# Patient Record
Sex: Female | Born: 1950
Health system: Southern US, Community
[De-identification: ages and names within clinical notes are randomized; demographics above are authoritative.]

## PROBLEM LIST (undated history)

## (undated) ENCOUNTER — Ambulatory Visit

## (undated) DIAGNOSIS — R0600 Dyspnea, unspecified: Secondary | ICD-10-CM

## (undated) DIAGNOSIS — E669 Obesity, unspecified: Secondary | ICD-10-CM

## (undated) DIAGNOSIS — D649 Anemia, unspecified: Secondary | ICD-10-CM

## (undated) DIAGNOSIS — M2550 Pain in unspecified joint: Secondary | ICD-10-CM

## (undated) DIAGNOSIS — I10 Essential (primary) hypertension: Secondary | ICD-10-CM

## (undated) DIAGNOSIS — C801 Malignant (primary) neoplasm, unspecified: Secondary | ICD-10-CM

## (undated) DIAGNOSIS — R002 Palpitations: Secondary | ICD-10-CM

## (undated) DIAGNOSIS — I38 Endocarditis, valve unspecified: Secondary | ICD-10-CM

## (undated) DIAGNOSIS — I4891 Unspecified atrial fibrillation: Secondary | ICD-10-CM

## (undated) DIAGNOSIS — E785 Hyperlipidemia, unspecified: Secondary | ICD-10-CM

## (undated) DIAGNOSIS — R600 Localized edema: Secondary | ICD-10-CM

## (undated) HISTORY — DX: Hyperlipidemia, unspecified: E78.5

## (undated) HISTORY — DX: Localized edema: R60.0

## (undated) HISTORY — DX: Unspecified atrial fibrillation: I48.91

## (undated) HISTORY — DX: Endocarditis, valve unspecified: I38

## (undated) HISTORY — DX: Palpitations: R00.2

## (undated) HISTORY — DX: Obesity, unspecified: E66.9

## (undated) HISTORY — DX: Anemia, unspecified: D64.9

## (undated) HISTORY — DX: Dyspnea, unspecified: R06.00

## (undated) HISTORY — DX: Pain in unspecified joint: M25.50

---

## 1984-03-19 HISTORY — PX: RADICAL ABDOMINAL HYSTERECTOMY: SUR659

## 2001-05-01 ENCOUNTER — Other Ambulatory Visit: Admission: RE | Admit: 2001-05-01 | Discharge: 2001-05-01 | Payer: Self-pay | Admitting: Obstetrics and Gynecology

## 2002-06-23 ENCOUNTER — Other Ambulatory Visit: Admission: RE | Admit: 2002-06-23 | Discharge: 2002-06-23 | Payer: Self-pay | Admitting: Obstetrics and Gynecology

## 2003-02-24 ENCOUNTER — Encounter: Admission: RE | Admit: 2003-02-24 | Discharge: 2003-02-24 | Payer: Self-pay | Admitting: Family Medicine

## 2003-07-21 ENCOUNTER — Other Ambulatory Visit: Admission: RE | Admit: 2003-07-21 | Discharge: 2003-07-21 | Payer: Self-pay | Admitting: Obstetrics and Gynecology

## 2003-11-01 ENCOUNTER — Ambulatory Visit (HOSPITAL_COMMUNITY): Admission: RE | Admit: 2003-11-01 | Discharge: 2003-11-01 | Payer: Self-pay | Admitting: Gastroenterology

## 2004-06-27 ENCOUNTER — Encounter: Admission: RE | Admit: 2004-06-27 | Discharge: 2004-06-27 | Payer: Self-pay | Admitting: Family Medicine

## 2004-08-15 ENCOUNTER — Other Ambulatory Visit: Admission: RE | Admit: 2004-08-15 | Discharge: 2004-08-15 | Payer: Self-pay | Admitting: Obstetrics and Gynecology

## 2004-11-23 ENCOUNTER — Encounter: Admission: RE | Admit: 2004-11-23 | Discharge: 2004-11-23 | Payer: Self-pay | Admitting: Family Medicine

## 2005-08-16 ENCOUNTER — Other Ambulatory Visit: Admission: RE | Admit: 2005-08-16 | Discharge: 2005-08-16 | Payer: Self-pay | Admitting: Obstetrics & Gynecology

## 2006-11-14 ENCOUNTER — Other Ambulatory Visit: Admission: RE | Admit: 2006-11-14 | Discharge: 2006-11-14 | Payer: Self-pay | Admitting: Obstetrics & Gynecology

## 2007-11-28 ENCOUNTER — Other Ambulatory Visit: Admission: RE | Admit: 2007-11-28 | Discharge: 2007-11-28 | Payer: Self-pay | Admitting: Obstetrics & Gynecology

## 2009-09-14 ENCOUNTER — Encounter: Admission: RE | Admit: 2009-09-14 | Discharge: 2009-09-21 | Payer: Self-pay | Admitting: Family Medicine

## 2010-08-04 NOTE — Op Note (Signed)
NAME:  Sav[place] Hill, Savannah Hill                      ACCOUNT NO.:  [id number]   MEDICAL RECORD NO.:  [id number]                   PATIENT TYPE:  AMB   LOCATION:  ENDO                                 FACILITY:  [facility]   PHYSICIAN:  Anselmo Rod, M.D.               DATE OF BIRTH:  Oct 05, 1950   DATE OF PROCEDURE:  11/01/2003  DATE OF DISCHARGE:                                 OPERATIVE REPORT   PROCEDURE:  Screening colonoscopy.   ENDOSCOPIST:  Anselmo Rod, M.D.   INSTRUMENT:  Olympus video colonoscope.   INDICATIONS FOR PROCEDURE:  A 60 year old white female with a personal  history of colon cancer and a family history of colon cancer, underwent  screening colonoscopy to rule out colonic polyps, masses, etc.   PRE-PROCEDURE PREPARATION:  Informed consent was procured from the patient.  Patient fasted for 8 hours prior to the procedure and prepped with a bottle  of magnesium citrate and a gallon of NuLytely the night prior to the  procedure.  Pre-procedure physical:  Patient had stable vital signs, neck  supple, chest clear to auscultation, S1/S2 regular, abdomen soft with normal  bowel sounds.   DESCRIPTION OF PROCEDURE:  The patient was placed in the left lateral  decubitus position, sedated with 80 mg of Demerol and 8 mg of Versed in  slow, incremental doses. Once the patient was adequately sedated and  maintained on low flow oxygen and continuous cardiac monitoring; the Olympus  video colonoscope was advanced from the rectum to the cecum.  The  appendiceal orifice and the ileocecal valve were clearly visualized and  photographed.  There was some residual stool in the colon, especially on the  right side.  Multiple washings were done.  No masses, polyps, erosions, diverticula or ulcerations were seen.  Small  internal hemorrhoids were appreciated on retroflexion in the rectum. Small  lesions could have been missed secondary to residual stool.   IMPRESSION:  1. Unrevealing  colonoscopy up to the cecum, except for small internal     hemorrhoids.  2. No masses, polyps or diverticula seen.  3. Significant amount of residual stool in the right colon, multiple     washings done.  Small lesions could have been missed.   RECOMMENDATIONS:  1. A repeat colonoscopy will therefore be done in an earlier date than 5     years unless the patient develops any abnormal symptoms in the interim.  2. Continue a high fiber diet with liberal fluids intake.  3. Outpatient follow up as needs arise in the future.  The patient has been     advised to report any abnormal GI bleeding and change in bowel habits,     weight loss, etc., to the office at the earliest.  Anselmo Rod, M.D.    JNM/MEDQ  D:  11/01/2003  T:  11/01/2003  Job:  [id number]   cc:   Bryan Lemma. Manus Gunning, M.D.  [address] [place]  [place] [address]  Fax: [phone]   Edwena Felty. Romine, M.D.  [address]  [address] [place]  [place] [address]  Fax: [phone]

## 2011-03-08 ENCOUNTER — Emergency Department (HOSPITAL_COMMUNITY): Payer: Commercial Indemnity

## 2011-03-08 ENCOUNTER — Encounter: Payer: Self-pay | Admitting: Emergency Medicine

## 2011-03-08 ENCOUNTER — Other Ambulatory Visit: Payer: Self-pay

## 2011-03-08 ENCOUNTER — Emergency Department (HOSPITAL_COMMUNITY)
Admission: EM | Admit: 2011-03-08 | Discharge: 2011-03-08 | Disposition: A | Discharge status: Home / Self Care | Payer: Commercial Indemnity | Attending: Emergency Medicine | Admitting: Emergency Medicine

## 2011-03-08 DIAGNOSIS — R079 Chest pain, unspecified: Secondary | ICD-10-CM | POA: Insufficient documentation

## 2011-03-08 DIAGNOSIS — I10 Essential (primary) hypertension: Secondary | ICD-10-CM | POA: Insufficient documentation

## 2011-03-08 DIAGNOSIS — Z79899 Other long term (current) drug therapy: Secondary | ICD-10-CM | POA: Insufficient documentation

## 2011-03-08 HISTORY — DX: Malignant (primary) neoplasm, unspecified: C80.1

## 2011-03-08 HISTORY — DX: Essential (primary) hypertension: I10

## 2011-03-08 LAB — DIFFERENTIAL
Basophils Absolute: 0 10*3/uL (ref 0.0–0.1)
Basophils Relative: 0 % (ref 0–1)
Eosinophils Absolute: 0.1 10*3/uL (ref 0.0–0.7)
Eosinophils Relative: 1 % (ref 0–5)
Lymphocytes Relative: 14 % (ref 12–46)
Lymphs Abs: 1.4 10*3/uL (ref 0.7–4.0)
Monocytes Absolute: 1.1 10*3/uL — ABNORMAL HIGH (ref 0.1–1.0)
Monocytes Relative: 10 % (ref 3–12)
Neutro Abs: 8 10*3/uL — ABNORMAL HIGH (ref 1.7–7.7)
Neutrophils Relative %: 75 % (ref 43–77)

## 2011-03-08 LAB — CBC
HCT: 38.6 % (ref 36.0–46.0)
Hemoglobin: 13 g/dL (ref 12.0–15.0)
MCH: 31.5 pg (ref 26.0–34.0)
MCHC: 33.7 g/dL (ref 30.0–36.0)
MCV: 93.5 fL (ref 78.0–100.0)
Platelets: 312 10*3/uL (ref 150–400)
RBC: 4.13 MIL/uL (ref 3.87–5.11)
RDW: 12.4 % (ref 11.5–15.5)
WBC: 10.6 10*3/uL — ABNORMAL HIGH (ref 4.0–10.5)

## 2011-03-08 LAB — D-DIMER, QUANTITATIVE: D-Dimer, Quant: 0.51 ug/mL-FEU — ABNORMAL HIGH (ref 0.00–0.48)

## 2011-03-08 LAB — COMPREHENSIVE METABOLIC PANEL
ALT: 6 U/L (ref 0–35)
AST: 20 U/L (ref 0–37)
Albumin: 4.1 g/dL (ref 3.5–5.2)
Alkaline Phosphatase: 67 U/L (ref 39–117)
BUN: 16 mg/dL (ref 6–23)
CO2: 26 mEq/L (ref 19–32)
Calcium: 9.6 mg/dL (ref 8.4–10.5)
Chloride: 98 mEq/L (ref 96–112)
Creatinine, Ser: 0.85 mg/dL (ref 0.50–1.10)
GFR calc Af Amer: 85 mL/min — ABNORMAL LOW (ref >90)
GFR calc non Af Amer: 73 mL/min — ABNORMAL LOW (ref >90)
Glucose, Bld: 88 mg/dL (ref 70–99)
Potassium: 3.5 mEq/L (ref 3.5–5.1)
Sodium: 136 mEq/L (ref 135–145)
Total Bilirubin: 0.3 mg/dL (ref 0.3–1.2)
Total Protein: 7.6 g/dL (ref 6.0–8.3)

## 2011-03-08 LAB — CARDIAC PANEL(CRET KIN+CKTOT+MB+TROPI)
CK, MB: 2.2 ng/mL (ref 0.3–4.0)
Relative Index: 1.1 (ref 0.0–2.5)
Total CK: 192 U/L — ABNORMAL HIGH (ref 7–177)
Troponin I: <0.3 ng/mL (ref <0.30)

## 2011-03-08 MED ORDER — SODIUM CHLORIDE 0.9 % IV SOLN: Freq: Once | INTRAVENOUS | Status: DC

## 2011-03-08 MED ORDER — METHOCARBAMOL 500 MG PO TABS: 500.0000 mg | ORAL_TABLET | Freq: Two times a day (BID) | ORAL | Status: AC

## 2011-03-08 MED ORDER — ASPIRIN 325 MG PO TABS
ORAL_TABLET | ORAL | Status: AC
  Administered 2011-03-08: 325 mg via OROMUCOSAL
  Filled 2011-03-08: qty 1

## 2011-03-08 MED ORDER — IBUPROFEN 600 MG PO TABS: 600.0000 mg | ORAL_TABLET | Freq: Four times a day (QID) | ORAL | Status: AC | PRN

## 2011-03-08 MED ORDER — IOHEXOL 300 MG/ML  SOLN
100.0000 mL | Freq: Once | INTRAMUSCULAR | Status: AC | PRN
  Administered 2011-03-08: 80 mL via INTRAVENOUS

## 2011-03-08 MED ORDER — ASPIRIN EC 325 MG PO TBEC
325.0000 mg | DELAYED_RELEASE_TABLET | Freq: Once | ORAL | Status: DC
  Filled 2011-03-08: qty 1

## 2011-03-08 NOTE — ED Notes (Signed)
Family at bedside. 

## 2011-03-08 NOTE — ED Notes (Signed)
Patient transported to CT 

## 2011-03-08 NOTE — ED Notes (Signed)
Patient denies pain and is resting comfortably.  

## 2011-03-08 NOTE — ED Notes (Signed)
Patient is resting comfortably. 

## 2011-03-08 NOTE — ED Notes (Signed)
Patient transported to X-ray 

## 2011-03-08 NOTE — ED Provider Notes (Signed)
History     CSN: [id number]  Arrival date & time 03/08/11  1629   First MD Initiated Contact with Patient 03/08/11 1654      No chief complaint on file.   (Consider location/radiation/quality/duration/timing/severity/associated sxs/prior treatment) The history is provided by the patient.   patient here with chest pain since 11:00 this morning. Substernal in nature has been constant. Worse with certain movements as well as the deep breathing. Denies any associated dyspnea, diaphoresis, cough, fever. None no exertional component to her symptoms. Denies any leg pain or swelling. No prior history of this in the past patient did take Xanax thinking that her symptoms were due to stress and there have been only for symptoms. Patient did exercise today and thinks that maybe the tightness was made worse by that however she was able to finish her workout and denied any associated dyspnea or diaphoresis with this.  Past Medical History  Diagnosis Date  . Hypertension   . Cancer     Past Surgical History  Procedure Date  . Abdominal hysterectomy     Family History  Problem Relation Age of Onset  . Diabetes Mother   . Hypertension Mother     History  Substance Use Topics  . Smoking status: Never Smoker   . Smokeless tobacco: Not on file  . Alcohol Use: Yes    OB History    Grav Para Term Preterm Abortions TAB SAB Ect Mult Living                  Review of Systems  All other systems reviewed and are negative.    Allergies  Review of patient's allergies indicates no known allergies.  Home Medications   Current Outpatient Rx  Name Route Sig Dispense Refill  . CONJ ESTROG-MEDROXYPROGEST ACE 0.625-2.5 MG PO TABS Oral Take 1 tablet by mouth daily.      [name] Kitchen HYDROCHLOROTHIAZIDE 12.5 MG PO CAPS Oral Take 12.5 mg by mouth daily.        BP 136/78  Pulse 67  Temp(Src) 97.6 F (36.4 C) (Oral)  Resp 18  SpO2 100%  Physical Exam  Nursing note and vitals  reviewed. Constitutional: She is oriented to person, place, and time. She appears well-developed and well-nourished.  Non-toxic appearance. No distress.  HENT:  Head: Normocephalic and atraumatic.  Eyes: Conjunctivae, EOM and lids are normal. Pupils are equal, round, and reactive to light.  Neck: Normal range of motion. Neck supple. No tracheal deviation present. No mass present.  Cardiovascular: Normal rate, regular rhythm and normal heart sounds.  Exam reveals no gallop.   No murmur heard. Pulmonary/Chest: Effort normal and breath sounds normal. No stridor. No respiratory distress. She has no decreased breath sounds. She has no wheezes. She has no rhonchi. She has no rales. She exhibits tenderness and bony tenderness. She exhibits no crepitus.    Abdominal: Soft. Normal appearance and bowel sounds are normal. She exhibits no distension. There is no tenderness. There is no rebound and no CVA tenderness.  Musculoskeletal: Normal range of motion. She exhibits no edema and no tenderness.  Neurological: She is alert and oriented to person, place, and time. She has normal strength. No cranial nerve deficit or sensory deficit. GCS eye subscore is 4. GCS verbal subscore is 5. GCS motor subscore is 6.  Skin: Skin is warm and dry. No abrasion and no rash noted.  Psychiatric: She has a normal mood and affect. Her speech is normal and behavior is normal.  ED Course  Procedures (including critical care time)   Labs Reviewed  CBC  DIFFERENTIAL  COMPREHENSIVE METABOLIC PANEL  CARDIAC PANEL(CRET KIN+CKTOT+MB+TROPI)  D-DIMER, QUANTITATIVE   No results found.   No diagnosis found.    MDM   Date: 03/08/2011  Rate: 67  Rhythm: normal sinus rhythm  QRS Axis: left  Intervals: normal  ST/T Wave abnormalities: normal  Conduction Disutrbances:none  Narrative Interpretation:   Old EKG Reviewed: none available    8:31 PM Pt had neg chest ct for pe, doubt acs, pain to palpation at mid  chest, suspect chest wall pain      Toy Baker, MD 03/08/11 2040

## 2011-03-08 NOTE — ED Notes (Signed)
Vital signs stable. 

## 2011-03-08 NOTE — ED Notes (Signed)
MD at bedside. 

## 2011-03-08 NOTE — ED Notes (Signed)
-    I.V. TEAM CALLED TO START A INT ON THIS PT.

## 2011-10-01 ENCOUNTER — Emergency Department (HOSPITAL_COMMUNITY): Payer: Commercial Indemnity

## 2011-10-01 ENCOUNTER — Observation Stay (HOSPITAL_COMMUNITY): Payer: Commercial Indemnity

## 2011-10-01 ENCOUNTER — Observation Stay (HOSPITAL_COMMUNITY)
Admission: EM | Admit: 2011-10-01 | Discharge: 2011-10-02 | Disposition: A | Discharge status: Home / Self Care | Payer: Commercial Indemnity | Attending: Internal Medicine | Admitting: Internal Medicine

## 2011-10-01 ENCOUNTER — Encounter (HOSPITAL_COMMUNITY): Payer: Self-pay | Admitting: *Deleted

## 2011-10-01 DIAGNOSIS — R0789 Other chest pain: Secondary | ICD-10-CM | POA: Insufficient documentation

## 2011-10-01 DIAGNOSIS — F1010 Alcohol abuse, uncomplicated: Secondary | ICD-10-CM

## 2011-10-01 DIAGNOSIS — I4891 Unspecified atrial fibrillation: Principal | ICD-10-CM

## 2011-10-01 DIAGNOSIS — I890 Lymphedema, not elsewhere classified: Secondary | ICD-10-CM | POA: Insufficient documentation

## 2011-10-01 DIAGNOSIS — R0602 Shortness of breath: Secondary | ICD-10-CM | POA: Insufficient documentation

## 2011-10-01 DIAGNOSIS — J8410 Pulmonary fibrosis, unspecified: Secondary | ICD-10-CM | POA: Insufficient documentation

## 2011-10-01 DIAGNOSIS — Z79899 Other long term (current) drug therapy: Secondary | ICD-10-CM | POA: Insufficient documentation

## 2011-10-01 DIAGNOSIS — R0609 Other forms of dyspnea: Secondary | ICD-10-CM | POA: Insufficient documentation

## 2011-10-01 DIAGNOSIS — Z8541 Personal history of malignant neoplasm of cervix uteri: Secondary | ICD-10-CM

## 2011-10-01 DIAGNOSIS — C539 Malignant neoplasm of cervix uteri, unspecified: Secondary | ICD-10-CM

## 2011-10-01 DIAGNOSIS — I10 Essential (primary) hypertension: Secondary | ICD-10-CM | POA: Insufficient documentation

## 2011-10-01 DIAGNOSIS — M7989 Other specified soft tissue disorders: Secondary | ICD-10-CM | POA: Diagnosis present

## 2011-10-01 DIAGNOSIS — I2510 Atherosclerotic heart disease of native coronary artery without angina pectoris: Secondary | ICD-10-CM | POA: Insufficient documentation

## 2011-10-01 DIAGNOSIS — R0989 Other specified symptoms and signs involving the circulatory and respiratory systems: Secondary | ICD-10-CM | POA: Insufficient documentation

## 2011-10-01 DIAGNOSIS — R079 Chest pain, unspecified: Secondary | ICD-10-CM | POA: Diagnosis present

## 2011-10-01 DIAGNOSIS — I079 Rheumatic tricuspid valve disease, unspecified: Secondary | ICD-10-CM | POA: Insufficient documentation

## 2011-10-01 DIAGNOSIS — Z23 Encounter for immunization: Secondary | ICD-10-CM | POA: Insufficient documentation

## 2011-10-01 DIAGNOSIS — Z90710 Acquired absence of both cervix and uterus: Secondary | ICD-10-CM | POA: Diagnosis present

## 2011-10-01 DIAGNOSIS — I517 Cardiomegaly: Secondary | ICD-10-CM | POA: Insufficient documentation

## 2011-10-01 LAB — POCT I-STAT, CHEM 8
BUN: 16 mg/dL (ref 6–23)
Creatinine, Ser: 1 mg/dL (ref 0.50–1.10)
Glucose, Bld: 104 mg/dL — ABNORMAL HIGH (ref 70–99)
Sodium: 138 mEq/L (ref 135–145)
TCO2: 28 mmol/L (ref 0–100)

## 2011-10-01 LAB — CBC WITH DIFFERENTIAL/PLATELET
Basophils Absolute: 0 10*3/uL (ref 0.0–0.1)
Basophils Relative: 0 % (ref 0–1)
Eosinophils Absolute: 0.1 10*3/uL (ref 0.0–0.7)
Eosinophils Relative: 1 % (ref 0–5)
Lymphs Abs: 1.9 10*3/uL (ref 0.7–4.0)
MCH: 31.6 pg (ref 26.0–34.0)
MCHC: 33.6 g/dL (ref 30.0–36.0)
MCV: 94.1 fL (ref 78.0–100.0)
Neutrophils Relative %: 72 % (ref 43–77)
Platelets: 334 10*3/uL (ref 150–400)
RDW: 13.1 % (ref 11.5–15.5)

## 2011-10-01 LAB — CBC
Hemoglobin: 11.3 g/dL — ABNORMAL LOW (ref 12.0–15.0)
MCH: 31.4 pg (ref 26.0–34.0)
MCHC: 33.4 g/dL (ref 30.0–36.0)
Platelets: 245 10*3/uL (ref 150–400)
RDW: 13.1 % (ref 11.5–15.5)

## 2011-10-01 LAB — CARDIAC PANEL(CRET KIN+CKTOT+MB+TROPI)
CK, MB: 1.5 ng/mL (ref 0.3–4.0)
CK, MB: 2.4 ng/mL (ref 0.3–4.0)
Troponin I: <0.3 ng/mL (ref <0.30)
Troponin I: <0.3 ng/mL (ref <0.30)

## 2011-10-01 LAB — CREATININE, SERUM: Creatinine, Ser: 0.74 mg/dL (ref 0.50–1.10)

## 2011-10-01 LAB — POCT I-STAT TROPONIN I

## 2011-10-01 LAB — PROTIME-INR: Prothrombin Time: 12.4 seconds (ref 11.6–15.2)

## 2011-10-01 LAB — APTT: aPTT: 25 seconds (ref 24–37)

## 2011-10-01 MED ORDER — DILTIAZEM HCL ER COATED BEADS 120 MG PO CP24
120.0000 mg | ORAL_CAPSULE | Freq: Every day | ORAL | Status: DC
  Administered 2011-10-01 – 2011-10-02 (×2): 120 mg via ORAL
  Filled 2011-10-01 (×2): qty 1

## 2011-10-01 MED ORDER — OXYCODONE-ACETAMINOPHEN 5-325 MG PO TABS
1.0000 | ORAL_TABLET | ORAL | Status: DC | PRN
  Administered 2011-10-01: 1 via ORAL
  Filled 2011-10-01: qty 1

## 2011-10-01 MED ORDER — METOPROLOL TARTRATE 25 MG PO TABS
25.0000 mg | ORAL_TABLET | Freq: Two times a day (BID) | ORAL | Status: DC
  Administered 2011-10-01: 25 mg via ORAL
  Filled 2011-10-01 (×2): qty 1

## 2011-10-01 MED ORDER — ADULT MULTIVITAMIN W/MINERALS CH
1.0000 | ORAL_TABLET | Freq: Every day | ORAL | Status: DC
  Administered 2011-10-01 – 2011-10-02 (×2): 1 via ORAL
  Filled 2011-10-01 (×2): qty 1

## 2011-10-01 MED ORDER — LORAZEPAM 1 MG PO TABS: 1.0000 mg | ORAL_TABLET | Freq: Four times a day (QID) | ORAL | Status: DC | PRN

## 2011-10-01 MED ORDER — LORAZEPAM 2 MG/ML IJ SOLN: 1.0000 mg | Freq: Four times a day (QID) | INTRAMUSCULAR | Status: DC | PRN

## 2011-10-01 MED ORDER — VITAMIN B-1 100 MG PO TABS
100.0000 mg | ORAL_TABLET | Freq: Every day | ORAL | Status: DC
  Administered 2011-10-01 – 2011-10-02 (×2): 100 mg via ORAL
  Filled 2011-10-01 (×2): qty 1

## 2011-10-01 MED ORDER — SODIUM CHLORIDE 0.9 % IV SOLN: Freq: Once | INTRAVENOUS | Status: DC

## 2011-10-01 MED ORDER — DILTIAZEM HCL 100 MG IV SOLR
5.0000 mg/h | INTRAVENOUS | Status: DC
  Administered 2011-10-01: 5 mg/h via INTRAVENOUS

## 2011-10-01 MED ORDER — ONDANSETRON HCL 4 MG/2ML IJ SOLN: 4.0000 mg | Freq: Four times a day (QID) | INTRAMUSCULAR | Status: DC | PRN

## 2011-10-01 MED ORDER — ONDANSETRON HCL 4 MG/2ML IJ SOLN: 4.0000 mg | Freq: Three times a day (TID) | INTRAMUSCULAR | Status: AC | PRN

## 2011-10-01 MED ORDER — CITALOPRAM HYDROBROMIDE 20 MG PO TABS
20.0000 mg | ORAL_TABLET | Freq: Every day | ORAL | Status: DC
  Administered 2011-10-01 – 2011-10-02 (×2): 20 mg via ORAL
  Filled 2011-10-01 (×2): qty 1

## 2011-10-01 MED ORDER — DILTIAZEM HCL 100 MG IV SOLR
5.0000 mg/h | Freq: Once | INTRAVENOUS | Status: AC
  Administered 2011-10-01: 5 mg/h via INTRAVENOUS
  Filled 2011-10-01 (×2): qty 100

## 2011-10-01 MED ORDER — SODIUM CHLORIDE 0.9 % IV SOLN: INTRAVENOUS | Status: AC

## 2011-10-01 MED ORDER — PNEUMOCOCCAL VAC POLYVALENT 25 MCG/0.5ML IJ INJ
0.5000 mL | INJECTION | INTRAMUSCULAR | Status: AC
  Administered 2011-10-02: 0.5 mL via INTRAMUSCULAR
  Filled 2011-10-01: qty 0.5

## 2011-10-01 MED ORDER — THIAMINE HCL 100 MG/ML IJ SOLN
100.0000 mg | Freq: Every day | INTRAMUSCULAR | Status: DC
  Filled 2011-10-01 (×2): qty 1

## 2011-10-01 MED ORDER — DILTIAZEM LOAD VIA INFUSION
20.0000 mg | Freq: Once | INTRAVENOUS | Status: AC
  Administered 2011-10-01: 10 mg via INTRAVENOUS

## 2011-10-01 MED ORDER — ENOXAPARIN SODIUM 40 MG/0.4ML ~~LOC~~ SOLN
40.0000 mg | SUBCUTANEOUS | Status: DC
  Administered 2011-10-01 – 2011-10-02 (×2): 40 mg via SUBCUTANEOUS
  Filled 2011-10-01 (×2): qty 0.4

## 2011-10-01 MED ORDER — THIAMINE HCL 100 MG/ML IJ SOLN
Freq: Once | INTRAVENOUS | Status: AC
  Administered 2011-10-01: 06:00:00 via INTRAVENOUS
  Filled 2011-10-01: qty 1000

## 2011-10-01 MED ORDER — NITROGLYCERIN 0.4 MG SL SUBL
0.4000 mg | SUBLINGUAL_TABLET | SUBLINGUAL | Status: DC | PRN
  Filled 2011-10-01: qty 25

## 2011-10-01 MED ORDER — IOHEXOL 350 MG/ML SOLN
100.0000 mL | Freq: Once | INTRAVENOUS | Status: AC | PRN
  Administered 2011-10-01: 100 mL via INTRAVENOUS

## 2011-10-01 MED ORDER — DILTIAZEM HCL 100 MG IV SOLR: 5.0000 mg/h | Freq: Once | INTRAVENOUS | Status: DC

## 2011-10-01 MED ORDER — LORAZEPAM 2 MG/ML IJ SOLN: 0.0000 mg | Freq: Four times a day (QID) | INTRAMUSCULAR | Status: DC

## 2011-10-01 MED ORDER — SODIUM CHLORIDE 0.9 % IJ SOLN
3.0000 mL | Freq: Two times a day (BID) | INTRAMUSCULAR | Status: DC
  Administered 2011-10-01: 3 mL via INTRAVENOUS

## 2011-10-01 MED ORDER — DOCUSATE SODIUM 100 MG PO CAPS
100.0000 mg | ORAL_CAPSULE | Freq: Two times a day (BID) | ORAL | Status: DC
  Administered 2011-10-01 – 2011-10-02 (×3): 100 mg via ORAL
  Filled 2011-10-01 (×4): qty 1

## 2011-10-01 MED ORDER — FOLIC ACID 1 MG PO TABS
1.0000 mg | ORAL_TABLET | Freq: Every day | ORAL | Status: DC
  Administered 2011-10-01 – 2011-10-02 (×2): 1 mg via ORAL
  Filled 2011-10-01 (×2): qty 1

## 2011-10-01 MED ORDER — LORAZEPAM 2 MG/ML IJ SOLN: 0.0000 mg | Freq: Two times a day (BID) | INTRAMUSCULAR | Status: DC

## 2011-10-01 MED ORDER — ONDANSETRON HCL 4 MG PO TABS: 4.0000 mg | ORAL_TABLET | Freq: Four times a day (QID) | ORAL | Status: DC | PRN

## 2011-10-01 MED ORDER — ASPIRIN 81 MG PO CHEW
324.0000 mg | CHEWABLE_TABLET | Freq: Once | ORAL | Status: AC
  Administered 2011-10-01: 324 mg via ORAL
  Filled 2011-10-01: qty 4

## 2011-10-01 MED ORDER — TEMAZEPAM 7.5 MG PO CAPS
7.5000 mg | ORAL_CAPSULE | Freq: Every evening | ORAL | Status: DC | PRN
  Administered 2011-10-01: 7.5 mg via ORAL
  Filled 2011-10-01: qty 1

## 2011-10-01 MED ORDER — MORPHINE SULFATE 2 MG/ML IJ SOLN
2.0000 mg | INTRAMUSCULAR | Status: DC | PRN
  Administered 2011-10-01 (×2): 2 mg via INTRAVENOUS
  Filled 2011-10-01 (×2): qty 1

## 2011-10-01 NOTE — Progress Notes (Signed)
Pt has had 2 more 2 second pauses. Asymptomatic, HR in the 60s, last BP 98/68. Will address with MD on rounding. Julio Sicks RN

## 2011-10-01 NOTE — ED Provider Notes (Signed)
History     CSN: [id number]  Arrival date & time 10/01/11  4098   First MD Initiated Contact with Patient 10/01/11 0109      Chief Complaint  Patient presents with  . Chest Pain    (Consider location/radiation/quality/duration/timing/severity/associated sxs/prior treatment) HPI Comments: 61 year old female with a history of hypertension who presents with a complaint of chest pain which he stated started this morning. It is a pressure on her chest, associated with palpitations, persistent, moderate, no complaints of nausea vomiting shortness of breath cough fevers chills. She is under significant stress at work and is using increased alcohol, she had 6 cocktails this evening with dinner and has been drinking heavily every night. She denies any other drugs of abuse, no history of thyroid dysfunction.  Patient is a 61 y.o. female presenting with chest pain. The history is provided by the patient and the spouse.  Chest Pain     Past Medical History  Diagnosis Date  . Hypertension   . Cancer     Past Surgical History  Procedure Date  . Abdominal hysterectomy     Family History  Problem Relation Age of Onset  . Diabetes Mother   . Hypertension Mother     History  Substance Use Topics  . Smoking status: Never Smoker   . Smokeless tobacco: Not on file  . Alcohol Use: Yes    OB History    Grav Para Term Preterm Abortions TAB SAB Ect Mult Living                  Review of Systems  Cardiovascular: Positive for chest pain.  All other systems reviewed and are negative.    Allergies  Review of patient's allergies indicates no known allergies.  Home Medications   Current Outpatient Rx  Name Route Sig Dispense Refill  . ALPRAZOLAM 0.5 MG PO TABS Oral Take 0.5 mg by mouth daily as needed. ANXIETY     . CONJ ESTROG-MEDROXYPROGEST ACE 0.625-2.5 MG PO TABS Oral Take 1 tablet by mouth daily.      [name] Kitchen HYDROCHLOROTHIAZIDE 12.5 MG PO CAPS Oral Take 12.5 mg by mouth daily.       Marygrace Drought WOMENS PO Oral Take 1 tablet by mouth daily.      [name] Kitchen PRESCRIPTION MEDICATION Oral Take 1 tablet by mouth daily.        BP 82/60  Pulse 69  Temp 97.4 F (36.3 C) (Oral)  Resp 18  Ht 5\' 3"  (1.6 m)  Wt 170 lb (77.111 kg)  BMI 30.11 kg/m2  SpO2 100%  Physical Exam  Nursing note and vitals reviewed. Constitutional: She appears well-developed and well-nourished. No distress.  HENT:  Head: Normocephalic and atraumatic.  Mouth/Throat: Oropharynx is clear and moist. No oropharyngeal exudate.  Eyes: Conjunctivae and EOM are normal. Pupils are equal, round, and reactive to light. Right eye exhibits no discharge. Left eye exhibits no discharge. No scleral icterus.  Neck: Normal range of motion. Neck supple. No JVD present. No thyromegaly present.  Cardiovascular: Normal heart sounds and intact distal pulses.  Exam reveals no gallop and no friction rub.   No murmur heard.      Atrial fibrillation, normal pulses at the radial arteries, normal capillary refill, no jugular venous distention  Pulmonary/Chest: Effort normal and breath sounds normal. No respiratory distress. She has no wheezes. She has no rales.  Abdominal: Soft. Bowel sounds are normal. She exhibits no distension and no mass. There is no tenderness.  Musculoskeletal:  Normal range of motion. She exhibits no edema and no tenderness.  Lymphadenopathy:    She has no cervical adenopathy.  Neurological: She is alert. Coordination normal.  Skin: Skin is warm and dry. No rash noted. No erythema.  Psychiatric: She has a normal mood and affect. Her behavior is normal.    ED Course  Procedures (including critical care time)  Labs Reviewed  CBC WITH DIFFERENTIAL - Abnormal; Notable for the following:    WBC 11.9 (*)     Neutro Abs 8.6 (*)     Monocytes Absolute 1.3 (*)     All other components within normal limits  POCT I-STAT, CHEM 8 - Abnormal; Notable for the following:    Glucose, Bld 104 (*)     All other  components within normal limits  APTT  PROTIME-INR  POCT I-STAT TROPONIN I  TSH   Dg Chest Port 1 View  10/01/2011  *RADIOLOGY REPORT*  Clinical Data: New onset atrial fibrillation  PORTABLE CHEST - 1 VIEW  Comparison: CT 03/08/2011 and chest radiograph 03/08/2011  Findings: Calcified granulomata again noted.  Cardiac leads appear detail.  Heart size is upper limits of normal which could be accentuated by AP portable semi erect positioning and technique. No new focal pulmonary opacity.  No pleural effusion.  IMPRESSION: No new focal acute finding.  Original Report Authenticated By: Harrel Lemon, M.D.     1. Atrial fibrillation       MDM  EKG shows atrial fibrillation, compared to prior EKG this is new, I suspect this is related in someway to her alcohol use which has increased significantly over the last several months. She is having chest pain dust or bone it is ordered, labs ordered, will give Cardizem to reduce rate, no signs of alcohol withdrawal.  ED ECG REPORT  I personally interpreted this EKG   Date: 10/01/2011 0107  Rate: 99  Rhythm: atrial fibrillation  QRS Axis: left  Intervals: normal  ST/T Wave abnormalities: nonspecific T wave changes  Conduction Disutrbances:none  Narrative Interpretation:   Old EKG Reviewed: Compared with 03/08/2011, atrial fibrillation has replaced normal sinus rhythm with first degree AV block.   Patient has been given 10 mg of Cardizem, had reduced heart rate down into the 70-90 range but persistent atrial fibrillation. Repeat EKG shows no significant changes other than rate. Labs show that the patient has a leukocytosis of 11,900, normal potassium and renal function, normal troponin. Chest x-ray reviewed, no focal findings. Patient does have some hypotension after the Cardizem has been receiving fluids. She has persistent chest pain. I discussed her care with the [facility] hospitalist to admit her for further evaluation  ED ECG REPORT  I  personally interpreted this EKG   Date: 10/01/2011 0204  Rate: 87  Rhythm: atrial fibrillation  QRS Axis: left  Intervals: normal  ST/T Wave abnormalities: nonspecific T wave changes  Conduction Disutrbances:none  Narrative Interpretation:   Old EKG Reviewed: Rate is reduced compared with prior      Vida Roller, MD 10/01/11 [id number]
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
## 2011-10-01 NOTE — Progress Notes (Signed)
I have seen and examined pt admitted this am per Dr Kary Kos of cervical cancer s/p total hysterectomy many years ago, with resulting chronic bilateral lymphadema, on ERT, chronic alcohol use (significant), HTN, presents to the ER with 1 day hx of sudden onset of substernal and pleuritic CP, mild shortness of breath, no fever,chills, or coughs. Evaluation in the ER showed that she was in atrial fibrillation with RVR (New onset, CHADS2=ZERO), With CXR showed no infiltrate, CTA showed old granulomatous disease and interstial fibrosis-neg for PE. She conitnues to have chest pain today, reports increased work related stress. Pt on exam remains in afib with controlled rate on cardizem drip, I have consulted cards for further recommendations.  Donnalee Curry [facility] hospitalist [phone]

## 2011-10-01 NOTE — H&P (Signed)
[facility] History and Physical  DEVEN AUDI WUJ:[id number] DOB: [birth year]    PCP:   Suzzette Righter,  Deboraha Sprang.  Chief Complaint: chest tightness, palpitation   HPI: Savannah Hill is an 61 y.o. female with hx of cervical cancer s/p total hysterectomy many years ago, with resulting chronic bilateral lymphadema, on ERT, chronic alcohol use (significant), HTN, presents to the ER with 1 day hx of sudden onset of substernal and pleuritic CP, mild shortness of breath, no fever,chills, or coughs.  Evaluation in the ER showed that she was in atrial fibrillation with RVR (New onset, CHADS2=ZERO),  With CXR showed no infiltrate, CTPA showed old granulomatous disease and normal Cr.  Her K is normal, and her Hb was 14.6 g/DL.  She was given one dose of IV Cardiazem, and her rate normalized to 80's, but did drop her BP to 70's transciently, reponded to fluid to SBP of 107.  She has had no calf tenderness, abdominal pain or cramps, or any hx of exertional CP. She admitted to undergoing significant stress with her previous work place.  Rewiew of Systems:  Constitutional: Negative for malaise, fever and chills. No significant weight loss or weight gain Eyes: Negative for eye pain, redness and discharge, diplopia, visual changes, or flashes of light. ENMT: Negative for ear pain, hoarseness, nasal congestion, sinus pressure and sore throat. No headaches; tinnitus, drooling, or problem swallowing. Cardiovascular: Negative for diaphoresis, dyspnea and peripheral edema. ; No orthopnea, PND Respiratory: Negative for cough, hemoptysis, wheezing and stridor. No pleuritic chestpain. Gastrointestinal: Negative for nausea, vomiting, diarrhea, constipation, abdominal pain, melena, blood in stool, hematemesis, jaundice and rectal bleeding.    Genitourinary: Negative for frequency, dysuria, incontinence,flank pain and hematuria; Musculoskeletal: Negative for back pain and neck pain. Negative trauma.; She has  chronic nonpitting leg edema Skin: . Negative for pruritus, rash, abrasions, bruising and skin lesion.; ulcerations Neuro: Negative for headache, lightheadedness and neck stiffness. Negative for weakness, altered level of consciousness , altered mental status, extremity weakness, burning feet, involuntary movement, seizure and syncope.  Psych: negative for insomnia, tearfulness, panic attacks, hallucinations, paranoia, suicidal or homicidal ideation     Past Medical History  Diagnosis Date  . Hypertension   . Cancer     Past Surgical History  Procedure Date  . Abdominal hysterectomy     Medications:  HOME MEDS: Prior to Admission medications   Medication Sig Start Date End Date Taking? Authorizing Provider  ALPRAZolam Prudy Feeler) 0.5 MG tablet Take 0.5 mg by mouth daily as needed. ANXIETY   Yes Historical Provider, MD  citalopram (CELEXA) 20 MG tablet Take 20 mg by mouth daily.   Yes Historical Provider, MD  estrogen, conjugated,-medroxyprogesterone (PREMPRO) 0.625-2.5 MG per tablet Take 1 tablet by mouth daily.     Yes Historical Provider, MD  hydrochlorothiazide (MICROZIDE) 12.5 MG capsule Take 12.5 mg by mouth daily.     Yes Historical Provider, MD  Multiple Vitamin (MULTIVITAMIN WITH MINERALS) TABS Take 1 tablet by mouth daily.   Yes Historical Provider, MD     Allergies:  No Known Allergies  Social History:   reports that she has never smoked. She does not have any smokeless tobacco history on file. She reports that she drinks alcohol. Her drug history not on file.  Family History: Family History  Problem Relation Age of Onset  . Diabetes Mother   . Hypertension Mother      Physical Exam: Filed Vitals:   10/01/11 0108 10/01/11 0225 10/01/11 0248  BP: 158/98  82/60 104/67  Pulse: 103 69 79  Temp: 97.4 F (36.3 C)    TempSrc: Oral    Resp: 18 18 16   Height: 5\' 3"  (1.6 m)    Weight: 77.111 kg (170 lb)    SpO2: 97% 100% 100%   Blood pressure 104/67, pulse 79,  temperature 97.4 F (36.3 C), temperature source Oral, resp. rate 16, height 5\' 3"  (1.6 m), weight 77.111 kg (170 lb), SpO2 100.00%.  GEN:  Pleasant  patient lying in the stretcher in no acute distress; cooperative with exam. PSYCH:  alert and oriented x4; does not appear anxious or depressed; affect is appropriate. HEENT: Mucous membranes pink and anicteric; PERRLA; EOM intact; no cervical lymphadenopathy nor thyromegaly or carotid bruit; no JVD; There were no stridor. Neck is very supple. Breasts:: Not examined CHEST WALL: No tenderness CHEST: Normal respiration, clear to auscultation bilaterally.  HEART: Irregular rhythm with controlled rate.  There are no murmur, rub, or gallops.   BACK: No kyphosis or scoliosis; no CVA tenderness ABDOMEN: soft and non-tender; no masses, no organomegaly, normal abdominal bowel sounds; no pannus; no intertriginous candida. There is no rebound and no distention. Rectal Exam: Not done EXTREMITIES: No bone or joint deformity; age-appropriate arthropathy of the hands and knees; no edema; no ulcerations.  There is no calf tenderness. Genitalia: not examined PULSES: 2+ and symmetric SKIN: Normal hydration no rash or ulceration CNS: Cranial nerves 2-12 grossly intact no focal lateralizing neurologic deficit.  Speech is fluent; uvula elevated with phonation, facial symmetry and tongue midline. DTR are normal bilaterally, cerebella exam is intact, barbinski is negative and strengths are equaled bilaterally.  No sensory loss.   Labs on Admission:  Basic Metabolic Panel:  Lab 10/01/11 1478  NA 138  K 3.8  CL 99  CO2 --  GLUCOSE 104*  BUN 16  CREATININE 1.00  CALCIUM --  MG --  PHOS --   Liver Function Tests: No results found for this basename: AST:5,ALT:5,ALKPHOS:5,BILITOT:5,PROT:5,ALBUMIN:5 in the last 168 hours No results found for this basename: LIPASE:5,AMYLASE:5 in the last 168 hours No results found for this basename: AMMONIA:5 in the last 168  hours CBC:  Lab 10/01/11 0152 10/01/11 0130  WBC -- 11.9*  NEUTROABS -- 8.6*  HGB 14.6 12.8  HCT 43.0 38.1  MCV -- 94.1  PLT -- 334   Cardiac Enzymes: No results found for this basename: CKTOTAL:5,CKMB:5,CKMBINDEX:5,TROPONINI:5 in the last 168 hours  CBG: No results found for this basename: GLUCAP:5 in the last 168 hours   Radiological Exams on Admission: Ct Angio Chest W/cm &/or Wo Cm  10/01/2011  *RADIOLOGY REPORT*  Clinical Data: Chest pain for 1 day.  CT ANGIOGRAPHY CHEST  Technique:  Multidetector CT imaging of the chest using the standard protocol during bolus administration of intravenous contrast. Multiplanar reconstructed images including MIPs were obtained and reviewed to evaluate the vascular anatomy.  Contrast: OMNIPAQUE IOHEXOL 350 MG/ML SOLN  Comparison: 03/08/2011  Findings: Technically adequate study with good opacification of the central and segmental pulmonary arteries.  No focal filling defects.  No evidence of significant pulmonary embolus.  Normal caliber thoracic aorta.  Scattered coronary artery calcifications.  Mild cardiac enlargement.  The esophagus is mostly decompressed.  No significant lymphadenopathy in the chest.  Focal gas collections in the soft tissues of the lower neck anteriorly probably represent venous gas relating to intravenous injection. Calcified granulomas in the spleen.  No pleural effusions.  Interstitial scarring in the lung bases and peripheral zones demonstrating some  progression since the previous study.  Calcified granulomas in the lungs.  No focal airspace consolidation.  No pneumothorax.  Airways appear patent.  Degenerative changes in the thoracic spine.  IMPRESSION: No evidence of significant pulmonary embolus.  Calcified granulomas and interstitial fibrosis likely representing postinflammatory change.  No focal consolidation.  No acute process identified.  Original Report Authenticated By: Marlon Pel, M.D.   Dg Chest Port 1  View  10/01/2011  *RADIOLOGY REPORT*  Clinical Data: New onset atrial fibrillation  PORTABLE CHEST - 1 VIEW  Comparison: CT 03/08/2011 and chest radiograph 03/08/2011  Findings: Calcified granulomata again noted.  Cardiac leads appear detail.  Heart size is upper limits of normal which could be accentuated by AP portable semi erect positioning and technique. No new focal pulmonary opacity.  No pleural effusion.  IMPRESSION: No new focal acute finding.  Original Report Authenticated By: Harrel Lemon, M.D.    EKG: afib with RVR but no acute ST T changes.   Assessment/Plan Present on Admission:  .New onset a-fib .Chest pain at rest .Alcohol abuse .Leg swelling .H/O total hysterectomy   PLAN:  Will admit to telemetry.  She has no indication for anticoagulation due to the fact that her onset was rather clear and only about 24 hours. She also has a CHADS score of 0, negating longterm anticoagulation.  I suspect that it is alcohol induced, and I suggest that she stop using alcohol.  Will obtain an ECHO of her heart, and cycle her cardiac markers. She was given ASA, and I started her on a low dose Lopressor.  She is on a low drip Cardizem because her BP has been on the low side.  She will be at risk for alcohol withdrawal, and was placed on CIWA with IV Ativan.  It is reassuring that her CTPA was negative, as she had lymphadema, on ERT, and presented with new onset of afib with chest pain.  She will be admitted to telemetry under [facility], She is stable, full code, and will be admitted to telemetry under [facility].  Please consult cardiology if she doesn't spontaneously convert to NSR.   Other plans as per orders.  Code Status: FULL.   Houston Siren, MD. [facility] Pager [phone] 7pm to 7am.  10/01/2011, 4:31 AM

## 2011-10-01 NOTE — Progress Notes (Signed)
  Echocardiogram 2D Echocardiogram has been performed.  Savannah Hill 10/01/2011, 9:54 AM

## 2011-10-01 NOTE — ED Notes (Signed)
Attempted to give a nitroglycerin tablet but pt's BP was 82/60 and therefore held the medication.

## 2011-10-01 NOTE — Progress Notes (Signed)
Pt has had two 2 second pauses. Asymptomatic and VS stable. MD notified. Will remain on cardizem drip for now. MD will further address on rounding. Will continue to monitor. Julio Sicks RN

## 2011-10-01 NOTE — ED Notes (Signed)
Pt sts she awoke to chest tightness this morning that has gotten worse over the day. Took 1/2 xanax earlier tonight, approximately 1930 to help with her racing heart. No lightheadedness, or dizziness.

## 2011-10-02 DIAGNOSIS — Z8541 Personal history of malignant neoplasm of cervix uteri: Secondary | ICD-10-CM

## 2011-10-02 LAB — BASIC METABOLIC PANEL
BUN: 13 mg/dL (ref 6–23)
GFR calc non Af Amer: 77 mL/min — ABNORMAL LOW (ref >90)
Glucose, Bld: 94 mg/dL (ref 70–99)
Potassium: 4.1 mEq/L (ref 3.5–5.1)

## 2011-10-02 LAB — CBC
HCT: 30.3 % — ABNORMAL LOW (ref 36.0–46.0)
Hemoglobin: 10 g/dL — ABNORMAL LOW (ref 12.0–15.0)
MCHC: 33 g/dL (ref 30.0–36.0)
RBC: 3.18 MIL/uL — ABNORMAL LOW (ref 3.87–5.11)

## 2011-10-02 LAB — CARDIAC PANEL(CRET KIN+CKTOT+MB+TROPI)
Relative Index: INVALID (ref 0.0–2.5)
Troponin I: 0.49 ng/mL (ref <0.30)

## 2011-10-02 MED ORDER — ASPIRIN 81 MG PO TBEC: 81.0000 mg | DELAYED_RELEASE_TABLET | Freq: Every day | ORAL | Status: AC

## 2011-10-02 MED ORDER — DILTIAZEM HCL ER COATED BEADS 120 MG PO CP24: 120.0000 mg | ORAL_CAPSULE | Freq: Every day | ORAL | Status: DC

## 2011-10-02 MED ORDER — FOLIC ACID 1 MG PO TABS: 1.0000 mg | ORAL_TABLET | Freq: Every day | ORAL | Status: AC

## 2011-10-02 MED ORDER — THIAMINE HCL 100 MG PO TABS: 100.0000 mg | ORAL_TABLET | Freq: Every day | ORAL | Status: AC

## 2011-10-02 NOTE — Progress Notes (Signed)
Patient discharged home with husband, discharge instructions given and explained to patient and she verbalized understanding, denies any pain or distress. Skin intact, no wound. Accompanied home by husband.

## 2011-10-02 NOTE — Progress Notes (Signed)
CRITICAL VALUE ALERT  Critical value received:  Trop 0.54  Date of notification:  10/01/11  Time of notification:  2311  Critical value read back:yes  Nurse who received alert:  K. Dogra taken for K. Lakesa Coste  MD notified (1st page):  [facility]  Time of first page:  0005  MD notified (2nd page):  Time of second page:  Responding MD:  Hochrein  Time MD responded:  0010  This was patient's 3rd set of enzymes, previous enzymes were negative. Pt c/o same mid sternal chest pain with inspiration at beginning of shift. Relieved with percocet. Pt with no c/o pain at present time. Pt is, however, very diaphoretic- had to change pt's gown. EKG done. Pt's CIWA scale at 2130 was a 2, no signs of withdrawel. [facility] and informed Dr Little Sturgeon Lions of above events. No interventions at present time but repeat enzymes again in am. Will continue to monitor closely through the night.   Braylyn Kalter, Ok Edwards RN

## 2011-10-02 NOTE — Progress Notes (Signed)
Subjective:  Last night, her third set of cardiac markers demonstrated a troponin of 0.59, mildly elevated with CK of 62, MB of 2.4 and then at 4 AM this morning her troponin was 0.49, decreased with a CK of 75 and MB of 3.3. Her first 2 troponins were normal. She's not having any chest discomfort. She did state that upon inspiration she had some achiness/sharp chest discomfort upon arrival to the hospital. Several months ago she was admitted with a similar type of chest discomfort that was pleuritic in nature.  She is having no difficulty with breathing. The nurse last night noted that she was mildly diaphoretic which may have been secondary to signs of alcohol withdrawal.  Her EKG shows sinus bradycardia with no ST segment changes, old inferior infarct pattern. Echocardiogram did not show wall motion abnormality. Normal ejection fraction. Her prior EKG when compared demonstrates atrial fibrillation with inferior infarct pattern as well.   Objective:  Vital Signs in the last 24 hours: Temp:  [97.4 F (36.3 C)-98.6 F (37 C)] 97.5 F (36.4 C) (07/16 0543) Pulse Rate:  [53-62] 53  (07/16 0543) Resp:  [16-18] 16  (07/16 0543) BP: (92-107)/(61-73) 107/73 mmHg (07/16 0543) SpO2:  [94 %-98 %] 96 % (07/16 0543)  Intake/Output from previous day: 07/15 0701 - 07/16 0700 In: 1660 [P.O.:720; I.V.:940] Out: 1100 [Urine:1100]   Physical Exam: General: Well developed, well nourished, in no acute distress. Head:  Normocephalic and atraumatic. Lungs: Clear to auscultation and percussion. Heart: Normal S1 and S2.  No murmur, no rubs or gallops.  Abdomen: soft, non-tender, positive bowel sounds. Extremities: She has lymphedema bilateral lower extremities Neurologic: Alert and oriented x 3.    Lab Results:  Basename 10/02/11 0530 10/01/11 0630  WBC 5.5 9.1  HGB 10.0* 11.3*  PLT 234 245    Basename 10/02/11 0436 10/01/11 0630 10/01/11 0152  NA 132* -- 138  K 4.1 -- 3.8  CL 99 -- 99  CO2  22 -- --  GLUCOSE 94 -- 104*  BUN 13 -- 16  CREATININE 0.81 0.74 --    Basename 10/02/11 0436 10/01/11 2210  TROPONINI 0.49* 0.59*  Imaging: Ct Angio Chest W/cm &/or Wo Cm  10/01/2011  *RADIOLOGY REPORT*  Clinical Data: Chest pain for 1 day.  CT ANGIOGRAPHY CHEST  Technique:  Multidetector CT imaging of the chest using the standard protocol during bolus administration of intravenous contrast. Multiplanar reconstructed images including MIPs were obtained and reviewed to evaluate the vascular anatomy.  Contrast: OMNIPAQUE IOHEXOL 350 MG/ML SOLN  Comparison: 03/08/2011  Findings: Technically adequate study with good opacification of the central and segmental pulmonary arteries.  No focal filling defects.  No evidence of significant pulmonary embolus.  Normal caliber thoracic aorta.  Scattered coronary artery calcifications.  Mild cardiac enlargement.  The esophagus is mostly decompressed.  No significant lymphadenopathy in the chest.  Focal gas collections in the soft tissues of the lower neck anteriorly probably represent venous gas relating to intravenous injection. Calcified granulomas in the spleen.  No pleural effusions.  Interstitial scarring in the lung bases and peripheral zones demonstrating some progression since the previous study.  Calcified granulomas in the lungs.  No focal airspace consolidation.  No pneumothorax.  Airways appear patent.  Degenerative changes in the thoracic spine.  IMPRESSION: No evidence of significant pulmonary embolus.  Calcified granulomas and interstitial fibrosis likely representing postinflammatory change.  No focal consolidation.  No acute process identified.  Original Report Authenticated By: Marlon Pel,  M.D.   Dg Chest Port 1 View  10/01/2011  *RADIOLOGY REPORT*  Clinical Data: New onset atrial fibrillation  PORTABLE CHEST - 1 VIEW  Comparison: CT 03/08/2011 and chest radiograph 03/08/2011  Findings: Calcified granulomata again noted.  Cardiac leads  appear detail.  Heart size is upper limits of normal which could be accentuated by AP portable semi erect positioning and technique. No new focal pulmonary opacity.  No pleural effusion.  IMPRESSION: No new focal acute finding.  Original Report Authenticated By: Harrel Lemon, M.D.   Personally viewed.   Telemetry: Conversion to sinus rhythm/sinus bradycardia with conversion pause. Personally viewed.   EKG:  As above described  Cardiac Studies:  Echocardiogram reassuring with normal EF, no wall motion abnormalities  Assessment/Plan:  Principal Problem:  *New onset a-fib Active Problems:  Chest pain at rest  Alcohol abuse  Leg swelling  Hx of cervical cancer  H/O total hysterectomy  1. Atrial fibrillation-paroxysmal. Risk for is 0. Likely exacerbating etiology is alcohol. We discussed alcohol cessation at length. 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 She admittedly understands that she is self-medicating because of the increased stress but she is drinking excessive amounts of alcohol. I explained to her that alcohol cessation is extremely important for her in order to maintain sinus rhythm the best she can. I also explained to her that even with alcohol cessation, there is a chance for atrial fibrillation in the future. She does take Xanax occasionally at home and she feels palpitations. This is not unreasonable. She is to continue with low-dose diltiazem 120 mg once a day.  2. Mildly elevated troponin-in the setting of normal CK and MB, minimally elevated and trending downward. Perhaps this is from a very mild amount of demand ischemia in the setting of atrial fibrillation with rapid ventricular response. She does not have a pulmonary embolism based on CT scan. This may also be secondary to mild pericardial inflammation perhaps given her pleuritic component to her chest discomfort, i.e. worse with deep inspiration. Her EKG does not demonstrate any ischemic changes, no  signs of pericarditis, echocardiogram did not show any evidence of pericardial effusion. She is currently chest pain-free. I will set her up for a nuclear stress test to further evaluate for ischemia in the setting. This will be done on Wednesday a.m. at [facility] cardiology office. I am comfortable with her being discharged at this point given her clinical stability, conversion to sinus rhythm.  3. Chest pain-atypical, pleuritic-like worse with deep inspiration. CT scan, echocardiogram, EKG unremarkable. Minimally elevated troponin of 0.49 currently. Encourage aspirin use 81 mg at home. Short course of ibuprofen would not be unreasonable. I will also check a nuclear stress test to ensure that she does not have any signs of ischemia. I also discussed with her the possibility of cardiac catheterization which would not be unreasonable in this setting however proceeding with noninvasive evaluation given her normal ejection fraction, normal MB, unremarkable EKG was discussed and this is the route we mutually agreed upon. She knows to contact me or seek medical attention immediately if symptoms worsen or become more worrisome.  4. alcohol use-discuss cessation. This will be essential for her given her atrial fibrillation.  We have close followup established with her.  SKAINS, MARK 10/02/2011, 8:39 AM

## 2011-10-02 NOTE — Discharge Summary (Signed)
Physician Discharge Summary  Savannah Hill ZDG:[id number] DOB: 1950/08/07 DOA: 10/01/2011  PCP: No primary provider on file.  Admit date: 10/01/2011 Discharge date: 10/02/2011  Recommendations for Outpatient Follow-up:  Follow-up Information    Follow up with [name], MD on 10/04/2011. (8:00am stress test)    Contact information:   [address] [place] [address] [phone]       Follow up with [name], NP on 10/10/2011. (8:30am )    Contact information:   [facility] [address] [place] [address] [phone]          Discharge Diagnoses:  Principal Problem:  *New onset a-fib Active Problems:  Chest pain at rest  Alcohol abuse  Leg swelling  Hx of cervical cancer  H/O total hysterectomy   Discharge Condition: Improved/stable  Diet recommendation: 2 g sodium heart healthy  History of present illness:  The patient is a 61 year old female with history of cervical cancer s/p total hysterectomy many years ago, with resulting chronic bilateral lymphadema, on ERT, chronic alcohol use (significant), HTN, who presented to the ER with 1 day hx of sudden onset of substernal and pleuritic CP, mild shortness of breath, no fever,chills, or coughs. Evaluation in the ER showed that she was in atrial fibrillation with RVR (New onset, CHADS2=ZERO), With CXR showed no infiltrate, CTA showed old granulomatous disease and interstial fibrosis-neg for PE. She reported ,  increased work related stress. She was admitted for further evaluation and management. And followup this a.m. she is alert and oriented x3 feels much better  Hospital Course by problem list:  Present on Admission:  .New onset a-fib Upon admission the patient was placed on a Cardizem drip for rate control along with a low dose of beta blocker. Great enzymes were cycled and her third troponin was noted to be elevated at 0.59  and a followup troponin this a.m. 0.49. A 2-D echocardiogram was done and showed an ejection fraction of 55-60% with no wall motion abnormalities reported. The patient had been having chest pain with pleuritic component and a CT angiogram was done and came back negative for pulmonary embolus. Cardiology was consulted and Dr. Anne Fu saw the patient and she was changed to  long acting oral Cardizem, overnight she spontaneously conerted to normal sinus rhythm. Per Dr. Anne Fu patient can be discharge from from his standpoint even with the mildly elevated troponins is a him given that his EKG shows no ST segment changes, echocardiogram shows no wall motion abnormality-states that the mild troponin leak is possibly from the A. fib with RVR on admission. Patient is clinically improved at this time and medically stable for discharge on oral Cardizem and aspirin, for outpatient followup. She has been scheduled for a stress test per Dr. Anne Fu on Thursday 7/18. .Chest pain at rest -As discussed above, outpatient stress test on 7/18  .Alcohol abuse -Patient was on Ativan detox protocol and shows no signs of which all at this time. She has been counseled extensively to quit alcohol, and she agrees to  And states she has  good support -with her husband and church  .Leg swelling/lymphedema -follow up outpatient.  .H/O total hysterectomy   Procedures: Study Conclusions  - Left ventricle: The cavity size was normal. Systolic function was normal. The estimated ejection fraction was in the range of 55% to 60%. Wall motion was normal; there were no regional wall motion abnormalities. - Mitral valve: Mild regurgitation.  Consultations:  Cardiology, Dr. Serena Croissant  Discharge Exam: Filed Vitals:   10/02/11 0543  BP: 107/73  Pulse: 53  Temp: 97.5 F (36.4 C)  Resp: 16   Filed Vitals:   10/01/11 0529 10/01/11 1451 10/01/11 2146 10/02/11 0543  BP: 110/64 98/68 92/61  107/73  Pulse: 75 61 62 53  Temp:  98.4 F (36.9 C) 98.6 F (37 C) 97.4 F (36.3 C) 97.5 F (36.4 C)  TempSrc: Oral Oral Oral Oral  Resp: 18 18 16 16   Height:      Weight: 79.062 kg (174 lb 4.8 oz)     SpO2:  98% 94% 96%   Physical Exam:  General: Well developed, well nourished, in no acute distress.  Head: Normocephalic and atraumatic.  Lungs: Clear to auscultation bilaterally.  Heart: Normal S1 and S2. No murmur, no rubs or gallops.  Abdomen: soft, non-tender, positive bowel sounds.  Extremities: She has lymphedema bilateral lower extremities, no tremor Neurologic: Alert and oriented x 3. Cranial last 2-12 grossly intact, nonfocal.  Discharge Instructions  Discharge Orders    Future Orders Please Complete By Expires   Diet - low sodium heart healthy      Increase activity slowly        Medication List  As of 10/02/2011  9:38 AM   STOP taking these medications         hydrochlorothiazide 12.5 MG capsule         TAKE these medications         ALPRAZolam 0.5 MG tablet   Commonly known as: XANAX   Take 0.5 mg by mouth daily as needed. ANXIETY      aspirin 81 MG EC tablet   Take 1 tablet (81 mg total) by mouth daily. Swallow whole.      citalopram 20 MG tablet   Commonly known as: CELEXA   Take 20 mg by mouth daily.      diltiazem 120 MG 24 hr capsule   Commonly known as: CARDIZEM CD   Take 1 capsule (120 mg total) by mouth daily.      estrogen (conjugated)-medroxyprogesterone 0.625-2.5 MG per tablet   Commonly known as: PREMPRO   Take 1 tablet by mouth daily.      folic acid 1 MG tablet   Commonly known as: FOLVITE   Take 1 tablet (1 mg total) by mouth daily.      multivitamin with minerals Tabs   Take 1 tablet by mouth daily.      thiamine 100 MG tablet   Take 1 tablet (100 mg total) by mouth daily.           Follow-up Information    Follow up with [name], MD on 10/04/2011. (8:00am stress test)    Contact information:   [address] [place]  [address] [phone]       Follow up with [name], NP on 10/10/2011. (8:30am )    Contact information:   [facility] [address], [address] [place] [address] [phone]           The results of significant diagnostics from this hospitalization (including imaging, microbiology, ancillary and laboratory) are listed below for reference.    Significant Diagnostic Studies: Ct Angio Chest W/cm &/or Wo Cm  10/01/2011  *RADIOLOGY REPORT*  Clinical Data: Chest pain for 1 day.  CT ANGIOGRAPHY CHEST  Technique:  Multidetector CT imaging of the chest using the standard protocol during  bolus administration of intravenous contrast. Multiplanar reconstructed images including MIPs were obtained and reviewed to evaluate the vascular anatomy.  Contrast: OMNIPAQUE IOHEXOL 350 MG/ML SOLN  Comparison: 03/08/2011  Findings: Technically adequate study with good opacification of the central and segmental pulmonary arteries.  No focal filling defects.  No evidence of significant pulmonary embolus.  Normal caliber thoracic aorta.  Scattered coronary artery calcifications.  Mild cardiac enlargement.  The esophagus is mostly decompressed.  No significant lymphadenopathy in the chest.  Focal gas collections in the soft tissues of the lower neck anteriorly probably represent venous gas relating to intravenous injection. Calcified granulomas in the spleen.  No pleural effusions.  Interstitial scarring in the lung bases and peripheral zones demonstrating some progression since the previous study.  Calcified granulomas in the lungs.  No focal airspace consolidation.  No pneumothorax.  Airways appear patent.  Degenerative changes in the thoracic spine.  IMPRESSION: No evidence of significant pulmonary embolus.  Calcified granulomas and interstitial fibrosis likely representing postinflammatory change.  No focal consolidation.  No acute process identified.  Original  Report Authenticated By: Marlon Pel, M.D.   Dg Chest Port 1 View  10/01/2011  *RADIOLOGY REPORT*  Clinical Data: New onset atrial fibrillation  PORTABLE CHEST - 1 VIEW  Comparison: CT 03/08/2011 and chest radiograph 03/08/2011  Findings: Calcified granulomata again noted.  Cardiac leads appear detail.  Heart size is upper limits of normal which could be accentuated by AP portable semi erect positioning and technique. No new focal pulmonary opacity.  No pleural effusion.  IMPRESSION: No new focal acute finding.  Original Report Authenticated By: Harrel Lemon, M.D.    Microbiology: No results found for this or any previous visit (from the past 240 hour(s)).   Labs: Basic Metabolic Panel:  Lab 10/02/11 9562 10/01/11 0630 10/01/11 0152  NA 132* -- 138  K 4.1 -- 3.8  CL 99 -- 99  CO2 22 -- --  GLUCOSE 94 -- 104*  BUN 13 -- 16  CREATININE 0.81 0.74 1.00  CALCIUM 8.2* -- --  MG -- -- --  PHOS -- -- --   Liver Function Tests: No results found for this basename: AST:5,ALT:5,ALKPHOS:5,BILITOT:5,PROT:5,ALBUMIN:5 in the last 168 hours No results found for this basename: LIPASE:5,AMYLASE:5 in the last 168 hours No results found for this basename: AMMONIA:5 in the last 168 hours CBC:  Lab 10/02/11 0530 10/01/11 0630 10/01/11 0152 10/01/11 0130  WBC 5.5 9.1 -- 11.9*  NEUTROABS -- -- -- 8.6*  HGB 10.0* 11.3* 14.6 12.8  HCT 30.3* 33.8* 43.0 38.1  MCV 95.3 93.9 -- 94.1  PLT 234 245 -- 334   Cardiac Enzymes:  Lab 10/02/11 0436 10/01/11 2210 10/01/11 1410 10/01/11 0554  CKTOTAL 75 62 54 66  CKMB 3.3 2.4 1.5 1.7  CKMBINDEX -- -- -- --  TROPONINI 0.49* 0.59* <0.30 <0.30   BNP: BNP (last 3 results) No results found for this basename: PROBNP:3 in the last 8760 hours CBG: No results found for this basename: GLUCAP:5 in the last 168 hours  Time coordinating discharge:  Signed:  Shiori Adcox C  [facility] 10/02/2011, 9:38 AM

## 2012-10-09 ENCOUNTER — Telehealth: Payer: Self-pay | Admitting: *Deleted

## 2012-10-09 NOTE — Telephone Encounter (Signed)
Pt is requesting a refill on Macrobid 100 mg (via fax request). Pt's last aex was on 02/20/2012 (no Rx for Macrobid given), and next aex scheduled for 05/28/2013. Last Macrobid Rx given to pt on 01/31/2011. Please advise. Chart on your door.

## 2012-10-10 MED ORDER — NITROFURANTOIN MONOHYD MACRO 100 MG PO CAPS: 100.0000 mg | ORAL_CAPSULE | Freq: Every day | ORAL | Status: DC

## 2012-10-10 NOTE — Telephone Encounter (Signed)
RX done via EPIC.

## 2012-10-16 ENCOUNTER — Other Ambulatory Visit: Payer: Self-pay

## 2012-10-16 MED ORDER — NITROFURANTOIN MONOHYD MACRO 100 MG PO CAPS: ORAL_CAPSULE | ORAL | Status: DC

## 2012-10-16 NOTE — Telephone Encounter (Signed)
rx came in thru fax for macrobid 100mg . Pt had her last aex 02/20/12 & per chart no rx given. Pt was last given rx at her aex 01/31/11 for 1 yr. Please approve or deny rx

## 2013-01-01 ENCOUNTER — Other Ambulatory Visit: Payer: Self-pay | Admitting: Cardiology

## 2013-01-02 ENCOUNTER — Other Ambulatory Visit: Payer: Self-pay | Admitting: Cardiology

## 2013-01-02 MED ORDER — DILTIAZEM HCL ER COATED BEADS 120 MG PO CP24: 120.0000 mg | ORAL_CAPSULE | Freq: Every day | ORAL | Status: DC

## 2013-01-07 ENCOUNTER — Other Ambulatory Visit: Payer: Self-pay

## 2013-01-07 MED ORDER — DILTIAZEM HCL ER COATED BEADS 120 MG PO CP24: 120.0000 mg | ORAL_CAPSULE | Freq: Every day | ORAL | Status: DC

## 2013-02-20 ENCOUNTER — Telehealth: Payer: Self-pay

## 2013-02-26 MED ORDER — FUROSEMIDE 20 MG PO TABS: 20.0000 mg | ORAL_TABLET | Freq: Every day | ORAL | Status: DC

## 2013-02-26 NOTE — Telephone Encounter (Signed)
Refilled

## 2013-02-27 ENCOUNTER — Other Ambulatory Visit: Payer: Self-pay | Admitting: Cardiology

## 2013-02-27 MED ORDER — FUROSEMIDE 20 MG PO TABS: 20.0000 mg | ORAL_TABLET | Freq: Every day | ORAL | Status: DC

## 2013-02-27 NOTE — Telephone Encounter (Signed)
Rx sent to pharmacy   

## 2013-03-02 ENCOUNTER — Ambulatory Visit (INDEPENDENT_AMBULATORY_CARE_PROVIDER_SITE_OTHER): Payer: Commercial Indemnity | Admitting: Obstetrics & Gynecology

## 2013-03-02 ENCOUNTER — Encounter: Payer: Self-pay | Admitting: Obstetrics & Gynecology

## 2013-03-02 VITALS — BP 132/82 | HR 60 | Resp 16 | Ht 63.0 in

## 2013-03-02 DIAGNOSIS — Z01419 Encounter for gynecological examination (general) (routine) without abnormal findings: Secondary | ICD-10-CM

## 2013-03-02 DIAGNOSIS — Z0000 Encounter for general adult medical examination without abnormal findings: Secondary | ICD-10-CM

## 2013-03-02 DIAGNOSIS — Z124 Encounter for screening for malignant neoplasm of cervix: Secondary | ICD-10-CM

## 2013-03-02 LAB — POCT URINALYSIS DIPSTICK
Bilirubin, UA: NEGATIVE
Glucose, UA: NEGATIVE
Ketones, UA: NEGATIVE
Leukocytes, UA: NEGATIVE

## 2013-03-02 LAB — HEMOGLOBIN, FINGERSTICK: Hemoglobin, fingerstick: 12.7 g/dL (ref 12.0–16.0)

## 2013-03-02 LAB — COMPREHENSIVE METABOLIC PANEL
Albumin: 4.2 g/dL (ref 3.5–5.2)
Alkaline Phosphatase: 62 U/L (ref 39–117)
BUN: 17 mg/dL (ref 6–23)
CO2: 28 mEq/L (ref 19–32)
Calcium: 9.3 mg/dL (ref 8.4–10.5)
Chloride: 98 mEq/L (ref 96–112)
Glucose, Bld: 87 mg/dL (ref 70–99)
Potassium: 4.3 mEq/L (ref 3.5–5.3)
Sodium: 136 mEq/L (ref 135–145)
Total Protein: 6.8 g/dL (ref 6.0–8.3)

## 2013-03-02 LAB — LIPID PANEL
Cholesterol: 204 mg/dL — ABNORMAL HIGH (ref 0–200)
HDL: 66 mg/dL (ref >39)
LDL Cholesterol: 102 mg/dL — ABNORMAL HIGH (ref 0–99)
Triglycerides: 180 mg/dL — ABNORMAL HIGH (ref <150)

## 2013-03-02 MED ORDER — ALPRAZOLAM 0.5 MG PO TABS: 0.5000 mg | ORAL_TABLET | Freq: Every day | ORAL | Status: DC | PRN

## 2013-03-02 MED ORDER — ESTROGENS CONJUGATED 0.625 MG PO TABS: 0.6250 mg | ORAL_TABLET | Freq: Every day | ORAL | Status: DC

## 2013-03-02 NOTE — Patient Instructions (Signed)

## 2013-03-02 NOTE — Progress Notes (Signed)
Savannah y.o. G1P1 MarriedCaucasianF here for annual exam.  No VB.  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  May run for a 3rd term.  Friends/family coming for christmass  Patient's last menstrual period was 03/19/1984.          Sexually active: yes  The current method of family planning is status post hysterectomy.    Exercising: yes  some Smoker:  no  Health Maintenance: Pap:  02/20/12 WNL-h/o SCC cervix History of abnormal Pap:  yes MMG:  09/05/12 3D-normal Colonoscopy:  8/05 BMD:   4/10 TDaP:  11/12 Screening Labs: today, Hb today: 12.7, Urine today: negative   reports that she has never smoked. She has never used smokeless tobacco. She reports that she drinks alcohol. She reports that she does not use illicit drugs.  Past Medical History  Diagnosis Date  . Hypertension   . Cancer     cervical/rad hysterectomy/bso wiht chemo for small cell ca  . Atrial fibrillation   . Anemia     borderline    Past Surgical History  Procedure Laterality Date  . Abdominal hysterectomy      BSO    Current Outpatient Prescriptions  Medication Sig Dispense Refill  . ALPRAZolam (XANAX) 0.5 MG tablet Take 0.5 mg by mouth daily as needed. ANXIETY      . citalopram (CELEXA) 20 MG tablet Take 20 mg by mouth daily.      [name] Kitchen diltiazem (CARDIZEM CD) 120 MG 24 hr capsule Take 1 capsule (120 mg total) by mouth daily.  15 capsule  0  . furosemide (LASIX) 20 MG tablet Take 1 tablet (20 mg total) by mouth daily.  30 tablet  6  . Multiple Vitamin (MULTIVITAMIN WITH MINERALS) TABS Take 1 tablet by mouth daily.      . nitrofurantoin, macrocrystal-monohydrate, (MACROBID) 100 MG capsule Take 1 po qd post coital  90 capsule  1  . PREMARIN 0.625 MG tablet 0.625 mg daily.      . fluticasone (FLONASE) 50 MCG/ACT nasal spray        No current facility-administered medications for this visit.    Family History  Problem Relation Age of Onset  . Diabetes Mother   . Hypertension Mother   . Skin  cancer Father   . Lung cancer Mother   . Cancer Paternal Grandfather     unknown type  . CVA Father     ROS:  Pertinent items are noted in HPI.  Otherwise, a comprehensive ROS was negative.  Exam:   BP 132/82  Pulse 60  Resp 16  Ht 5\' 3"  (1.6 m)  LMP 03/19/1984  Declines weight today.   Height: 5\' 3"  (160 cm)  Ht Readings from Last 3 Encounters:  03/02/13 5\' 3"  (1.6 m)  10/01/11 5\' 3"  (1.6 m)    General appearance: alert, cooperative and appears stated age Head: Normocephalic, without obvious abnormality, atraumatic Neck: no adenopathy, supple, symmetrical, trachea midline and thyroid normal to inspection and palpation Lungs: clear to auscultation bilaterally Breasts: normal appearance, no masses or tenderness Heart: regular rate and rhythm Abdomen: soft, non-tender; bowel sounds normal; no masses,  no organomegaly Extremities: extremities normal, atraumatic, no cyanosis or edema Skin: Skin color, texture, turgor normal. No rashes or lesions Lymph nodes: Cervical, supraclavicular, and axillary nodes normal. No abnormal inguinal nodes palpated Neurologic: Grossly normal   Pelvic: External genitalia:  no lesions              Urethra:  normal appearing urethra with no masses, tenderness or lesions              Bartholins and Skenes: normal                 Vagina: normal appearing vagina with normal color and discharge, no lesions              Cervix: absent              Pap taken: no Bimanual Exam:  Uterus:  uterus absent              Adnexa: no mass, fullness, tenderness               Rectovaginal: Confirms               Anus:  normal sphincter tone, no lesions  A:  Well Woman with normal exam PMP, on HRT H/O radical hysterectomy/BSO 1986 Mild anxiety  P:   Mammogram yearly Rx for Premarin 0.625mg  daily.  #90/4. Xanax 0.5mg  prn  #30 CMP, Lipids, TSH, Vit D pap smear obtained. return annually or prn  An After Visit Summary was printed and given to the  patient.

## 2013-03-05 LAB — IPS PAP TEST WITH HPV

## 2013-05-28 ENCOUNTER — Other Ambulatory Visit: Payer: Self-pay | Admitting: *Deleted

## 2013-05-28 ENCOUNTER — Ambulatory Visit: Payer: Self-pay | Admitting: Obstetrics & Gynecology

## 2013-05-28 MED ORDER — ESTROGENS CONJUGATED 0.625 MG PO TABS: 0.6250 mg | ORAL_TABLET | Freq: Every day | ORAL | Status: DC

## 2013-05-28 NOTE — Telephone Encounter (Signed)
Incoming fax from [facility] requesting refills for Citalopram and Premarin.  Last AEX 05/03/2012 No Future appt scheduled Last refill: Premarin 03/02/2013 #90/4 refills. Citalopram no refills found in Epic nor Paper Chart.   Refill Premarin #90/2 R sent to [facility].   Unable to refill Citalopram.  Xanax was refilled at last AEX.  -Called pt, Unable to leave voicemail. - She may have been getting Citalopram from PCP.

## 2013-05-29 ENCOUNTER — Telehealth: Payer: Self-pay | Admitting: Obstetrics & Gynecology

## 2013-05-29 ENCOUNTER — Other Ambulatory Visit: Payer: Self-pay | Admitting: Obstetrics and Gynecology

## 2013-05-29 MED ORDER — ESTRADIOL 0.5 MG PO TABS: 0.5000 mg | ORAL_TABLET | Freq: Every day | ORAL | Status: DC

## 2013-05-29 NOTE — Telephone Encounter (Signed)
Patient is in between insurance and went to pick up medication Premarin but it cost 200 for a month. Pharmacy told her to call and ask if she could switch to estraidol because it was only 6 dollars. Can she do this for a month until she gets insurance fixed?

## 2013-05-29 NOTE — Telephone Encounter (Signed)
Called patient and message from Dr. Quincy Simmonds given. She will switch to Estradiol for one month and call with any concerns.

## 2013-05-29 NOTE — Telephone Encounter (Signed)
Ok to switch to Estrace 0.5 mg daily.  #30.  RF zero.  Do not take both Premarin and Estrace, of course.   I will put in order.

## 2013-05-29 NOTE — Telephone Encounter (Signed)
Routing to Dr. Quincy Simmonds for review. Do you advise for one month of Estradiol instead of Premarin?

## 2013-06-05 ENCOUNTER — Ambulatory Visit: Payer: Commercial Indemnity | Admitting: Cardiology

## 2013-07-01 ENCOUNTER — Ambulatory Visit (INDEPENDENT_AMBULATORY_CARE_PROVIDER_SITE_OTHER): Payer: BC Managed Care – PPO | Admitting: Cardiology

## 2013-07-01 ENCOUNTER — Encounter: Payer: Self-pay | Admitting: Cardiology

## 2013-07-01 VITALS — BP 143/89 | HR 61 | Ht 63.0 in

## 2013-07-01 DIAGNOSIS — I890 Lymphedema, not elsewhere classified: Secondary | ICD-10-CM

## 2013-07-01 DIAGNOSIS — I4891 Unspecified atrial fibrillation: Secondary | ICD-10-CM

## 2013-07-01 DIAGNOSIS — I480 Paroxysmal atrial fibrillation: Secondary | ICD-10-CM | POA: Insufficient documentation

## 2013-07-01 DIAGNOSIS — I444 Left anterior fascicular block: Secondary | ICD-10-CM

## 2013-07-01 DIAGNOSIS — I4460 Unspecified fascicular block: Secondary | ICD-10-CM

## 2013-07-01 NOTE — Progress Notes (Signed)
[place]. [address], [address] [place],   [address] Phone: [phone] Fax:  [phone]  Date:  07/01/2013   ID:  Savannah Hill, DOB 1950-11-10, MRN [id number]  PCP:  No PCP Per Patient   History of Present Illness: Savannah Hill is a 63 y.o. female with paroxysmal Atrial fibrillation and was given cardizem drip and spontaneously converted to NSR during July 2013 hospitalization. Nuclear stress test negative for ischemia, Chest CT with interstial fibrosis, no pulmonary emboli. Echocardiogram with EF 55-60% and no wall motion abnormality. She was discharged on Diltiazem 120 mg po qd. She was noted to have heavy alcohol consumption. Overall she is doing very well. Very brief episodes of palpitations but no significant lengthy episodes. She is also [employer]. She was under a significant amount of stress during her episodes of atrial fibrillation. Recently she had an episode of bronchitis. Currently she is improving. Received antibiotics. As above, rare episodes of palpitations.    Wt Readings from Last 3 Encounters:  10/01/11 174 lb 4.8 oz (79.062 kg)     Past Medical History  Diagnosis Date  . Hypertension   . Cancer     cervical/rad hysterectomy/bso wiht chemo for small cell ca  . Atrial fibrillation   . Anemia     borderline    Past Surgical History  Procedure Laterality Date  . Radical abdominal hysterectomy  1986    with BSO    Current Outpatient Prescriptions  Medication Sig Dispense Refill  . ALPRAZolam (XANAX) 0.5 MG tablet Take 1 tablet (0.5 mg total) by mouth daily as needed. ANXIETY  30 tablet  0  . citalopram (CELEXA) 20 MG tablet Take 20 mg by mouth daily.      [name] Kitchen diltiazem (CARDIZEM CD) 120 MG 24 hr capsule Take 1 capsule (120 mg total) by mouth daily.  15 capsule  0  . estradiol (ESTRACE) 0.5 MG tablet Take 1 tablet (0.5 mg total) by mouth daily. Do not take both oral Estrace and oral Premarin.  30 tablet  0  . estrogens,  conjugated, (PREMARIN) 0.625 MG tablet Take 1 tablet (0.625 mg total) by mouth daily.  90 tablet  2  . fluticasone (FLONASE) 50 MCG/ACT nasal spray       . furosemide (LASIX) 20 MG tablet Take 1 tablet (20 mg total) by mouth daily.  30 tablet  6  . Multiple Vitamin (MULTIVITAMIN WITH MINERALS) TABS Take 1 tablet by mouth daily.      . nitrofurantoin, macrocrystal-monohydrate, (MACROBID) 100 MG capsule Take 1 po qd post coital  90 capsule  1   No current facility-administered medications for this visit.    Allergies:   No Known Allergies  Social History:  The patient  reports that she has never smoked. She has never used smokeless tobacco. She reports that she drinks alcohol. She reports that she does not use illicit drugs.   ROS:  Please see the history of present illness.   No syncope, no bleeding, no orthopnea, no PND  PHYSICAL EXAM: VS:  BP 143/89  Pulse 61  Ht 5\' 3"  (1.6 m)  LMP 03/19/1984 Well nourished, well developed, in no acute distress HEENT: normal Neck: no JVD Cardiac:  normal S1, S2; RRR; no murmur Lungs:  clear to auscultation bilaterally, no wheezing, rhonchi or rales Abd: soft, nontender, no hepatomegaly Ext: RLE lymphedema. Skin: warm and dry Neuro: no focal abnormalities noted  EKG:  07/01/13-Sinus rhythm, borderline first  degree AV block, heart rate 62, poor R wave progression, left anterior fascicular block     ASSESSMENT AND PLAN:  1. Paroxysmal atrial fibrillation-no recent episodes. We discussed risk factors of alcohol, weight. Continue with diltiazem. CHADS2  - 1. No changes made. 2. Lymphedema lower extremity-chronic. She saw a physician in [place] that does lymph node transplantation and she did not think she would be a good candidate for this procedure. 3. LAFB - no syncope. Monitor.   Signed, Candee Furbish, MD [facility]  07/01/2013 12:21 PM

## 2013-07-01 NOTE — Patient Instructions (Signed)
Your physician recommends that you continue on your current medications as directed. Please refer to the Current Medication list given to you today.  Your physician wants you to follow-up in: 1 year. You will receive a reminder letter in the mail two months in advance. If you don't receive a letter, please call our office to schedule the follow-up appointment.  

## 2013-07-08 ENCOUNTER — Other Ambulatory Visit: Payer: Self-pay | Admitting: *Deleted

## 2013-07-08 NOTE — Telephone Encounter (Signed)
Incoming fax from [facility] requesting refill for Estradiol 0.5 mg Tab  Last AEX 03/02/2013 Last refill 05/29/2013 #30/0 refills: changed from Premarin by Dr. Quincy Simmonds. - Patient stated she was having insurance issues with Premarin. Last MMG 08/2012 BI-RADS1: neg Next appt 03/2014  Please approve or deny Rx.

## 2013-07-09 MED ORDER — ESTRADIOL 0.5 MG PO TABS: 0.5000 mg | ORAL_TABLET | Freq: Every day | ORAL | Status: DC

## 2013-08-11 ENCOUNTER — Other Ambulatory Visit: Payer: Self-pay | Admitting: *Deleted

## 2013-08-11 IMAGING — CT CT ANGIO CHEST
1 of 2 series · 19 of 32 positions shown · IV contrast (omnipaque)
Comparison: 03/08/2011

CLINICAL DATA: Chest pain for 1 day.

CT ANGIOGRAPHY CHEST
TECHNIQUE: Multidetector CT imaging of the chest using the
standard protocol during bolus administration of intravenous
contrast. Multiplanar reconstructed images including MIPs were
obtained and reviewed to evaluate the vascular anatomy.
Contrast: 100mL OMNIPAQUE IOHEXOL 350 MG/ML SOLN

[Series 9: thins for pacs · axial · 0.59mm/px · z∈[+1500,+1710]mm · 19 of 233 slices shown]
[im 12/233  lung]
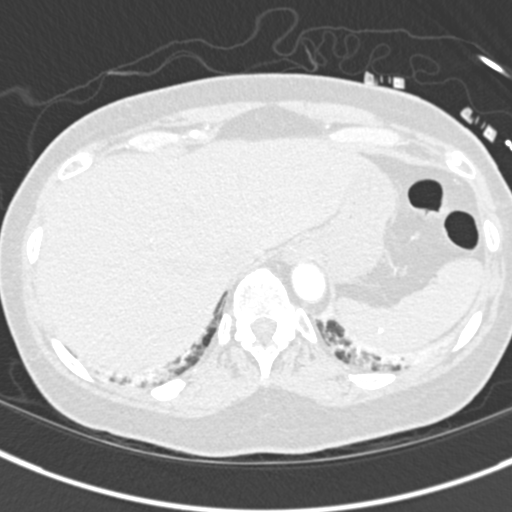
[im 24/233  mediastinal]
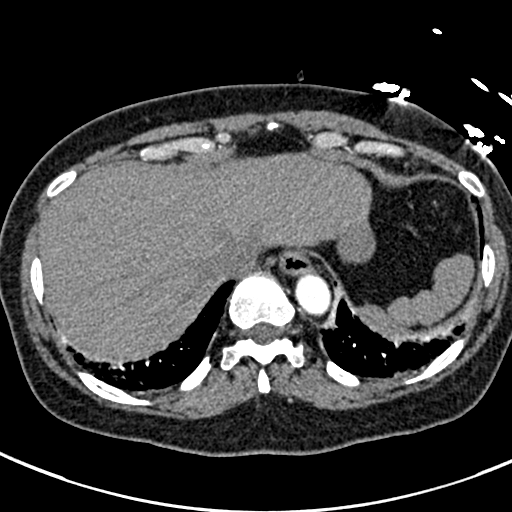
[im 35/233  lung]
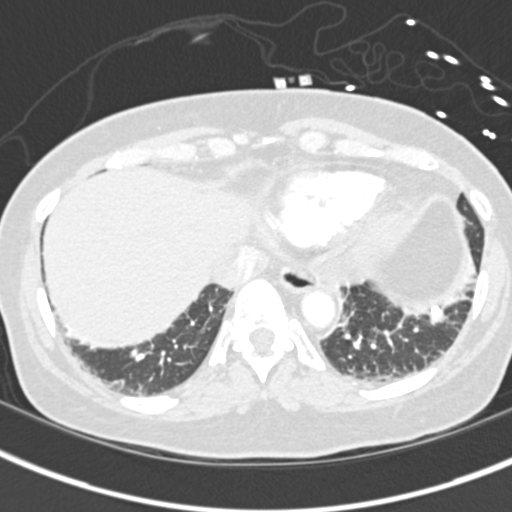
[im 59/233  mediastinal]
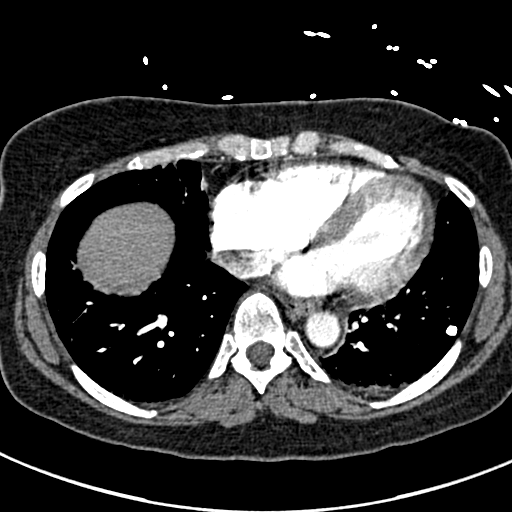
[im 70/233  lung]
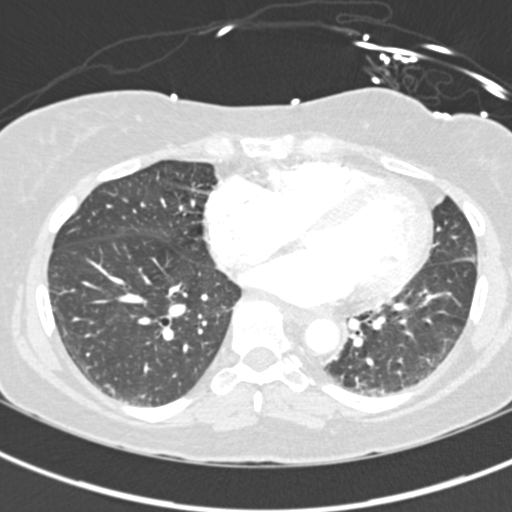
[im 78/233  mediastinal]
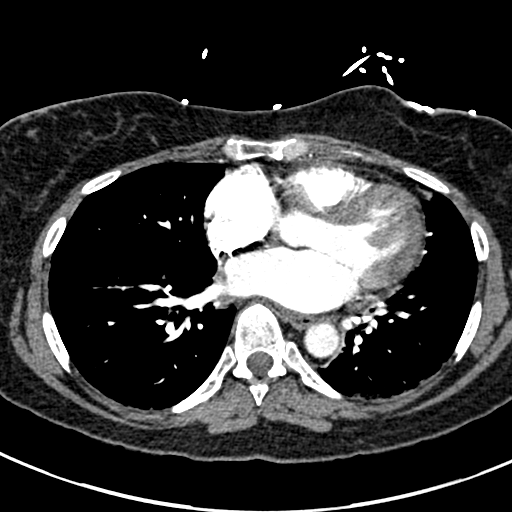
[im 82/233  lung]
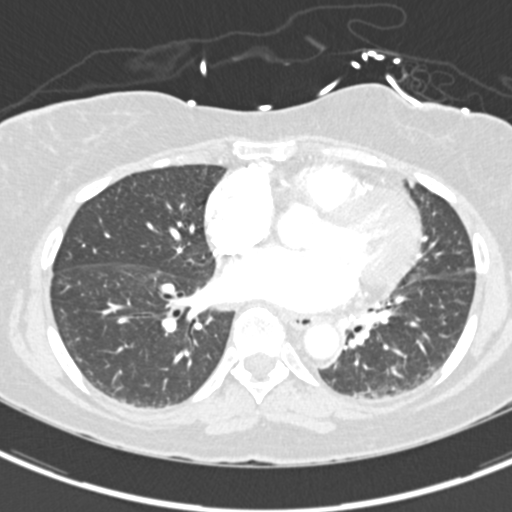
[im 93/233  mediastinal]
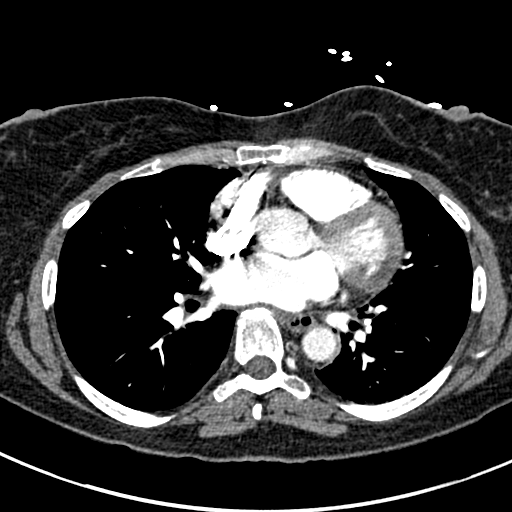
[im 105/233  lung]
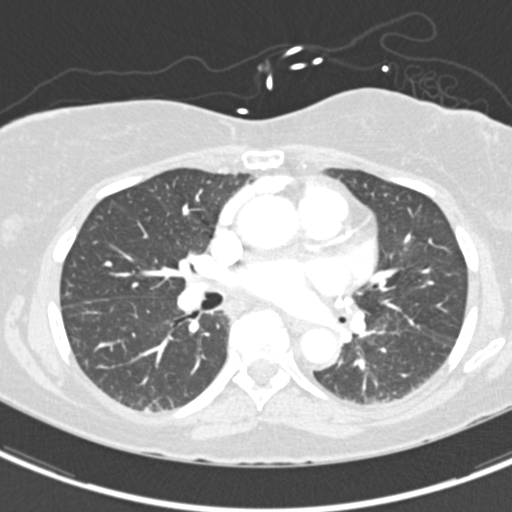
[im 117/233  mediastinal]
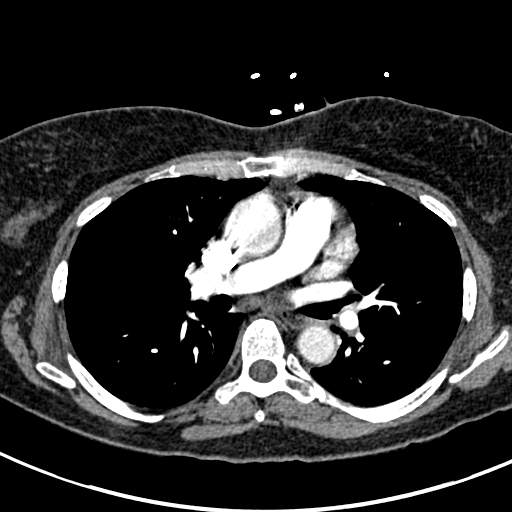
[im 128/233  lung]
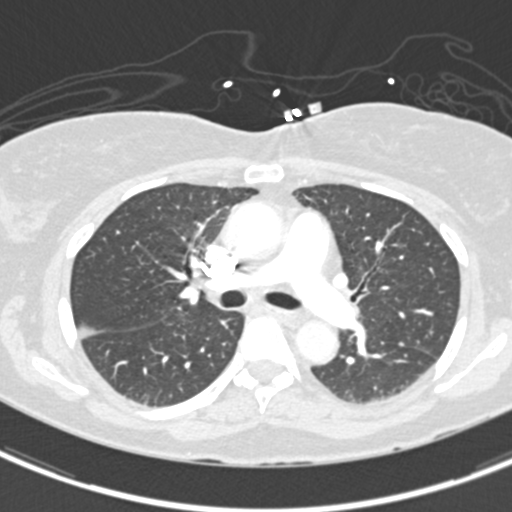
[im 140/233  mediastinal]
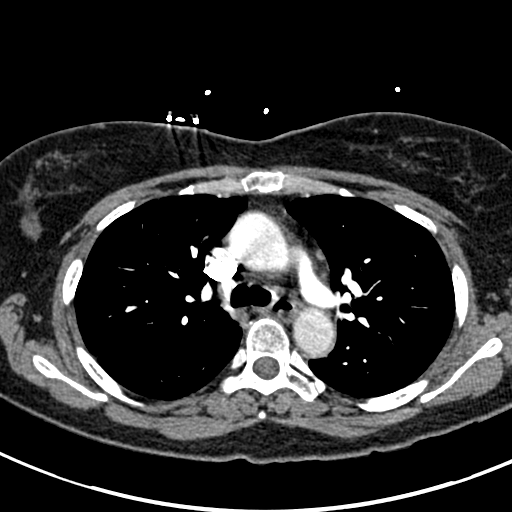
[im 151/233  lung]
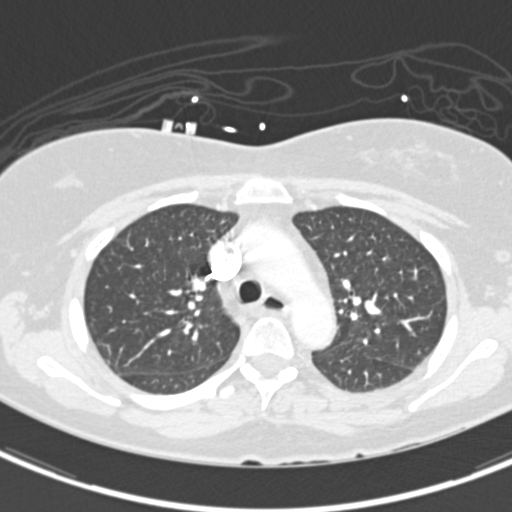
[im 155/233  mediastinal]
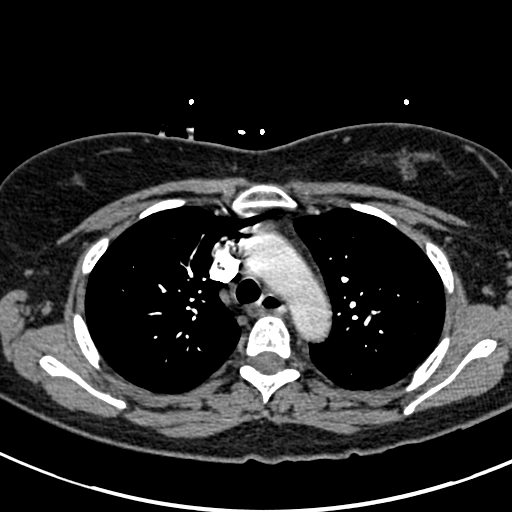
[im 163/233  lung]
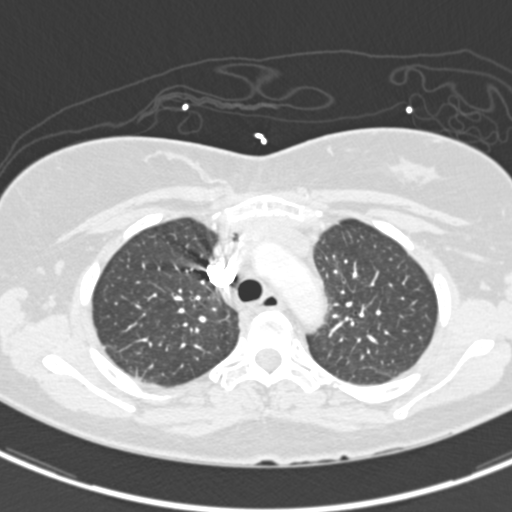
[im 175/233  mediastinal]
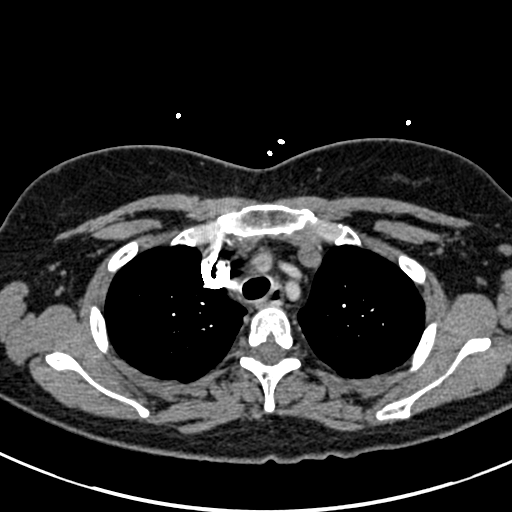
[im 198/233  lung]
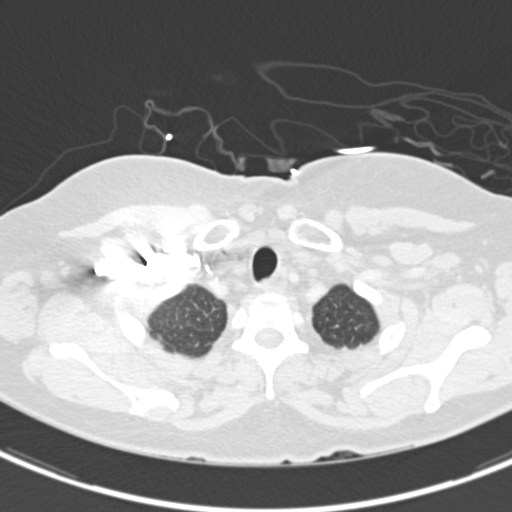
[im 209/233  mediastinal]
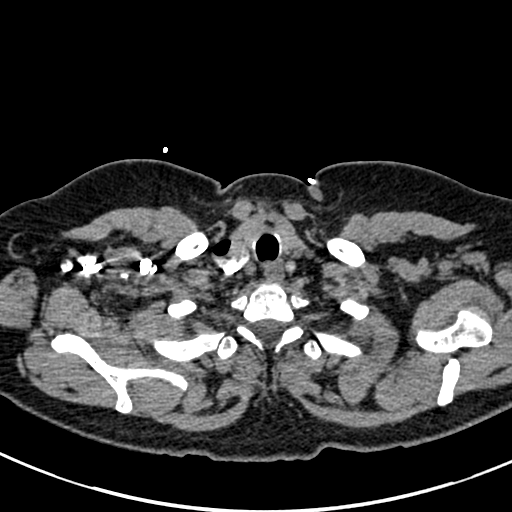
[im 221/233  lung]
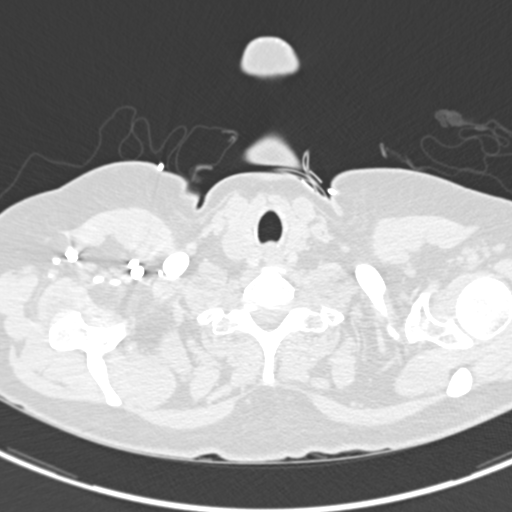

[19 of 32 positions shown; findings below may reference images not displayed]

FINDINGS: Technically adequate study with good opacification of the
central and segmental pulmonary arteries.  No focal filling
defects.  No evidence of significant pulmonary embolus.

Normal caliber thoracic aorta.  Scattered coronary artery
calcifications.  Mild cardiac enlargement.  The esophagus is mostly
decompressed.  No significant lymphadenopathy in the chest.  Focal
gas collections in the soft tissues of the lower neck anteriorly
probably represent venous gas relating to intravenous injection.
Calcified granulomas in the spleen.  No pleural effusions.

Interstitial scarring in the lung bases and peripheral zones
demonstrating some progression since the previous study.  Calcified
granulomas in the lungs.  No focal airspace consolidation.  No
pneumothorax.  Airways appear patent.  Degenerative changes in the
thoracic spine.
IMPRESSION: No evidence of significant pulmonary embolus.  Calcified granulomas
and interstitial fibrosis likely representing postinflammatory
change.  No focal consolidation.  No acute process identified.

## 2013-08-11 MED ORDER — ESTRADIOL 0.5 MG PO TABS: 0.5000 mg | ORAL_TABLET | Freq: Every day | ORAL | Status: DC

## 2013-08-11 NOTE — Telephone Encounter (Signed)
Incoming fax from [facility] requesting refill for Estradiol 0.5 mg Tab   Last AEX 03/02/2013  Last refill 07/09/13, #30 X 0 changed from Premarin by Dr. Quincy Simmonds. - Patient stated she was having insurance issues with Premarin.  Last MMG 08/2012 BI-RADS1: neg  Next appt 03/2014  RX filled until MMG

## 2013-10-26 ENCOUNTER — Other Ambulatory Visit: Payer: Self-pay

## 2013-10-26 NOTE — Telephone Encounter (Signed)
Fax from [facility] Last AEX: 03/02/13 Last refill:08/11/13 #30 X 1 Last MMG: 09/24/13 BI-RADS Category 2 - Benign Current AEX:04/02/14  Please advise

## 2013-10-28 MED ORDER — ESTRADIOL 0.5 MG PO TABS: 0.5000 mg | ORAL_TABLET | Freq: Every day | ORAL | Status: DC

## 2013-11-04 ENCOUNTER — Other Ambulatory Visit: Payer: Self-pay | Admitting: Obstetrics & Gynecology

## 2013-11-04 MED ORDER — NITROFURANTOIN MONOHYD MACRO 100 MG PO CAPS: ORAL_CAPSULE | ORAL | Status: DC

## 2013-11-04 NOTE — Telephone Encounter (Signed)
Last refill 10/16/12 #90/1 R  Last AEX12/15/14 Next appt 04/02/14  Please advise.

## 2013-11-04 NOTE — Telephone Encounter (Signed)
Patient request a refill of: nitrofurantoin, macrocrystal-monohydrate, (MACROBID) 100 MG capsule  [facility]

## 2013-12-01 ENCOUNTER — Telehealth: Payer: Self-pay

## 2013-12-01 NOTE — Telephone Encounter (Signed)
Last AEX: 03/02/13 Last refill:10/28/13 #30 X 5 Current AEX:04/02/14  Need to call pt to find out what pharmacy they would like.

## 2013-12-02 MED ORDER — ESTRADIOL 0.5 MG PO TABS: 0.5000 mg | ORAL_TABLET | Freq: Every day | ORAL | Status: DC

## 2013-12-02 NOTE — Telephone Encounter (Signed)
Pt states she changed ins and [facility]  Sent refills Encounter closed

## 2013-12-02 NOTE — Telephone Encounter (Signed)
LMTCB to find out what pharmacy she wants to use

## 2014-01-18 ENCOUNTER — Encounter: Payer: Self-pay | Admitting: Cardiology

## 2014-02-22 ENCOUNTER — Ambulatory Visit (INDEPENDENT_AMBULATORY_CARE_PROVIDER_SITE_OTHER): Payer: BC Managed Care – PPO | Admitting: Physician Assistant

## 2014-02-22 ENCOUNTER — Other Ambulatory Visit: Payer: Self-pay | Admitting: Physician Assistant

## 2014-02-22 ENCOUNTER — Encounter: Payer: Self-pay | Admitting: Physician Assistant

## 2014-02-22 VITALS — BP 100/60 | HR 86 | Ht 63.0 in | Wt 210.0 lb

## 2014-02-22 DIAGNOSIS — I480 Paroxysmal atrial fibrillation: Secondary | ICD-10-CM

## 2014-02-22 LAB — CBC
HEMATOCRIT: 39 % (ref 36.0–46.0)
HEMOGLOBIN: 12.7 g/dL (ref 12.0–15.0)
MCHC: 32.6 g/dL (ref 30.0–36.0)
MCV: 95.2 fl (ref 78.0–100.0)
PLATELETS: 396 10*3/uL (ref 150.0–400.0)
RBC: 4.1 Mil/uL (ref 3.87–5.11)
RDW: 13.8 % (ref 11.5–15.5)
WBC: 8.4 10*3/uL (ref 4.0–10.5)

## 2014-02-22 LAB — BASIC METABOLIC PANEL
BUN: 17 mg/dL (ref 6–23)
CALCIUM: 9.2 mg/dL (ref 8.4–10.5)
CO2: 27 mEq/L (ref 19–32)
Chloride: 104 mEq/L (ref 96–112)
Creatinine, Ser: 1.1 mg/dL (ref 0.4–1.2)
GFR: 51.14 mL/min — AB (ref >60.00)
Glucose, Bld: 87 mg/dL (ref 70–99)
Potassium: 4 mEq/L (ref 3.5–5.1)
Sodium: 139 mEq/L (ref 135–145)

## 2014-02-22 LAB — TSH: TSH: 2.37 u[IU]/mL (ref 0.35–4.50)

## 2014-02-22 LAB — HEPATIC FUNCTION PANEL
ALK PHOS: 68 U/L (ref 39–117)
ALT: 8 U/L (ref 0–35)
AST: 21 U/L (ref 0–37)
Albumin: 3.9 g/dL (ref 3.5–5.2)
Bilirubin, Direct: 0 mg/dL (ref 0.0–0.3)
TOTAL PROTEIN: 7.5 g/dL (ref 6.0–8.3)
Total Bilirubin: 0.5 mg/dL (ref 0.2–1.2)

## 2014-02-22 MED ORDER — APIXABAN 5 MG PO TABS: 5.0000 mg | ORAL_TABLET | Freq: Two times a day (BID) | ORAL | Status: DC

## 2014-02-22 MED ORDER — DILTIAZEM HCL ER COATED BEADS 120 MG PO CP24: 120.0000 mg | ORAL_CAPSULE | Freq: Two times a day (BID) | ORAL | Status: DC

## 2014-02-22 NOTE — Progress Notes (Signed)
HPI: This is a 63 year old female patient of [name] who has history of paroxysmal atrial fibrillation that converted to normal sinus rhythm in July 2013. Her nuclear stress test was negative for ischemia. Chest CT with interstitial fibrosis no PE. Echo EF 55-60% with no wall motion abnormality. She was treated with diltiazem 120 mg daily. She was also noted to have heavy alcohol consumption. She was last seen here in April 2015 at which time she was doing well.  The patient comes in today with 1 month history of recurrent palpitations. She is under a lot of stress because she moved her parents here from [place] 2 weeks ago. She is trying to get him settled. 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 She continues to drink more alcohol than she should according to her. She'll drink 5 or 6 glasses of wine or alcohol on a Friday and Saturday. Sometimes during the week as well. She says if she tries to do any physical activity her heart does begin to race. She complains of dyspnea on exertion with very little activity Her blood pressure is also been running on the low side.  No Known Allergies   Current Outpatient Prescriptions  Medication Sig Dispense Refill  . aspirin 81 MG tablet Take 81 mg by mouth daily.    . citalopram (CELEXA) 20 MG tablet Take 20 mg by mouth daily.    [name] Kitchen estradiol (ESTRACE) 0.5 MG tablet Take 1 tablet (0.5 mg total) by mouth daily. 90 tablet 1  . furosemide (LASIX) 20 MG tablet Take 1 tablet (20 mg total) by mouth daily. 30 tablet 6  . Multiple Vitamin (MULTIVITAMIN WITH MINERALS) TABS Take 1 tablet by mouth daily.    [name] Kitchen ALPRAZolam (XANAX) 0.5 MG tablet Take 1 tablet (0.5 mg total) by mouth daily as needed. ANXIETY (Patient not taking: Reported on 02/22/2014) 30 tablet 0  . diltiazem (CARDIZEM CD) 120 MG 24 hr capsule Take 1 capsule (120 mg total) by mouth daily. 15 capsule 0  . fluticasone (FLONASE) 50 MCG/ACT nasal spray     .  nitrofurantoin, macrocrystal-monohydrate, (MACROBID) 100 MG capsule Take 1 po qd post coital (Patient not taking: Reported on 02/22/2014) 90 capsule 1   No current facility-administered medications for this visit.    Past Medical History  Diagnosis Date  . Hypertension   . Cancer     cervical/rad hysterectomy/bso wiht chemo for small cell ca  . Atrial fibrillation   . Anemia     borderline    Past Surgical History  Procedure Laterality Date  . Radical abdominal hysterectomy  1986    with BSO    Family History  Problem Relation Age of Onset  . Diabetes Mother   . Hypertension Mother   . Skin cancer Father   . Lung cancer Mother   . Cancer Paternal Grandfather     unknown type  . CVA Father     History   Social History  . Marital Status: Married    Spouse Name: N/A    Number of Children: N/A  . Years of Education: N/A   Occupational History  . Not on file.   Social History Main Topics  . Smoking status: Never Smoker   . Smokeless tobacco: Never Used  . Alcohol Use: Yes  . Drug Use: No  . Sexual Activity:    Partners: Male   Other Topics Concern  . Not on file  Social History Narrative    ROS: See history of present illness otherwise negative  BP 100/60 mmHg  Pulse 86  Ht 5\' 3"  (1.6 m)  Wt 210 lb (95.255 kg)  BMI 37.21 kg/m2  LMP 03/19/1984  PHYSICAL EXAM: Well-nournished, in no acute distress. Neck: No JVD, HJR, Bruit, or thyroid enlargement  Lungs: No tachypnea, clear without wheezing, rales, or rhonchi  Cardiovascular: Irregular irregular, PMI not displaced, Normal S1 and S2, no murmurs, gallops, bruit, thrill, or heave.  Abdomen: BS normal. Soft without organomegaly, masses, lesions or tenderness.  Extremities: Chronic lymphedema in the right lower extremity otherwise without cyanosis, clubbing. Good distal pulses bilateral  SKin: Warm, no lesions or rashes   Musculoskeletal: No deformities  Neuro: no focal signs   Wt Readings from  Last 3 Encounters:  02/22/14 210 lb (95.255 kg)  10/01/11 174 lb 4.8 oz (79.062 kg)    Lab Results  Component Value Date   WBC 5.5 10/02/2011   HGB 10.0* 10/02/2011   HCT 30.3* 10/02/2011   PLT 234 10/02/2011   GLUCOSE 87 03/02/2013   CHOL 204* 03/02/2013   TRIG 180* 03/02/2013   HDL 66 03/02/2013   LDLCALC 102* 03/02/2013   ALT 8 03/02/2013   AST 20 03/02/2013   NA 136 03/02/2013   K 4.3 03/02/2013   CL 98 03/02/2013   CREATININE 0.91 03/02/2013   BUN 17 03/02/2013   CO2 28 03/02/2013   TSH 2.430 03/02/2013   INR 0.91 10/01/2011    EKG: Atrial fib at 86 bpm nonspecific ST-T wave changes, no acute change  2-D echo 10/01/11 Study Conclusions  - Left ventricle: The cavity size was normal. Systolic   function was normal. The estimated ejection fraction was   in the range of 55% to 60%. Wall motion was normal; there   were no regional wall motion abnormalities. - Mitral valve: Mild regurgitation.

## 2014-02-22 NOTE — Assessment & Plan Note (Signed)
Patient is back in atrial fibrillation and has probably been in for over a month. I discussed this patient with [name] who agrees to start her on Eliquis 5 mg twice a day after we check her renal function today. Increase diltiazem to 120 mg twice a day. She may not tolerate this dose with her low blood pressure. We will schedule her for cardioversion in 4 weeks. Check 2-D echo in the meantime. We'll check labs including TSH today. Decrease alcohol intake.

## 2014-02-22 NOTE — H&P (Signed)
HPI: This is a 63 year old female patient of Dr. Luther Hill who has history of paroxysmal atrial fibrillation that converted to normal sinus rhythm in July 2013. Her nuclear stress test was negative for ischemia. Chest CT with interstitial fibrosis no PE. Echo EF 55-60% with no wall motion abnormality. She was treated with diltiazem 120 mg daily. She was also noted to have heavy alcohol consumption. She was last seen here in April 2015 at which time she was doing well.  The patient comes in today with 1 month history of recurrent palpitations. She is under a lot of stress because she moved her parents here from [place] 2 weeks ago. She is trying to get him settled. 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 She continues to drink more alcohol than she should according to her. She'll drink 5 or 6 glasses of wine or alcohol on a Friday and Saturday. Sometimes during the week as well. She says if she tries to do any physical activity her heart does begin to race. She complains of dyspnea on exertion with very little activity Her blood pressure is also been running on the low side.  No Known Allergies   Current Outpatient Prescriptions  Medication Sig Dispense Refill  . aspirin 81 MG tablet Take 81 mg by mouth daily.    . citalopram (CELEXA) 20 MG tablet Take 20 mg by mouth daily.    [name] Kitchen estradiol (ESTRACE) 0.5 MG tablet Take 1 tablet (0.5 mg total) by mouth daily. 90 tablet 1  . furosemide (LASIX) 20 MG tablet Take 1 tablet (20 mg total) by mouth daily. 30 tablet 6  . Multiple Vitamin (MULTIVITAMIN WITH MINERALS) TABS Take 1 tablet by mouth daily.    [name] Kitchen ALPRAZolam (XANAX) 0.5 MG tablet Take 1 tablet (0.5 mg total) by mouth daily as needed. ANXIETY (Patient not taking: Reported on 02/22/2014) 30 tablet 0  . diltiazem (CARDIZEM CD) 120 MG 24 hr capsule Take 1 capsule (120 mg  total) by mouth daily. 15 capsule 0  . fluticasone (FLONASE) 50 MCG/ACT nasal spray     . nitrofurantoin, macrocrystal-monohydrate, (MACROBID) 100 MG capsule Take 1 po qd post coital (Patient not taking: Reported on 02/22/2014) 90 capsule 1   No current facility-administered medications for this visit.    Past Medical History  Diagnosis Date  . Hypertension   . Cancer     cervical/rad hysterectomy/bso wiht chemo for small cell ca  . Atrial fibrillation   . Anemia     borderline    Past Surgical History  Procedure Laterality Date  . Radical abdominal hysterectomy  1986    with BSO    Family History  Problem Relation Age of Onset  . Diabetes Mother   . Hypertension Mother   . Skin cancer Father   . Lung cancer Mother   . Cancer Paternal Grandfather     unknown type  . CVA Father     History   Social History  . Marital Status: Married    Spouse Name: N/A    Number of Children: N/A  . Years of Education: N/A   Occupational History  . Not on file.   Social History Main Topics  . Smoking status: Never Smoker   . Smokeless tobacco: Never Used  . Alcohol Use: Yes  . Drug Use: No  . Sexual Activity:    Partners: Male   Other Topics Concern  . Not on file  Social History Narrative    ROS: See history of present illness otherwise negative  BP 100/60 mmHg  Pulse 86  Ht 5\' 3"  (1.6 m)  Wt 210 lb (95.255 kg)  BMI 37.21 kg/m2  LMP 03/19/1984  PHYSICAL EXAM: Well-nournished, in no acute distress. Neck: No JVD, HJR, Bruit, or thyroid enlargement  Lungs: No tachypnea, clear without wheezing, rales, or rhonchi  Cardiovascular: Irregular irregular, PMI not displaced, Normal S1 and S2, no murmurs, gallops, bruit, thrill, or heave.  Abdomen: BS normal. Soft without organomegaly, masses, lesions or tenderness.  Extremities:  Chronic lymphedema in the right lower extremity otherwise without cyanosis, clubbing. Good distal pulses bilateral  SKin: Warm, no lesions or rashes   Musculoskeletal: No deformities  Neuro: no focal signs   Wt Readings from Last 3 Encounters:  02/22/14 210 lb (95.255 kg)  10/01/11 174 lb 4.8 oz (79.062 kg)     Recent Labs    Lab Results  Component Value Date   WBC 5.5 10/02/2011   HGB 10.0* 10/02/2011   HCT 30.3* 10/02/2011   PLT 234 10/02/2011   GLUCOSE 87 03/02/2013   CHOL 204* 03/02/2013   TRIG 180* 03/02/2013   HDL 66 03/02/2013   LDLCALC 102* 03/02/2013   ALT 8 03/02/2013   AST 20 03/02/2013   NA 136 03/02/2013   K 4.3 03/02/2013   CL 98 03/02/2013   CREATININE 0.91 03/02/2013   BUN 17 03/02/2013   CO2 28 03/02/2013   TSH 2.430 03/02/2013   INR 0.91 10/01/2011      EKG: Atrial fib at 86 bpm nonspecific ST-T wave changes, no acute change  2-D echo 10/01/11 Study Conclusions  - Left ventricle: The cavity size was normal. Systolic function was normal. The estimated ejection fraction was in the range of 55% to 60%. Wall motion was normal; there were no regional wall motion abnormalities. - Mitral valve: Mild regurgitation.             PAF (paroxysmal atrial fibrillation) - Savannah Burn, PA-C at 02/22/2014 1:50 PM     Status: Written Related Problem: PAF (paroxysmal atrial fibrillation)   Expand All Collapse All   Patient is back in atrial fibrillation and has probably been in for over a month. I discussed this patient with Dr. Luther Hill who agrees to start her on Eliquis 5 mg twice a day after we check her renal function today. Increase diltiazem to 120 mg twice a day. She may not tolerate this dose with her low blood pressure. We will schedule her for cardioversion in 4 weeks. Check 2-D echo in the meantime. We'll check labs including TSH today. Decrease  alcohol intake.            Alcohol abuse - Savannah Burn, PA-C at 02/22/2014 1:50 PM     Status: Written Related Problem: Alcohol abuse   Expand All Collapse All   Discussed decreasing alcohol intake.

## 2014-02-22 NOTE — Assessment & Plan Note (Signed)
Discussed decreasing alcohol intake 

## 2014-02-22 NOTE — Patient Instructions (Addendum)
Your physician has requested that you have an echocardiogram. Echocardiography is a painless test that uses sound waves to create images of your heart. It provides your doctor with information about the size and shape of your heart and how well your heart's chambers and valves are working. This procedure takes approximately one hour. There are no restrictions for this procedure.  Your physician recommends that you have lab work today: BMP, CBC, TSH and LIVER  Your physician has recommended you make the following change in your medication:  1. START Eliquis 5mg  take one by mouth twice a day 2. INCREASE Diltiazem to 120mg  take one by mouth twice a day 3. STOP Aspirin  Your physician has recommended that you have a Cardioversion (DCCV) in 4 WEEKS. Electrical Cardioversion uses a jolt of electricity to your heart either through paddles or wired patches attached to your chest. This is a controlled, usually prescheduled, procedure. Defibrillation is done under light anesthesia in the hospital, and you usually go home the day of the procedure. This is done to get your heart back into a normal rhythm. You are not awake for the procedure. Please see the instruction sheet given to you today.  Your physician recommends that you schedule a follow-up appointment in: [place] with Dr Marlou Porch

## 2014-02-24 ENCOUNTER — Telehealth: Payer: Self-pay | Admitting: *Deleted

## 2014-02-24 ENCOUNTER — Ambulatory Visit (HOSPITAL_COMMUNITY): Payer: BC Managed Care – PPO | Attending: Cardiovascular Disease | Admitting: Radiology

## 2014-02-24 ENCOUNTER — Other Ambulatory Visit: Payer: Self-pay | Admitting: *Deleted

## 2014-02-24 DIAGNOSIS — I4891 Unspecified atrial fibrillation: Secondary | ICD-10-CM | POA: Diagnosis not present

## 2014-02-24 DIAGNOSIS — I480 Paroxysmal atrial fibrillation: Secondary | ICD-10-CM

## 2014-02-24 MED ORDER — APIXABAN 5 MG PO TABS: 5.0000 mg | ORAL_TABLET | Freq: Two times a day (BID) | ORAL | Status: DC

## 2014-02-24 NOTE — Progress Notes (Signed)
Echocardiogram performed.  

## 2014-02-24 NOTE — Telephone Encounter (Signed)
PA for Eliquis was faxed to [facility].

## 2014-02-24 NOTE — Telephone Encounter (Signed)
PA on Eliquis approved through 02/24/2015. Patient called and made aware.

## 2014-03-25 ENCOUNTER — Encounter (HOSPITAL_COMMUNITY): Payer: Self-pay | Admitting: Anesthesiology

## 2014-03-25 ENCOUNTER — Encounter (HOSPITAL_COMMUNITY)
Admission: RE | Disposition: A | Discharge status: Home / Self Care | Payer: Self-pay | Source: Ambulatory Visit | Attending: Cardiology

## 2014-03-25 ENCOUNTER — Ambulatory Visit (HOSPITAL_COMMUNITY)
Admission: RE | Admit: 2014-03-25 | Discharge: 2014-03-25 | Disposition: A | Discharge status: Home / Self Care | Payer: BLUE CROSS/BLUE SHIELD | Source: Ambulatory Visit | Attending: Cardiology | Admitting: Cardiology

## 2014-03-25 DIAGNOSIS — I4891 Unspecified atrial fibrillation: Secondary | ICD-10-CM | POA: Insufficient documentation

## 2014-03-25 SURGERY — CANCELLED PROCEDURE
Anesthesia: Monitor Anesthesia Care

## 2014-03-25 MED ORDER — SODIUM CHLORIDE 0.9 % IV SOLN
INTRAVENOUS | Status: DC
Start: 2014-03-25 — End: 2014-03-25

## 2014-03-25 NOTE — Progress Notes (Signed)
Patient admitted to ENDO unit. Patient placed on monitor and sinus rhythm was current reading. 12 lead EKG performed with a reading of NSR with 1st degree block. EKG reviewed by Dr. Marlou Porch and agreed with reading. Patient informed she could return home and should call if she feels heart goes back out of rhythm to call office. Instructions to take medications as normal and to check heart rate daily. Patient agreed.

## 2014-03-25 NOTE — Anesthesia Preprocedure Evaluation (Deleted)
Anesthesia Evaluation  Patient identified by MRN, date of birth, ID band Patient awake    Reviewed: Allergy & Precautions, NPO status , Patient's Chart, lab work & pertinent test results, reviewed documented beta blocker date and time   Airway Mallampati: II   Neck ROM: Full    Dental  (+) Teeth Intact   Pulmonary  breath sounds clear to auscultation        Cardiovascular hypertension, + dysrhythmias Atrial Fibrillation Rhythm:Regular  ECHO 09/2011 EF 60%   Neuro/Psych Anxiety    GI/Hepatic   Endo/Other    Renal/GU Renal InsufficiencyRenal diseaseGFR 51     Musculoskeletal   Abdominal (+)  Abdomen: soft.    Peds  Hematology 12/39 H/H   Anesthesia Other Findings Pulse regular, suspect sinus rhythm, 12 lead to be obtained  Reproductive/Obstetrics                            Anesthesia Physical Anesthesia Plan  ASA: II  Anesthesia Plan: MAC   Post-op Pain Management:    Induction: Intravenous  Airway Management Planned: Nasal Cannula  Additional Equipment:   Intra-op Plan:   Post-operative Plan:   Informed Consent: I have reviewed the patients History and Physical, chart, labs and discussed the procedure including the risks, benefits and alternatives for the proposed anesthesia with the patient or authorized representative who has indicated his/her understanding and acceptance.     Plan Discussed with:   Anesthesia Plan Comments:         Anesthesia Quick Evaluation

## 2014-03-25 NOTE — H&P (Signed)
  This is a 64 year old female patient of Dr. Marlou Porch who has history of paroxysmal atrial fibrillation that converted to normal sinus rhythm in July 2013. Her nuclear stress test was negative for ischemia. Chest CT with interstitial fibrosis no PE. Echo EF 55-60% with no wall motion abnormality. She was treated with diltiazem 120 mg daily. She was also noted to have heavy alcohol consumption. She was last seen here in April 2015 at which time she was doing well.  She was recently diagnosed to be back in atrial fibrillation and is symptomatic with DOE. Today she is coming for a DCCV.    Dorothy Spark 03/25/2014

## 2014-04-02 ENCOUNTER — Ambulatory Visit (INDEPENDENT_AMBULATORY_CARE_PROVIDER_SITE_OTHER): Payer: BLUE CROSS/BLUE SHIELD | Admitting: Obstetrics & Gynecology

## 2014-04-02 ENCOUNTER — Encounter: Payer: Self-pay | Admitting: Obstetrics & Gynecology

## 2014-04-02 ENCOUNTER — Telehealth: Payer: Self-pay | Admitting: *Deleted

## 2014-04-02 VITALS — BP 124/78 | HR 64 | Resp 16 | Ht 63.0 in | Wt 183.6 lb

## 2014-04-02 DIAGNOSIS — Z01419 Encounter for gynecological examination (general) (routine) without abnormal findings: Secondary | ICD-10-CM

## 2014-04-02 DIAGNOSIS — Z124 Encounter for screening for malignant neoplasm of cervix: Secondary | ICD-10-CM

## 2014-04-02 DIAGNOSIS — Z0000 Encounter for general adult medical examination without abnormal findings: Secondary | ICD-10-CM

## 2014-04-02 LAB — POCT URINALYSIS DIPSTICK
BILIRUBIN UA: NEGATIVE
Blood, UA: NEGATIVE
GLUCOSE UA: NEGATIVE
Ketones, UA: NEGATIVE
Leukocytes, UA: NEGATIVE
Nitrite, UA: NEGATIVE
PROTEIN UA: NEGATIVE
Urobilinogen, UA: NEGATIVE
pH, UA: 5.5

## 2014-04-02 LAB — LIPID PANEL
CHOLESTEROL: 171 mg/dL (ref 0–200)
HDL: 51 mg/dL (ref >39)
LDL Cholesterol: 105 mg/dL — ABNORMAL HIGH (ref 0–99)
Total CHOL/HDL Ratio: 3.4 Ratio
Triglycerides: 76 mg/dL (ref <150)
VLDL: 15 mg/dL (ref 0–40)

## 2014-04-02 MED ORDER — ESTRADIOL 0.5 MG PO TABS: 0.5000 mg | ORAL_TABLET | Freq: Every day | ORAL | Status: DC

## 2014-04-02 MED ORDER — ALPRAZOLAM 0.5 MG PO TABS
0.5000 mg | ORAL_TABLET | Freq: Every day | ORAL | Status: DC | PRN
Start: 2014-04-02 — End: 2015-06-03

## 2014-04-02 MED ORDER — CITALOPRAM HYDROBROMIDE 20 MG PO TABS: 20.0000 mg | ORAL_TABLET | Freq: Every day | ORAL | Status: DC

## 2014-04-02 NOTE — Telephone Encounter (Signed)
Calling patient in regards to BMD and Mammogram Appointment that I had made for her.  Left Message To Call Back

## 2014-04-02 NOTE — Progress Notes (Signed)
64 y.o. G1P1 MarriedCaucasianF here for annual exam.  Pt having some issues with paroxymal atrial fibrillation.  Was seen in December when she was in a fib.  Was placed on blood thinner.  Was scheduled for cardioversion but went back into sinus rhythm.    Moved parents from [place] here in the fall.  This has been very stressful for patient.  Father has dementia issues.  They are living independently here, for now.    Patient's last menstrual period was 03/19/1984.          Sexually active: Yes.    The current method of family planning is status post hysterectomy.    Exercising: No.  not regularly Smoker:  yes  Health Maintenance: Pap:  03/02/13 WNL/negative HR HPV History of abnormal Pap:  yes MMG:  09/24/13 3D-normal Colonoscopy:  2015-Dr Mann repeat in 7 years.  Note are in EPIC.  BMD:   4/10, 1.0/0.0 TDaP:  11/12 Screening Labs: labs with cardiology in 12/05.  Needs lipids , Hb today: 12.7, Urine today: PH-5.5   reports that she has never smoked. She has never used smokeless tobacco. She reports that she drinks about 2.4 - 3.0 oz of alcohol per week. She reports that she does not use illicit drugs.  Past Medical History  Diagnosis Date  . Hypertension   . Cancer     cervical/rad hysterectomy/bso wiht chemo for small cell ca  . Atrial fibrillation   . Anemia     borderline    Past Surgical History  Procedure Laterality Date  . Radical abdominal hysterectomy  1986    with BSO    Current Outpatient Prescriptions  Medication Sig Dispense Refill  . ALPRAZolam (XANAX) 0.5 MG tablet Take 1 tablet (0.5 mg total) by mouth daily as needed. ANXIETY 30 tablet 0  . apixaban (ELIQUIS) 5 MG TABS tablet Take 1 tablet (5 mg total) by mouth 2 (two) times daily. 60 tablet 0  . citalopram (CELEXA) 20 MG tablet Take 20 mg by mouth daily.    [name] Kitchen diltiazem (CARDIZEM CD) 120 MG 24 hr capsule Take 1 capsule (120 mg total) by mouth 2 (two) times daily. 180 capsule 3  . estradiol (ESTRACE) 0.5 MG  tablet Take 1 tablet (0.5 mg total) by mouth daily. 90 tablet 1  . FLUZONE SUSP     . furosemide (LASIX) 20 MG tablet Take 1 tablet (20 mg total) by mouth daily. 30 tablet 6  . Multiple Vitamin (MULTIVITAMIN WITH MINERALS) TABS Take 1 tablet by mouth daily.    . nitrofurantoin, macrocrystal-monohydrate, (MACROBID) 100 MG capsule Take 1 po qd post coital (Patient taking differently: Take 100 mg by mouth daily as needed. Take 1 po qd post coital) 90 capsule 1  . triamcinolone (NASACORT) 55 MCG/ACT AERO nasal inhaler Place 1 spray into the nose daily as needed (for congestion).     No current facility-administered medications for this visit.    Family History  Problem Relation Age of Onset  . Diabetes Mother   . Hypertension Mother   . Skin cancer Father   . Lung cancer Mother   . Cancer Paternal Grandfather     unknown type  . CVA Father     ROS:  Pertinent items are noted in HPI.  Otherwise, a comprehensive ROS was negative.  Exam:   BP 124/78 mmHg  Pulse 64  Resp 16  Ht 5\' 3"  (1.6 m)  Wt 183 lb 9.6 oz (83.28 kg)  BMI 32.53 kg/m2  LMP 03/19/1984   Height: 5\' 3"  (160 cm)  Ht Readings from Last 3 Encounters:  04/02/14 5\' 3"  (1.6 m)  02/22/14 5\' 3"  (1.6 m)  07/01/13 5\' 3"  (1.6 m)    General appearance: alert, cooperative and appears stated age Head: Normocephalic, without obvious abnormality, atraumatic Neck: no adenopathy, supple, symmetrical, trachea midline and thyroid normal to inspection and palpation Lungs: clear to auscultation bilaterally Breasts: normal appearance, no masses or tenderness Heart: regular rate and rhythm Abdomen: soft, non-tender; bowel sounds normal; no masses,  no organomegaly Extremities: extremities normal, atraumatic, no cyanosis or edema Skin: Skin color, texture, turgor normal. No rashes or lesions Lymph nodes: Cervical, supraclavicular, and axillary nodes normal. No abnormal inguinal nodes palpated Neurologic: Grossly normal   Pelvic:  External genitalia:  no lesions              Urethra:  normal appearing urethra with no masses, tenderness or lesions              Bartholins and Skenes: normal                 Vagina: normal appearing vagina with normal color and discharge, no lesions              Cervix: no lesions              Pap taken: No. Bimanual Exam:  Uterus:  normal size, contour, position, consistency, mobility, non-tender              Adnexa: normal adnexa and no mass, fullness, tenderness               Rectovaginal: Confirms               Anus:  normal sphincter tone, no lesions  Chaperone was present for exam.  A:  Well Woman with normal exam PMP, on HRT H/O radical hysterectomy/BSO 1986 due to cervical cancer Mild anxiety Increased stressors due to parents moving to [place]  P: Mammogram yearly On estradiol 0.5mg  daily.  #90/4 RF Xanax 0.5mg  prn #30/1RF Celex 20mg  dailiy.  90/4RF Lipids today pap smear obtained Will have her BMD and MMG scheduled and she will be called with this information. return annually or prn

## 2014-04-05 LAB — IPS PAP TEST WITH REFLEX TO HPV

## 2014-04-07 NOTE — Telephone Encounter (Signed)
Called patient to notify her of appointment she said that [place] notified her and sent her a email.  Routed to provider for review, encounter closed.

## 2014-04-08 ENCOUNTER — Telehealth: Payer: Self-pay | Admitting: Obstetrics & Gynecology

## 2014-04-08 ENCOUNTER — Telehealth: Payer: Self-pay

## 2014-04-08 NOTE — Telephone Encounter (Signed)
-----   Message from Lyman Speller, MD sent at 04/07/2014 10:01 AM EST ----- Pap 02 recall.  Inform lipids are fine.  Total was 171 and LDL mildly elevated at 105 but HDS good, TGs good, ratio good.

## 2014-04-08 NOTE — Telephone Encounter (Signed)
Pt returned Kelly's call from previous phone message. Accidentally closed encounter. Please see previous message to return pt call.

## 2014-04-08 NOTE — Telephone Encounter (Signed)
Spoke with patient/all results given. Has AEX scheduled 3/17//kn

## 2014-04-08 NOTE — Telephone Encounter (Signed)
Lmtcb//kn 

## 2014-04-08 NOTE — Telephone Encounter (Signed)
Pt returned call to [place]

## 2014-04-09 ENCOUNTER — Ambulatory Visit: Payer: BC Managed Care – PPO | Admitting: Cardiology

## 2014-04-12 ENCOUNTER — Encounter: Payer: Self-pay | Admitting: Cardiology

## 2014-04-12 ENCOUNTER — Ambulatory Visit (INDEPENDENT_AMBULATORY_CARE_PROVIDER_SITE_OTHER): Payer: BLUE CROSS/BLUE SHIELD | Admitting: Cardiology

## 2014-04-12 ENCOUNTER — Ambulatory Visit: Payer: BLUE CROSS/BLUE SHIELD | Admitting: Cardiology

## 2014-04-12 VITALS — BP 120/78 | HR 62 | Ht 63.0 in | Wt 184.0 lb

## 2014-04-12 DIAGNOSIS — I444 Left anterior fascicular block: Secondary | ICD-10-CM

## 2014-04-12 DIAGNOSIS — I480 Paroxysmal atrial fibrillation: Secondary | ICD-10-CM

## 2014-04-12 DIAGNOSIS — F1010 Alcohol abuse, uncomplicated: Secondary | ICD-10-CM

## 2014-04-12 NOTE — Progress Notes (Signed)
[facility]. [address] [place], [place]  [address] Phone: [phone] Fax:  [phone]  Date:  04/12/2014   ID:  Savannah Hill, DOB 10-29-1950, MRN [id number]  PCP:  Osborne Casco, MD   History of Present Illness: Savannah Hill is a 64 y.o. female with paroxysmal atrial fibrillation and was given cardizem drip and spontaneously converted to NSR during July 2013 hospitalization. Nuclear stress test negative for ischemia, Chest CT with interstial fibrosis, no pulmonary emboli. Echocardiogram with EF 55-60% mild MR and no wall motion abnormality. She was discharged on Diltiazem 120 mg po qd. She is noted to have heavy alcohol consumption.  Had another bout of paroxysmal atrial fibrillation in early January 2016 which spontaneously converted after taking diltiazem once again.   Overall she is doing well. Very brief episodes of palpitations but no significant lengthy episodes. She is also [employer]. She was under a significant amount of stress during her episodes of atrial fibrillation.     Wt Readings from Last 3 Encounters:  04/12/14 184 lb (83.462 kg)  04/02/14 183 lb 9.6 oz (83.28 kg)  02/22/14 210 lb (95.255 kg)     Past Medical History  Diagnosis Date  . Hypertension   . Cancer     cervical/rad hysterectomy/bso wiht chemo for small cell ca  . Atrial fibrillation   . Anemia     borderline    Past Surgical History  Procedure Laterality Date  . Radical abdominal hysterectomy  1986    with BSO    Current Outpatient Prescriptions  Medication Sig Dispense Refill  . ALPRAZolam (XANAX) 0.5 MG tablet Take 1 tablet (0.5 mg total) by mouth daily as needed. ANXIETY 30 tablet 1  . apixaban (ELIQUIS) 5 MG TABS tablet Take 1 tablet (5 mg total) by mouth 2 (two) times daily. 60 tablet 0  . citalopram (CELEXA) 20 MG tablet Take 1 tablet (20 mg total) by mouth daily. 90 tablet 4  . diltiazem (CARDIZEM CD) 120 MG 24 hr capsule Take 1  capsule (120 mg total) by mouth 2 (two) times daily. 180 capsule 3  . estradiol (ESTRACE) 0.5 MG tablet Take 1 tablet (0.5 mg total) by mouth daily. 90 tablet 4  . FLUZONE SUSP     . furosemide (LASIX) 20 MG tablet Take 1 tablet (20 mg total) by mouth daily. 30 tablet 6  . Multiple Vitamin (MULTIVITAMIN WITH MINERALS) TABS Take 1 tablet by mouth daily.    . nitrofurantoin, macrocrystal-monohydrate, (MACROBID) 100 MG capsule Take 1 po qd post coital (Patient taking differently: Take 100 mg by mouth daily as needed. Take 1 po qd post coital) 90 capsule 1  . triamcinolone (NASACORT) 55 MCG/ACT AERO nasal inhaler Place 1 spray into the nose daily as needed (for congestion).     No current facility-administered medications for this visit.    Allergies:   No Known Allergies  Social History:  The patient  reports that she has never smoked. She has never used smokeless tobacco. She reports that she drinks about 2.4 - 3.0 oz of alcohol per week. She reports that she does not use illicit drugs.   ROS:  Please see the history of present illness.   No syncope, no bleeding, no orthopnea, no PND  PHYSICAL EXAM: VS:  BP 120/78 mmHg  Pulse 62  Ht 5\' 3"  (1.6 m)  Wt 184 lb (83.462 kg)  BMI 32.60 kg/m2  LMP 03/19/1984 Well nourished, well  developed, in no acute distress HEENT: normal Neck: no JVD Cardiac:  normal S1, S2; RRR; no murmur Lungs:  clear to auscultation bilaterally, no wheezing, rhonchi or rales Abd: soft, nontender, no hepatomegaly Ext: RLE lymphedema. Skin: warm and dry Neuro: no focal abnormalities noted  EKG:  04/12/14-sinus rhythm, first-degree AV block, heart rate 62, PR interval 240 ms. Inferior infarct pattern. 07/01/13-Sinus rhythm, borderline first degree AV block, heart rate 62, poor R wave progression, left anterior fascicular block     ASSESSMENT AND PLAN:  1. Paroxysmal atrial fibrillation- recent episode. We discussed risk factors of alcohol, weight. Continue with  diltiazem. CHADS-VAS (2) Female, HTN. Discussed cessation of alcohol length. Husband with her. Increased stress with her father in town. If atrial fibrillation returns, I would like for her to discuss further therapies with Dr. Rayann Heman. 2. Lymphedema lower extremity-chronic. She saw a physician in [place] that does lymph node transplantation and she did not think she would be a good candidate for this procedure. 3. LAFB - no syncope. Monitor.   Signed, Candee Furbish, MD [facility]  04/12/2014 1:42 PM

## 2014-04-12 NOTE — Patient Instructions (Signed)
The current medical regimen is effective;  continue present plan and medications.  Follow up in 6 months with Dr. Skains.  You will receive a letter in the mail 2 months before you are due.  Please call us when you receive this letter to schedule your follow up appointment.  Thank you for choosing [facility]!!     

## 2014-05-14 ENCOUNTER — Other Ambulatory Visit: Payer: Self-pay | Admitting: Obstetrics & Gynecology

## 2014-05-14 MED ORDER — NITROFURANTOIN MONOHYD MACRO 100 MG PO CAPS
ORAL_CAPSULE | ORAL | Status: DC
Start: 2014-05-14 — End: 2015-07-01

## 2014-05-14 NOTE — Telephone Encounter (Signed)
Medication refill request: Macrobid Last AEX:  04/02/14 SM Next AEX: 06/03/15 Last MMG (if hormonal medication request): 09/24/13 BIRADS2:benign  Refill authorized: 11/04/13 #90/1R. Today ?

## 2014-05-14 NOTE — Telephone Encounter (Signed)
Pt needs a refill for [facility]

## 2014-10-04 ENCOUNTER — Telehealth: Payer: Self-pay | Admitting: *Deleted

## 2014-10-04 NOTE — Telephone Encounter (Signed)
Patient notified of results. - verbalized understanding.

## 2014-10-04 NOTE — Telephone Encounter (Signed)
Return call to Reina °

## 2014-10-04 NOTE — Telephone Encounter (Signed)
LM for pt to call back re: Bone Density result  "Bone Density is totally normal. Repeat 3-5 years. Try to get 1200 mg of calcium and 800 units of vitamin D." - Per Dr. [name] Heck.   Report to scan.

## 2015-02-25 ENCOUNTER — Telehealth: Payer: Self-pay

## 2015-02-25 NOTE — Telephone Encounter (Signed)
Prior auth obtained for Eliquis 5 mg through [facility]

## 2015-02-28 ENCOUNTER — Telehealth: Payer: Self-pay

## 2015-02-28 NOTE — Telephone Encounter (Signed)
Approval of Eliquis from [facility], good through 02/25/2016. Case ID # [id number].

## 2015-03-14 ENCOUNTER — Other Ambulatory Visit: Payer: Self-pay | Admitting: Physician Assistant

## 2015-04-21 ENCOUNTER — Other Ambulatory Visit: Payer: Self-pay | Admitting: Obstetrics & Gynecology

## 2015-04-21 NOTE — Telephone Encounter (Signed)
Medication refill request: Estrace tab Last AEX:  04/02/14 MSM Next AEX: 06/03/15 MSM Last MMG (if hormonal medication request): 09/24/2014 BIRADS Category 1 Negative Refill authorized: 04/02/14 #90 tabs 4 Refills  Today: #90 tabs 0 Refills ? Please advise

## 2015-05-03 ENCOUNTER — Telehealth: Payer: Self-pay | Admitting: Cardiology

## 2015-05-03 NOTE — Telephone Encounter (Signed)
New Message  Pt calling to speak w/ rn- concerning her meds in relation to her low BP- was told to come of lasix if BP went below 100- has been off for 2 weeks. Please call back and discuss.

## 2015-05-03 NOTE — Telephone Encounter (Signed)
Patient st she has been on the [employer] Diet for 4.5 weeks and has lost 16.4 pounds to date (she weighed 168.2 lbs this AM). She has been following closely with her PCP as this diet has shown to lower BP and cholesterol. She was told to hold lasix if SBP<100. She has not taken Lasix in 2 weeks. She spoke with an [place] recently and learned Cartia can lower BP as well.  Patient is calling to see if Dr. Marlou Porch would recommend medication dose changes.  She would like to stay on low-dose lasix for lymphedema and understands she needs Cartia for afib, but does not want to tank her BP. Last BP readings: 2/9:   85/59 (she reports a mild lightheadedness at this time) 2/10: 98/62 2/11: 95/65 2/12: 107/74 2/13: 90/71 She reports no symptoms other than noted. She reports she is staying well hydrated. The diet calls for 90 oz of H2O daily.  To Dr. Marlou Porch for recommendations.

## 2015-05-04 NOTE — Telephone Encounter (Signed)
Stop diltiazem. If atrial fibrillation returns, take diltiazem in that setting. With such hypotension, I would consider continuing holding the Lasix as well.  Candee Furbish, MD

## 2015-05-04 NOTE — Telephone Encounter (Signed)
Pt aware of Dr Marlou Porch orders and will stop the Diltiazem.  She will continue to hold Furosemide. She will call back if further questions or concerns

## 2015-05-30 ENCOUNTER — Other Ambulatory Visit: Payer: Self-pay | Admitting: Physician Assistant

## 2015-06-03 ENCOUNTER — Ambulatory Visit (INDEPENDENT_AMBULATORY_CARE_PROVIDER_SITE_OTHER): Payer: BLUE CROSS/BLUE SHIELD | Admitting: Obstetrics & Gynecology

## 2015-06-03 ENCOUNTER — Encounter: Payer: Self-pay | Admitting: Obstetrics & Gynecology

## 2015-06-03 VITALS — BP 110/80 | HR 84 | Resp 16 | Ht 63.0 in | Wt 162.0 lb

## 2015-06-03 DIAGNOSIS — Z205 Contact with and (suspected) exposure to viral hepatitis: Secondary | ICD-10-CM | POA: Diagnosis not present

## 2015-06-03 DIAGNOSIS — Z124 Encounter for screening for malignant neoplasm of cervix: Secondary | ICD-10-CM

## 2015-06-03 DIAGNOSIS — Z90710 Acquired absence of both cervix and uterus: Secondary | ICD-10-CM | POA: Diagnosis not present

## 2015-06-03 DIAGNOSIS — Z0000 Encounter for general adult medical examination without abnormal findings: Secondary | ICD-10-CM

## 2015-06-03 DIAGNOSIS — Z01419 Encounter for gynecological examination (general) (routine) without abnormal findings: Secondary | ICD-10-CM | POA: Diagnosis not present

## 2015-06-03 DIAGNOSIS — Z8541 Personal history of malignant neoplasm of cervix uteri: Secondary | ICD-10-CM

## 2015-06-03 LAB — COMPREHENSIVE METABOLIC PANEL
ALT: 11 U/L (ref 6–29)
AST: 25 U/L (ref 10–35)
Albumin: 3.9 g/dL (ref 3.6–5.1)
Alkaline Phosphatase: 42 U/L (ref 33–130)
BUN: 13 mg/dL (ref 7–25)
CALCIUM: 9.3 mg/dL (ref 8.6–10.4)
CO2: 27 mmol/L (ref 20–31)
Chloride: 100 mmol/L (ref 98–110)
Creat: 0.84 mg/dL (ref 0.50–0.99)
GLUCOSE: 84 mg/dL (ref 65–99)
POTASSIUM: 4.4 mmol/L (ref 3.5–5.3)
Sodium: 138 mmol/L (ref 135–146)
Total Bilirubin: 0.6 mg/dL (ref 0.2–1.2)
Total Protein: 6.6 g/dL (ref 6.1–8.1)

## 2015-06-03 LAB — POCT URINALYSIS DIPSTICK
Bilirubin, UA: NEGATIVE
Blood, UA: NEGATIVE
GLUCOSE UA: NEGATIVE
LEUKOCYTES UA: NEGATIVE
Nitrite, UA: NEGATIVE
PROTEIN UA: NEGATIVE
UROBILINOGEN UA: NEGATIVE
pH, UA: 5

## 2015-06-03 LAB — LIPID PANEL
CHOL/HDL RATIO: 4.8 ratio (ref <=5.0)
Cholesterol: 213 mg/dL — ABNORMAL HIGH (ref 125–200)
HDL: 44 mg/dL — AB (ref >=46)
LDL CALC: 148 mg/dL — AB (ref <130)
TRIGLYCERIDES: 107 mg/dL (ref <150)
VLDL: 21 mg/dL (ref <30)

## 2015-06-03 LAB — TSH: TSH: 2.23 m[IU]/L

## 2015-06-03 MED ORDER — ALPRAZOLAM 0.5 MG PO TABS: 0.5000 mg | ORAL_TABLET | Freq: Every day | ORAL | Status: DC | PRN

## 2015-06-03 MED ORDER — CITALOPRAM HYDROBROMIDE 20 MG PO TABS: 20.0000 mg | ORAL_TABLET | Freq: Every day | ORAL | Status: DC

## 2015-06-03 MED ORDER — ESTRADIOL 0.5 MG PO TABS: 0.5000 mg | ORAL_TABLET | Freq: Every day | ORAL | Status: DC

## 2015-06-03 NOTE — Progress Notes (Signed)
65 y.o. G1P1 MarriedCaucasianF here for annual exam.  Doing weight loss.  Is down about 20#.  Using [employer] for this.    Denies vaginal bleeding.  No new medical issues.  She is going to [place] for trip.  Has anxiety with flying.  Patient's last menstrual period was 03/19/1984.          Sexually active: Yes.    The current method of family planning is status post hysterectomy.    Exercising: No.  The patient does not participate in regular exercise at present. Smoker:  no  Health Maintenance: Pap:  04/02/14 Neg. 03/02/13 Neg. HR HPV:neg History of abnormal Pap:  yes MMG:  09/24/14 BIRADS1:neg Colonoscopy:  10/16/13 Polyps - repeat 7 years   BMD: 09/24/14 Normal    TDaP:  01/2011   Has done shingles vaccine Screening Labs: PCP, Urine today: PCP   reports that she has never smoked. She has never used smokeless tobacco. She reports that she drinks about 2.4 - 3.0 oz of alcohol per week. She reports that she does not use illicit drugs.  Past Medical History  Diagnosis Date  . Hypertension   . Cancer [facility])     cervical/rad hysterectomy/bso wiht chemo for small cell ca  . Atrial fibrillation ([place])   . Anemia     borderline    Past Surgical History  Procedure Laterality Date  . Radical abdominal hysterectomy  1986    with BSO    Current Outpatient Prescriptions  Medication Sig Dispense Refill  . ALPRAZolam (XANAX) 0.5 MG tablet Take 1 tablet (0.5 mg total) by mouth daily as needed. ANXIETY 30 tablet 1  . apixaban (ELIQUIS) 5 MG TABS tablet Take 1 tablet (5 mg total) by mouth 2 (two) times daily. Please call and schedule appt for additional refills 30 tablet 0  . citalopram (CELEXA) 20 MG tablet Take 1 tablet (20 mg total) by mouth daily. 90 tablet 4  . estradiol (ESTRACE) 0.5 MG tablet TAKE 1 TABLET DAILY 90 tablet 0  . Multiple Vitamin (MULTIVITAMIN WITH MINERALS) TABS Take 1 tablet by mouth daily.    . nitrofurantoin, macrocrystal-monohydrate, (MACROBID) 100 MG capsule  Take 1 po qd post coital 90 capsule 1   No current facility-administered medications for this visit.    Family History  Problem Relation Age of Onset  . Diabetes Mother   . Hypertension Mother   . Skin cancer Father   . Lung cancer Mother   . Cancer Paternal Grandfather     unknown type  . CVA Father     ROS:  Pertinent items are noted in HPI.  Otherwise, a comprehensive ROS was negative.  Exam:   BP 110/80 mmHg  Pulse 84  Resp 16  Ht 5\' 3"  (1.6 m)  Wt 162 lb (73.483 kg)  BMI 28.70 kg/m2  LMP 03/19/1984  Weight change: -21#   Height: 5\' 3"  (160 cm)  Ht Readings from Last 3 Encounters:  06/03/15 5\' 3"  (1.6 m)  04/12/14 5\' 3"  (1.6 m)  04/02/14 5\' 3"  (1.6 m)    General appearance: alert, cooperative and appears stated age Head: Normocephalic, without obvious abnormality, atraumatic Neck: no adenopathy, supple, symmetrical, trachea midline and thyroid normal to inspection and palpation Lungs: clear to auscultation bilaterally Breasts: normal appearance, no masses or tenderness Heart: regular rate and rhythm Abdomen: soft, non-tender; bowel sounds normal; no masses,  no organomegaly Extremities: extremities normal, atraumatic, no cyanosis or edema Skin: Skin color, texture, turgor normal. No  rashes or lesions Lymph nodes: Cervical, supraclavicular, and axillary nodes normal. No abnormal inguinal nodes palpated Neurologic: Grossly normal   Pelvic: External genitalia:  no lesions              Urethra:  normal appearing urethra with no masses, tenderness or lesions              Bartholins and Skenes: normal                 Vagina: normal appearing vagina with normal color and discharge, no lesions              Cervix: absent              Pap taken: Yes.   Bimanual Exam:  Uterus:  uterus absent              Adnexa: no mass, fullness, tenderness               Rectovaginal: Confirms               Anus:  normal sphincter tone, no lesions  Chaperone was present for  exam.  A:  Well Woman with normal exam PMP, on HRT H/O radical hysterectomy/BSO 1986 due to cervical cancer Mild anxiety  P: Mammogram yearly On estradiol 0.5mg  daily. #90/4 RF.  Pt aware of risks and is desirous to continue with HRT Xanax 0.5mg  prn #30/1RF.  Pt uses for flying and never requests RF in a year. Celex 20mg  dailiy. #90/4RF Lipids, CMP, TSH today Hep C antibody today pap smear obtained return annually or prn

## 2015-06-03 NOTE — Progress Notes (Deleted)
65 y.o. G1P1 MarriedCaucasianF here for annual exam.    Patient's last menstrual period was 03/19/1984.          Sexually active: Yes.    The current method of family planning is status post hysterectomy.    Exercising: No.  The patient does not participate in regular exercise at present. Smoker:  no  Health Maintenance: Pap:  04/02/14 Neg. 03/02/13 Neg. HR HPV:neg History of abnormal Pap:  yes MMG:  09/24/14 BIRADS1:neg Colonoscopy:  10/16/13 Polyps - repeat 7 years   BMD: 09/24/14 Normal    TDaP:  01/2011  Screening Labs: PCP, Urine today: PCP   reports that she has never smoked. She has never used smokeless tobacco. She reports that she drinks about 2.4 - 3.0 oz of alcohol per week. She reports that she does not use illicit drugs.  Past Medical History  Diagnosis Date  . Hypertension   . Cancer [facility])     cervical/rad hysterectomy/bso wiht chemo for small cell ca  . Atrial fibrillation ([place])   . Anemia     borderline    Past Surgical History  Procedure Laterality Date  . Radical abdominal hysterectomy  1986    with BSO    Current Outpatient Prescriptions  Medication Sig Dispense Refill  . ALPRAZolam (XANAX) 0.5 MG tablet Take 1 tablet (0.5 mg total) by mouth daily as needed. ANXIETY 30 tablet 1  . apixaban (ELIQUIS) 5 MG TABS tablet Take 1 tablet (5 mg total) by mouth 2 (two) times daily. Please call and schedule appt for additional refills 30 tablet 0  . citalopram (CELEXA) 20 MG tablet Take 1 tablet (20 mg total) by mouth daily. 90 tablet 4  . estradiol (ESTRACE) 0.5 MG tablet TAKE 1 TABLET DAILY 90 tablet 0  . Multiple Vitamin (MULTIVITAMIN WITH MINERALS) TABS Take 1 tablet by mouth daily.    . nitrofurantoin, macrocrystal-monohydrate, (MACROBID) 100 MG capsule Take 1 po qd post coital 90 capsule 1   No current facility-administered medications for this visit.    Family History  Problem Relation Age of Onset  . Diabetes Mother   . Hypertension Mother   . Skin cancer  Father   . Lung cancer Mother   . Cancer Paternal Grandfather     unknown type  . CVA Father     ROS:  Pertinent items are noted in HPI.  Otherwise, a comprehensive ROS was negative.  Exam:   BP 110/80 mmHg  Pulse 84  Resp 16  Ht 5\' 3"  (1.6 m)  Wt 162 lb (73.483 kg)  BMI 28.70 kg/m2  LMP 03/19/1984  Weight change: @WEIGHTCHANGE @ Height:   Height: 5\' 3"  (160 cm)  Ht Readings from Last 3 Encounters:  06/03/15 5\' 3"  (1.6 m)  04/12/14 5\' 3"  (1.6 m)  04/02/14 5\' 3"  (1.6 m)    General appearance: alert, cooperative and appears stated age Head: Normocephalic, without obvious abnormality, atraumatic Neck: no adenopathy, supple, symmetrical, trachea midline and thyroid {EXAM; THYROID:18604} Lungs: clear to auscultation bilaterally Breasts: {Exam; breast:13139::"normal appearance, no masses or tenderness"} Heart: regular rate and rhythm Abdomen: soft, non-tender; bowel sounds normal; no masses,  no organomegaly Extremities: extremities normal, atraumatic, no cyanosis or edema Skin: Skin color, texture, turgor normal. No rashes or lesions Lymph nodes: Cervical, supraclavicular, and axillary nodes normal. No abnormal inguinal nodes palpated Neurologic: Grossly normal   Pelvic: External genitalia:  no lesions              Urethra:  normal appearing  urethra with no masses, tenderness or lesions              Bartholins and Skenes: normal                 Vagina: normal appearing vagina with normal color and discharge, no lesions              Cervix: {exam; cervix:14595}              Pap taken: {yes no:314532} Bimanual Exam:  Uterus:  {exam; uterus:12215}              Adnexa: {exam; adnexa:12223}               Rectovaginal: Confirms               Anus:  normal sphincter tone, no lesions  Chaperone was present for exam.  A:  Well Woman with normal exam  P:   {plan; gyn:5269::"mammogram","pap smear","return annually or prn"}

## 2015-06-04 LAB — HEPATITIS C ANTIBODY: HCV Ab: NEGATIVE

## 2015-06-07 LAB — IPS PAP TEST WITH REFLEX TO HPV

## 2015-06-09 ENCOUNTER — Other Ambulatory Visit: Payer: Self-pay | Admitting: *Deleted

## 2015-06-09 MED ORDER — APIXABAN 5 MG PO TABS: 5.0000 mg | ORAL_TABLET | Freq: Two times a day (BID) | ORAL | Status: DC

## 2015-06-30 NOTE — Progress Notes (Signed)
Cardiology Office Note:    Date:  07/01/2015   ID:  Savannah Hill, DOB Oct 29, 1950, MRN [id number]  PCP:  Osborne Casco, MD  Cardiologist:  Dr. Candee Furbish   Electrophysiologist:  n/a  Referring MD: Kelton Pillar, MD   Chief Complaint  Patient presents with  . Atrial Fibrillation    Follow-up    History of Present Illness:     Savannah Hill is a 65 y.o. female with a hx of PAF, lymphedema.  She converted to NSR on diltiazem gtt in the hospital in 2013.  Nuc stress test was neg for ischemia.  EF is normal.  She had another bout of PAF in 1/16 and converted on diltiazem again.  CHADS2-VASc=2 (female, HTN).  She is on Eliquis for anticoagulation.   Last seen by Dr. Candee Furbish 1/16.  She called in in 2/17 with low BP after losing significant weight on a diet plan.  She was told to hold her Diltiazem and Lasix.   Returns for FU.  Here with her mom. She has occ palpitations.  Started taking Lasix again. RLE edema stable.  The patient denies chest pain, shortness of breath, syncope, orthopnea, PND.  Denies bleeding issues.      Past Medical History  Diagnosis Date  . Hypertension   . Cancer [facility])     cervical/rad hysterectomy/bso wiht chemo for small cell ca  . Atrial fibrillation ([place])   . Anemia     borderline    Past Surgical History  Procedure Laterality Date  . Radical abdominal hysterectomy  1986    with BSO    Current Medications: Outpatient Prescriptions Prior to Visit  Medication Sig Dispense Refill  . ALPRAZolam (XANAX) 0.5 MG tablet Take 1 tablet (0.5 mg total) by mouth daily as needed. ANXIETY 30 tablet 1  . apixaban (ELIQUIS) 5 MG TABS tablet Take 1 tablet (5 mg total) by mouth 2 (two) times daily. 180 tablet 0  . citalopram (CELEXA) 20 MG tablet Take 1 tablet (20 mg total) by mouth daily. 90 tablet 4  . estradiol (ESTRACE) 0.5 MG tablet Take 1 tablet (0.5 mg total) by mouth daily. 90 tablet 4  . Multiple Vitamin (MULTIVITAMIN WITH MINERALS)  TABS Take 1 tablet by mouth daily.    . nitrofurantoin, macrocrystal-monohydrate, (MACROBID) 100 MG capsule Take 1 po qd post coital 90 capsule 1   No facility-administered medications prior to visit.     Allergies:   Review of patient's allergies indicates no known allergies.   Social History   Social History  . Marital Status: Married    Spouse Name: N/A  . Number of Children: N/A  . Years of Education: N/A   Social History Main Topics  . Smoking status: Never Smoker   . Smokeless tobacco: Never Used  . Alcohol Use: 2.4 - 3.0 oz/week    4-5 Standard drinks or equivalent per week  . Drug Use: No  . Sexual Activity:    Partners: Male    Birth Control/ Protection: Surgical   Other Topics Concern  . None   Social History Narrative     Family History:  The patient's family history includes CVA in her father; Cancer in her paternal grandfather; Diabetes in her mother; Hypertension in her mother; Lung cancer in her mother; Skin cancer in her father.   ROS:   Please see the history of present illness.    ROS All other systems reviewed and are negative.   Physical Exam:  VS:  BP 116/70 mmHg  Pulse 60  Ht 5\' 3"  (1.6 m)  Wt 164 lb (74.39 kg)  BMI 29.06 kg/m2  LMP 03/19/1984   GEN: Well nourished, well developed, in no acute distress HEENT: normal Neck: no JVD, no masses Cardiac: Normal S1/S2,  RRR; no murmurs, rubs, or gallops, 1+ RLE edema;     Respiratory:  clear to auscultation bilaterally; no wheezing, rhonchi or rales GI: soft, nontender, nondistended MS: no deformity or atrophy Skin: warm and dry Neuro: No focal deficits  Psych: Alert and oriented x 3, normal affect  Wt Readings from Last 3 Encounters:  07/01/15 164 lb (74.39 kg)  06/03/15 162 lb (73.483 kg)  04/12/14 184 lb (83.462 kg)      Studies/Labs Reviewed:     EKG:  EKG is   ordered today.  The ekg ordered today demonstrates Sinus brady, HR 58, LAD, 1st degree AVB, PRWP, QTc 447 ms, PR 230  ms  Recent Labs: 06/03/2015: ALT 11; BUN 13; Creat 0.84; Potassium 4.4; Sodium 138; TSH 2.23   Recent Lipid Panel    Component Value Date/Time   CHOL 213* 06/03/2015 1403   TRIG 107 06/03/2015 1403   HDL 44* 06/03/2015 1403   CHOLHDL 4.8 06/03/2015 1403   VLDL 21 06/03/2015 1403   LDLCALC 148* 06/03/2015 1403    Additional studies/ records that were reviewed today include:   Echo 12/15 EF 50-55%, normal wall motion, mild MR    ASSESSMENT:     1. PAF (paroxysmal atrial fibrillation) (HCC)   2. Palpitations   3. Essential hypertension   4. On continuous oral anticoagulation   5. Leg edema, left     PLAN:     In order of problems listed above:  1. PAF - Maintaining NSR.  CHADS2-VASc=2.  She continues on Eliquis.  She is off Diltiazem due to lower BP after weight loss.    -  Continue Eliquis 5 mg bid  -  BMET, CBC today  -  Diltiazem 30 mg QD prn palpitations  2. Palpitations - Not like her PAF.  Will give prn Diltiazem.  If continues or worsens, she will call.  Would arrange Holter x 24 hrs at that point.    3. HTN - BP normal now off meds after weight loss.    4. Anticoagulation - Continue Eliquis. Check CBC, BMET today.  5. Edema - Lymphedema related to LN excision from Cervical CA.      Medication Adjustments/Labs and Tests Ordered: Current medicines are reviewed at length with the patient today.  Concerns regarding medicines are outlined above.  Medication changes, Labs and Tests ordered today are outlined in the Patient Instructions noted below. Patient Instructions  Medication Instructions:  1. START DILTIAZEM 30 MG DAILY AS NEEDED FOR PALPITATIONS Labwork: 1. TODAY BMET, CBC W/DIFF, MAGNESIUM LEVEL Testing/Procedures: NONE Follow-Up: Your physician wants you to follow-up in: DR. Marlou Porch 1 YEAR  You will receive a reminder letter in the mail two months in advance. If you don't receive a letter, please call our office to schedule the follow-up  appointment. Any Other Special Instructions Will Be Listed Below (If Applicable). If you need a refill on your cardiac medications before your next appointment, please call your pharmacy.   Signed, Richardson Dopp, PA-C  07/01/2015 12:41 PM    [facility] [place], [place], [place]  [address] Phone: [phone]; Fax: [phone]

## 2015-07-01 ENCOUNTER — Encounter: Payer: Self-pay | Admitting: Physician Assistant

## 2015-07-01 ENCOUNTER — Other Ambulatory Visit: Payer: Self-pay | Admitting: Physician Assistant

## 2015-07-01 ENCOUNTER — Ambulatory Visit (INDEPENDENT_AMBULATORY_CARE_PROVIDER_SITE_OTHER): Payer: BLUE CROSS/BLUE SHIELD | Admitting: Physician Assistant

## 2015-07-01 VITALS — BP 116/70 | HR 60 | Ht 63.0 in | Wt 164.0 lb

## 2015-07-01 DIAGNOSIS — Z7901 Long term (current) use of anticoagulants: Secondary | ICD-10-CM

## 2015-07-01 DIAGNOSIS — I480 Paroxysmal atrial fibrillation: Secondary | ICD-10-CM | POA: Diagnosis not present

## 2015-07-01 DIAGNOSIS — R002 Palpitations: Secondary | ICD-10-CM | POA: Diagnosis not present

## 2015-07-01 DIAGNOSIS — R600 Localized edema: Secondary | ICD-10-CM

## 2015-07-01 DIAGNOSIS — I10 Essential (primary) hypertension: Secondary | ICD-10-CM | POA: Diagnosis not present

## 2015-07-01 LAB — CBC WITH DIFFERENTIAL/PLATELET
BASOS PCT: 0 %
Basophils Absolute: 0 cells/uL (ref 0–200)
EOS ABS: 101 {cells}/uL (ref 15–500)
Eosinophils Relative: 1 %
HCT: 38.6 % (ref 35.0–45.0)
Hemoglobin: 12.7 g/dL (ref 11.7–15.5)
LYMPHS PCT: 28 %
Lymphs Abs: 2828 cells/uL (ref 850–3900)
MCH: 31.1 pg (ref 27.0–33.0)
MCHC: 32.9 g/dL (ref 32.0–36.0)
MCV: 94.6 fL (ref 80.0–100.0)
MONOS PCT: 10 %
MPV: 10.8 fL (ref 7.5–12.5)
Monocytes Absolute: 1010 cells/uL — ABNORMAL HIGH (ref 200–950)
NEUTROS PCT: 61 %
Neutro Abs: 6161 cells/uL (ref 1500–7800)
PLATELETS: 385 10*3/uL (ref 140–400)
RBC: 4.08 MIL/uL (ref 3.80–5.10)
RDW: 14.2 % (ref 11.0–15.0)
WBC: 10.1 10*3/uL (ref 3.8–10.8)

## 2015-07-01 LAB — BASIC METABOLIC PANEL
BUN: 13 mg/dL (ref 7–25)
CHLORIDE: 102 mmol/L (ref 98–110)
CO2: 29 mmol/L (ref 20–31)
CREATININE: 0.87 mg/dL (ref 0.50–0.99)
Calcium: 9.1 mg/dL (ref 8.6–10.4)
Glucose, Bld: 82 mg/dL (ref 65–99)
Potassium: 4.2 mmol/L (ref 3.5–5.3)
Sodium: 141 mmol/L (ref 135–146)

## 2015-07-01 LAB — MAGNESIUM: MAGNESIUM: 1.9 mg/dL (ref 1.5–2.5)

## 2015-07-01 MED ORDER — DILTIAZEM HCL 30 MG PO TABS: 30.0000 mg | ORAL_TABLET | Freq: Every day | ORAL | Status: DC | PRN

## 2015-07-01 NOTE — Patient Instructions (Addendum)
Medication Instructions:  1. START DILTIAZEM 30 MG DAILY AS NEEDED FOR PALPITATIONS Labwork: 1. TODAY BMET, CBC W/DIFF, MAGNESIUM LEVEL Testing/Procedures: NONE Follow-Up: Your physician wants you to follow-up in: DR. Marlou Porch 1 YEAR  You will receive a reminder letter in the mail two months in advance. If you don't receive a letter, please call our office to schedule the follow-up appointment. Any Other Special Instructions Will Be Listed Below (If Applicable). If you need a refill on your cardiac medications before your next appointment, please call your pharmacy.

## 2015-07-04 ENCOUNTER — Telehealth: Payer: Self-pay | Admitting: *Deleted

## 2015-07-04 NOTE — Telephone Encounter (Signed)
Pt has been notified of lab results by phone with verbal understanding. 

## 2015-10-14 ENCOUNTER — Other Ambulatory Visit: Payer: Self-pay | Admitting: Cardiology

## 2015-10-14 MED ORDER — APIXABAN 5 MG PO TABS: 5.0000 mg | ORAL_TABLET | Freq: Two times a day (BID) | ORAL | 2 refills | Status: DC

## 2015-10-20 ENCOUNTER — Encounter: Payer: Self-pay | Admitting: Obstetrics & Gynecology

## 2015-10-31 ENCOUNTER — Other Ambulatory Visit: Payer: Self-pay | Admitting: Obstetrics & Gynecology

## 2015-10-31 MED ORDER — ALPRAZOLAM 0.5 MG PO TABS: 0.5000 mg | ORAL_TABLET | Freq: Every day | ORAL | 1 refills | Status: DC | PRN

## 2015-10-31 MED ORDER — ESTRADIOL 0.5 MG PO TABS
0.5000 mg | ORAL_TABLET | Freq: Every day | ORAL | 2 refills | Status: DC
Start: 2015-10-31 — End: 2016-09-05

## 2015-10-31 NOTE — Telephone Encounter (Signed)
Prescription for Xanax 0.5mg  faxed to [facility] per patient preference. Fax # [phone].

## 2015-10-31 NOTE — Telephone Encounter (Signed)
Medication refill request: Estradiol/ Xanax  Last AEX:  06-03-15 Next AEX: 09-20-16 Last MMG (if hormonal medication request): 09-26-15 WNL Refill authorized: please advise

## 2015-10-31 NOTE — Telephone Encounter (Signed)
Patient calling for refills on estradol [phone]) and generic [phone].

## 2015-11-17 ENCOUNTER — Other Ambulatory Visit: Payer: BLUE CROSS/BLUE SHIELD

## 2015-11-17 ENCOUNTER — Other Ambulatory Visit: Payer: Self-pay | Admitting: Otolaryngology

## 2015-11-17 DIAGNOSIS — R52 Pain, unspecified: Secondary | ICD-10-CM

## 2015-11-18 ENCOUNTER — Ambulatory Visit
Admission: RE | Admit: 2015-11-18 | Discharge: 2015-11-18 | Disposition: A | Discharge status: Home / Self Care | Payer: BLUE CROSS/BLUE SHIELD | Source: Ambulatory Visit | Attending: Otolaryngology | Admitting: Otolaryngology

## 2015-11-18 DIAGNOSIS — R52 Pain, unspecified: Secondary | ICD-10-CM

## 2015-11-28 ENCOUNTER — Telehealth: Payer: Self-pay | Admitting: *Deleted

## 2015-11-28 NOTE — Telephone Encounter (Signed)
I'm comfortable with her holding Eliquis for 2 days prior to surgery and to 2-3 days post surgery-sinus surgery. She may proceed with low overall cardiac risk. Keep the look out for atrial fibrillation.  Candee Furbish, MD

## 2015-11-28 NOTE — Telephone Encounter (Signed)
Dr Radene Journey called requesting if OK to hold pt's Eliquis 2 days before and 2 to 3 days after surgery.  She is scheduled for 12/08/2015 for sinus surgery.  Also asking if she needs cardiac clearance.

## 2015-11-28 NOTE — Telephone Encounter (Signed)
Faxed to Dr Pollie Friar office and routed via [facility].

## 2015-11-28 NOTE — Telephone Encounter (Signed)
Printed and will fax to Dr Lucia Gaskins

## 2015-12-19 ENCOUNTER — Ambulatory Visit (INDEPENDENT_AMBULATORY_CARE_PROVIDER_SITE_OTHER): Payer: Medicare Other | Admitting: Physician Assistant

## 2015-12-19 ENCOUNTER — Telehealth: Payer: Self-pay | Admitting: Cardiology

## 2015-12-19 ENCOUNTER — Encounter (INDEPENDENT_AMBULATORY_CARE_PROVIDER_SITE_OTHER): Payer: Self-pay

## 2015-12-19 ENCOUNTER — Encounter: Payer: Self-pay | Admitting: Physician Assistant

## 2015-12-19 VITALS — BP 120/60 | HR 74 | Ht 63.0 in | Wt 168.8 lb

## 2015-12-19 DIAGNOSIS — I493 Ventricular premature depolarization: Secondary | ICD-10-CM | POA: Diagnosis not present

## 2015-12-19 DIAGNOSIS — I480 Paroxysmal atrial fibrillation: Secondary | ICD-10-CM | POA: Diagnosis not present

## 2015-12-19 DIAGNOSIS — I10 Essential (primary) hypertension: Secondary | ICD-10-CM | POA: Diagnosis not present

## 2015-12-19 LAB — BASIC METABOLIC PANEL
BUN: 22 mg/dL (ref 7–25)
CALCIUM: 8.9 mg/dL (ref 8.6–10.4)
CHLORIDE: 103 mmol/L (ref 98–110)
CO2: 28 mmol/L (ref 20–31)
CREATININE: 0.96 mg/dL (ref 0.50–0.99)
GLUCOSE: 76 mg/dL (ref 65–99)
Potassium: 4 mmol/L (ref 3.5–5.3)
Sodium: 142 mmol/L (ref 135–146)

## 2015-12-19 LAB — MAGNESIUM: Magnesium: 1.7 mg/dL (ref 1.5–2.5)

## 2015-12-19 MED ORDER — METOPROLOL TARTRATE 25 MG PO TABS: 12.5000 mg | ORAL_TABLET | Freq: Two times a day (BID) | ORAL | 3 refills | Status: DC

## 2015-12-19 NOTE — Telephone Encounter (Signed)
I spoke with the pt and she is scheduled for sinus surgery on 12/22/15 and is due to stop Eliquis 12/20/15.  The pt is concerned that she is in AFib and that this may post pone surgery and whether she should be off Eliquis with possible AFib.  The pt will keep appointment in the morning for further advisement. The pt is taking PRN Diltiazem for palpitations.     I did leave the pt a voicemail to contact the office if she would like to arrange an earlier appointment this afternoon.

## 2015-12-19 NOTE — Patient Instructions (Addendum)
Medication Instructions:  1. STOP DILTIAZEM 120 MG AND THE DILTIAZEM 30 MG TABLET  2. START METOPROLOL TARTRATE 25 MG TABLET WITH THE DIRECTIONS TO READ ON BOTTLE TAKE 1/2 TABLET TWICE DAILY = 12.5 MG TWICE DAILY: YOU CAN TAKE AN EXTRA 1/2 TABLET (12.5 MG) AS NEEDED FOR BREAK THROUGH PALPITATIONS  Labwork: TODAY BMET, MAGNESIUM LEVEL  Testing/Procedures: NONE  Follow-Up: 06/2016 WITH DR. Marlou Porch  Any Other Special Instructions Will Be Listed Below (If Applicable). CALL IF BLOOD PRESSURE READINGS ARE HIGH > 140/90   CALL IF PALPITATIONS CONTINUE  If you need a refill on your cardiac medications before your next appointment, please call your pharmacy.

## 2015-12-19 NOTE — Telephone Encounter (Signed)
New message  FYI  I scheduled the pt for tomorrow w/ Kathlen Mody at 8:15.    Patient c/o Palpitations:  High priority if patient c/o lightheadedness and shortness of breath.  1. How long have you been having palpitations? A week   2. Are you currently experiencing lightheadedness and shortness of breath? no  3. Have you checked your BP and heart rate? (document readings) 135/86 this morning  4. Are you experiencing any other symptoms? Overly tired

## 2015-12-19 NOTE — Progress Notes (Signed)
Cardiology Office Note:    Date:  12/19/2015   ID:  Savannah Hill, DOB [birth year], MRN [id number]  PCP:  Savannah Casco, MD  Cardiologist:  Dr. Candee Hill   Electrophysiologist:  n/a  Referring MD: Savannah Pillar, MD   Chief Complaint  Patient presents with  . Palpitations    History of Present Illness:    Savannah Hill is a 65 y.o. female with a hx of PAF, lymphedema.  She converted to NSR on diltiazem gtt in the hospital in 2013.  Nuc stress test was neg for ischemia.  EF was normal.  She had another bout of PAF in 1/16 and converted on diltiazem again.  CHADS2-VASc=2 (female, HTN).  She is on Eliquis for anticoagulation.  Last seen here in 4/17 by me.  She is to undergo sinus surgery with Dr. Lucia Hill soon and Dr. Marlou Hill previously cleared her for holding Eliquis for her surgery.  She called in today with concerns that she is back in AFib.  She is added on for further evaluation.    She is under a great deal of stress.  Her parents are living close by now. She is the [employer] and has a lot of stress with this job.  She is not sleeping well. She has recently noted palpitations that make her feel lightheaded at times.  She did start back on Cardizem CD 120 QD due to elevated BPs.  She is also taking prn Diltiazem to help with her palpitations.  She was concerned she was back in AFib as she needs to hold Eliquis for her surgery later this week.  She denies chest pain, shortness of breath, orthopnea, PND.  She has lymphedema in her R leg.  She denies syncope.    Prior CV studies that were reviewed today include:    Echo 12/15 EF 50-55%, normal wall motion, mild MR   Past Medical History:  Diagnosis Date  . Anemia    borderline  . Atrial fibrillation ([place])   . Cancer [facility])    cervical/rad hysterectomy/bso wiht chemo for small cell ca  . Hypertension     Past Surgical History:  Procedure Laterality Date  . RADICAL ABDOMINAL HYSTERECTOMY  1986   with BSO    Current Medications: Current Meds  Medication Sig  . ALPRAZolam (XANAX) 0.5 MG tablet Take 1 tablet (0.5 mg total) by mouth daily as needed. ANXIETY  . apixaban (ELIQUIS) 5 MG TABS tablet Take 1 tablet (5 mg total) by mouth 2 (two) times daily.  . citalopram (CELEXA) 20 MG tablet Take 1 tablet (20 mg total) by mouth daily.  [name] Kitchen estradiol (ESTRACE) 0.5 MG tablet Take 1 tablet (0.5 mg total) by mouth daily.  . Furosemide (LASIX PO) Take 1 tablet by mouth daily. TAKE 1 TABLET BY MOUTH ONCE DAILY. PATIENT NOT SURE OF DOSEAGE  . Multiple Vitamin (MULTIVITAMIN WITH MINERALS) TABS Take 1 tablet by mouth daily.  . nitrofurantoin, macrocrystal-monohydrate, (MACROBID) 100 MG capsule Take 100 mg by mouth 2 (two) times daily as needed (FOR UTI (URINARY TRACT INFECTION)).   . [DISCONTINUED] diltiazem (CARDIZEM CD) 120 MG 24 hr capsule Take 120 mg by mouth daily.  . [DISCONTINUED] diltiazem (CARDIZEM) 30 MG tablet Take 1 tablet (30 mg total) by mouth daily as needed (for palpitations).     Allergies:   Review of patient's allergies indicates no known allergies.   Social History   Social History  . Marital status: Married    Spouse name:  N/A  . Number of children: N/A  . Years of education: N/A   Social History Main Topics  . Smoking status: Never Smoker  . Smokeless tobacco: Never Used  . Alcohol use 2.4 - 3.0 oz/week    4 - 5 Standard drinks or equivalent per week  . Drug use: No  . Sexual activity: Yes    Partners: Male    Birth control/ protection: Surgical   Other Topics Concern  . 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   ROS:   Please see the history of present illness.      Review of Systems  Cardiovascular: Positive for irregular heartbeat.   All other systems reviewed and are negative.   EKGs/Labs/Other Test Reviewed:    EKG:  EKG is  ordered today.  The ekg ordered today demonstrates NSR, HR 74, LAD, PVCs, HR 74, QTc 441 ms. No changes.   Recent Labs: 06/03/2015: ALT 11; TSH 2.23 07/01/2015: BUN 13; Creat 0.87; Hemoglobin 12.7; Magnesium 1.9; Platelets 385; Potassium 4.2; Sodium 141   Recent Lipid Panel    Component Value Date/Time   CHOL 213 (H) 06/03/2015 1403   TRIG 107 06/03/2015 1403   HDL 44 (L) 06/03/2015 1403   CHOLHDL 4.8 06/03/2015 1403   VLDL 21 06/03/2015 1403   LDLCALC 148 (H) 06/03/2015 1403     Physical Exam:    VS:  BP 120/60   Pulse 74   Ht 5\' 3"  (1.6 m)   Wt 168 lb 12.8 oz (76.6 kg)   LMP 03/19/1984   BMI 29.90 kg/m     Wt Readings from Last 3 Encounters:  12/19/15 168 lb 12.8 oz (76.6 kg)  07/01/15 164 lb (74.4 kg)  06/03/15 162 lb (73.5 kg)     Physical Exam  Constitutional: She is oriented to person, place, and time. She appears well-developed and well-nourished. No distress.  HENT:  Head: Normocephalic and atraumatic.  Eyes: No scleral icterus.  Neck: Normal range of motion. No JVD present.  Cardiovascular: Normal rate, regular rhythm, S1 normal, S2 normal and normal heart sounds.   No murmur heard. Pulmonary/Chest: Breath sounds normal. She has no wheezes. She has no rhonchi. She has no rales.  Abdominal: Soft. There is no tenderness.  Musculoskeletal: She exhibits no edema.  Neurological: She is alert and oriented to person, place, and time.  Skin: Skin is warm and dry.  Psychiatric: She has a normal mood and affect.    ASSESSMENT:    1. PVC's (premature ventricular contractions)   2. PAF (paroxysmal atrial fibrillation) ([place])   3. Essential hypertension    PLAN:    In order of problems listed above:  1. PVCs - She presents with symptomatic PVCs. These are likely related to stress/anxiety.   She denies caffeine use or stimulant use. She is currently taking a calcium channel blocker without benefit.  I have asked her to FU with her PCP to discuss alternatives to Xanax for her anxiety.  As she is having symptomatic PVCs on Diltiazem, I will DC Diltiazem.  Start Metoprolol Tartrate 12.5 mg bid.  She can take extra Metoprolol Tartrate 12.5 mg prn palpitations.  Check BMET, Mg2+ today.  If symptoms continue, consider Echo and Holter monitor.  2. PAF - Maintaining NSR. She is on Eliquis for anticoagulation.  She may hold this 2 days prior to sinus surgery and she can resume post op when felt to be safe by the surgeon.  3. HTN - BP controlled. She will call if her BP runs higher on the Metoprolol.     Medication Adjustments/Labs and Tests Ordered: Current medicines are reviewed at length with the patient today.  Concerns regarding medicines are outlined above.  Medication changes, Labs and Tests ordered today are outlined in the Patient Instructions noted below. Patient Instructions  Medication Instructions:  1. STOP DILTIAZEM 120 MG AND THE DILTIAZEM 30 MG TABLET  2. START METOPROLOL TARTRATE 25 MG TABLET WITH THE DIRECTIONS TO READ ON BOTTLE TAKE 1/2 TABLET TWICE DAILY = 12.5 MG TWICE DAILY: YOU CAN TAKE AN EXTRA 1/2 TABLET (12.5 MG) AS NEEDED FOR BREAK THROUGH PALPITATIONS  Labwork: TODAY BMET, MAGNESIUM LEVEL  Testing/Procedures: NONE  Follow-Up: 06/2016 WITH DR. Marlou Hill  Any Other Special Instructions Will Be Listed Below (If Applicable). CALL IF BLOOD PRESSURE READINGS ARE HIGH > 140/90   CALL IF PALPITATIONS CONTINUE  If you need a refill on your cardiac medications before your next appointment, please call your pharmacy.   Signed, Richardson Dopp, PA-C  12/19/2015 3:21 PM    [facility] [place], [place], [place]  [address] Phone: [phone]; Fax: [phone]

## 2015-12-20 ENCOUNTER — Ambulatory Visit: Payer: BLUE CROSS/BLUE SHIELD | Admitting: Physician Assistant

## 2015-12-20 ENCOUNTER — Telehealth: Payer: Self-pay | Admitting: *Deleted

## 2015-12-20 NOTE — Telephone Encounter (Signed)
Lmom lab work looks good . Continue on current Tx plan.

## 2015-12-22 ENCOUNTER — Other Ambulatory Visit: Payer: Self-pay | Admitting: Otolaryngology

## 2015-12-22 DIAGNOSIS — J320 Chronic maxillary sinusitis: Secondary | ICD-10-CM | POA: Diagnosis not present

## 2015-12-22 DIAGNOSIS — J322 Chronic ethmoidal sinusitis: Secondary | ICD-10-CM | POA: Diagnosis not present

## 2015-12-22 DIAGNOSIS — J343 Hypertrophy of nasal turbinates: Secondary | ICD-10-CM | POA: Diagnosis not present

## 2015-12-22 DIAGNOSIS — J338 Other polyp of sinus: Secondary | ICD-10-CM | POA: Diagnosis not present

## 2015-12-22 DIAGNOSIS — J329 Chronic sinusitis, unspecified: Secondary | ICD-10-CM | POA: Diagnosis not present

## 2015-12-23 ENCOUNTER — Telehealth: Payer: Self-pay | Admitting: *Deleted

## 2015-12-23 NOTE — Telephone Encounter (Signed)
Pt notified of lab results by phone with verbal understanding.  

## 2016-01-28 ENCOUNTER — Other Ambulatory Visit: Payer: Self-pay | Admitting: Cardiology

## 2016-01-28 DIAGNOSIS — I480 Paroxysmal atrial fibrillation: Secondary | ICD-10-CM

## 2016-02-13 DIAGNOSIS — J069 Acute upper respiratory infection, unspecified: Secondary | ICD-10-CM | POA: Diagnosis not present

## 2016-03-02 ENCOUNTER — Other Ambulatory Visit: Payer: Self-pay | Admitting: Cardiology

## 2016-03-02 DIAGNOSIS — I480 Paroxysmal atrial fibrillation: Secondary | ICD-10-CM

## 2016-03-08 DIAGNOSIS — I890 Lymphedema, not elsewhere classified: Secondary | ICD-10-CM | POA: Diagnosis not present

## 2016-04-02 DIAGNOSIS — J101 Influenza due to other identified influenza virus with other respiratory manifestations: Secondary | ICD-10-CM | POA: Diagnosis not present

## 2016-04-02 DIAGNOSIS — J069 Acute upper respiratory infection, unspecified: Secondary | ICD-10-CM | POA: Diagnosis not present

## 2016-04-10 ENCOUNTER — Other Ambulatory Visit: Payer: Self-pay

## 2016-04-10 NOTE — Telephone Encounter (Signed)
Medication refill request: Citalopram Last AEX:  06/03/15 SM Next AEX: 09/20/16 Last MMG (if hormonal medication request): 09/26/15 BIRADS 1 negative Refill authorized: 06/03/15 #90 w/4 refills; today please advise; [facility] needs a new prescription on file

## 2016-04-12 MED ORDER — CITALOPRAM HYDROBROMIDE 20 MG PO TABS: 20.0000 mg | ORAL_TABLET | Freq: Every day | ORAL | 1 refills | Status: DC

## 2016-04-27 ENCOUNTER — Encounter: Payer: Self-pay | Admitting: Physician Assistant

## 2016-04-29 NOTE — Progress Notes (Signed)
Cardiology Office Note    Date:  04/30/2016   ID:  Savannah Hill, DOB 12-01-1950, MRN [id number]  PCP:  Osborne Casco, MD  Cardiologist:  Dr. Candee Furbish    CC: heart out of rhythm   History of Present Illness:  Savannah Hill is a 66 y.o. female with a history of PAF on Eliquis, HTN, PVCs and lymphedema who presents to clinic for evaluation of her "heart being out of rhythm."   She has a history of PAF. She converted to NSR on diltiazem gtt in the hospital in 2013.Nuc stress test was neg for ischemia. EF was normal. She had another bout of PAF in 1/16 and converted on diltiazem again. CHADS2-VASc=2 (female, HTN). She is on Eliquis for anticoagulation.    She was last seen Richardson Dopp PA-C in 12/2015 for palpitations. She reported being under a lot of stress at that time. She was found to have symptomatic PVCs. Follow up lab work (BMET, [place]) was normal. Diltiazem was discontinued and she was started on Lopressor 12.5mg  BID and an extra PRN for palpitations.   Today she presents for "heart being out of rhythm." For X mas she got the [employer] Iphone APP. SHe brought in tracings from this which showed afib on Jan 13 with HR 108,  afib on Jan 14 with  HR 94, and afib on Feb 4 afib with HR 81. She says afib has been constant for past month or so. She has significant dyspnea on exertion and fatigue. Just feels wiped out. Thinks related to heart rhythm. ECG today with aflutter with RVR HR 139. She has been complaint with Eliquis and not missed any doses. No chest pain. No LE edema, orthopnea or PND. No dizziness or syncope. No blood in stool or urine. She has had a lot of stress recently with her father being placed on hospice. She is supposed to go to [place] for 5 days next week.     Past Medical History:  Diagnosis Date  . Anemia    borderline  . Atrial fibrillation ([place])   . Cancer [facility])    cervical/rad hysterectomy/bso wiht chemo for small cell ca  . Hypertension      Past Surgical History:  Procedure Laterality Date  . RADICAL ABDOMINAL HYSTERECTOMY  1986   with BSO    Current Medications: Outpatient Medications Prior to Visit  Medication Sig Dispense Refill  . ALPRAZolam (XANAX) 0.5 MG tablet Take 1 tablet (0.5 mg total) by mouth daily as needed. ANXIETY 30 tablet 1  . citalopram (CELEXA) 20 MG tablet Take 1 tablet (20 mg total) by mouth daily. 90 tablet 1  . ELIQUIS 5 MG TABS tablet TAKE 1 TABLET BY MOUTH TWICE DAILY 60 tablet 9  . estradiol (ESTRACE) 0.5 MG tablet Take 1 tablet (0.5 mg total) by mouth daily. 90 tablet 2  . Furosemide (LASIX PO) Take 1 tablet by mouth daily. TAKE 1 TABLET BY MOUTH ONCE DAILY. PATIENT NOT SURE OF DOSEAGE    . Multiple Vitamin (MULTIVITAMIN WITH MINERALS) TABS Take 1 tablet by mouth daily.    . nitrofurantoin, macrocrystal-monohydrate, (MACROBID) 100 MG capsule Take 100 mg by mouth 2 (two) times daily as needed (FOR UTI (URINARY TRACT INFECTION)).     . metoprolol (LOPRESSOR) 25 MG tablet Take 0.5 tablets (12.5 mg total) by mouth 2 (two) times daily. 180 tablet 3   No facility-administered medications prior to visit.      Allergies:   Patient has no known  allergies.   Social History   Social History  . Marital status: Married    Spouse name: N/A  . Number of children: N/A  . Years of education: N/A   Social History Main Topics  . Smoking status: Never Smoker  . Smokeless tobacco: Never Used  . Alcohol use 2.4 - 3.0 oz/week    4 - 5 Standard drinks or equivalent per week  . Drug use: No  . Sexual activity: Yes    Partners: Male    Birth control/ protection: Surgical   Other Topics Concern  . 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       ROS:   Please see the history of present illness.    ROS All other systems reviewed and are negative.   PHYSICAL EXAM:   VS:  BP 120/62   Pulse (!) 139   Ht 5\' 3"  (1.6 m)   Wt 176 lb 12.8 oz (80.2 kg)   LMP 03/19/1984   BMI 31.32 kg/m    GEN: Well nourished, well developed, in no acute distress  HEENT: normal  Neck: no JVD, carotid bruits, or masses Cardiac: irreg irreg, tachy; no murmurs, rubs, or gallops,no edema  Respiratory:  clear to auscultation bilaterally, normal work of breathing GI: soft, nontender, nondistended, + BS MS: no deformity or atrophy  Skin: warm and dry, no rash Neuro:  Alert and Oriented x 3, Strength and sensation are intact Psych: euthymic mood, full affect    Wt Readings from Last 3 Encounters:  04/30/16 176 lb 12.8 oz (80.2 kg)  12/19/15 168 lb 12.8 oz (76.6 kg)  07/01/15 164 lb (74.4 kg)      Studies/Labs Reviewed:   EKG:  EKG is ordered today.  The ekg ordered today demonstrates aflutter with RVR HR 139  Recent Labs: 06/03/2015: ALT 11; TSH 2.23 07/01/2015: Hemoglobin 12.7; Platelets 385 12/19/2015: BUN 22; Creat 0.96; Magnesium 1.7; Potassium 4.0; Sodium 142   Lipid Panel    Component Value Date/Time   CHOL 213 (H) 06/03/2015 1403   TRIG 107 06/03/2015 1403   HDL 44 (L) 06/03/2015 1403   CHOLHDL 4.8 06/03/2015 1403   VLDL 21 06/03/2015 1403   LDLCALC 148 (H) 06/03/2015 1403    Additional studies/ records that were reviewed today include:  2D ECHO: 02/24/2014 LV EF: 50% -  55% Study Conclusions - Left ventricle: The cavity size was normal. Wall thickness was normal. Systolic function was normal. The estimated ejection fraction was in the range of 50% to 55%. Wall motion was normal; there were no regional wall motion abnormalities. - Mitral valve: There was mild regurgitation.  ASSESSMENT & PLAN:   Symptomatic atrial flutter with RVR: continue Eliquis for CHADSVASC of at least  3 (HTN, age, F sex). HR 139 today. Will increase Lopressor from 12.5 mg BID to 25mg  BID. She has not missed any doses of Eliquis. Will set her up for DCCV (thursday at [place] first available). Will order 2D ECHO to assess cardiac structure and function. Normal LV function 02/2014. Will refer her to EP to discuss further therapies (flecainide vs ablation). I think she will be okay to go  to [place] next week. She has her Iphone app at home to monitor heart rhythm. If she notices HR back in afib/flutter with elevated rates before vacation, she will notify us. Otherwise, plan to follow up with Dr. Curt Bears next available after her trip.  PVCs: continue Metoprolol, will increase dose as above.  HTN: Bp well controlled today.    Medication Adjustments/Labs and Tests Ordered: Current medicines are reviewed at length with the patient today.  Concerns regarding medicines are outlined above.  Medication changes, Labs and Tests ordered today are listed in the Patient Instructions below. Patient Instructions  Medication Instructions:  Your physician has recommended you make the following change in your medication:  1.  INCREASE the Lopressor 25 mg taking 1 tablet twice a day    Labwork: TODAY:  BMET, CBC, & PT/INR  Testing/Procedures: Your physician has recommended that you have a Cardioversion (DCCV). Electrical Cardioversion uses a jolt of electricity to your heart either through paddles or wired patches attached to your chest. This is a controlled, usually prescheduled, procedure. Defibrillation is done under light anesthesia in the hospital, and you usually go home the day of the procedure. This is done to get your heart back into a normal rhythm. You are not awake for the procedure. Please see the instruction sheet given to you today.   Your physician has requested that you have an echocardiogram. Echocardiography is a painless test that uses sound waves to create images of your heart. It provides your  doctor with information about the size and shape of your heart and how well your heart's chambers and valves are working. This procedure takes approximately one hour. There are no restrictions for this procedure.    Follow-Up: Your physician recommends that you schedule a follow-up appointment in: [facility] A-FIB   Any Other Special Instructions Will Be Listed Below (If Applicable).  Electrical Cardioversion Electrical cardioversion is the delivery of a jolt of electricity to restore a normal rhythm to the heart. A rhythm that is too fast or is not regular keeps the heart from pumping well. In this procedure, sticky patches or metal paddles are placed on the chest to deliver electricity to the heart from a device. This procedure may be done in an emergency if:  There is low or no blood pressure as a result of the heart rhythm.  Normal rhythm must be restored as fast as possible to protect the brain and heart from further damage.  It may save a life. This procedure may also be done for irregular or fast heart rhythms that are not immediately life-threatening. Tell a health care provider about:  Any allergies you have.  All medicines you are taking, including vitamins, herbs, eye drops, creams, and over-the-counter medicines.  Any problems you or family members have had with anesthetic medicines.  Any blood disorders you have.  Any surgeries you have had.  Any medical conditions you have.  Whether you are pregnant or may be pregnant. What are the risks? Generally, this is a safe procedure. However, problems may occur, including:  Allergic reactions to medicines.  A blood clot that breaks free and travels to other parts of your body.  The possible return of an abnormal heart rhythm within hours or days after the procedure.  Your heart stopping (cardiac arrest). This is rare. What happens before the procedure? Medicines  Your health care provider may  have you start taking:  Blood-thinning medicines (anticoagulants) so your blood does not  clot as easily.  Medicines may be given to help stabilize your heart rate and rhythm.  Ask your health care provider about changing or stopping your regular medicines. This is especially important if you are taking diabetes medicines or blood thinners. General instructions  Plan to have someone take you home from the hospital or clinic.  If you will be going home right after the procedure, plan to have someone with you for 24 hours.  Follow instructions from your health care provider about eating or drinking restrictions. What happens during the procedure?  To lower your risk of infection:  Your health care team will wash or sanitize their hands.  Your skin will be washed with soap.  An IV tube will be inserted into one of your veins.  You will be given a medicine to help you relax (sedative).  Sticky patches (electrodes) or metal paddles may be placed on your chest.  An electrical shock will be delivered. The procedure may vary among health care providers and hospitals. What happens after the procedure?  Your blood pressure, heart rate, breathing rate, and blood oxygen level will be monitored until the medicines you were given have worn off.  Do not drive for 24 hours if you were given a sedative.  Your heart rhythm will be watched to make sure it does not change. This information is not intended to replace advice given to you by your health care provider. Make sure you discuss any questions you have with your health care provider. Document Released: 02/23/2002 Document Revised: 11/02/2015 Document Reviewed: 09/09/2015 Elsevier Interactive Patient Education  2017 [employer].     If you need a refill on your cardiac medications before your next appointment, please call your pharmacy.  Echocardiogram An echocardiogram, or echocardiography, uses sound waves (ultrasound) to produce  an image of your heart. The echocardiogram is simple, painless, obtained within a short period of time, and offers valuable information to your health care provider. The images from an echocardiogram can provide information such as:  Evidence of coronary artery disease (CAD).  Heart size.  Heart muscle function.  Heart valve function.  Aneurysm detection.  Evidence of a past heart attack.  Fluid buildup around the heart.  Heart muscle thickening.  Assess heart valve function. Tell a health care provider about:  Any allergies you have.  All medicines you are taking, including vitamins, herbs, eye drops, creams, and over-the-counter medicines.  Any problems you or family members have had with anesthetic medicines.  Any blood disorders you have.  Any surgeries you have had.  Any medical conditions you have.  Whether you are pregnant or may be pregnant. What happens before the procedure? No special preparation is needed. Eat and drink normally. What happens during the procedure?  In order to produce an image of your heart, gel will be applied to your chest and a wand-like tool (transducer) will be moved over your chest. The gel will help transmit the sound waves from the transducer. The sound waves will harmlessly bounce off your heart to allow the heart images to be captured in real-time motion. These images will then be recorded.  You may need an IV to receive a medicine that improves the quality of the pictures. What happens after the procedure? You may return to your normal schedule including diet, activities, and medicines, unless your health care provider tells you otherwise. This information is not intended to replace advice given to you by your health care provider. Make sure you  discuss any questions you have with your health care provider. Document Released: 03/02/2000 Document Revised: 10/22/2015 Document Reviewed: 11/10/2012 Elsevier Interactive Patient Education   2017 [place], Angelena Form, [place]  04/30/2016 12:36 PM    [facility], [place], [place]  [address] Phone: [phone]; Fax: [phone]

## 2016-04-30 ENCOUNTER — Encounter: Payer: Self-pay | Admitting: *Deleted

## 2016-04-30 ENCOUNTER — Ambulatory Visit (INDEPENDENT_AMBULATORY_CARE_PROVIDER_SITE_OTHER): Payer: Medicare Other | Admitting: Physician Assistant

## 2016-04-30 ENCOUNTER — Encounter: Payer: Self-pay | Admitting: Physician Assistant

## 2016-04-30 ENCOUNTER — Encounter (INDEPENDENT_AMBULATORY_CARE_PROVIDER_SITE_OTHER): Payer: Self-pay

## 2016-04-30 VITALS — BP 120/62 | HR 139 | Ht 63.0 in | Wt 176.8 lb

## 2016-04-30 DIAGNOSIS — I493 Ventricular premature depolarization: Secondary | ICD-10-CM

## 2016-04-30 DIAGNOSIS — I10 Essential (primary) hypertension: Secondary | ICD-10-CM

## 2016-04-30 DIAGNOSIS — I4892 Unspecified atrial flutter: Secondary | ICD-10-CM

## 2016-04-30 DIAGNOSIS — R002 Palpitations: Secondary | ICD-10-CM | POA: Diagnosis not present

## 2016-04-30 DIAGNOSIS — I480 Paroxysmal atrial fibrillation: Secondary | ICD-10-CM | POA: Diagnosis not present

## 2016-04-30 MED ORDER — METOPROLOL TARTRATE 25 MG PO TABS: 25.0000 mg | ORAL_TABLET | Freq: Two times a day (BID) | ORAL | 1 refills | Status: DC

## 2016-04-30 NOTE — Patient Instructions (Addendum)
Medication Instructions:  Your physician has recommended you make the following change in your medication:  1.  INCREASE the Lopressor 25 mg taking 1 tablet twice a day    Labwork: TODAY:  BMET, CBC, & PT/INR  Testing/Procedures: Your physician has recommended that you have a Cardioversion (DCCV). Electrical Cardioversion uses a jolt of electricity to your heart either through paddles or wired patches attached to your chest. This is a controlled, usually prescheduled, procedure. Defibrillation is done under light anesthesia in the hospital, and you usually go home the day of the procedure. This is done to get your heart back into a normal rhythm. You are not awake for the procedure. Please see the instruction sheet given to you today.   Your physician has requested that you have an echocardiogram. Echocardiography is a painless test that uses sound waves to create images of your heart. It provides your doctor with information about the size and shape of your heart and how well your heart's chambers and valves are working. This procedure takes approximately one hour. There are no restrictions for this procedure.    Follow-Up: Your physician recommends that you schedule a follow-up appointment in: 2 [place] [facility]   Any Other Special Instructions Will Be Listed Below (If Applicable).  Electrical Cardioversion Electrical cardioversion is the delivery of a jolt of electricity to restore a normal rhythm to the heart. A rhythm that is too fast or is not regular keeps the heart from pumping well. In this procedure, sticky patches or metal paddles are placed on the chest to deliver electricity to the heart from a device. This procedure may be done in an emergency if:  There is low or no blood pressure as a result of the heart rhythm.  Normal rhythm must be restored as fast as possible to protect the brain and heart from further damage.  It may save a life. This procedure may  also be done for irregular or fast heart rhythms that are not immediately life-threatening. Tell a health care provider about:  Any allergies you have.  All medicines you are taking, including vitamins, herbs, eye drops, creams, and over-the-counter medicines.  Any problems you or family members have had with anesthetic medicines.  Any blood disorders you have.  Any surgeries you have had.  Any medical conditions you have.  Whether you are pregnant or may be pregnant. What are the risks? Generally, this is a safe procedure. However, problems may occur, including:  Allergic reactions to medicines.  A blood clot that breaks free and travels to other parts of your body.  The possible return of an abnormal heart rhythm within hours or days after the procedure.  Your heart stopping (cardiac arrest). This is rare. What happens before the procedure? Medicines  Your health care provider may have you start taking:  Blood-thinning medicines (anticoagulants) so your blood does not clot as easily.  Medicines may be given to help stabilize your heart rate and rhythm.  Ask your health care provider about changing or stopping your regular medicines. This is especially important if you are taking diabetes medicines or blood thinners. General instructions  Plan to have someone take you home from the hospital or clinic.  If you will be going home right after the procedure, plan to have someone with you for 24 hours.  Follow instructions from your health care provider about eating or drinking restrictions. What happens during the procedure?  To lower your risk of infection:  Your health care team will wash or sanitize their hands.  Your skin will be washed with soap.  An IV tube will be inserted into one of your veins.  You will be given a medicine to help you relax (sedative).  Sticky patches (electrodes) or metal paddles may be placed on your chest.  An electrical shock will  be delivered. The procedure may vary among health care providers and hospitals. What happens after the procedure?  Your blood pressure, heart rate, breathing rate, and blood oxygen level will be monitored until the medicines you were given have worn off.  Do not drive for 24 hours if you were given a sedative.  Your heart rhythm will be watched to make sure it does not change. This information is not intended to replace advice given to you by your health care provider. Make sure you discuss any questions you have with your health care provider. Document Released: 02/23/2002 Document Revised: 11/02/2015 Document Reviewed: 09/09/2015 Elsevier Interactive Patient Education  2017 [employer].     If you need a refill on your cardiac medications before your next appointment, please call your pharmacy.  Echocardiogram An echocardiogram, or echocardiography, uses sound waves (ultrasound) to produce an image of your heart. The echocardiogram is simple, painless, obtained within a short period of time, and offers valuable information to your health care provider. The images from an echocardiogram can provide information such as:  Evidence of coronary artery disease (CAD).  Heart size.  Heart muscle function.  Heart valve function.  Aneurysm detection.  Evidence of a past heart attack.  Fluid buildup around the heart.  Heart muscle thickening.  Assess heart valve function. Tell a health care provider about:  Any allergies you have.  All medicines you are taking, including vitamins, herbs, eye drops, creams, and over-the-counter medicines.  Any problems you or family members have had with anesthetic medicines.  Any blood disorders you have.  Any surgeries you have had.  Any medical conditions you have.  Whether you are pregnant or may be pregnant. What happens before the procedure? No special preparation is needed. Eat and drink normally. What happens during the  procedure?  In order to produce an image of your heart, gel will be applied to your chest and a wand-like tool (transducer) will be moved over your chest. The gel will help transmit the sound waves from the transducer. The sound waves will harmlessly bounce off your heart to allow the heart images to be captured in real-time motion. These images will then be recorded.  You may need an IV to receive a medicine that improves the quality of the pictures. What happens after the procedure? You may return to your normal schedule including diet, activities, and medicines, unless your health care provider tells you otherwise. This information is not intended to replace advice given to you by your health care provider. Make sure you discuss any questions you have with your health care provider. Document Released: 03/02/2000 Document Revised: 10/22/2015 Document Reviewed: 11/10/2012 Elsevier Interactive Patient Education  2017 [employer].

## 2016-05-01 LAB — PROTIME-INR
INR: 1 (ref 0.8–1.2)
Prothrombin Time: 10.4 s (ref 9.1–12.0)

## 2016-05-01 LAB — CBC
Hematocrit: 40.2 % (ref 34.0–46.6)
Hemoglobin: 13.1 g/dL (ref 11.1–15.9)
MCH: 31.5 pg (ref 26.6–33.0)
MCHC: 32.6 g/dL (ref 31.5–35.7)
MCV: 97 fL (ref 79–97)
PLATELETS: 357 10*3/uL (ref 150–379)
RBC: 4.16 x10E6/uL (ref 3.77–5.28)
RDW: 13.8 % (ref 12.3–15.4)
WBC: 6.3 10*3/uL (ref 3.4–10.8)

## 2016-05-01 LAB — BASIC METABOLIC PANEL
BUN/Creatinine Ratio: 13 (ref 12–28)
BUN: 13 mg/dL (ref 8–27)
CO2: 25 mmol/L (ref 18–29)
CREATININE: 1.02 mg/dL — AB (ref 0.57–1.00)
Calcium: 9.5 mg/dL (ref 8.7–10.3)
Chloride: 99 mmol/L (ref 96–106)
GFR calc Af Amer: 67 mL/min/{1.73_m2} (ref >59)
GFR calc non Af Amer: 58 mL/min/{1.73_m2} — ABNORMAL LOW (ref >59)
GLUCOSE: 88 mg/dL (ref 65–99)
POTASSIUM: 4.7 mmol/L (ref 3.5–5.2)
SODIUM: 141 mmol/L (ref 134–144)

## 2016-05-01 MED ORDER — METOPROLOL TARTRATE 25 MG PO TABS: 25.0000 mg | ORAL_TABLET | Freq: Two times a day (BID) | ORAL | 1 refills | Status: DC

## 2016-05-01 NOTE — Addendum Note (Signed)
Addended by: Gaetano Net on: 05/01/2016 12:23 PM   Modules accepted: Orders

## 2016-05-03 ENCOUNTER — Ambulatory Visit (HOSPITAL_COMMUNITY)
Admission: RE | Admit: 2016-05-03 | Discharge: 2016-05-03 | Disposition: A | Discharge status: Home / Self Care | Payer: Medicare Other | Source: Ambulatory Visit | Attending: Cardiovascular Disease | Admitting: Cardiovascular Disease

## 2016-05-03 ENCOUNTER — Ambulatory Visit (HOSPITAL_COMMUNITY): Payer: Medicare Other | Admitting: Certified Registered Nurse Anesthetist

## 2016-05-03 ENCOUNTER — Encounter (HOSPITAL_COMMUNITY): Payer: Self-pay | Admitting: Certified Registered Nurse Anesthetist

## 2016-05-03 ENCOUNTER — Encounter (HOSPITAL_COMMUNITY)
Admission: RE | Disposition: A | Discharge status: Home / Self Care | Payer: Self-pay | Source: Ambulatory Visit | Attending: Cardiovascular Disease

## 2016-05-03 DIAGNOSIS — Z539 Procedure and treatment not carried out, unspecified reason: Secondary | ICD-10-CM | POA: Insufficient documentation

## 2016-05-03 SURGERY — CANCELLED PROCEDURE

## 2016-05-03 NOTE — Progress Notes (Signed)
Patient arrived to endoscopy unit for outpatient cardioversion today. Sinus rhythm on monitor. 12 lead EKG obtained, confirms same. Dr. Acie Fredrickson notified, he came to unit to discuss with patient. Order received to cancel cardioversion today due to patient self converted. Patient advised of this, expresses understanding and agrees.

## 2016-05-03 NOTE — Anesthesia Preprocedure Evaluation (Signed)
Anesthesia Evaluation    History of Anesthesia Complications Negative for: history of anesthetic complications  Airway        Dental   Pulmonary neg pulmonary ROS,           Cardiovascular hypertension, Pt. on medications and Pt. on home beta blockers + dysrhythmias Atrial Fibrillation   '15 ECHO: EF 50-55%, valves OK   Neuro/Psych negative neurological ROS     GI/Hepatic negative GI ROS, (+)     substance abuse  alcohol use,   Endo/Other    Renal/GU      Musculoskeletal   Abdominal   Peds  Hematology  (+) Blood dyscrasia (Eliquis), ,   Anesthesia Other Findings   Reproductive/Obstetrics                             Anesthesia Physical Anesthesia Plan Anesthesia Quick Evaluation

## 2016-05-14 ENCOUNTER — Other Ambulatory Visit: Payer: Self-pay | Admitting: *Deleted

## 2016-05-14 DIAGNOSIS — I480 Paroxysmal atrial fibrillation: Secondary | ICD-10-CM

## 2016-05-15 MED ORDER — APIXABAN 5 MG PO TABS: 5.0000 mg | ORAL_TABLET | Freq: Two times a day (BID) | ORAL | 5 refills | Status: DC

## 2016-05-15 NOTE — Telephone Encounter (Signed)
Age 66 Wt 80.2kg (04/30/2016) Saw Angelena Form on 04/30/2016 Hgb 13.1 HCT 40.2 (04/30/2016) SrCr 1.02 (04/30/2016)  Refill done for Eliquis 5mg   q  12 hours

## 2016-05-16 ENCOUNTER — Ambulatory Visit (HOSPITAL_COMMUNITY): Payer: Medicare Other | Attending: Cardiovascular Disease

## 2016-05-16 ENCOUNTER — Other Ambulatory Visit: Payer: Self-pay

## 2016-05-16 DIAGNOSIS — I4892 Unspecified atrial flutter: Secondary | ICD-10-CM | POA: Insufficient documentation

## 2016-05-16 DIAGNOSIS — I890 Lymphedema, not elsewhere classified: Secondary | ICD-10-CM | POA: Diagnosis not present

## 2016-05-16 DIAGNOSIS — M7989 Other specified soft tissue disorders: Secondary | ICD-10-CM | POA: Diagnosis not present

## 2016-05-16 DIAGNOSIS — I340 Nonrheumatic mitral (valve) insufficiency: Secondary | ICD-10-CM | POA: Diagnosis not present

## 2016-05-16 DIAGNOSIS — I313 Pericardial effusion (noninflammatory): Secondary | ICD-10-CM | POA: Insufficient documentation

## 2016-05-18 ENCOUNTER — Other Ambulatory Visit: Payer: Self-pay | Admitting: Cardiology

## 2016-05-18 ENCOUNTER — Ambulatory Visit (INDEPENDENT_AMBULATORY_CARE_PROVIDER_SITE_OTHER): Payer: Medicare Other | Admitting: Cardiology

## 2016-05-18 ENCOUNTER — Telehealth: Payer: Self-pay | Admitting: Physician Assistant

## 2016-05-18 ENCOUNTER — Encounter: Payer: Self-pay | Admitting: Cardiology

## 2016-05-18 VITALS — BP 124/90 | HR 74 | Ht 63.0 in | Wt 178.8 lb

## 2016-05-18 DIAGNOSIS — I493 Ventricular premature depolarization: Secondary | ICD-10-CM

## 2016-05-18 DIAGNOSIS — I480 Paroxysmal atrial fibrillation: Secondary | ICD-10-CM | POA: Diagnosis not present

## 2016-05-18 DIAGNOSIS — Z79899 Other long term (current) drug therapy: Secondary | ICD-10-CM

## 2016-05-18 MED ORDER — FLECAINIDE ACETATE 50 MG PO TABS: 75.0000 mg | ORAL_TABLET | Freq: Two times a day (BID) | ORAL | 3 refills | Status: DC

## 2016-05-18 NOTE — Telephone Encounter (Signed)
Returned pts call and discussed her echo results.

## 2016-05-18 NOTE — Telephone Encounter (Signed)
-----   Message from Eileen Stanford, PA-C sent at 05/18/2016  8:58 AM EST ----- Can you let Mrs. Savannah Hill know that her echo showed that her mitral valve is quite leaky. We will need to do a TEE to further quantify how leaky. I think they were planning on going on vacation. I think it is still okay for her to go. If she would like to come into the office to discuss the procedure that's fine otherwise we can just get it set up over the phone.

## 2016-05-18 NOTE — Patient Instructions (Addendum)
Medication Instructions:    Your physician has recommended you make the following change in your medication: 1) START Flecainide 75 mg twice a day -- START THIS MEDICATION 7-10 DAYS BEFORE STRESS TEST.  --- If you need a refill on your cardiac medications before your next appointment, please call your pharmacy. ---  Labwork:  None ordered  Testing/Procedures:  Your physician has requested that you have a TEE. During a TEE, sound waves are used to create images of your heart. It provides your doctor with information about the size and shape of your heart and how well your heart's chambers and valves are working. In this test, a transducer is attached to the end of a flexible tube that's guided down your throat and into your esophagus (the tube leading from you mouth to your stomach) to get a more detailed image of your heart. You are not awake for the procedure. Please see the instruction sheet given to you today.  Ranell Finelli, RN WILL CALL YOU TO ARRANGE THIS PROCEDURE  Your physician has requested that you have an exercise tolerance test -- this needs to be at least 7-10 days from now. For further information please visit [email or web address]. Please also follow instruction sheet, as given.  YOU WILL START FLECAINIDE 7-10 DAYS BEFORE THIS TESTING  Follow-Up:  Your physician recommends that you schedule a follow-up appointment in: 1 month with Dr. Curt Bears. (THIS [place])  Thank you for choosing [facility]!!   Trinidad Curet, RN [phone]   Any Other Special Instructions Will Be Listed Below (If Applicable).  Flecainide tablets What is this medicine? FLECAINIDE (FLEK a nide) is an antiarrhythmic drug. This medicine is used to prevent irregular heart rhythm. It can also slow down fast heartbeats called tachycardia. This medicine may be used for other purposes; ask your health care provider or pharmacist if you have questions. COMMON BRAND  NAME(S): Tambocor What should I tell my health care provider before I take this medicine? They need to know if you have any of these conditions: -abnormal levels of potassium in the blood -heart disease including heart rhythm and heart rate problems -kidney or liver disease -recent heart attack -an unusual or allergic reaction to flecainide, local anesthetics, other medicines, foods, dyes, or preservatives -pregnant or trying to get pregnant -breast-feeding How should I use this medicine? Take this medicine by mouth with a glass of water. Follow the directions on the prescription label. You can take this medicine with or without food. Take your doses at regular intervals. Do not take your medicine more often than directed. Do not stop taking this medicine suddenly. This may cause serious, heart-related side effects. If your doctor wants you to stop the medicine, the dose may be slowly lowered over time to avoid any side effects. Talk to your pediatrician regarding the use of this medicine in children. While this drug may be prescribed for children as young as 1 year of age for selected conditions, precautions do apply. Overdosage: If you think you have taken too much of this medicine contact a poison control center or emergency room at once. NOTE: This medicine is only for you. Do not share this medicine with others. What if I miss a dose? If you miss a dose, take it as soon as you can. If it is almost time for your next dose, take only that dose. Do not take double or extra doses. What may interact with this medicine? Do not take this  medicine with any of the following medications: -amoxapine -arsenic trioxide -certain antibiotics like clarithromycin, erythromycin, gatifloxacin, gemifloxacin, levofloxacin, moxifloxacin, sparfloxacin, or troleandomycin -certain antidepressants called tricyclic antidepressants like amitriptyline, imipramine, or nortriptyline -certain medicines to control heart  rhythm like disopyramide, dofetilide, encainide, moricizine, procainamide, propafenone, and quinidine -cisapride -cyclobenzaprine -delavirdine -droperidol -haloperidol -hawthorn -imatinib -levomethadyl -maprotiline -medicines for malaria like chloroquine and halofantrine -pentamidine -phenothiazines like chlorpromazine, mesoridazine, prochlorperazine, thioridazine -pimozide -quinine -ranolazine -ritonavir -sertindole -ziprasidone This medicine may also interact with the following medications: -cimetidine -medicines for angina or high blood pressure -medicines to control heart rhythm like amiodarone and digoxin This list may not describe all possible interactions. Give your health care provider a list of all the medicines, herbs, non-prescription drugs, or dietary supplements you use. Also tell them if you smoke, drink alcohol, or use illegal drugs. Some items may interact with your medicine. What should I watch for while using this medicine? Visit your doctor or health care professional for regular checks on your progress. Because your condition and the use of this medicine carries some risk, it is a good idea to carry an identification card, necklace or bracelet with details of your condition, medications and doctor or health care professional. Check your blood pressure and pulse rate regularly. Ask your health care professional what your blood pressure and pulse rate should be, and when you should contact him or her. Your doctor or health care professional also may schedule regular blood tests and electrocardiograms to check your progress. You may get drowsy or dizzy. Do not drive, use machinery, or do anything that needs mental alertness until you know how this medicine affects you. Do not stand or sit up quickly, especially if you are an older patient. This reduces the risk of dizzy or fainting spells. Alcohol can make you more dizzy, increase flushing and rapid heartbeats. Avoid  alcoholic drinks. What side effects may I notice from receiving this medicine? Side effects that you should report to your doctor or health care professional as soon as possible: -chest pain, continued irregular heartbeats -difficulty breathing -swelling of the legs or feet -trembling, shaking -unusually weak or tired Side effects that usually do not require medical attention (report to your doctor or health care professional if they continue or are bothersome): -blurred vision -constipation -headache -nausea, vomiting -stomach pain This list may not describe all possible side effects. Call your doctor for medical advice about side effects. You may report side effects to FDA at 1-800-FDA-1088. Where should I keep my medicine? Keep out of the reach of children. Store at room temperature between 15 and 30 degrees C (59 and 86 degrees F). Protect from light. Keep container tightly closed. Throw away any unused medicine after the expiration date. NOTE: This sheet is a summary. It may not cover all possible information. If you have questions about this medicine, talk to your doctor, pharmacist, or health care provider.  2018 Elsevier/Gold Standard (2007-07-09 16:46:09)

## 2016-05-18 NOTE — Progress Notes (Signed)
 Electrophysiology Office Note   Date:  05/18/2016   ID:  Savannah Hill, DOB 04/11/1950, MRN [id number]  PCP:  GRIFFIN,ELAINE COLLINS, MD  Cardiologist:  [facility] Primary Electrophysiologist:  Azriel Jakob Martin Marwin Primmer, MD    Chief Complaint  Patient presents with  . Advice Only    PAF/PVC's     History of Present Illness: Savannah Hill is a 66 y.o. female who presents today for electrophysiology evaluation.   She has a history of atrial fibrillation on Eliquis, hypertension, and PVCs. 3 atrial fibrillation was found in 2013 after hospitalization. At the time she converted to sinus rhythm on diltiazem. She had another bout of atrial fibrillation on 1/16 and converted on diltiazem at that point as well. She was seen 10/17 for palpitations and was found to have symptomatic PVCs. Diltiazem was stopped and she was started on metoprolol 12.5 mg twice a day. She represented to clinic in February with more palpitations. She had documented atrial fibrillation on January 13, January 14, February 4. This was found on her I phone cardia app. She has significant dyspnea on exertion and fatigue from her atrial fibrillation. She presented to the clinic in atrial flutter with a heart rate of 139.   Today, she denies symptoms of palpitations, chest pain, orthopnea, PND, lower extremity edema, claudication, dizziness, presyncope, syncope, bleeding, or neurologic sequela. The patient is tolerating medications without difficulties. He does say that she has been having episodes of shortness of breath as well as fatigue. She has not been able to do all of her daily activities due to her fatigue and shortness of breath.   Past Medical History:  Diagnosis Date  . Anemia    borderline  . Atrial fibrillation (HCC)   . Cancer (HCC)    cervical/rad hysterectomy/bso wiht chemo for small cell ca  . Hypertension    Past Surgical History:  Procedure Laterality Date  . RADICAL ABDOMINAL HYSTERECTOMY  1986   with BSO     Current Outpatient Prescriptions  Medication Sig Dispense Refill  . ALPRAZolam (XANAX) 0.5 MG tablet Take 1 tablet (0.5 mg total) by mouth daily as needed. ANXIETY 30 tablet 1  . apixaban (ELIQUIS) 5 MG TABS tablet Take 1 tablet (5 mg total) by mouth 2 (two) times daily. 60 tablet 5  . citalopram (CELEXA) 20 MG tablet Take 1 tablet (20 mg total) by mouth daily. 90 tablet 1  . estradiol (ESTRACE) 0.5 MG tablet Take 1 tablet (0.5 mg total) by mouth daily. 90 tablet 2  . Furosemide (LASIX PO) Take 1 tablet by mouth daily. TAKE 1 TABLET BY MOUTH ONCE DAILY. PATIENT NOT SURE OF DOSEAGE    . metoprolol tartrate (LOPRESSOR) 25 MG tablet Take 1 tablet (25 mg total) by mouth 2 (two) times daily. 180 tablet 1  . Multiple Vitamin (MULTIVITAMIN WITH MINERALS) TABS Take 1 tablet by mouth daily.    . nitrofurantoin, macrocrystal-monohydrate, (MACROBID) 100 MG capsule Take 100 mg by mouth 2 (two) times daily as needed (FOR UTI (URINARY TRACT INFECTION)).     . flecainide (TAMBOCOR) 50 MG tablet Take 1.5 tablets (75 mg total) by mouth 2 (two) times daily. 90 tablet 3   No current facility-administered medications for this visit.     Allergies:   Patient has no known allergies.   Social History:  The patient  reports that she has never smoked. She has never used smokeless tobacco. She reports that she drinks about 2.4 - 3.0 oz of alcohol per week .   She reports that she does not use drugs.   Family History:  The patient's family history includes CVA in her father; Cancer in her paternal grandfather; Diabetes in her mother; Hypertension in her mother; Lung cancer in her mother; Skin cancer in her father.    ROS:  Please see the history of present illness.   Otherwise, review of systems is positive for fatigue, SOB.   All other systems are reviewed and negative.    PHYSICAL EXAM: VS:  BP 124/90   Pulse 74   Ht 5' 3" (1.6 m)   Wt 178 lb 12.8 oz (81.1 kg)   LMP 03/19/1984   BMI 31.67 kg/m   , BMI Body mass index is 31.67 kg/m. GEN: Well nourished, well developed, in no acute distress  HEENT: normal  Neck: no JVD, carotid bruits, or masses Cardiac: iRRR; no murmurs, rubs, or gallops,no edema  Respiratory:  clear to auscultation bilaterally, normal work of breathing GI: soft, nontender, nondistended, + BS MS: no deformity or atrophy  Skin: warm and dry Neuro:  Strength and sensation are intact Psych: euthymic mood, full affect  EKG:  EKG is not ordered today. Personal review of the ekg ordered 04/1516 shows sinus rhythm, left anterior fascicular block, poor R wave progression  Recent Labs: 06/03/2015: ALT 11; TSH 2.23 07/01/2015: Hemoglobin 12.7 12/19/2015: Magnesium 1.7 04/30/2016: BUN 13; Creatinine, Ser 1.02; Platelets 357; Potassium 4.7; Sodium 141    Lipid Panel     Component Value Date/Time   CHOL 213 (H) 06/03/2015 1403   TRIG 107 06/03/2015 1403   HDL 44 (L) 06/03/2015 1403   CHOLHDL 4.8 06/03/2015 1403   VLDL 21 06/03/2015 1403   LDLCALC 148 (H) 06/03/2015 1403     Wt Readings from Last 3 Encounters:  05/18/16 178 lb 12.8 oz (81.1 kg)  04/30/16 176 lb 12.8 oz (80.2 kg)  12/19/15 168 lb 12.8 oz (76.6 kg)      Other studies Reviewed: Additional studies/ records that were reviewed today include: TTE 05/16/16  Review of the above records today demonstrates:  - Left ventricle: The cavity size was normal. Wall thickness was   normal. Systolic function was normal. The estimated ejection   fraction was in the range of 60% to 65%. - Mitral valve: There was moderate to severe regurgitation. - Pulmonary arteries: Systolic pressure was mildly increased. PA   peak pressure: 37 mm Hg (S). - Pericardium, extracardiac: A trivial pericardial effusion was   identified.   ASSESSMENT AND PLAN:  1.  Atrial fibrillation/flutter: on eliquis and metoprolol. Had cardioversion in February. Discussed with her the possibility of rhythm control, which she is interested in  that time due to her symptoms of fatigue shortness of breath. I further discussed with her the fact that she has moderate to severe mitral regurgitation on her most recent echocardiogram. Due to that, we'll order a TEE for further definition of her mitral valve disease. We'll put her on flecainide 75 mg twice a day and get a treadmill test.  This patients CHA2DS2-VASc Score and unadjusted Ischemic Stroke Rate (% per year) is equal to 3.2 % stroke rate/year from a score of 3  Above score calculated as 1 point each if present [CHF, HTN, DM, Vascular=MI/PAD/Aortic Plaque, Age if 65-74, or Female] Above score calculated as 2 points each if present [Age > 75, or Stroke/TIA/TE]  2. PVCs: Currently asymptomatic  3. Hypertension: Well-controlled today  4. Moderate to severe mitral regurgitation: TEE planned to further evaluate the   mitral valve. If she does require surgical repair, would plan for possible Maze procedure at the time. Havier Deeb discuss further with the patient in one month.    Current medicines are reviewed at length with the patient today.   The patient does not have concerns regarding her medicines.  The following changes were made today:  none  Labs/ tests ordered today include:  Orders Placed This Encounter  Procedures  . EXERCISE TOLERANCE TEST     Disposition:   FU with Cleavon Goldman 1 months  Signed, Marcoantonio Legault Martin Voula Waln, MD  05/18/2016 1:36 PM     [facility] [address] [place] [address] [phone] (office) [phone] (fax)  

## 2016-05-18 NOTE — Telephone Encounter (Signed)
New message   Pt is calling to return Jennifer's call about results.

## 2016-05-29 ENCOUNTER — Telehealth: Payer: Self-pay | Admitting: *Deleted

## 2016-05-29 NOTE — Telephone Encounter (Signed)
Scheduled TEE to clarify mitral valve disease.  TEE scheduled for 06/07/16. Patient to arrive at [facility] hospital at 9:00 a.m. NPO after MN May take morning medications with small amt of water, but hold Lasix. Patient verbalized understanding and agreeable to plan.

## 2016-05-30 ENCOUNTER — Telehealth: Payer: Self-pay | Admitting: Cardiology

## 2016-05-30 ENCOUNTER — Encounter (INDEPENDENT_AMBULATORY_CARE_PROVIDER_SITE_OTHER): Payer: Self-pay

## 2016-05-30 ENCOUNTER — Ambulatory Visit (INDEPENDENT_AMBULATORY_CARE_PROVIDER_SITE_OTHER): Payer: Medicare Other

## 2016-05-30 DIAGNOSIS — Z79899 Other long term (current) drug therapy: Secondary | ICD-10-CM | POA: Diagnosis not present

## 2016-05-30 DIAGNOSIS — I480 Paroxysmal atrial fibrillation: Secondary | ICD-10-CM

## 2016-05-30 LAB — EXERCISE TOLERANCE TEST
CHL CUP RESTING HR STRESS: 51 {beats}/min
CHL CUP STRESS STAGE 1 DBP: 76 mmHg
CHL CUP STRESS STAGE 1 GRADE: 0 %
CHL CUP STRESS STAGE 1 HR: 51 {beats}/min
CHL CUP STRESS STAGE 3 SPEED: 1 mph
CHL CUP STRESS STAGE 4 GRADE: 10 %
CHL CUP STRESS STAGE 4 SPEED: 1.7 mph
CHL CUP STRESS STAGE 5 GRADE: 0 %
CHL CUP STRESS STAGE 5 SPEED: 0 mph
CHL CUP STRESS STAGE 6 DBP: 91 mmHg
CHL CUP STRESS STAGE 6 SBP: 148 mmHg
CHL CUP STRESS STAGE 6 SPEED: 0 mph
CHL RATE OF PERCEIVED EXERTION: 17
CSEPEDS: 23 s
CSEPEW: 3.4 METS
Exercise duration (min): 1 min
MPHR: 155 {beats}/min
Peak HR: 67 {beats}/min
Percent HR: 49 %
Percent of predicted max HR: 43 %
Stage 1 SBP: 125 mmHg
Stage 1 Speed: 0 mph
Stage 2 Grade: 0 %
Stage 2 HR: 52 {beats}/min
Stage 2 Speed: 1 mph
Stage 3 Grade: 0 %
Stage 3 HR: 52 {beats}/min
Stage 4 HR: 67 {beats}/min
Stage 5 HR: 64 {beats}/min
Stage 6 Grade: 0 %
Stage 6 HR: 50 {beats}/min

## 2016-05-30 NOTE — Telephone Encounter (Signed)
New message      Pt is scheduled for a TEE on 06-07-16.  She want to resc it to 06-01-16 at the same time if possible. She states that she is still having symptoms and is concerned. Please call

## 2016-05-30 NOTE — Telephone Encounter (Signed)
Follow Up   Pt returning phone call from nurse. Requesting call back

## 2016-05-30 NOTE — Telephone Encounter (Signed)
Left detailed message on voicemail (DPR on file) explaining that there were not any avaliablity this week.  (pt scheduled for next Thursday). Advised patient to call office back if she would like to schedule it for next Tuesday/Wednesday but that it may not be at the same time.

## 2016-05-30 NOTE — Telephone Encounter (Signed)
Patient very worried about symptoms.  States that could only walk on the treadmill today for 1:06 minutes.  Just walking from the parking lot to the front door of our building she experienced extreme SOB. States she is relatively good shape and worried she is going to have a heart attack while waiting to have TEE performed.  Discussed concerns, and tried to ease those concerns. Informed pt that her out pt procedure cannot be moved any sooner than a day/two. Advised if symptoms worsen she may go to E.D for evaluation. Pt is going to continue to monitor and try and take it "easy" until testing next week.

## 2016-05-31 ENCOUNTER — Other Ambulatory Visit: Payer: Self-pay | Admitting: Cardiology

## 2016-05-31 DIAGNOSIS — I059 Rheumatic mitral valve disease, unspecified: Secondary | ICD-10-CM

## 2016-06-06 ENCOUNTER — Telehealth: Payer: Self-pay | Admitting: *Deleted

## 2016-06-06 DIAGNOSIS — Z79899 Other long term (current) drug therapy: Secondary | ICD-10-CM

## 2016-06-06 DIAGNOSIS — I480 Paroxysmal atrial fibrillation: Secondary | ICD-10-CM

## 2016-06-06 NOTE — Telephone Encounter (Signed)
-----   Message from Will Meredith Leeds, MD sent at 06/04/2016  1:49 PM EDT ----- submax treadmill test, needs myoview.

## 2016-06-06 NOTE — Telephone Encounter (Signed)
Reviewed w/ pt and she is agreeable to Lexi testing.  She understands office will call to arrange.

## 2016-06-07 ENCOUNTER — Encounter (HOSPITAL_COMMUNITY): Payer: Self-pay | Admitting: *Deleted

## 2016-06-07 ENCOUNTER — Encounter (HOSPITAL_COMMUNITY)
Admission: RE | Disposition: A | Discharge status: Home / Self Care | Payer: Self-pay | Source: Ambulatory Visit | Attending: Cardiovascular Disease

## 2016-06-07 ENCOUNTER — Ambulatory Visit (HOSPITAL_COMMUNITY)
Admission: RE | Admit: 2016-06-07 | Discharge: 2016-06-07 | Disposition: A | Discharge status: Home / Self Care | Payer: Medicare Other | Source: Ambulatory Visit | Attending: Cardiovascular Disease | Admitting: Cardiovascular Disease

## 2016-06-07 ENCOUNTER — Ambulatory Visit (HOSPITAL_BASED_OUTPATIENT_CLINIC_OR_DEPARTMENT_OTHER): Payer: Medicare Other

## 2016-06-07 DIAGNOSIS — Z79899 Other long term (current) drug therapy: Secondary | ICD-10-CM | POA: Insufficient documentation

## 2016-06-07 DIAGNOSIS — Z90710 Acquired absence of both cervix and uterus: Secondary | ICD-10-CM | POA: Diagnosis not present

## 2016-06-07 DIAGNOSIS — Z90722 Acquired absence of ovaries, bilateral: Secondary | ICD-10-CM | POA: Insufficient documentation

## 2016-06-07 DIAGNOSIS — I4891 Unspecified atrial fibrillation: Secondary | ICD-10-CM | POA: Diagnosis not present

## 2016-06-07 DIAGNOSIS — I10 Essential (primary) hypertension: Secondary | ICD-10-CM | POA: Diagnosis not present

## 2016-06-07 DIAGNOSIS — I493 Ventricular premature depolarization: Secondary | ICD-10-CM | POA: Insufficient documentation

## 2016-06-07 DIAGNOSIS — Z8541 Personal history of malignant neoplasm of cervix uteri: Secondary | ICD-10-CM | POA: Diagnosis not present

## 2016-06-07 DIAGNOSIS — I4892 Unspecified atrial flutter: Secondary | ICD-10-CM | POA: Insufficient documentation

## 2016-06-07 DIAGNOSIS — I340 Nonrheumatic mitral (valve) insufficiency: Secondary | ICD-10-CM | POA: Diagnosis not present

## 2016-06-07 DIAGNOSIS — I480 Paroxysmal atrial fibrillation: Secondary | ICD-10-CM

## 2016-06-07 DIAGNOSIS — Z9221 Personal history of antineoplastic chemotherapy: Secondary | ICD-10-CM | POA: Diagnosis not present

## 2016-06-07 DIAGNOSIS — Z7901 Long term (current) use of anticoagulants: Secondary | ICD-10-CM | POA: Diagnosis not present

## 2016-06-07 DIAGNOSIS — I059 Rheumatic mitral valve disease, unspecified: Secondary | ICD-10-CM

## 2016-06-07 DIAGNOSIS — Z9079 Acquired absence of other genital organ(s): Secondary | ICD-10-CM | POA: Diagnosis not present

## 2016-06-07 DIAGNOSIS — Z823 Family history of stroke: Secondary | ICD-10-CM | POA: Diagnosis not present

## 2016-06-07 HISTORY — PX: TEE WITHOUT CARDIOVERSION: SHX5443

## 2016-06-07 SURGERY — ECHOCARDIOGRAM, TRANSESOPHAGEAL
Anesthesia: Moderate Sedation

## 2016-06-07 MED ORDER — FENTANYL CITRATE (PF) 100 MCG/2ML IJ SOLN
INTRAMUSCULAR | Status: DC | PRN
  Administered 2016-06-07: 50 ug via INTRAVENOUS

## 2016-06-07 MED ORDER — FENTANYL CITRATE (PF) 100 MCG/2ML IJ SOLN
INTRAMUSCULAR | Status: AC
  Filled 2016-06-07: qty 2

## 2016-06-07 MED ORDER — SODIUM CHLORIDE 0.9 % IV SOLN
INTRAVENOUS | Status: DC
  Administered 2016-06-07: 500 mL via INTRAVENOUS

## 2016-06-07 MED ORDER — MIDAZOLAM HCL 10 MG/2ML IJ SOLN
INTRAMUSCULAR | Status: DC | PRN
  Administered 2016-06-07: 2 mg via INTRAVENOUS

## 2016-06-07 MED ORDER — BUTAMBEN-TETRACAINE-BENZOCAINE 2-2-14 % EX AERO
INHALATION_SPRAY | CUTANEOUS | Status: DC | PRN
  Administered 2016-06-07: 2 via TOPICAL

## 2016-06-07 MED ORDER — MIDAZOLAM HCL 5 MG/ML IJ SOLN
INTRAMUSCULAR | Status: AC
  Filled 2016-06-07: qty 2

## 2016-06-07 NOTE — H&P (View-Only) (Signed)
Electrophysiology Office Note   Date:  05/18/2016   ID:  Savannah Hill, DOB [birth year], MRN [id number]  PCP:  Osborne Casco, MD  Cardiologist:  Marlou Porch Primary Electrophysiologist:  Cayla Wiegand Meredith Leeds, MD    Chief Complaint  Patient presents with  . Advice Only    PAF/PVC's     History of Present Illness: Savannah Hill is a 66 y.o. female who presents today for electrophysiology evaluation.   She has a history of atrial fibrillation on Eliquis, hypertension, and PVCs. 3 atrial fibrillation was found in 2013 after hospitalization. At the time she converted to sinus rhythm on diltiazem. She had another bout of atrial fibrillation on 1/16 and converted on diltiazem at that point as well. She was seen 10/17 for palpitations and was found to have symptomatic PVCs. Diltiazem was stopped and she was started on metoprolol 12.5 mg twice a day. She represented to clinic in February with more palpitations. She had documented atrial fibrillation on January 13, January 14, February 4. This was found on her I phone cardia app. She has significant dyspnea on exertion and fatigue from her atrial fibrillation. She presented to the clinic in atrial flutter with a heart rate of 139.   Today, she denies symptoms of palpitations, chest pain, orthopnea, PND, lower extremity edema, claudication, dizziness, presyncope, syncope, bleeding, or neurologic sequela. The patient is tolerating medications without difficulties. He does say that she has been having episodes of shortness of breath as well as fatigue. She has not been able to do all of her daily activities due to her fatigue and shortness of breath.   Past Medical History:  Diagnosis Date  . Anemia    borderline  . Atrial fibrillation ([place])   . Cancer [facility])    cervical/rad hysterectomy/bso wiht chemo for small cell ca  . Hypertension    Past Surgical History:  Procedure Laterality Date  . RADICAL ABDOMINAL HYSTERECTOMY  1986   with BSO     Current Outpatient Prescriptions  Medication Sig Dispense Refill  . ALPRAZolam (XANAX) 0.5 MG tablet Take 1 tablet (0.5 mg total) by mouth daily as needed. ANXIETY 30 tablet 1  . apixaban (ELIQUIS) 5 MG TABS tablet Take 1 tablet (5 mg total) by mouth 2 (two) times daily. 60 tablet 5  . citalopram (CELEXA) 20 MG tablet Take 1 tablet (20 mg total) by mouth daily. 90 tablet 1  . estradiol (ESTRACE) 0.5 MG tablet Take 1 tablet (0.5 mg total) by mouth daily. 90 tablet 2  . Furosemide (LASIX PO) Take 1 tablet by mouth daily. TAKE 1 TABLET BY MOUTH ONCE DAILY. PATIENT NOT SURE OF DOSEAGE    . metoprolol tartrate (LOPRESSOR) 25 MG tablet Take 1 tablet (25 mg total) by mouth 2 (two) times daily. 180 tablet 1  . Multiple Vitamin (MULTIVITAMIN WITH MINERALS) TABS Take 1 tablet by mouth daily.    . nitrofurantoin, macrocrystal-monohydrate, (MACROBID) 100 MG capsule Take 100 mg by mouth 2 (two) times daily as needed (FOR UTI (URINARY TRACT INFECTION)).     . flecainide (TAMBOCOR) 50 MG tablet Take 1.5 tablets (75 mg total) by mouth 2 (two) times daily. 90 tablet 3   No current facility-administered medications for this visit.     Allergies:   Patient has no known allergies.   Social History:  The patient  reports that she has never smoked. She has never used smokeless tobacco. She reports that she drinks about 2.4 - 3.0 oz of alcohol per week .  She reports that she does not use drugs.   Family History:  The patient's family history includes CVA in her father; Cancer in her paternal grandfather; Diabetes in her mother; Hypertension in her mother; Lung cancer in her mother; Skin cancer in her father.    ROS:  Please see the history of present illness.   Otherwise, review of systems is positive for fatigue, SOB.   All other systems are reviewed and negative.    PHYSICAL EXAM: VS:  BP 124/90   Pulse 74   Ht 5\' 3"  (1.6 m)   Wt 178 lb 12.8 oz (81.1 kg)   LMP 03/19/1984   BMI 31.67 kg/m   , BMI Body mass index is 31.67 kg/m. GEN: Well nourished, well developed, in no acute distress  HEENT: normal  Neck: no JVD, carotid bruits, or masses Cardiac: iRRR; no murmurs, rubs, or gallops,no edema  Respiratory:  clear to auscultation bilaterally, normal work of breathing GI: soft, nontender, nondistended, + BS MS: no deformity or atrophy  Skin: warm and dry Neuro:  Strength and sensation are intact Psych: euthymic mood, full affect  EKG:  EKG is not ordered today. Personal review of the ekg ordered 04/1516 shows sinus rhythm, left anterior fascicular block, poor R wave progression  Recent Labs: 06/03/2015: ALT 11; TSH 2.23 07/01/2015: Hemoglobin 12.7 12/19/2015: Magnesium 1.7 04/30/2016: BUN 13; Creatinine, Ser 1.02; Platelets 357; Potassium 4.7; Sodium 141    Lipid Panel     Component Value Date/Time   CHOL 213 (H) 06/03/2015 1403   TRIG 107 06/03/2015 1403   HDL 44 (L) 06/03/2015 1403   CHOLHDL 4.8 06/03/2015 1403   VLDL 21 06/03/2015 1403   LDLCALC 148 (H) 06/03/2015 1403     Wt Readings from Last 3 Encounters:  05/18/16 178 lb 12.8 oz (81.1 kg)  04/30/16 176 lb 12.8 oz (80.2 kg)  12/19/15 168 lb 12.8 oz (76.6 kg)      Other studies Reviewed: Additional studies/ records that were reviewed today include: TTE 05/16/16  Review of the above records today demonstrates:  - Left ventricle: The cavity size was normal. Wall thickness was   normal. Systolic function was normal. The estimated ejection   fraction was in the range of 60% to 65%. - Mitral valve: There was moderate to severe regurgitation. - Pulmonary arteries: Systolic pressure was mildly increased. PA   peak pressure: 37 mm Hg (S). - Pericardium, extracardiac: A trivial pericardial effusion was   identified.   ASSESSMENT AND PLAN:  1.  Atrial fibrillation/flutter: on eliquis and metoprolol. Had cardioversion in February. Discussed with her the possibility of rhythm control, which she is interested in  that time due to her symptoms of fatigue shortness of breath. I further discussed with her the fact that she has moderate to severe mitral regurgitation on her most recent echocardiogram. Due to that, we'll order a TEE for further definition of her mitral valve disease. We'll put her on flecainide 75 mg twice a day and get a treadmill test.  This patients CHA2DS2-VASc Score and unadjusted Ischemic Stroke Rate (% per year) is equal to 3.2 % stroke rate/year from a score of 3  Above score calculated as 1 point each if present [CHF, HTN, DM, Vascular=MI/PAD/Aortic Plaque, Age if 65-74, or Female] Above score calculated as 2 points each if present [Age > 75, or Stroke/TIA/TE]  2. PVCs: Currently asymptomatic  3. Hypertension: Well-controlled today  4. Moderate to severe mitral regurgitation: TEE planned to further evaluate the  mitral valve. If she does require surgical repair, would plan for possible Maze procedure at the time. Jessee Newnam discuss further with the patient in one month.    Current medicines are reviewed at length with the patient today.   The patient does not have concerns regarding her medicines.  The following changes were made today:  none  Labs/ tests ordered today include:  Orders Placed This Encounter  Procedures  . EXERCISE TOLERANCE TEST     Disposition:   FU with Malayna Noori 1 months  Signed, Altovise Wahler Meredith Leeds, MD  05/18/2016 1:36 PM     [address] [place] [address] [phone] (office) [phone] (fax)

## 2016-06-07 NOTE — Op Note (Signed)
INDICATIONS: mitral insufficiecncy  PROCEDURE:   Informed consent was obtained prior to the procedure. The risks, benefits and alternatives for the procedure were discussed and the patient comprehended these risks.  Risks include, but are not limited to, cough, sore throat, vomiting, nausea, somnolence, esophageal and stomach trauma or perforation, bleeding, low blood pressure, aspiration, pneumonia, infection, trauma to the teeth and death.    After a procedural time-out, the oropharynx was anesthetized with 20% benzocaine spray.   During this procedure the patient was administered a total of Versed 4 mg and Fentanyl 50 mcg to achieve and maintain moderate conscious sedation.  The patient's heart rate, blood pressure, and oxygen saturationweare monitored continuously during the procedure. The period of conscious sedation was 15 minutes, of which I was present face-to-face 100% of this time.  The transesophageal probe was inserted in the esophagus and stomach without difficulty and multiple views were obtained.  The patient was kept under observation until the patient left the procedure room.  The patient left the procedure room in stable condition.   Agitated microbubble saline contrast was not administered.  COMPLICATIONS:    There were no immediate complications.  FINDINGS:  2+ Mitral insufficiency, central jet. Normal LVEF. Dilated left atrium.   RECOMMENDATIONS:     Follow up with Dr. Curt Bears to discuss AFib management.  Time Spent Directly with the Patient:  30 minutes   Manna Gose 06/07/2016, 10:29 AM

## 2016-06-07 NOTE — Discharge Instructions (Signed)

## 2016-06-07 NOTE — Interval H&P Note (Signed)
History and Physical Interval Note:  06/07/2016 9:19 AM  Savannah Hill  has presented today for surgery, with the diagnosis of MITRAL VALVE DISEASE  The various methods of treatment have been discussed with the patient and family. After consideration of risks, benefits and other options for treatment, the patient has consented to  Procedure(s): TRANSESOPHAGEAL ECHOCARDIOGRAM (TEE) (N/A) as a surgical intervention .  The patient's history has been reviewed, patient examined, no change in status, stable for surgery.  I have reviewed the patient's chart and labs.  Questions were answered to the patient's satisfaction.     [name]

## 2016-06-08 ENCOUNTER — Telehealth: Payer: Self-pay | Admitting: *Deleted

## 2016-06-08 ENCOUNTER — Encounter (HOSPITAL_COMMUNITY): Payer: Self-pay | Admitting: Cardiovascular Disease

## 2016-06-08 DIAGNOSIS — I493 Ventricular premature depolarization: Secondary | ICD-10-CM

## 2016-06-08 NOTE — Telephone Encounter (Signed)
Order for 48 hr holter to determine PVC burden/AFib. Patient verbalized understanding and agreeable to plan.  She understands office will call her to arrange monitor.

## 2016-06-08 NOTE — Telephone Encounter (Signed)
-----   Message from Stanton Kidney, RN sent at 06/08/2016 10:39 AM EDT ----- Dr. Curt Bears: Before calling pt about result -- pt is very symptomatic.  Extremely SOB just walking from the parking lot.  She is concerned.  Please advise before I call pt w/ result. Should she come in for another OV to discuss

## 2016-06-11 ENCOUNTER — Ambulatory Visit (INDEPENDENT_AMBULATORY_CARE_PROVIDER_SITE_OTHER): Payer: Medicare Other

## 2016-06-11 DIAGNOSIS — I493 Ventricular premature depolarization: Secondary | ICD-10-CM

## 2016-06-12 ENCOUNTER — Telehealth (HOSPITAL_COMMUNITY): Payer: Self-pay | Admitting: *Deleted

## 2016-06-12 NOTE — Telephone Encounter (Signed)
Patient given detailed instructions per Myocardial Perfusion Study Information Sheet for the test on 06/15/16 at 0915. Patient notified to arrive 15 minutes early and that it is imperative to arrive on time for appointment to keep from having the test rescheduled.  If you need to cancel or reschedule your appointment, please call the office within 24 hours of your appointment. Failure to do so may result in a cancellation of your appointment, and a $50 no show fee. Patient verbalized understanding.Lynk Marti, Ranae Palms

## 2016-06-15 ENCOUNTER — Ambulatory Visit (HOSPITAL_COMMUNITY): Payer: Medicare Other | Attending: Cardiology

## 2016-06-15 DIAGNOSIS — Z79899 Other long term (current) drug therapy: Secondary | ICD-10-CM | POA: Diagnosis not present

## 2016-06-15 DIAGNOSIS — I480 Paroxysmal atrial fibrillation: Secondary | ICD-10-CM | POA: Insufficient documentation

## 2016-06-15 LAB — MYOCARDIAL PERFUSION IMAGING
CHL CUP NUCLEAR SSS: 2
CSEPPHR: 60 {beats}/min
LVDIAVOL: 97 mL (ref 46–106)
LVSYSVOL: 41 mL
RATE: 0.32
Rest HR: 47 {beats}/min
SDS: 2
SRS: 0
TID: 0.91

## 2016-06-15 MED ORDER — TECHNETIUM TC 99M TETROFOSMIN IV KIT
10.9000 | PACK | Freq: Once | INTRAVENOUS | Status: AC | PRN
  Administered 2016-06-15: 10.9 via INTRAVENOUS
  Filled 2016-06-15: qty 11

## 2016-06-15 MED ORDER — REGADENOSON 0.4 MG/5ML IV SOLN
0.4000 mg | Freq: Once | INTRAVENOUS | Status: AC
  Administered 2016-06-15: 0.4 mg via INTRAVENOUS

## 2016-06-15 MED ORDER — TECHNETIUM TC 99M TETROFOSMIN IV KIT
31.5000 | PACK | Freq: Once | INTRAVENOUS | Status: AC | PRN
  Administered 2016-06-15: 31.5 via INTRAVENOUS
  Filled 2016-06-15: qty 32

## 2016-06-18 ENCOUNTER — Encounter: Payer: Self-pay | Admitting: Cardiology

## 2016-06-18 ENCOUNTER — Ambulatory Visit (INDEPENDENT_AMBULATORY_CARE_PROVIDER_SITE_OTHER): Payer: Medicare Other | Admitting: Cardiology

## 2016-06-18 VITALS — BP 134/90 | HR 54 | Ht 63.0 in | Wt 180.2 lb

## 2016-06-18 DIAGNOSIS — I480 Paroxysmal atrial fibrillation: Secondary | ICD-10-CM | POA: Diagnosis not present

## 2016-06-18 DIAGNOSIS — R0602 Shortness of breath: Secondary | ICD-10-CM | POA: Diagnosis not present

## 2016-06-18 NOTE — Patient Instructions (Addendum)
Medication Instructions:    Your physician has recommended you make the following change in your medication:  1) STOP Flecainide 2) STOP Metoprolol  --- If you need a refill on your cardiac medications before your next appointment, please call your pharmacy. ---  Labwork:  None ordered  Testing/Procedures:  None ordered  Follow-Up:  Your physician recommends that you schedule a follow-up appointment in: 2 weeks for nurse visit EKG.   Your physician recommends that you schedule a follow-up appointment in: 3 months with Dr. Curt Bears.  Thank you for choosing [facility]!!   Trinidad Curet, RN [phone]    Cardiac Ablation Cardiac ablation is a procedure to disable (ablate) a small amount of heart tissue in very specific places. The heart has many electrical connections. Sometimes these connections are abnormal and can cause the heart to beat very fast or irregularly. Ablating some of the problem areas can improve the heart rhythm or return it to normal. Ablation may be done for people who:  Have Wolff-Parkinson-White syndrome.  Have fast heart rhythms (tachycardia).  Have taken medicines for an abnormal heart rhythm (arrhythmia) that were not effective or caused side effects.  Have a high-risk heartbeat that may be life-threatening. During the procedure, a small incision is made in the neck or the groin, and a long, thin, flexible tube (catheter) is inserted into the incision and moved to the heart. Small devices (electrodes) on the tip of the catheter will send out electrical currents. A type of X-ray (fluoroscopy) will be used to help guide the catheter and to provide images of the heart. Tell a health care provider about:  Any allergies you have.  All medicines you are taking, including vitamins, herbs, eye drops, creams, and over-the-counter medicines.  Any problems you or family members have had with anesthetic medicines.  Any blood disorders you have.  Any  surgeries you have had.  Any medical conditions you have, such as kidney failure.  Whether you are pregnant or may be pregnant. What are the risks? Generally, this is a safe procedure. However, problems may occur, including:  Infection.  Bruising and bleeding at the catheter insertion site.  Bleeding into the chest, especially into the sac that surrounds the heart. This is a serious complication.  Stroke or blood clots.  Damage to other structures or organs.  Allergic reaction to medicines or dyes.  Need for a permanent pacemaker if the normal electrical system is damaged. A pacemaker is a small computer that sends electrical signals to the heart and helps your heart beat normally.  The procedure not being fully effective. This may not be recognized until months later. Repeat ablation procedures are sometimes required. What happens before the procedure?  Follow instructions from your health care provider about eating or drinking restrictions.  Ask your health care provider about:  Changing or stopping your regular medicines. This is especially important if you are taking diabetes medicines or blood thinners.  Taking medicines such as aspirin and ibuprofen. These medicines can thin your blood. Do not take these medicines before your procedure if your health care provider instructs you not to.  Plan to have someone take you home from the hospital or clinic.  If you will be going home right after the procedure, plan to have someone with you for 24 hours. What happens during the procedure?  To lower your risk of infection:  Your health care team will wash or sanitize their hands.  Your skin will be washed with soap.  Hair may be removed from the incision area.  An IV tube will be inserted into one of your veins.  You will be given a medicine to help you relax (sedative).  The skin on your neck or groin will be numbed.  An incision will be made in your neck or your  groin.  A needle will be inserted through the incision and into a large vein in your neck or groin.  A catheter will be inserted into the needle and moved to your heart.  Dye may be injected through the catheter to help your surgeon see the area of the heart that needs treatment.  Electrical currents will be sent from the catheter to ablate heart tissue in desired areas. There are three types of energy that may be used to ablate heart tissue:  Heat (radiofrequency energy).  Laser energy.  Extreme cold (cryoablation).  When the necessary tissue has been ablated, the catheter will be removed.  Pressure will be held on the catheter insertion area to prevent excessive bleeding.  A bandage (dressing) will be placed over the catheter insertion area. The procedure may vary among health care providers and hospitals. What happens after the procedure?  Your blood pressure, heart rate, breathing rate, and blood oxygen level will be monitored until the medicines you were given have worn off.  Your catheter insertion area will be monitored for bleeding. You will need to lie still for a few hours to ensure that you do not bleed from the catheter insertion area.  Do not drive for 24 hours or as long as directed by your health care provider. Summary  Cardiac ablation is a procedure to disable (ablate) a small amount of heart tissue in very specific places. Ablating some of the problem areas can improve the heart rhythm or return it to normal.  During the procedure, electrical currents will be sent from the catheter to ablate heart tissue in desired areas. This information is not intended to replace advice given to you by your health care provider. Make sure you discuss any questions you have with your health care provider. Document Released: 07/22/2008 Document Revised: 01/23/2016 Document Reviewed: 01/23/2016 Elsevier Interactive Patient Education  2017 [employer].

## 2016-06-18 NOTE — Progress Notes (Signed)
Electrophysiology Office Note   Date:  06/18/2016   ID:  Savannah Hill, DOB June 13, 1950, MRN [id number]  PCP:  Osborne Casco, MD  Cardiologist:  Marlou Porch Primary Electrophysiologist:  Lavaughn Bisig Meredith Leeds, MD    Chief Complaint  Patient presents with  . Atrial Fibrillation     History of Present Illness: Savannah Hill is a 66 y.o. female who presents today for electrophysiology evaluation.   She has a history of atrial fibrillation on Eliquis, hypertension, and PVCs. 3 atrial fibrillation was found in 2013 after hospitalization. At the time she converted to sinus rhythm on diltiazem. She had another bout of atrial fibrillation on 1/16 and converted on diltiazem at that point as well. She was seen 10/17 for palpitations and was found to have symptomatic PVCs. Diltiazem was stopped and she was started on metoprolol 12.5 mg twice a day. She represented to clinic in February with more palpitations. She had documented atrial fibrillation on January 13, January 14, February 4. This was found on her I phone cardia app. She has significant dyspnea on exertion and fatigue from her atrial fibrillation. She presented to the clinic in atrial flutter with a heart rate of 139. She was started on flecainide at her last visit. She has returned to sinus rhythm. She does continue to have shortness of breath. She says she is short of breath when walking upstairs. She is also short of breath with any amount of exertion. She had an exercise treadmill test and was only able to get her heart rate up to 77 with 1:23 on the Bruce protocol.  Today, she denies symptoms of palpitations, chest pain, orthopnea, PND, lower extremity edema, claudication, dizziness, presyncope, syncope, bleeding, or neurologic sequela. The patient is tolerating medications without difficulties. He does say that she has been having episodes of shortness of breath as well as fatigue. She has not been able to do all of her daily  activities due to her fatigue and shortness of breath.   Past Medical History:  Diagnosis Date  . Anemia    borderline  . Atrial fibrillation ([place])   . Cancer [facility])    cervical/rad hysterectomy/bso wiht chemo for small cell ca  . Hypertension    Past Surgical History:  Procedure Laterality Date  . RADICAL ABDOMINAL HYSTERECTOMY  1986   with BSO  . TEE WITHOUT CARDIOVERSION N/A 06/07/2016   Procedure: TRANSESOPHAGEAL ECHOCARDIOGRAM (TEE);  Surgeon: Sanda Klein, MD;  Location: [facility] ENDOSCOPY;  Service: Cardiovascular;  Laterality: N/A;     Current Outpatient Prescriptions  Medication Sig Dispense Refill  . ALPRAZolam (XANAX) 0.5 MG tablet Take 1 tablet (0.5 mg total) by mouth daily as needed. ANXIETY 30 tablet 1  . apixaban (ELIQUIS) 5 MG TABS tablet Take 1 tablet (5 mg total) by mouth 2 (two) times daily. 60 tablet 5  . citalopram (CELEXA) 20 MG tablet Take 1 tablet (20 mg total) by mouth daily. 90 tablet 1  . estradiol (ESTRACE) 0.5 MG tablet Take 1 tablet (0.5 mg total) by mouth daily. 90 tablet 2  . Furosemide (LASIX PO) Take 1 tablet by mouth daily. TAKE 1 TABLET BY MOUTH ONCE DAILY. PATIENT NOT SURE OF DOSEAGE    . Multiple Vitamin (MULTIVITAMIN WITH MINERALS) TABS Take 1 tablet by mouth daily.    . nitrofurantoin, macrocrystal-monohydrate, (MACROBID) 100 MG capsule Take 100 mg by mouth 2 (two) times daily as needed (FOR UTI (URINARY TRACT INFECTION)).      No current facility-administered medications for this  visit.     Allergies:   Patient has no known allergies.   Social History:  The patient  reports that she has never smoked. She has never used smokeless tobacco. She reports that she drinks about 2.4 - 3.0 oz of alcohol per week . She reports that she does not use drugs.   Family History:  The patient's family history includes CVA in her father; Cancer in her paternal grandfather; Diabetes in her mother; Hypertension in her mother; Lung cancer in her mother; Skin  cancer in her father.    ROS:  Please see the history of present illness.   Otherwise, review of systems is positive for SOB.   All other systems are reviewed and negative.    PHYSICAL EXAM: VS:  BP 134/90 (BP Location: Right Arm, Patient Position: Sitting, Cuff Size: Normal)   Pulse (!) 54   Ht 5\' 3"  (1.6 m)   Wt 180 lb 3.2 oz (81.7 kg)   LMP 03/19/1984   SpO2 96%   BMI 31.92 kg/m  , BMI Body mass index is 31.92 kg/m. GEN: Well nourished, well developed, in no acute distress  HEENT: normal  Neck: no JVD, carotid bruits, or masses Cardiac: iRRR; no murmurs, rubs, or gallops,no edema  Respiratory:  clear to auscultation bilaterally, normal work of breathing GI: soft, nontender, nondistended, + BS MS: no deformity or atrophy  Skin: warm and dry Neuro:  Strength and sensation are intact Psych: euthymic mood, full affect  EKG:  EKG is not ordered today. Personal review of the ekg ordered shows sinus rhythm, first-degree AV block, left axis deviation, sinus pauses  Recent Labs: 07/01/2015: Hemoglobin 12.7 12/19/2015: Magnesium 1.7 04/30/2016: BUN 13; Creatinine, Ser 1.02; Platelets 357; Potassium 4.7; Sodium 141    Lipid Panel     Component Value Date/Time   CHOL 213 (H) 06/03/2015 1403   TRIG 107 06/03/2015 1403   HDL 44 (L) 06/03/2015 1403   CHOLHDL 4.8 06/03/2015 1403   VLDL 21 06/03/2015 1403   LDLCALC 148 (H) 06/03/2015 1403     Wt Readings from Last 3 Encounters:  06/18/16 180 lb 3.2 oz (81.7 kg)  05/18/16 178 lb 12.8 oz (81.1 kg)  04/30/16 176 lb 12.8 oz (80.2 kg)      Other studies Reviewed: Additional studies/ records that were reviewed today include: TTE 05/16/16  Review of the above records today demonstrates:  - Left ventricle: The cavity size was normal. Wall thickness was   normal. Systolic function was normal. The estimated ejection   fraction was in the range of 60% to 65%. - Mitral valve: There was moderate to severe regurgitation. - Pulmonary  arteries: Systolic pressure was mildly increased. PA   peak pressure: 37 mm Hg (S). - Pericardium, extracardiac: A trivial pericardial effusion was   identified.  TEE 06/07/16 - Left ventricle: Systolic function was normal. The estimated   ejection fraction was in the range of 55% to 60%. Wall motion was   normal; there were no regional wall motion abnormalities. There   was a reduced contribution of atrial contraction to ventricular   filling, due to increased ventricular diastolic pressure or   atrial contractile dysfunction. Features are consistent with a   pseudonormal left ventricular filling pattern, with concomitant   abnormal relaxation and increased filling pressure (grade 2   diastolic dysfunction). - Aortic valve: No evidence of vegetation. - Mitral valve: Moderately dilated annulus. Structurally normal   valve. There was moderate regurgitation directed centrally. - Left  atrium: The atrium was mildly to moderately dilated. No   evidence of thrombus in the atrial cavity or appendage. - Right atrium: No evidence of thrombus in the atrial cavity or   appendage. No evidence of thrombus in the atrial cavity or   appendage. - Atrial septum: No defect or patent foramen ovale was identified.   There was no right-to-left atrial level shunt, following an   increase in RA pressure induced by provocative maneuvers. - Pulmonic valve: No evidence of vegetation. - Pulmonary arteries: Systolic pressure was mildly increased. PA   peak pressure: 40 mm Hg (S). - Pericardium, extracardiac: A trivial pericardial effusion was   identified.  SPECT 06/15/16  Nuclear stress EF: 58%.  There was no ST segment deviation noted during stress.  The study is normal.  This is a low risk study.  The left ventricular ejection fraction is hyperdynamic (>65%).   Normal pharmacologic nuclear stress test with no evidence of prior infarct or ischemia.   ASSESSMENT AND PLAN:  1.  Atrial  fibrillation/flutter: on eliquis, flecainide, and metoprolol. She is continuing to have symptoms of shortness of breath. This could be due 6 sinus syndrome with an inability to raise her heart rate. Due to that, we'll stop both her flecainide and metoprolol. I have discussed other options with her about 30 for atrial fibrillation including both ablation and multaq. We Rether Rison discuss these options at her next visit. We Chirstine Defrain have her come back in 2 weeks for an EKG.  This patients CHA2DS2-VASc Score and unadjusted Ischemic Stroke Rate (% per year) is equal to 3.2 % stroke rate/year from a score of 3  Above score calculated as 1 point each if present [CHF, HTN, DM, Vascular=MI/PAD/Aortic Plaque, Age if 65-74, or Female] Above score calculated as 2 points each if present [Age > 75, or Stroke/TIA/TE]  2. PVCs: Currently asymptomatic  3. Hypertension: Well-controlled today  4. Moderate mitral regurgitation: TEE showed moderate central regurgitation. Continue current management.    Current medicines are reviewed at length with the patient today.   The patient does not have concerns regarding her medicines.  The following changes were made today:  Stop flecainide and metoprolol  Labs/ tests ordered today include:  Orders Placed This Encounter  Procedures  . EKG 12-Lead     Disposition:   FU with Tinna Kolker 3 months  Signed, Xaiden Fleig Meredith Leeds, MD  06/18/2016 12:20 PM     [place] [address] [place] [address] [phone] (office) [phone] (fax)

## 2016-06-19 DIAGNOSIS — L309 Dermatitis, unspecified: Secondary | ICD-10-CM | POA: Diagnosis not present

## 2016-06-19 DIAGNOSIS — L738 Other specified follicular disorders: Secondary | ICD-10-CM | POA: Diagnosis not present

## 2016-06-28 ENCOUNTER — Encounter: Payer: Self-pay | Admitting: Cardiology

## 2016-06-28 NOTE — Telephone Encounter (Signed)
This encounter was created in error - please disregard.

## 2016-07-03 ENCOUNTER — Ambulatory Visit (INDEPENDENT_AMBULATORY_CARE_PROVIDER_SITE_OTHER): Payer: Medicare Other | Admitting: *Deleted

## 2016-07-03 VITALS — BP 131/89 | HR 76 | Ht 63.0 in | Wt 175.5 lb

## 2016-07-03 DIAGNOSIS — I480 Paroxysmal atrial fibrillation: Secondary | ICD-10-CM | POA: Diagnosis not present

## 2016-07-03 NOTE — Patient Instructions (Signed)
Pt D/C home no c/o offered.

## 2016-07-03 NOTE — Progress Notes (Signed)
1.) Reason for visit: 12 lead EKG   2.) Name of MD requesting visit: Camnitz  3.) H&P: Atrial fibrillation/flutter  4.) ROS related to problem: Pt 's medication Flecainide and Metoprolol was Discontinue. pt has flutter feeling a few times a week.  5.) Assessment and plan per MD: EKG done, read per Dr Lovena Le EP, SR with 1st degree AV block with occasional PVC. Rate 70 beats/minute.

## 2016-07-16 ENCOUNTER — Other Ambulatory Visit: Payer: Self-pay | Admitting: Obstetrics & Gynecology

## 2016-07-16 NOTE — Telephone Encounter (Signed)
Medication refill request: xanax and Macrobid  Last AEX:  06/03/15 SM  Next AEX: 09/20/16 SM Last MMG (if hormonal medication request): 09/26/15 [id number]:neg  Refill authorized: xanax 10/31/15 #[id number]. Takes macrobid post coital   Please advise.

## 2016-07-16 NOTE — Telephone Encounter (Signed)
Patient is requesting refills for Macrobid and Xanax. She is using [facility]. [phone]

## 2016-07-17 MED ORDER — NITROFURANTOIN MONOHYD MACRO 100 MG PO CAPS: 100.0000 mg | ORAL_CAPSULE | Freq: Two times a day (BID) | ORAL | 0 refills | Status: DC | PRN

## 2016-07-17 MED ORDER — ALPRAZOLAM 0.5 MG PO TABS: 0.5000 mg | ORAL_TABLET | Freq: Every day | ORAL | 0 refills | Status: DC | PRN

## 2016-08-30 DIAGNOSIS — L739 Follicular disorder, unspecified: Secondary | ICD-10-CM | POA: Diagnosis not present

## 2016-08-30 DIAGNOSIS — L218 Other seborrheic dermatitis: Secondary | ICD-10-CM | POA: Diagnosis not present

## 2016-08-30 DIAGNOSIS — L820 Inflamed seborrheic keratosis: Secondary | ICD-10-CM | POA: Diagnosis not present

## 2016-09-05 ENCOUNTER — Other Ambulatory Visit: Payer: Self-pay

## 2016-09-05 NOTE — Telephone Encounter (Signed)
Medication refill request: estradiol 0.5mg  Last AEX:  06-03-15 Next AEX: 09-20-16 with Dr [name] Heck Last MMG (if hormonal medication request): 09-26-15 category b density birads 1:neg Refill authorized: refill request came in asking for refill for patient to get her to her aex. Pt has gotten 90 day in the past. Please approve If appropriate.

## 2016-09-05 NOTE — Telephone Encounter (Signed)
Will send the estrogen refill request through to Dr. [name] Heck for her review. Patient is on Eliquis for atrial fibrillation.   Cc - Dr. [name] Heck

## 2016-09-07 MED ORDER — ESTRADIOL 0.5 MG PO TABS: 0.5000 mg | ORAL_TABLET | Freq: Every day | ORAL | 0 refills | Status: DC

## 2016-09-11 ENCOUNTER — Encounter: Payer: Self-pay | Admitting: *Deleted

## 2016-09-11 ENCOUNTER — Ambulatory Visit (INDEPENDENT_AMBULATORY_CARE_PROVIDER_SITE_OTHER): Payer: Medicare Other | Admitting: Cardiology

## 2016-09-11 ENCOUNTER — Encounter: Payer: Self-pay | Admitting: Cardiology

## 2016-09-11 ENCOUNTER — Other Ambulatory Visit: Payer: Self-pay | Admitting: Cardiology

## 2016-09-11 VITALS — BP 138/92 | HR 70 | Ht 63.0 in | Wt 182.6 lb

## 2016-09-11 DIAGNOSIS — I10 Essential (primary) hypertension: Secondary | ICD-10-CM

## 2016-09-11 DIAGNOSIS — I480 Paroxysmal atrial fibrillation: Secondary | ICD-10-CM | POA: Diagnosis not present

## 2016-09-11 DIAGNOSIS — I493 Ventricular premature depolarization: Secondary | ICD-10-CM

## 2016-09-11 DIAGNOSIS — Z01812 Encounter for preprocedural laboratory examination: Secondary | ICD-10-CM | POA: Diagnosis not present

## 2016-09-11 DIAGNOSIS — I340 Nonrheumatic mitral (valve) insufficiency: Secondary | ICD-10-CM

## 2016-09-11 NOTE — Progress Notes (Signed)
Electrophysiology Office Note   Date:  09/11/2016   ID:  PHILLIP SANDLER, DOB May 13, 1950, MRN [id number]  PCP:  Kelton Pillar, MD  Cardiologist:  Marlou Porch Primary Electrophysiologist:  Ravynn Hogate Meredith Leeds, MD    Chief Complaint  Patient presents with  . Follow-up    PAF     History of Present Illness: Savannah Hill is a 66 y.o. female who presents today for electrophysiology evaluation.   She has a history of atrial fibrillation on Eliquis, hypertension, and PVCs. Atrial fibrillation was found in 2013 after hospitalization. At the time she converted to sinus rhythm on diltiazem. She had another bout of atrial fibrillation on 1/16 and converted on diltiazem at that point as well. She was seen 10/17 for palpitations and was found to have symptomatic PVCs. Diltiazem was stopped and she was started on metoprolol 12.5 mg twice a day. She represented to clinic in February with more palpitations. She had documented atrial fibrillation on January 13, January 14, February 4. This was found on her I phone cardia app. She has significant dyspnea on exertion and fatigue from her atrial fibrillation. She presented to the clinic in atrial flutter with a heart rate of 139.   Today, denies symptoms of palpitations, chest pain, shortness of breath, orthopnea, PND, lower extremity edema, claudication, dizziness, presyncope, syncope, bleeding, or neurologic sequela. The patient is tolerating medications without difficulties and is otherwise without complaint today. He has had episodes of atrial fibrillation over the past few months. She was taken off of her flecainide and metoprolol as she was feeling quite a bit of fatigue and shortness of breath. She has a cardia app and has recorded episodes of atrial fibrillation. She knows when she is atrial fibrillation due to fatigue and shortness of breath. She also has palpitations.    Past Medical History:  Diagnosis Date  . Anemia    borderline  .  Atrial fibrillation ([place])   . Cancer [facility])    cervical/rad hysterectomy/bso wiht chemo for small cell ca  . Hypertension    Past Surgical History:  Procedure Laterality Date  . RADICAL ABDOMINAL HYSTERECTOMY  1986   with BSO  . TEE WITHOUT CARDIOVERSION N/A 06/07/2016   Procedure: TRANSESOPHAGEAL ECHOCARDIOGRAM (TEE);  Surgeon: Sanda Klein, MD;  Location: [facility] ENDOSCOPY;  Service: Cardiovascular;  Laterality: N/A;     Current Outpatient Prescriptions  Medication Sig Dispense Refill  . ALPRAZolam (XANAX) 0.5 MG tablet Take 1 tablet (0.5 mg total) by mouth daily as needed. ANXIETY 30 tablet 0  . apixaban (ELIQUIS) 5 MG TABS tablet Take 1 tablet (5 mg total) by mouth 2 (two) times daily. 60 tablet 5  . citalopram (CELEXA) 20 MG tablet Take 1 tablet (20 mg total) by mouth daily. 90 tablet 1  . estradiol (ESTRACE) 0.5 MG tablet Take 1 tablet (0.5 mg total) by mouth daily. 90 tablet 0  . Furosemide (LASIX PO) Take 1 tablet by mouth daily. TAKE 1 TABLET BY MOUTH ONCE DAILY. PATIENT NOT SURE OF DOSEAGE    . Multiple Vitamin (MULTIVITAMIN WITH MINERALS) TABS Take 1 tablet by mouth daily.    . nitrofurantoin, macrocrystal-monohydrate, (MACROBID) 100 MG capsule Take 1 capsule (100 mg total) by mouth 2 (two) times daily as needed (FOR UTI (URINARY TRACT INFECTION)). 30 capsule 0   No current facility-administered medications for this visit.     Allergies:   Patient has no known allergies.   Social History:  The patient  reports that she has never  smoked. She has never used smokeless tobacco. She reports that she drinks about 2.4 - 3.0 oz of alcohol per week . She reports that she does not use drugs.   Family History:  The patient's family history includes CVA in her father; Cancer in her paternal grandfather; Diabetes in her mother; Hypertension in her mother; Lung cancer in her mother; Skin cancer in her father.    ROS:  Please see the history of present illness.   Otherwise, review of  systems is positive for palpitations.   All other systems are reviewed and negative.     PHYSICAL EXAM: VS:  BP (!) 138/92   Pulse 70   Ht 5\' 3"  (1.6 m)   Wt 182 lb 9.6 oz (82.8 kg)   LMP 03/19/1984   BMI 32.35 kg/m  , BMI Body mass index is 32.35 kg/m. GEN: Well nourished, well developed, in no acute distress  HEENT: normal  Neck: no JVD, carotid bruits, or masses Cardiac: RRR; no murmurs, rubs, or gallops,no edema  Respiratory:  clear to auscultation bilaterally, normal work of breathing GI: soft, nontender, nondistended, + BS MS: no deformity or atrophy  Skin: warm and dry Neuro:  Strength and sensation are intact Psych: euthymic mood, full affect  EKG:  EKG is not ordered today. Personal review of the ekg ordered 07/03/16 shows sinus rhythm, 1 degree AV block, PVCs, rate 70, PRWP  Recent Labs: 12/19/2015: Magnesium 1.7 04/30/2016: BUN 13; Creatinine, Ser 1.02; Hemoglobin 13.1; Platelets 357; Potassium 4.7; Sodium 141    Lipid Panel     Component Value Date/Time   CHOL 213 (H) 06/03/2015 1403   TRIG 107 06/03/2015 1403   HDL 44 (L) 06/03/2015 1403   CHOLHDL 4.8 06/03/2015 1403   VLDL 21 06/03/2015 1403   LDLCALC 148 (H) 06/03/2015 1403     Wt Readings from Last 3 Encounters:  09/11/16 182 lb 9.6 oz (82.8 kg)  07/03/16 175 lb 8 oz (79.6 kg)  06/18/16 180 lb 3.2 oz (81.7 kg)      Other studies Reviewed: Additional studies/ records that were reviewed today include: TTE 05/16/16  Review of the above records today demonstrates:  - Left ventricle: The cavity size was normal. Wall thickness was   normal. Systolic function was normal. The estimated ejection   fraction was in the range of 60% to 65%. - Mitral valve: There was moderate to severe regurgitation. - Pulmonary arteries: Systolic pressure was mildly increased. PA   peak pressure: 37 mm Hg (S). - Pericardium, extracardiac: A trivial pericardial effusion was   identified.  TEE 06/07/16 - Left ventricle:  Systolic function was normal. The estimated   ejection fraction was in the range of 55% to 60%. Wall motion was   normal; there were no regional wall motion abnormalities. There   was a reduced contribution of atrial contraction to ventricular   filling, due to increased ventricular diastolic pressure or   atrial contractile dysfunction. Features are consistent with a   pseudonormal left ventricular filling pattern, with concomitant   abnormal relaxation and increased filling pressure (grade 2   diastolic dysfunction). - Aortic valve: No evidence of vegetation. - Mitral valve: Moderately dilated annulus. Structurally normal   valve. There was moderate regurgitation directed centrally. - Left atrium: The atrium was mildly to moderately dilated. No   evidence of thrombus in the atrial cavity or appendage. - Right atrium: No evidence of thrombus in the atrial cavity or   appendage. No evidence of thrombus  in the atrial cavity or   appendage. - Atrial septum: No defect or patent foramen ovale was identified.   There was no right-to-left atrial level shunt, following an   increase in RA pressure induced by provocative maneuvers. - Pulmonic valve: No evidence of vegetation. - Pulmonary arteries: Systolic pressure was mildly increased. PA   peak pressure: 40 mm Hg (S). - Pericardium, extracardiac: A trivial pericardial effusion was   identified.  SPECT 06/15/16  Nuclear stress EF: 58%.  There was no ST segment deviation noted during stress.  The study is normal.  This is a low risk study.  The left ventricular ejection fraction is hyperdynamic (>65%).   Normal pharmacologic nuclear stress test with no evidence of prior infarct or ischemia.   ASSESSMENT AND PLAN:  1.  Paroxysmal atrial fibrillation/flutter: She has had breakthrough atrial fibrillation episodes. She would prefer to have ablation. Risks and benefits of ablation were discussed. Risks include bleeding, tamponade, heart  block, stroke, and damage to surrounding organs. She understands these risks and has agreed to the procedure.  This patients CHA2DS2-VASc Score and unadjusted Ischemic Stroke Rate (% per year) is equal to 3.2 % stroke rate/year from a score of 3  Above score calculated as 1 point each if present [CHF, HTN, DM, Vascular=MI/PAD/Aortic Plaque, Age if 65-74, or Female] Above score calculated as 2 points each if present [Age > 75, or Stroke/TIA/TE]  2. PVCs: Currently asymptomatic  3. Hypertension: Mildly elevated today, but normal at home  4. Moderate mitral regurgitation: No chronic symptoms of shortness of breath. No changes at this time.    Current medicines are reviewed at length with the patient today.   The patient does not have concerns regarding her medicines.  The following changes were made today:  none  Labs/ tests ordered today include:  No orders of the defined types were placed in this encounter.    Disposition:   FU with Naylee Frankowski 4 months  Signed, Mallary Kreger Meredith Leeds, MD  09/11/2016 11:43 AM     [facility] [address] [place]  [address] [phone] (office) [phone] (fax)

## 2016-09-11 NOTE — Patient Instructions (Addendum)
Medication Instructions:    Your physician recommends that you continue on your current medications as directed. Please refer to the Current Medication list given to you today.  - If you need a refill on your cardiac medications before your next appointment, please call your pharmacy.   Labwork:  Your physician recommends that you return for lab work between: 7/6 - 7/12 for: BMET & CBC w/ diff  Testing/Procedures: Your physician has requested that you have cardiac CT - within 7 days prior to your procedure on 10/05/16. Cardiac computed tomography (CT) is a painless test that uses an x-ray machine to take clear, detailed pictures of your heart. For further information please visit [email or web address].   The office will call you to arrange this testing.  Follow-Up:  Your physician recommends that you schedule a follow-up appointment in: 4 weeks, after your procedure on 10/05/16, with Roderic Palau in the Afib clinic.   Your physician recommends that you schedule a follow-up appointment in: 3 months, after your procedure on 10/05/2016, with Dr. Curt Bears.  Thank you for choosing [facility]!!   Trinidad Curet, RN [phone]  Any Other Special Instructions Will Be Listed Below (If Applicable).   Cardiac Ablation Cardiac ablation is a procedure to disable (ablate) a small amount of heart tissue in very specific places. The heart has many electrical connections. Sometimes these connections are abnormal and can cause the heart to beat very fast or irregularly. Ablating some of the problem areas can improve the heart rhythm or return it to normal. Ablation may be done for people who:  Have Wolff-Parkinson-White syndrome.  Have fast heart rhythms (tachycardia).  Have taken medicines for an abnormal heart rhythm (arrhythmia) that were not effective or caused side effects.  Have a high-risk heartbeat that may be life-threatening.  During the procedure, a small incision is made in the  neck or the groin, and a long, thin, flexible tube (catheter) is inserted into the incision and moved to the heart. Small devices (electrodes) on the tip of the catheter will send out electrical currents. A type of X-ray (fluoroscopy) will be used to help guide the catheter and to provide images of the heart. Tell a health care provider about:  Any allergies you have.  All medicines you are taking, including vitamins, herbs, eye drops, creams, and over-the-counter medicines.  Any problems you or family members have had with anesthetic medicines.  Any blood disorders you have.  Any surgeries you have had.  Any medical conditions you have, such as kidney failure.  Whether you are pregnant or may be pregnant. What are the risks? Generally, this is a safe procedure. However, problems may occur, including:  Infection.  Bruising and bleeding at the catheter insertion site.  Bleeding into the chest, especially into the sac that surrounds the heart. This is a serious complication.  Stroke or blood clots.  Damage to other structures or organs.  Allergic reaction to medicines or dyes.  Need for a permanent pacemaker if the normal electrical system is damaged. A pacemaker is a small computer that sends electrical signals to the heart and helps your heart beat normally.  The procedure not being fully effective. This may not be recognized until months later. Repeat ablation procedures are sometimes required.  What happens before the procedure?  Follow instructions from your health care provider about eating or drinking restrictions.  Ask your health care provider about: ? Changing or stopping your regular medicines. This is especially important if you are taking  diabetes medicines or blood thinners. ? Taking medicines such as aspirin and ibuprofen. These medicines can thin your blood. Do not take these medicines before your procedure if your health care provider instructs you not  to.  Plan to have someone take you home from the hospital or clinic.  If you will be going home right after the procedure, plan to have someone with you for 24 hours. What happens during the procedure?  To lower your risk of infection: ? Your health care team will wash or sanitize their hands. ? Your skin will be washed with soap. ? Hair may be removed from the incision area.  An IV tube will be inserted into one of your veins.  You will be given a medicine to help you relax (sedative).  The skin on your neck or groin will be numbed.  An incision will be made in your neck or your groin.  A needle will be inserted through the incision and into a large vein in your neck or groin.  A catheter will be inserted into the needle and moved to your heart.  Dye may be injected through the catheter to help your surgeon see the area of the heart that needs treatment.  Electrical currents will be sent from the catheter to ablate heart tissue in desired areas. There are three types of energy that may be used to ablate heart tissue: ? Heat (radiofrequency energy). ? Laser energy. ? Extreme cold (cryoablation).  When the necessary tissue has been ablated, the catheter will be removed.  Pressure will be held on the catheter insertion area to prevent excessive bleeding.  A bandage (dressing) will be placed over the catheter insertion area. The procedure may vary among health care providers and hospitals. What happens after the procedure?  Your blood pressure, heart rate, breathing rate, and blood oxygen level will be monitored until the medicines you were given have worn off.  Your catheter insertion area will be monitored for bleeding. You will need to lie still for a few hours to ensure that you do not bleed from the catheter insertion area.  Do not drive for 24 hours or as long as directed by your health care provider. Summary  Cardiac ablation is a procedure to disable (ablate) a  small amount of heart tissue in very specific places. Ablating some of the problem areas can improve the heart rhythm or return it to normal.  During the procedure, electrical currents will be sent from the catheter to ablate heart tissue in desired areas. This information is not intended to replace advice given to you by your health care provider. Make sure you discuss any questions you have with your health care provider. Document Released: 07/22/2008 Document Revised: 01/23/2016 Document Reviewed: 01/23/2016 Elsevier Interactive Patient Education  [employer].

## 2016-09-12 DIAGNOSIS — L739 Follicular disorder, unspecified: Secondary | ICD-10-CM | POA: Diagnosis not present

## 2016-09-12 DIAGNOSIS — L738 Other specified follicular disorders: Secondary | ICD-10-CM | POA: Diagnosis not present

## 2016-09-17 ENCOUNTER — Telehealth: Payer: Self-pay | Admitting: Cardiology

## 2016-09-17 NOTE — Telephone Encounter (Signed)
New message      Pt is calling to see if you have her CT scheduled yet ?

## 2016-09-17 NOTE — Telephone Encounter (Signed)
Informed patient that office would be contacting her soon to arrange the pre ablation CT testing.  She understands and thanks me for calling

## 2016-09-18 ENCOUNTER — Encounter: Payer: Self-pay | Admitting: Cardiology

## 2016-09-18 NOTE — Progress Notes (Signed)
66 y.o. G1P1 MarriedCaucasianF here for annual exam.  Having an upcoming trip to see a friend who has recently been diagnosed with stomach and esophageal cancer.  This is a next door neighbor.    Denies vaginal bleeding.  Having increased issues with afib.  Is going in an out of afib now.  Has ablation planned.  Advised her today to stop her estrogen.    Patient's last menstrual period was 03/19/1984.          Sexually active: Yes.    The current method of family planning is status post hysterectomy.    Exercising: No.  The patient does not participate in regular exercise at present. Smoker:  no  Health Maintenance: Pap:  06/03/15 negative, 04/02/14 negative  History of abnormal Pap:  yes MMG:  09/26/15 BIRADS 1 negative  Colonoscopy:  10/16/13 polyp- repeat 7 years  BMD:   09/24/14 normal- repeat 3-5 years  TDaP:  01/2011  Pneumonia vaccine(s):  10/02/11  Zostavax:   Done Hep C testing: 06/03/15 negative  Screening Labs: Will have some done tomorrow for procedure    reports that she has never smoked. She has never used smokeless tobacco. She reports that she drinks about 2.4 - 3.0 oz of alcohol per week . She reports that she does not use drugs.  Past Medical History:  Diagnosis Date  . Anemia    borderline  . Atrial fibrillation ([place])   . Cancer [facility])    cervical/rad hysterectomy/bso wiht chemo for small cell ca  . Hypertension     Past Surgical History:  Procedure Laterality Date  . RADICAL ABDOMINAL HYSTERECTOMY  1986   with BSO  . TEE WITHOUT CARDIOVERSION N/A 06/07/2016   Procedure: TRANSESOPHAGEAL ECHOCARDIOGRAM (TEE);  Surgeon: Sanda Klein, MD;  Location: [facility] ENDOSCOPY;  Service: Cardiovascular;  Laterality: N/A;    Current Outpatient Prescriptions  Medication Sig Dispense Refill  . ALPRAZolam (XANAX) 0.5 MG tablet Take 1 tablet (0.5 mg total) by mouth daily as needed. ANXIETY 30 tablet 0  . apixaban (ELIQUIS) 5 MG TABS tablet Take 1 tablet (5 mg total) by mouth 2  (two) times daily. 60 tablet 5  . citalopram (CELEXA) 20 MG tablet Take 1 tablet (20 mg total) by mouth daily. 90 tablet 1  . doxycycline (VIBRAMYCIN) 100 MG capsule Take 1 capsule by mouth 2 (two) times daily.    [name] Kitchen estradiol (ESTRACE) 0.5 MG tablet Take 1 tablet (0.5 mg total) by mouth daily. 90 tablet 0  . Furosemide (LASIX PO) Take 1 tablet by mouth daily. TAKE 1 TABLET BY MOUTH ONCE DAILY. PATIENT NOT SURE OF DOSEAGE    . Multiple Vitamin (MULTIVITAMIN WITH MINERALS) TABS Take 1 tablet by mouth daily.    . mupirocin ointment (BACTROBAN) 2 %     . nitrofurantoin, macrocrystal-monohydrate, (MACROBID) 100 MG capsule Take 1 capsule (100 mg total) by mouth 2 (two) times daily as needed (FOR UTI (URINARY TRACT INFECTION)). 30 capsule 0   No current facility-administered medications for this visit.     Family History  Problem Relation Age of Onset  . Diabetes Mother   . Hypertension Mother   . Lung cancer Mother   . Skin cancer Father   . CVA Father   . Cancer Paternal Grandfather        unknown type    ROS:  Pertinent items are noted in HPI.  Otherwise, a comprehensive ROS was negative.  Exam:   BP 140/80 (BP Location: Right Arm, Patient Position:  Sitting, Cuff Size: Normal)   Pulse 68   Resp 16   Ht 5' 2.5" (1.588 m)   Wt 181 lb (82.1 kg)   LMP 03/19/1984   BMI 32.58 kg/m   Weight change:  +19#   Height: 5' 2.5" (158.8 cm)  Ht Readings from Last 3 Encounters:  09/20/16 5' 2.5" (1.588 m)  09/11/16 5\' 3"  (1.6 m)  07/03/16 5\' 3"  (1.6 m)    General appearance: alert, cooperative and appears stated age Head: Normocephalic, without obvious abnormality, atraumatic Neck: no adenopathy, supple, symmetrical, trachea midline and thyroid normal to inspection and palpation Lungs: clear to auscultation bilaterally Breasts: normal appearance, no masses or tenderness Heart: regular rate and rhythm Abdomen: soft, non-tender; bowel sounds normal; no masses,  no organomegaly Extremities:  extremities normal, atraumatic, no cyanosis or edema Skin: Skin color, texture, turgor normal. No rashes or lesions Lymph nodes: Cervical, supraclavicular, and axillary nodes normal. No abnormal inguinal nodes palpated Neurologic: Grossly normal   Pelvic: External genitalia:  no lesions              Urethra:  normal appearing urethra with no masses, tenderness or lesions              Bartholins and Skenes: normal                 Vagina: normal appearing vagina with normal color and discharge, no lesions              Cervix: absent              Pap taken: Yes.   Bimanual Exam:  Uterus:  uterus absent              Adnexa: no mass, fullness, tenderness               Rectovaginal: Confirms               Anus:  normal sphincter tone, no lesions  Chaperone was present for exam.  A:  Well Woman with normal exam PMP.  Will stop HRT today Afib, now in and out H/O radical hysterectomy/BSO in 1986 due to cervical cancer H/o anxiety with current stressors with neighbor and death of father  P:   Mammogram guidelines reviewed pap smear obtained today Celexa 20mg  daily.  #90/4RF Xanax 0.5mg  prn.  #30/1RF D/w pt shingles vaccination.   return annually or prn

## 2016-09-20 ENCOUNTER — Other Ambulatory Visit (HOSPITAL_COMMUNITY)
Admission: RE | Admit: 2016-09-20 | Discharge: 2016-09-20 | Disposition: A | Discharge status: Home / Self Care | Payer: Medicare Other | Source: Ambulatory Visit | Attending: Obstetrics & Gynecology | Admitting: Obstetrics & Gynecology

## 2016-09-20 ENCOUNTER — Ambulatory Visit (INDEPENDENT_AMBULATORY_CARE_PROVIDER_SITE_OTHER): Payer: Medicare Other | Admitting: Obstetrics & Gynecology

## 2016-09-20 ENCOUNTER — Encounter: Payer: Self-pay | Admitting: Obstetrics & Gynecology

## 2016-09-20 VITALS — BP 140/80 | HR 68 | Resp 16 | Ht 62.5 in | Wt 181.0 lb

## 2016-09-20 DIAGNOSIS — Z124 Encounter for screening for malignant neoplasm of cervix: Secondary | ICD-10-CM

## 2016-09-20 DIAGNOSIS — C531 Malignant neoplasm of exocervix: Secondary | ICD-10-CM | POA: Diagnosis not present

## 2016-09-20 DIAGNOSIS — Z01419 Encounter for gynecological examination (general) (routine) without abnormal findings: Secondary | ICD-10-CM | POA: Diagnosis not present

## 2016-09-20 MED ORDER — ALPRAZOLAM 0.5 MG PO TABS: 0.5000 mg | ORAL_TABLET | Freq: Every day | ORAL | 1 refills | Status: DC | PRN

## 2016-09-20 MED ORDER — CITALOPRAM HYDROBROMIDE 20 MG PO TABS: 20.0000 mg | ORAL_TABLET | Freq: Every day | ORAL | 4 refills | Status: DC

## 2016-09-20 NOTE — Progress Notes (Signed)
Prescription for xanax 0.5mg , #30, 1RF faxed to [facility]. Fax #: [phone].

## 2016-09-21 ENCOUNTER — Other Ambulatory Visit: Payer: Medicare Other | Admitting: *Deleted

## 2016-09-21 DIAGNOSIS — Z01812 Encounter for preprocedural laboratory examination: Secondary | ICD-10-CM | POA: Diagnosis not present

## 2016-09-21 DIAGNOSIS — I480 Paroxysmal atrial fibrillation: Secondary | ICD-10-CM | POA: Diagnosis not present

## 2016-09-21 LAB — CBC WITH DIFFERENTIAL/PLATELET
BASOS ABS: 0 10*3/uL (ref 0.0–0.2)
Basos: 1 %
EOS (ABSOLUTE): 0.2 10*3/uL (ref 0.0–0.4)
EOS: 3 %
HEMATOCRIT: 37.1 % (ref 34.0–46.6)
HEMOGLOBIN: 12.2 g/dL (ref 11.1–15.9)
IMMATURE GRANS (ABS): 0 10*3/uL (ref 0.0–0.1)
Immature Granulocytes: 0 %
LYMPHS: 34 %
Lymphocytes Absolute: 1.8 10*3/uL (ref 0.7–3.1)
MCH: 31.5 pg (ref 26.6–33.0)
MCHC: 32.9 g/dL (ref 31.5–35.7)
MCV: 96 fL (ref 79–97)
MONOCYTES: 10 %
Monocytes Absolute: 0.5 10*3/uL (ref 0.1–0.9)
Neutrophils Absolute: 2.9 10*3/uL (ref 1.4–7.0)
Neutrophils: 52 %
Platelets: 351 10*3/uL (ref 150–379)
RBC: 3.87 x10E6/uL (ref 3.77–5.28)
RDW: 15.6 % — ABNORMAL HIGH (ref 12.3–15.4)
WBC: 5.4 10*3/uL (ref 3.4–10.8)

## 2016-09-21 LAB — BASIC METABOLIC PANEL
BUN / CREAT RATIO: 24 (ref 12–28)
BUN: 20 mg/dL (ref 8–27)
CALCIUM: 9.3 mg/dL (ref 8.7–10.3)
CHLORIDE: 99 mmol/L (ref 96–106)
CO2: 21 mmol/L (ref 20–29)
CREATININE: 0.84 mg/dL (ref 0.57–1.00)
GFR calc non Af Amer: 73 mL/min/{1.73_m2} (ref >59)
GFR, EST AFRICAN AMERICAN: 84 mL/min/{1.73_m2} (ref >59)
GLUCOSE: 83 mg/dL (ref 65–99)
Potassium: 4.7 mmol/L (ref 3.5–5.2)
Sodium: 138 mmol/L (ref 134–144)

## 2016-09-24 DIAGNOSIS — M7062 Trochanteric bursitis, left hip: Secondary | ICD-10-CM | POA: Diagnosis not present

## 2016-09-24 LAB — CYTOLOGY - PAP: DIAGNOSIS: NEGATIVE

## 2016-09-28 ENCOUNTER — Ambulatory Visit (HOSPITAL_COMMUNITY)
Admission: RE | Admit: 2016-09-28 | Discharge: 2016-09-28 | Disposition: A | Discharge status: Home / Self Care | Payer: Medicare Other | Source: Ambulatory Visit | Attending: Cardiology | Admitting: Cardiology

## 2016-09-28 ENCOUNTER — Other Ambulatory Visit (HOSPITAL_COMMUNITY): Payer: Self-pay | Admitting: Interventional Radiology

## 2016-09-28 ENCOUNTER — Ambulatory Visit (HOSPITAL_COMMUNITY)
Admission: RE | Admit: 2016-09-28 | Discharge: 2016-09-28 | Disposition: A | Discharge status: Home / Self Care | Payer: Medicare Other | Source: Ambulatory Visit | Attending: Interventional Radiology | Admitting: Interventional Radiology

## 2016-09-28 DIAGNOSIS — I4891 Unspecified atrial fibrillation: Secondary | ICD-10-CM

## 2016-09-28 DIAGNOSIS — I878 Other specified disorders of veins: Secondary | ICD-10-CM | POA: Insufficient documentation

## 2016-09-28 DIAGNOSIS — Z789 Other specified health status: Secondary | ICD-10-CM

## 2016-09-28 DIAGNOSIS — I480 Paroxysmal atrial fibrillation: Secondary | ICD-10-CM | POA: Insufficient documentation

## 2016-09-28 DIAGNOSIS — I872 Venous insufficiency (chronic) (peripheral): Secondary | ICD-10-CM | POA: Diagnosis not present

## 2016-09-28 HISTORY — PX: IR US GUIDE VASC ACCESS RIGHT: IMG2390

## 2016-09-28 HISTORY — PX: IR RADIOLOGY PERIPHERAL GUIDED IV START: IMG5598

## 2016-09-28 MED ORDER — LIDOCAINE HCL (PF) 1 % IJ SOLN
INTRAMUSCULAR | Status: AC
  Filled 2016-09-28: qty 5

## 2016-09-28 MED ORDER — IOPAMIDOL (ISOVUE-370) INJECTION 76%
INTRAVENOUS | Status: AC
  Administered 2016-09-28: 100 mL
  Filled 2016-09-28: qty 100

## 2016-10-01 ENCOUNTER — Encounter (HOSPITAL_COMMUNITY): Payer: Self-pay | Admitting: Interventional Radiology

## 2016-10-03 ENCOUNTER — Encounter (HOSPITAL_COMMUNITY): Payer: Self-pay

## 2016-10-03 ENCOUNTER — Other Ambulatory Visit (HOSPITAL_COMMUNITY): Payer: Self-pay | Admitting: Interventional Radiology

## 2016-10-03 DIAGNOSIS — I4891 Unspecified atrial fibrillation: Secondary | ICD-10-CM

## 2016-10-04 ENCOUNTER — Telehealth: Payer: Self-pay | Admitting: Cardiology

## 2016-10-04 DIAGNOSIS — L570 Actinic keratosis: Secondary | ICD-10-CM | POA: Diagnosis not present

## 2016-10-04 DIAGNOSIS — D485 Neoplasm of uncertain behavior of skin: Secondary | ICD-10-CM | POA: Diagnosis not present

## 2016-10-04 DIAGNOSIS — L739 Follicular disorder, unspecified: Secondary | ICD-10-CM | POA: Diagnosis not present

## 2016-10-04 DIAGNOSIS — Z85828 Personal history of other malignant neoplasm of skin: Secondary | ICD-10-CM | POA: Diagnosis not present

## 2016-10-04 DIAGNOSIS — C44519 Basal cell carcinoma of skin of other part of trunk: Secondary | ICD-10-CM | POA: Diagnosis not present

## 2016-10-04 DIAGNOSIS — D225 Melanocytic nevi of trunk: Secondary | ICD-10-CM | POA: Diagnosis not present

## 2016-10-04 DIAGNOSIS — L821 Other seborrheic keratosis: Secondary | ICD-10-CM | POA: Diagnosis not present

## 2016-10-04 NOTE — Telephone Encounter (Signed)
New message      Pt is scheduled to have an ablation tomorrow.  She has questions.  Please call

## 2016-10-04 NOTE — Telephone Encounter (Signed)
Pt asking about activity after ablation Informed that she needs to rest for first week after ablation, no strenuous activity or lifting anything heavier than 10 pounds. She understands she may not drive for a minimum of 5 days and advised to discuss further w/ Dr. Curt Bears tomorrow. Advised to arrive at [facility] hospital at 9:30 a.m. tomorrow morning for her procedure. Patient verbalized understanding and agreeable to plan.

## 2016-10-05 ENCOUNTER — Ambulatory Visit (HOSPITAL_COMMUNITY): Payer: Medicare Other | Admitting: Critical Care Medicine

## 2016-10-05 ENCOUNTER — Encounter (HOSPITAL_COMMUNITY): Payer: Self-pay | Admitting: General Practice

## 2016-10-05 ENCOUNTER — Ambulatory Visit (HOSPITAL_COMMUNITY)
Admission: RE | Admit: 2016-10-05 | Discharge: 2016-10-06 | Disposition: A | Discharge status: Home / Self Care | Payer: Medicare Other | Source: Ambulatory Visit | Attending: Cardiology | Admitting: Cardiology

## 2016-10-05 ENCOUNTER — Encounter (HOSPITAL_COMMUNITY)
Admission: RE | Disposition: A | Discharge status: Home / Self Care | Payer: Self-pay | Source: Ambulatory Visit | Attending: Cardiology

## 2016-10-05 DIAGNOSIS — I480 Paroxysmal atrial fibrillation: Secondary | ICD-10-CM | POA: Diagnosis not present

## 2016-10-05 DIAGNOSIS — I10 Essential (primary) hypertension: Secondary | ICD-10-CM | POA: Diagnosis not present

## 2016-10-05 DIAGNOSIS — I349 Nonrheumatic mitral valve disorder, unspecified: Secondary | ICD-10-CM | POA: Insufficient documentation

## 2016-10-05 DIAGNOSIS — I059 Rheumatic mitral valve disease, unspecified: Secondary | ICD-10-CM | POA: Diagnosis present

## 2016-10-05 DIAGNOSIS — I493 Ventricular premature depolarization: Secondary | ICD-10-CM | POA: Insufficient documentation

## 2016-10-05 DIAGNOSIS — I890 Lymphedema, not elsewhere classified: Secondary | ICD-10-CM | POA: Diagnosis not present

## 2016-10-05 DIAGNOSIS — I4891 Unspecified atrial fibrillation: Secondary | ICD-10-CM | POA: Diagnosis present

## 2016-10-05 DIAGNOSIS — Z7901 Long term (current) use of anticoagulants: Secondary | ICD-10-CM | POA: Diagnosis not present

## 2016-10-05 DIAGNOSIS — R079 Chest pain, unspecified: Secondary | ICD-10-CM | POA: Diagnosis not present

## 2016-10-05 HISTORY — PX: ATRIAL FIBRILLATION ABLATION: EP1191

## 2016-10-05 HISTORY — PX: ABLATION OF DYSRHYTHMIC FOCUS: SHX254

## 2016-10-05 LAB — POCT ACTIVATED CLOTTING TIME
Activated Clotting Time: 318 seconds
Activated Clotting Time: 329 seconds
Activated Clotting Time: 164 seconds

## 2016-10-05 SURGERY — ATRIAL FIBRILLATION ABLATION
Anesthesia: General

## 2016-10-05 MED ORDER — SUGAMMADEX SODIUM 200 MG/2ML IV SOLN
INTRAVENOUS | Status: DC | PRN
  Administered 2016-10-05: 150 mg via INTRAVENOUS

## 2016-10-05 MED ORDER — FENTANYL CITRATE (PF) 100 MCG/2ML IJ SOLN
INTRAMUSCULAR | Status: DC | PRN
  Administered 2016-10-05 (×4): 50 ug via INTRAVENOUS

## 2016-10-05 MED ORDER — SODIUM CHLORIDE 0.9% FLUSH: 3.0000 mL | Freq: Two times a day (BID) | INTRAVENOUS | Status: DC

## 2016-10-05 MED ORDER — POLYVINYL ALCOHOL 1.4 % OP SOLN
1.0000 [drp] | OPHTHALMIC | Status: DC | PRN
  Administered 2016-10-05: 1 [drp] via OPHTHALMIC
  Filled 2016-10-05: qty 15

## 2016-10-05 MED ORDER — BUPIVACAINE HCL (PF) 0.25 % IJ SOLN
INTRAMUSCULAR | Status: AC
  Filled 2016-10-05: qty 30

## 2016-10-05 MED ORDER — ALPRAZOLAM 0.5 MG PO TABS
0.5000 mg | ORAL_TABLET | Freq: Every day | ORAL | Status: DC | PRN
  Administered 2016-10-05: 21:00:00 0.5 mg via ORAL
  Filled 2016-10-05: qty 1

## 2016-10-05 MED ORDER — DOBUTAMINE IN D5W 4-5 MG/ML-% IV SOLN
INTRAVENOUS | Status: DC | PRN
  Administered 2016-10-05: 20 ug/kg/min via INTRAVENOUS

## 2016-10-05 MED ORDER — PROPOFOL 10 MG/ML IV BOLUS
INTRAVENOUS | Status: DC | PRN
  Administered 2016-10-05: 170 mg via INTRAVENOUS

## 2016-10-05 MED ORDER — DOXYCYCLINE HYCLATE 100 MG PO TABS
100.0000 mg | ORAL_TABLET | Freq: Two times a day (BID) | ORAL | Status: DC
  Administered 2016-10-05: 100 mg via ORAL
  Filled 2016-10-05 (×2): qty 1

## 2016-10-05 MED ORDER — DOCUSATE SODIUM 100 MG PO CAPS
100.0000 mg | ORAL_CAPSULE | Freq: Every day | ORAL | Status: DC
  Administered 2016-10-05: 100 mg via ORAL
  Filled 2016-10-05: qty 1

## 2016-10-05 MED ORDER — BUPIVACAINE HCL (PF) 0.25 % IJ SOLN
INTRAMUSCULAR | Status: DC | PRN
  Administered 2016-10-05 (×2): 20 mL

## 2016-10-05 MED ORDER — ADULT MULTIVITAMIN W/MINERALS CH
1.0000 | ORAL_TABLET | Freq: Every day | ORAL | Status: DC
  Filled 2016-10-05: qty 1

## 2016-10-05 MED ORDER — OFF THE BEAT BOOK
Freq: Once | Status: AC
  Administered 2016-10-05: 21:00:00
  Filled 2016-10-05: qty 1

## 2016-10-05 MED ORDER — HEPARIN SODIUM (PORCINE) 1000 UNIT/ML IJ SOLN
INTRAMUSCULAR | Status: AC
  Filled 2016-10-05: qty 1

## 2016-10-05 MED ORDER — PROTAMINE SULFATE 10 MG/ML IV SOLN
INTRAVENOUS | Status: DC | PRN
  Administered 2016-10-05 (×2): 5 mg via INTRAVENOUS
  Administered 2016-10-05 (×2): 15 mg via INTRAVENOUS

## 2016-10-05 MED ORDER — LACTATED RINGERS IV SOLN
INTRAVENOUS | Status: DC | PRN
  Administered 2016-10-05 (×2): via INTRAVENOUS

## 2016-10-05 MED ORDER — ESTRADIOL 1 MG PO TABS: 0.5000 mg | ORAL_TABLET | Freq: Every day | ORAL | Status: DC

## 2016-10-05 MED ORDER — FENTANYL CITRATE (PF) 100 MCG/2ML IJ SOLN: 25.0000 ug | INTRAMUSCULAR | Status: DC | PRN

## 2016-10-05 MED ORDER — APIXABAN 5 MG PO TABS
5.0000 mg | ORAL_TABLET | Freq: Two times a day (BID) | ORAL | Status: DC
  Administered 2016-10-05: 21:00:00 5 mg via ORAL
  Filled 2016-10-05: qty 1

## 2016-10-05 MED ORDER — PHENYLEPHRINE 40 MCG/ML (10ML) SYRINGE FOR IV PUSH (FOR BLOOD PRESSURE SUPPORT)
PREFILLED_SYRINGE | INTRAVENOUS | Status: DC | PRN
  Administered 2016-10-05: 80 ug via INTRAVENOUS

## 2016-10-05 MED ORDER — OXYCODONE HCL 5 MG PO TABS: 5.0000 mg | ORAL_TABLET | Freq: Once | ORAL | Status: DC | PRN

## 2016-10-05 MED ORDER — HEPARIN SODIUM (PORCINE) 1000 UNIT/ML IJ SOLN
INTRAMUSCULAR | Status: DC | PRN
  Administered 2016-10-05: 1000 [IU] via INTRAVENOUS

## 2016-10-05 MED ORDER — SODIUM CHLORIDE 0.9% FLUSH: 3.0000 mL | INTRAVENOUS | Status: DC | PRN

## 2016-10-05 MED ORDER — DOPAMINE-DEXTROSE 3.2-5 MG/ML-% IV SOLN
INTRAVENOUS | Status: AC
  Filled 2016-10-05: qty 250

## 2016-10-05 MED ORDER — FUROSEMIDE 20 MG PO TABS
20.0000 mg | ORAL_TABLET | Freq: Every day | ORAL | Status: DC
  Administered 2016-10-05: 18:00:00 20 mg via ORAL
  Filled 2016-10-05: qty 1

## 2016-10-05 MED ORDER — PHENOL 1.4 % MT LIQD
1.0000 | OROMUCOSAL | Status: DC | PRN
  Administered 2016-10-05 (×2): 1 via OROMUCOSAL
  Filled 2016-10-05: qty 177

## 2016-10-05 MED ORDER — DEXAMETHASONE SODIUM PHOSPHATE 10 MG/ML IJ SOLN
INTRAMUSCULAR | Status: DC | PRN
  Administered 2016-10-05: 8 mg via INTRAVENOUS

## 2016-10-05 MED ORDER — MIDAZOLAM HCL 5 MG/5ML IJ SOLN
INTRAMUSCULAR | Status: DC | PRN
  Administered 2016-10-05: 2 mg via INTRAVENOUS

## 2016-10-05 MED ORDER — HEPARIN (PORCINE) IN NACL 2-0.9 UNIT/ML-% IJ SOLN
INTRAMUSCULAR | Status: AC
  Filled 2016-10-05: qty 500

## 2016-10-05 MED ORDER — ONDANSETRON HCL 4 MG/2ML IJ SOLN: 4.0000 mg | Freq: Four times a day (QID) | INTRAMUSCULAR | Status: DC | PRN

## 2016-10-05 MED ORDER — OXYCODONE HCL 5 MG/5ML PO SOLN: 5.0000 mg | Freq: Once | ORAL | Status: DC | PRN

## 2016-10-05 MED ORDER — SODIUM CHLORIDE 0.9 % IV SOLN: 250.0000 mL | INTRAVENOUS | Status: DC | PRN

## 2016-10-05 MED ORDER — NITROFURANTOIN MONOHYD MACRO 100 MG PO CAPS
100.0000 mg | ORAL_CAPSULE | Freq: Two times a day (BID) | ORAL | Status: DC | PRN
  Filled 2016-10-05: qty 1

## 2016-10-05 MED ORDER — CITALOPRAM HYDROBROMIDE 20 MG PO TABS
20.0000 mg | ORAL_TABLET | Freq: Every day | ORAL | Status: DC
  Administered 2016-10-05: 18:00:00 20 mg via ORAL
  Filled 2016-10-05: qty 1

## 2016-10-05 MED ORDER — ACETAMINOPHEN 325 MG PO TABS: 650.0000 mg | ORAL_TABLET | ORAL | Status: DC | PRN

## 2016-10-05 MED ORDER — ESTRADIOL 1 MG PO TABS
0.5000 mg | ORAL_TABLET | Freq: Every day | ORAL | Status: DC
  Administered 2016-10-05: 18:00:00 0.5 mg via ORAL
  Filled 2016-10-05 (×2): qty 0.5

## 2016-10-05 MED ORDER — LIDOCAINE HCL (CARDIAC) 20 MG/ML IV SOLN
INTRAVENOUS | Status: DC | PRN
  Administered 2016-10-05: 50 mg via INTRAVENOUS

## 2016-10-05 MED ORDER — DOBUTAMINE IN D5W 4-5 MG/ML-% IV SOLN
INTRAVENOUS | Status: AC
  Filled 2016-10-05: qty 250

## 2016-10-05 MED ORDER — HEPARIN SODIUM (PORCINE) 1000 UNIT/ML IJ SOLN
INTRAMUSCULAR | Status: DC | PRN
  Administered 2016-10-05: 14000 [IU] via INTRAVENOUS
  Administered 2016-10-05: 2000 [IU] via INTRAVENOUS

## 2016-10-05 MED ORDER — ONDANSETRON HCL 4 MG/2ML IJ SOLN
INTRAMUSCULAR | Status: DC | PRN
  Administered 2016-10-05: 4 mg via INTRAVENOUS

## 2016-10-05 MED ORDER — HEPARIN (PORCINE) IN NACL 2-0.9 UNIT/ML-% IJ SOLN
INTRAMUSCULAR | Status: AC | PRN
  Administered 2016-10-05: 2000 mL

## 2016-10-05 MED ORDER — ROCURONIUM BROMIDE 100 MG/10ML IV SOLN
INTRAVENOUS | Status: DC | PRN
  Administered 2016-10-05 (×2): 10 mg via INTRAVENOUS
  Administered 2016-10-05: 50 mg via INTRAVENOUS

## 2016-10-05 MED ORDER — PHENYLEPHRINE HCL 10 MG/ML IJ SOLN
INTRAVENOUS | Status: DC | PRN
  Administered 2016-10-05: 50 ug/min via INTRAVENOUS

## 2016-10-05 SURGICAL SUPPLY — 17 items
BAG SNAP BAND KOVER 36X36 (MISCELLANEOUS) ×2 IMPLANT
CATH SMTCH THERMOCOOL SF DF (CATHETERS) ×2 IMPLANT
CATH SOUNDSTAR ECO REPROCESSED (CATHETERS) ×2 IMPLANT
CATH VARIABLE LASSO NAV 2515 (CATHETERS) ×2 IMPLANT
CATH WEBSTER BI DIR CS D-F CRV (CATHETERS) ×2 IMPLANT
COVER SWIFTLINK CONNECTOR (BAG) ×2 IMPLANT
NEEDLE TRANSSEPTAL BRK 98CM (NEEDLE) ×2 IMPLANT
PACK EP LATEX FREE (CUSTOM PROCEDURE TRAY) ×1
PACK EP LF (CUSTOM PROCEDURE TRAY) ×1 IMPLANT
PAD DEFIB LIFELINK (PAD) ×2 IMPLANT
PATCH CARTO3 (PAD) ×2 IMPLANT
SHEATH AGILIS NXT 8.5F 71CM (SHEATH) ×4 IMPLANT
SHEATH AVANTI 11F 11CM (SHEATH) ×2 IMPLANT
SHEATH PINNACLE 7F 10CM (SHEATH) ×2 IMPLANT
SHEATH PINNACLE 8F 10CM (SHEATH) ×4 IMPLANT
SHEATH PINNACLE 9F 10CM (SHEATH) ×4 IMPLANT
TUBING SMART ABLATE COOLFLOW (TUBING) ×2 IMPLANT

## 2016-10-05 NOTE — H&P (Signed)
Savannah Hill is a 66 y.o. female with a history of atrial fibrillation. She presents today for ablation. On exam, iRRR, no murmurs, lungs clear. Risks and benefits of ablation were discussed. Risks include but not limited to bleeding, tamponade, heart block, stroke, damage to surrounding, organs, pulmonary vein stenosis. She understands the risks and has agreed to the procedure.  Will Curt Bears, MD 10/05/2016 11:41 AM

## 2016-10-05 NOTE — Anesthesia Preprocedure Evaluation (Addendum)
Anesthesia Evaluation  Patient identified by MRN, date of birth, ID band Patient awake    Reviewed: Allergy & Precautions, H&P , NPO status , Patient's Chart, lab work & pertinent test results  Airway Mallampati: II   Neck ROM: full    Dental   Pulmonary neg pulmonary ROS,    breath sounds clear to auscultation       Cardiovascular hypertension, + dysrhythmias Atrial Fibrillation  Rhythm:irregular Rate:Normal     Neuro/Psych    GI/Hepatic   Endo/Other    Renal/GU      Musculoskeletal   Abdominal   Peds  Hematology   Anesthesia Other Findings   Reproductive/Obstetrics                             Anesthesia Physical Anesthesia Plan  ASA: III  Anesthesia Plan: General   Post-op Pain Management:    Induction: Intravenous  PONV Risk Score and Plan: 3 and Ondansetron, Dexamethasone, Propofol, Midazolam and Treatment may vary due to age or medical condition  Airway Management Planned: Oral ETT  Additional Equipment:   Intra-op Plan:   Post-operative Plan: Extubation in OR  Informed Consent: I have reviewed the patients History and Physical, chart, labs and discussed the procedure including the risks, benefits and alternatives for the proposed anesthesia with the patient or authorized representative who has indicated his/her understanding and acceptance.     Plan Discussed with: CRNA, Anesthesiologist and Surgeon  Anesthesia Plan Comments:         Anesthesia Quick Evaluation

## 2016-10-05 NOTE — Transfer of Care (Signed)
Immediate Anesthesia Transfer of Care Note  Patient: Savannah Hill  Procedure(s) Performed: Procedure(s): Atrial Fibrillation Ablation (N/A)  Patient Location: Cath Lab  Anesthesia Type:General  Level of Consciousness: awake, alert  and patient cooperative  Airway & Oxygen Therapy: Patient Spontanous Breathing and Patient connected to nasal cannula oxygen  Post-op Assessment: Report given to RN and Post -op Vital signs reviewed and stable  Post vital signs: Reviewed and stable  Last Vitals:  Vitals:   10/05/16 0931  BP: (!) 130/97  Pulse: 83  Resp: 18  Temp: 36.9 C    Last Pain:  Vitals:   10/05/16 1027  TempSrc:   PainSc: 3       Patients Stated Pain Goal: 3 (23/36/12 2449)  Complications: No apparent anesthesia complications

## 2016-10-05 NOTE — Anesthesia Procedure Notes (Signed)
Procedure Name: Intubation Date/Time: 10/05/2016 12:01 PM Performed by: Lavell Luster Pre-anesthesia Checklist: Patient identified, Emergency Drugs available, Suction available, Patient being monitored and Timeout performed Patient Re-evaluated:Patient Re-evaluated prior to induction Oxygen Delivery Method: Circle system utilized Preoxygenation: Pre-oxygenation with 100% oxygen Induction Type: IV induction Ventilation: Mask ventilation without difficulty Laryngoscope Size: Mac and 4 Grade View: Grade I Tube type: Oral Tube size: 7.5 mm Number of attempts: 1 Airway Equipment and Method: Stylet Placement Confirmation: ETT inserted through vocal cords under direct vision,  positive ETCO2 and breath sounds checked- equal and bilateral Secured at: 22 cm Tube secured with: Tape Dental Injury: Teeth and Oropharynx as per pre-operative assessment

## 2016-10-05 NOTE — Progress Notes (Addendum)
Site area: RFV x 2 Site Prior to Removal:  Level 0 Pressure Applied For:25 min Manual:   yes Patient Status During Pull:  stable Post Pull Site:  Level 0 Post Pull Instructions Given: yes  Post Pull Pulses Present: palpable Dressing Applied:  tegaderm Bedrest begins @ 1600 till 2200 Comments:

## 2016-10-05 NOTE — Progress Notes (Addendum)
Site area: LFV x 2 Site Prior to Removal:  Level 0 Pressure Applied For:20 min Manual:  yes  Patient Status During Pull: stable  Post Pull Site:  Level 0 Post Pull Instructions Given: yes  Post Pull Pulses Present: palpable Dressing Applied:  tegaderm Bedrest begins @ 1600 till 2200 Comments:

## 2016-10-06 ENCOUNTER — Encounter (HOSPITAL_COMMUNITY): Payer: Self-pay | Admitting: Student

## 2016-10-06 DIAGNOSIS — I480 Paroxysmal atrial fibrillation: Secondary | ICD-10-CM

## 2016-10-06 DIAGNOSIS — I493 Ventricular premature depolarization: Secondary | ICD-10-CM | POA: Diagnosis not present

## 2016-10-06 DIAGNOSIS — Z7901 Long term (current) use of anticoagulants: Secondary | ICD-10-CM

## 2016-10-06 DIAGNOSIS — I10 Essential (primary) hypertension: Secondary | ICD-10-CM | POA: Diagnosis not present

## 2016-10-06 DIAGNOSIS — I349 Nonrheumatic mitral valve disorder, unspecified: Secondary | ICD-10-CM | POA: Diagnosis not present

## 2016-10-06 NOTE — Discharge Summary (Signed)
Discharge Summary    Patient ID: Savannah Hill,  MRN: [id number], DOB/AGE: 08/14/1950 66 y.o.  Admit date: 10/05/2016 Discharge date: 10/06/2016  Primary Care Provider: Kelton Pillar Primary Cardiologist: Dr. Marlou Porch Primary Electrophysiologist: Dr. Curt Bears  Discharge Diagnoses    Principal Problem:   Paroxysmal atrial fibrillation [facility]) Active Problems:   Mitral valve disease   Current use of long term anticoagulation   History of Present Illness     Savannah Hill is a 66 y.o. female with past medical history of paroxysmal atrial fibrillation (on Eliquis), HTN, moderate MR, and PVC's who presented to [facility] on 10/05/2016 for planned ablation.   She was examined in the office on 09/11/2016 and reported significant fatigue with Flecainide and BB therapy in the past. She had been using the Cardia APP and had documented recurrent episodes of atrial fibrillation with reported fatigue and dyspnea. With her recurrent episodes and medication intolerances, ablation was recommended. The risks and benefits of the procedure were reviewed and she agreed to proceed.    Hospital Course     Consultants: None  She presented to [facility] on 10/05/2016 for the procedure. She was in sinus rhythm upon presentation and underwent successful electrical isolation and anatomical encircling of all four pulmonary veins and radiofrequency current with no inducible arrhythmias following ablation. No complications were noted following the procedure.   The following morning, she denied any recent chest pain, palpitations, or dyspnea. She was maintaining NSR on telemetry and EKG showed no acute changes. Groin sites remained stable. She was last examined by Dr. Curt Bears and deemed stable for discharge.  Follow-up with the Atrial Fibrillation clinic and Dr. Curt Bears has been arranged. He    _____________  Discharge Physical Examination and Vitals Blood pressure 129/72, pulse 77, temperature 98.8  F (37.1 C), temperature source Oral, resp. rate 14, height 5\' 3"  (1.6 m), weight 186 lb 15.2 oz (84.8 kg), last menstrual period 03/19/1984, SpO2 97 %.  Filed Weights   10/05/16 0931 10/06/16 0220  Weight: 178 lb (80.7 kg) 186 lb 15.2 oz (84.8 kg)   General: Pleasant female appearing in NAD Psych: Normal affect. Neuro: Alert and oriented X 3. Moves all extremities spontaneously. HEENT: Normal  Neck: Supple without bruits or JVD. Lungs:  Resp regular and unlabored, CTA without wheezing or rales. Heart: RRR no s3, s4, or murmurs. Abdomen: Soft, non-tender, non-distended, BS + x 4.  Extremities: No clubbing, cyanosis or edema. DP/PT/Radials 2+ and equal bilaterally. Groin stable remain stable without evidence of a hematoma.   Labs & Radiologic Studies     CBC No results for input(s): WBC, NEUTROABS, HGB, HCT, MCV, PLT in the last 72 hours. Basic Metabolic Panel No results for input(s): NA, K, CL, CO2, GLUCOSE, BUN, CREATININE, CALCIUM, MG, PHOS in the last 72 hours. Liver Function Tests No results for input(s): AST, ALT, ALKPHOS, BILITOT, PROT, ALBUMIN in the last 72 hours. No results for input(s): LIPASE, AMYLASE in the last 72 hours. Cardiac Enzymes No results for input(s): CKTOTAL, CKMB, CKMBINDEX, TROPONINI in the last 72 hours. BNP Invalid input(s): POCBNP D-Dimer No results for input(s): DDIMER in the last 72 hours. Hemoglobin A1C No results for input(s): HGBA1C in the last 72 hours. Fasting Lipid Panel No results for input(s): CHOL, HDL, LDLCALC, TRIG, CHOLHDL, LDLDIRECT in the last 72 hours. Thyroid Function Tests No results for input(s): TSH, T4TOTAL, T3FREE, THYROIDAB in the last 72 hours.  Invalid input(s): FREET3  Ir US Guide Vasc Access  Right  Result Date: 10/01/2016 INDICATION: Poor IV access EXAM: VENA PUNCTURE BY MD.  ULTRASOUND GUIDANCE. MEDICATIONS: None ANESTHESIA/SEDATION: None FLUOROSCOPY TIME:  None COMPLICATIONS: None immediate. PROCEDURE: The right arm  was prepped and draped in a sterile fashion. 1% lidocaine was utilized for local infiltration. Under sonographic guidance, a micropuncture needle was inserted into the right basilic vein and removed over a 018 wire. A 5 French transitional dilator was inserted and secured for IV access. FINDINGS: Ultrasound guidance is documented. IMPRESSION: Successful vena puncture by MD for IV access. Electronically Signed   By: Marybelle Killings M.D.   On: 10/01/2016 07:30   Ct Cardiac Morph/pulm Vein W/cm&w/o Ca Score  Addendum Date: 10/04/2016   ADDENDUM REPORT: 10/04/2016 15:26 CLINICAL DATA:  66 year old female with atrial fibrillation scheduled for an ablation. EXAM: Cardiac CT/CTA TECHNIQUE: The patient was scanned on a Siemens Somatom scanner. FINDINGS: A 100 kV prospective scan was triggered in the descending thoracic aorta at 111 HU's. No beta blockade and no NTG was given. The 3D data set was reconstructed in 5% intervals of the 60-80 % of the R-R cycle. Diastolic phases were analyzed on a dedicated work station using MPR, MIP and VRT modes. The patient received 80 cc of contrast. There is normal pulmonary vein drainage into the left atrium (2 on the right and 2 on the left) with ostial measurements as follows: RUPV:  18 x 14 mm RLPV:  18 x 17 mm LUPV:  15 x 11 mm LLPV:  17 x 13 mm The left atrial appendage is large - mixed chicken wing / broccoli type with two lobes and ostial size 33 x 21 mm and length 45 mm. There is no thrombus in the left atrial appendage. The esophagus runs to the left from the left atrial midline and is in the proximity of the LUPV and LLPV. Aorta:  Normal caliber.  No dissection or calcifications. Aortic Valve:  Trileaflet.  No calcifications. Coronary Arteries: Normal coronary origin. Right dominance. The study was performed without use of NTG and insufficient for plaque evaluation. Calcium score 44 that represents 72 percentile age/sex. IMPRESSION: 1. There is normal pulmonary vein drainage  into the left atrium. 2. The left atrial appendage is large - mixed chicken wing / broccoli type with two lobes and ostial size 33 x 21 mm and length 45 mm. There is no thrombus in the left atrial appendage. 3. The esophagus runs to the left from the left atrial midline and is in the proximity of the LUPV and LLPV. 4. Calcium score 44 that represents 72 percentile age/sex. Normal coronary origin. Right dominance. The study was performed without use of NTG and insufficient for plaque evaluation. Ena Dawley Electronically Signed   By: Ena Dawley   On: 10/04/2016 15:26   Result Date: 10/04/2016 EXAM: OVER-READ INTERPRETATION  CT CHEST The following report is an over-read performed by radiologist Dr. Collene Leyden [facility] on 09/28/2016. This over-read does not include interpretation of cardiac or coronary anatomy or pathology. The coronary CTA interpretation by the cardiologist is attached. COMPARISON:  10/01/2011 FINDINGS: Cardiovascular: Heart is borderline in size. Aorta is normal caliber. Mediastinum/Nodes: No mediastinal, hilar, or axillary adenopathy. Trachea and esophagus are unremarkable. Lungs/Pleura: Calcified granulomas in the left lower lobe. No confluent opacity or effusion. Upper Abdomen: Calcifications in the spleen compatible with old granulomatous disease. No acute findings. Musculoskeletal: Chest wall soft tissues are unremarkable. No acute bony abnormality. IMPRESSION: No acute or significant extracardiac abnormality. Electronically  Signed: By: Rolm Baptise M.D. On: 09/28/2016 18:16   Ir Radiology Peripheral Guided Iv Start  Result Date: 10/01/2016 INDICATION: Poor IV access EXAM: VENA PUNCTURE BY MD.  ULTRASOUND GUIDANCE. MEDICATIONS: None ANESTHESIA/SEDATION: None FLUOROSCOPY TIME:  None COMPLICATIONS: None immediate. PROCEDURE: The right arm was prepped and draped in a sterile fashion. 1% lidocaine was utilized for local infiltration. Under sonographic guidance, a  micropuncture needle was inserted into the right basilic vein and removed over a 018 wire. A 5 French transitional dilator was inserted and secured for IV access. FINDINGS: Ultrasound guidance is documented. IMPRESSION: Successful vena puncture by MD for IV access. Electronically Signed   By: Marybelle Killings M.D.   On: 10/01/2016 07:30     Diagnostic Studies/Procedures     Atrial Fibrillation Ablation: 10/05/2016   PREPROCEDURE DIAGNOSES: 1. Paroxysmal atrial fibrillation.  POSTPROCEDURE DIAGNOSES: 1. Paroxysmal  atrial fibrillation.  PROCEDURES: 1. Comprehensive electrophysiologic study. 2. Coronary sinus pacing and recording. 3. Three-dimensional mapping of atrial fibrillation with additional mapping and ablation of a second discrete focus 4. Ablation of atrial fibrillation with additional mapping and ablation of a second discrete focus 5. Intracardiac echocardiography. 6. Transseptal puncture of an intact septum. 7. Arrhythmia induction with pacing with dobutamine infusion  INTRODUCTION:  Savannah Hill is a 66 y.o. female with a history of paroxysmal atrial fibrillation who now presents for EP study and radiofrequency ablation.  The patient reports initially being diagnosed with atrial fibrillation after presenting with symptomatic palpitations and fatgiue. The patient reports increasing frequency and duration of atrial fibrillation since that time.  The patient has failed medical therapy.  The patient therefore presents today for catheter ablation of atrial fibrillation.  DESCRIPTION OF PROCEDURE:  Informed written consent was obtained, and the patient was brought to the electrophysiology lab in a fasting state.  The patient was adequately sedated with intravenous medications as outlined in the anesthesia report.  The patient's left and right groins were prepped and draped in the usual sterile fashion by the EP lab staff.  Using a percutaneous Seldinger technique, two 8-French  hemostasis sheaths were placed in the right femoral vein, and one 7 [place] and one 11-French hemostasis sheaths were placed into the left common femoral vein.  Catheter Placement:  A 7-French Biosense Webster Decapolar coronary sinus catheter was introduced through the right common femoral vein and advanced into the coronary sinus for recording and pacing from this location.    Initial Measurements: The patient presented to the electrophysiology lab in atrial fibrillation. her QRS duration was 104 msec and a QT interval of 392 msec.       Intracardiac Echocardiography: A 10-French Biosense Webster AcuNav intracardiac echocardiography catheter was introduced through the right common femoral vein and advanced into the right atrium. Intracardiac echocardiography was performed of the left atrium, and a three-dimensional anatomical rendering of the left atrium was performed using CARTO sound technology.  The patient was noted to have a moderate sized left atrium.  The interatrial septum was prominent but not aneurysmal. All 4 pulmonary veins were visualized and noted to have separate ostia.  The pulmonary veins were moderate in size.  The left atrial appendage was visualized and did not reveal thrombus.   There was no evidence of pulmonary vein stenosis.   Transseptal Puncture: The right common femoral vein sheaths were exchanged for two 8.5 French Agillis transseptal sheath and transseptal access was achieved in a standard fashion using a Brockenbrough needle under fluoroscopy with intracardiac echocardiography confirmation of  the transseptal puncture.  Once transseptal access had been achieved, heparin was administered intravenously and intra- arterially in order to maintain an ACT of greater than 350 seconds throughout the procedure.   3D Mapping and Ablation: The His bundle catheter was removed and in its place a 3.5 mm [employer] Thermocool ablation catheter was advanced into  the right atrium.  The transseptal sheath was pulled back into the IVC over a guidewire.  The ablation catheter was advanced across the transseptal hole using the wire as a guide.  The transseptal sheath was then re-advanced over the guidewire into the left atrium.  A duodecapolar Biosense Webster circular mapping catheter was introduced through the transseptal sheath and positioned over the mouth of all 4 pulmonary veins.  Three-dimensional electroanatomical mapping was performed using CARTO technology.  This demonstrated electrical activity within all four pulmonary veins at baseline. The patient underwent successful sequential electrical isolation and anatomical encircling of all four pulmonary veins using radiofrequency current with a circular mapping catheter as a guide.  A WACA approach was used.  Entrance and exit block were confirmed. The patient converted to sinus rhythm when ablation posterior to the right upper pulmonary vein.  Measurements Following Ablation: Following ablation, dobutamine was infused up to 20 mcg/kg/min with no inducible atrial fibrillation, atrial tachycardia, atrial flutter, or sustained PACs. In sinus rhythm with RR interval was 658 msec, with PR 184 msec, QRS 99 msec, and Qt 377 msec.  Following ablation the AH interval measured 75 msec with an HV interval of 49 msec. Ventricular pacing was performed, which revealed midline decremental VA conduction with a VA Wenckebach cycle length of 400 msec.  Rapid atrial pacing was performed, which revealed an AV Wenckebach cycle length of 430 msec.  AVNERP 600/400 msec. Electroisolation was then again confirmed in all four pulmonary veins.  Pacing was performed along the ablation line which confirmed entrance and exit block.  The procedure was therefore considered completed.  All catheters were removed, and the sheaths were aspirated and flushed.  The patient was transferred to the recovery area for sheath removal per protocol.  EBL<18ml. Intracardiac echocardiogram revealed a trivial pericardial effusion posterior to the heart.  There were no early apparent complications.  CONCLUSIONS: 1. Sinus rhythm upon presentation.   2. Successful electrical isolation and anatomical encircling of all four pulmonary veins with radiofrequency current. 3. No inducible arrhythmias following ablation both on and off of dobutamine 4. No early apparent complications.     Disposition   Pt is being discharged home today in good condition.  Follow-up Plans & Appointments    Follow-up Information    Constance Haw, MD Follow up on 01/16/2017.   Specialty:  Cardiology Why:  10:30AM Contact information: [address] [place] [address] [phone]        [place] Follow up on 11/06/2016.   Specialty:  Cardiology Why:  9:00AM Contact information: [address] [id number] [place] [phone]         Discharge Instructions    Discharge instructions    Complete by:  As directed    No driving for 4 days. No lifting over 5 lbs for 1 week. No vigorous or sexual activity for 1 week. You may return to work on 10/12/16. Keep procedure site clean & dry. If you notice increased pain, swelling, bleeding or pus, call/return!  You may shower, but no soaking baths/hot tubs/pools for 1 week.  You have an appointment set up with the [facility].  Multiple studies have shown that being followed by a dedicated atrial fibrillation clinic in addition to the standard care you receive from your other physicians improves health. We believe that enrollment in the atrial fibrillation clinic Calvin Jablonowski allow [place] to better care for you.   The phone number to the [facility] is [phone]. The clinic is staffed Monday through Friday from 8:30am to 5pm.  Parking Directions: The clinic is located in the Heart and Vascular Building  connected to [facility]. 1)From [address] turn on to [address] and go to the 3rd entrance  (Heart and Vascular entrance) on the right. 2)Look to the right for Heart &Vascular Parking Garage. 3)The code for the entrance is is 7001 for August.   4)Take the elevators to the 1st floor. Registration is in the room with the glass walls at the end of the hallway.  If you have any trouble parking or locating the clinic, please don't hesitate to call [phone].      Discharge Medications     Medication List    TAKE these medications   ALPRAZolam 0.5 MG tablet Commonly known as:  XANAX Take 1 tablet (0.5 mg total) by mouth daily as needed. ANXIETY What changed:  reasons to take this  additional instructions   apixaban 5 MG Tabs tablet Commonly known as:  ELIQUIS Take 1 tablet (5 mg total) by mouth 2 (two) times daily.   citalopram 20 MG tablet Commonly known as:  CELEXA Take 1 tablet (20 mg total) by mouth daily.   docusate sodium 100 MG capsule Commonly known as:  COLACE Take 100 mg by mouth daily.   doxycycline 100 MG capsule Commonly known as:  VIBRAMYCIN Take 1 capsule by mouth 2 (two) times daily.   estradiol 0.5 MG tablet Commonly known as:  ESTRACE Take 1 tablet (0.5 mg total) by mouth daily.   furosemide 20 MG tablet Commonly known as:  LASIX Take 20 mg by mouth daily.   multivitamin with minerals Tabs tablet Take 1 tablet by mouth daily.   nitrofurantoin (macrocrystal-monohydrate) 100 MG capsule Commonly known as:  MACROBID Take 1 capsule (100 mg total) by mouth 2 (two) times daily as needed (FOR UTI (URINARY TRACT INFECTION)).       Allergies No Known Allergies  Outstanding Labs/Studies   None  Duration of Discharge Encounter   Greater than 30 minutes including physician time.  Signed, Erma Heritage, PA-C 10/06/2016, 8:18 AM  I have seen and examined this patient with [place].  Agree with above, note added  to reflect my findings.  On exam, RRR, no murmurs, lungs clear. Had AF ablation for paroxysmal atrial fibrillation. Converted to sinus rhythm with ablation around the RSPV. Plan for discharge today with follow up in AF clinic in 2 weeks.    Jammal Sarr M. Annmarie Plemmons MD 10/06/2016 8:22 AM

## 2016-10-06 NOTE — Progress Notes (Signed)
Patient requested to take her meds at home, none given this morning.

## 2016-10-06 NOTE — Progress Notes (Signed)
Patient complained of mild discomfort in chest, Dr. Curt Bears into see patient and discussed chest discomfort as a normal post procedure occurrence. Questions answered. Patient ambulated in hall with no adverse symptoms and a steady gait.

## 2016-10-08 ENCOUNTER — Encounter (HOSPITAL_COMMUNITY): Payer: Self-pay | Admitting: Cardiology

## 2016-10-08 MED FILL — Dopamine in Dextrose 5% Inj 3.2 MG/ML: INTRAVENOUS | Qty: 250 | Status: AC

## 2016-10-08 NOTE — Anesthesia Postprocedure Evaluation (Signed)
Anesthesia Post Note  Patient: Savannah Hill  Procedure(s) Performed: Procedure(s) (LRB): Atrial Fibrillation Ablation (N/A)     Patient location during evaluation: PACU Anesthesia Type: General Level of consciousness: awake and alert and patient cooperative Pain management: pain level controlled Vital Signs Assessment: post-procedure vital signs reviewed and stable Respiratory status: spontaneous breathing and respiratory function stable Cardiovascular status: stable Anesthetic complications: no    Last Vitals:  Vitals:   10/06/16 0758 10/06/16 0800  BP: 129/72   Pulse: 78 77  Resp: 14   Temp: 37.1 C     Last Pain:  Vitals:   10/06/16 0758  TempSrc: Oral  PainSc:    Pain Goal: Patients Stated Pain Goal: 3 (10/05/16 1027)               [place]

## 2016-10-24 DIAGNOSIS — L988 Other specified disorders of the skin and subcutaneous tissue: Secondary | ICD-10-CM | POA: Diagnosis not present

## 2016-10-24 DIAGNOSIS — C44519 Basal cell carcinoma of skin of other part of trunk: Secondary | ICD-10-CM | POA: Diagnosis not present

## 2016-11-06 ENCOUNTER — Ambulatory Visit (HOSPITAL_COMMUNITY)
Admission: RE | Admit: 2016-11-06 | Discharge: 2016-11-06 | Disposition: A | Discharge status: Home / Self Care | Payer: Medicare Other | Source: Ambulatory Visit | Attending: Nurse Practitioner | Admitting: Nurse Practitioner

## 2016-11-06 ENCOUNTER — Other Ambulatory Visit: Payer: Self-pay | Admitting: Cardiology

## 2016-11-06 ENCOUNTER — Encounter (HOSPITAL_COMMUNITY): Payer: Self-pay | Admitting: Nurse Practitioner

## 2016-11-06 VITALS — BP 126/86 | HR 79 | Ht 63.0 in | Wt 182.4 lb

## 2016-11-06 DIAGNOSIS — Z833 Family history of diabetes mellitus: Secondary | ICD-10-CM | POA: Diagnosis not present

## 2016-11-06 DIAGNOSIS — Z7902 Long term (current) use of antithrombotics/antiplatelets: Secondary | ICD-10-CM | POA: Insufficient documentation

## 2016-11-06 DIAGNOSIS — Z8544 Personal history of malignant neoplasm of other female genital organs: Secondary | ICD-10-CM | POA: Insufficient documentation

## 2016-11-06 DIAGNOSIS — Z801 Family history of malignant neoplasm of trachea, bronchus and lung: Secondary | ICD-10-CM | POA: Diagnosis not present

## 2016-11-06 DIAGNOSIS — Z9221 Personal history of antineoplastic chemotherapy: Secondary | ICD-10-CM | POA: Diagnosis not present

## 2016-11-06 DIAGNOSIS — Z9889 Other specified postprocedural states: Secondary | ICD-10-CM | POA: Insufficient documentation

## 2016-11-06 DIAGNOSIS — Z808 Family history of malignant neoplasm of other organs or systems: Secondary | ICD-10-CM | POA: Diagnosis not present

## 2016-11-06 DIAGNOSIS — I10 Essential (primary) hypertension: Secondary | ICD-10-CM | POA: Diagnosis not present

## 2016-11-06 DIAGNOSIS — I480 Paroxysmal atrial fibrillation: Secondary | ICD-10-CM

## 2016-11-06 DIAGNOSIS — Z8249 Family history of ischemic heart disease and other diseases of the circulatory system: Secondary | ICD-10-CM | POA: Diagnosis not present

## 2016-11-06 DIAGNOSIS — Z90710 Acquired absence of both cervix and uterus: Secondary | ICD-10-CM | POA: Insufficient documentation

## 2016-11-06 DIAGNOSIS — Z809 Family history of malignant neoplasm, unspecified: Secondary | ICD-10-CM | POA: Diagnosis not present

## 2016-11-06 DIAGNOSIS — Z90722 Acquired absence of ovaries, bilateral: Secondary | ICD-10-CM | POA: Insufficient documentation

## 2016-11-06 DIAGNOSIS — Z823 Family history of stroke: Secondary | ICD-10-CM | POA: Diagnosis not present

## 2016-11-06 NOTE — Telephone Encounter (Signed)
Age 66 years  Wt 82.1kg 09/21/2016 Hgb 12.2 HCT 37.1 09/21/2016 SrCr 0.84 Saw Dr Curt Bears on 09/11/2016  CrCl 86.53 Refill done for Eliquis 5mg  q 12 hours as requested

## 2016-11-06 NOTE — Progress Notes (Signed)
Primary Care Physician: Kelton Pillar, MD Referring Physician:Dr. Janit Pagan Savannah Hill is a 66 y.o. female with a h/o afib that underwent afib ablation 7/20, in the afib clinic for f/u. She does not report any afib since the procedure. No swallowing difficulties or groin issues. Feels she has done very well since procedure. Continues on eliquis for chadsvasc  score of at least 2.  Today, she denies symptoms of palpitations, chest pain, shortness of breath, orthopnea, PND, lower extremity edema, dizziness, presyncope, syncope, or neurologic sequela. The patient is tolerating medications without difficulties and is otherwise without complaint today.   Past Medical History:  Diagnosis Date  . Anemia    borderline  . Atrial fibrillation ([place])    a. s/p ablation on 10/05/2016  . Cancer [facility])    cervical/rad hysterectomy/bso wiht chemo for small cell ca  . Hypertension    Past Surgical History:  Procedure Laterality Date  . ABLATION OF DYSRHYTHMIC FOCUS  10/05/2016  . ATRIAL FIBRILLATION ABLATION N/A 10/05/2016   Procedure: Atrial Fibrillation Ablation;  Surgeon: Constance Haw, MD;  Location: [place] CV LAB;  Service: Cardiovascular;  Laterality: N/A;  . IR RADIOLOGY PERIPHERAL GUIDED IV START  09/28/2016  . IR US GUIDE VASC ACCESS RIGHT  09/28/2016  . RADICAL ABDOMINAL HYSTERECTOMY  1986   with BSO  . TEE WITHOUT CARDIOVERSION N/A 06/07/2016   Procedure: TRANSESOPHAGEAL ECHOCARDIOGRAM (TEE);  Surgeon: Sanda Klein, MD;  Location: [facility] ENDOSCOPY;  Service: Cardiovascular;  Laterality: N/A;    Current Outpatient Prescriptions  Medication Sig Dispense Refill  . ALPRAZolam (XANAX) 0.5 MG tablet Take 1 tablet (0.5 mg total) by mouth daily as needed. ANXIETY (Patient taking differently: Take 0.5 mg by mouth daily as needed for sleep. ANXIETY) 30 tablet 1  . citalopram (CELEXA) 20 MG tablet Take 1 tablet (20 mg total) by mouth daily. 90 tablet 4  . docusate sodium (COLACE)  100 MG capsule Take 100 mg by mouth daily.    [name] Kitchen ELIQUIS 5 MG TABS tablet TAKE 1 TABLET TWICE A DAY 60 tablet 5  . estradiol (ESTRACE) 0.5 MG tablet Take 1 tablet (0.5 mg total) by mouth daily. 90 tablet 0  . furosemide (LASIX) 20 MG tablet Take 20 mg by mouth daily.    . Multiple Vitamin (MULTIVITAMIN WITH MINERALS) TABS Take 1 tablet by mouth daily.    . nitrofurantoin, macrocrystal-monohydrate, (MACROBID) 100 MG capsule Take 1 capsule (100 mg total) by mouth 2 (two) times daily as needed (FOR UTI (URINARY TRACT INFECTION)). 30 capsule 0   No current facility-administered medications for this encounter.     No Known Allergies  Social History   Social History  . Marital status: Married    Spouse name: N/A  . Number of children: N/A  . Years of education: N/A   Occupational History  . Not on file.   Social History Main Topics  . Smoking status: Never Smoker  . Smokeless tobacco: Never Used  . Alcohol use 2.4 - 3.0 oz/week    4 - 5 Standard drinks or equivalent per week  . Drug use: No  . Sexual activity: Yes    Partners: Male    Birth control/ protection: Surgical   Other Topics Concern  . 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  Diabetes Mother   . Hypertension Mother   . Lung cancer Mother   . Skin cancer Father   . CVA Father   . Cancer Paternal Grandfather        unknown type    ROS- All systems are reviewed and negative except as per the HPI above  Physical Exam: Vitals:   11/06/16 0904  BP: 126/86  Pulse: 79  Weight: 182 lb 6.4 oz (82.7 kg)  Height: 5\' 3"  (1.6 m)   Wt Readings from Last 3 Encounters:  11/06/16 182 lb 6.4 oz (82.7 kg)  10/06/16 186 lb 15.2 oz (84.8 kg)  09/20/16 181 lb (82.1 kg)    Labs: Lab Results  Component Value Date   NA 138 09/21/2016   K 4.7 09/21/2016   CL 99 09/21/2016   CO2 21  09/21/2016   GLUCOSE 83 09/21/2016   BUN 20 09/21/2016   CREATININE 0.84 09/21/2016   CALCIUM 9.3 09/21/2016   MG 1.7 12/19/2015   Lab Results  Component Value Date   INR 1.0 04/30/2016   Lab Results  Component Value Date   CHOL 213 (H) 06/03/2015   HDL 44 (L) 06/03/2015   LDLCALC 148 (H) 06/03/2015   TRIG 107 06/03/2015     GEN- The patient is well appearing, alert and oriented x 3 today.   Head- normocephalic, atraumatic Eyes-  Sclera clear, conjunctiva pink Ears- hearing intact Oropharynx- clear Neck- supple, no JVP Lymph- no cervical lymphadenopathy Lungs- Clear to ausculation bilaterally, normal work of breathing Heart- Regular rate and rhythm, no murmurs, rubs or gallops, PMI not laterally displaced GI- soft, NT, ND, + BS Extremities- no clubbing, cyanosis, or edema MS- no significant deformity or atrophy Skin- no rash or lesion Psych- euthymic mood, full affect Neuro- strength and sensation are intact  EKG- NSR at 79 bpm, Pr int 196 ms, qrs int 76 ms, qtc 465 ms Epic records reviewed    Assessment and Plan: 1. Paroxysmal afib S/p ablation and is doing well without any awareness of afib No swallowing or groin issues Continue with eliquis 5 mg bid for a chadsvasc score of at least 2 Regular exercise encouraged  F/u with Dr. Curt Bears  As scheduled 10/31.  Geroge Baseman Yusef Lamp, [facility] [address] [place], [place] [address] [phone]

## 2016-11-25 ENCOUNTER — Other Ambulatory Visit: Payer: Self-pay | Admitting: Obstetrics & Gynecology

## 2016-11-26 NOTE — Telephone Encounter (Signed)
Medication refill request: Estradiol  Last AEX:  09-20-16  Next AEX: 11-04-17  Last MMG (if hormonal medication request): 09-25-16 WNL - called and reminded patient to schedule- patient says she will call to schedule  Refill authorized: please advise

## 2016-11-27 ENCOUNTER — Other Ambulatory Visit: Payer: Self-pay | Admitting: Obstetrics & Gynecology

## 2016-11-27 NOTE — Telephone Encounter (Signed)
Medication refill request: Macrobid  Last AEX:  09-20-16  Next AEX: 11-04-17  Last MMG (if hormonal medication request): 09-26-15 WNL Refill authorized: please advise

## 2016-11-28 DIAGNOSIS — R922 Inconclusive mammogram: Secondary | ICD-10-CM | POA: Diagnosis not present

## 2016-11-28 DIAGNOSIS — Z1231 Encounter for screening mammogram for malignant neoplasm of breast: Secondary | ICD-10-CM | POA: Diagnosis not present

## 2016-11-28 NOTE — Telephone Encounter (Signed)
I advised pt to stop estrogen at her AEX in July due to afib.  She had ablative procedure but is still on eliquis due to increased risks so refill is not done.  THanks.

## 2016-11-29 DIAGNOSIS — R922 Inconclusive mammogram: Secondary | ICD-10-CM | POA: Diagnosis not present

## 2016-12-03 ENCOUNTER — Telehealth: Payer: Self-pay | Admitting: Obstetrics & Gynecology

## 2016-12-03 NOTE — Telephone Encounter (Signed)
[facility]. Patient is requesting refills of her estradiol. Patient is requesting a 90 day supply with 3 refills.

## 2016-12-03 NOTE — Telephone Encounter (Signed)
Called patient and spoke with her regarding message from Dr. [name] Heck. Patient became very upset-  Patient states "I refuse to stop the estrogen, I have been on this for 35 years." I advised that I would give the message to Dr. [name] Heck Please advise     Dr. [name] Heck message-    I advised pt to stop estrogen at her AEX in July due to afib.  She had ablative procedure but is still on eliquis due to increased risks so refill is not done.  THanks.        3:31 PM  Note    I advised pt to stop estrogen at her AEX in July due to afib.  She had ablative procedure but is still on eliquis due to increased risks so refill is not done.  THanks.

## 2016-12-05 DIAGNOSIS — I10 Essential (primary) hypertension: Secondary | ICD-10-CM | POA: Diagnosis not present

## 2016-12-05 DIAGNOSIS — M1990 Unspecified osteoarthritis, unspecified site: Secondary | ICD-10-CM | POA: Diagnosis not present

## 2016-12-05 DIAGNOSIS — K635 Polyp of colon: Secondary | ICD-10-CM | POA: Diagnosis not present

## 2016-12-05 DIAGNOSIS — I890 Lymphedema, not elsewhere classified: Secondary | ICD-10-CM | POA: Diagnosis not present

## 2016-12-05 DIAGNOSIS — F39 Unspecified mood [affective] disorder: Secondary | ICD-10-CM | POA: Diagnosis not present

## 2016-12-05 DIAGNOSIS — I4891 Unspecified atrial fibrillation: Secondary | ICD-10-CM | POA: Diagnosis not present

## 2016-12-05 DIAGNOSIS — Z0000 Encounter for general adult medical examination without abnormal findings: Secondary | ICD-10-CM | POA: Diagnosis not present

## 2016-12-05 DIAGNOSIS — M545 Low back pain: Secondary | ICD-10-CM | POA: Diagnosis not present

## 2016-12-05 DIAGNOSIS — Z1389 Encounter for screening for other disorder: Secondary | ICD-10-CM | POA: Diagnosis not present

## 2016-12-05 DIAGNOSIS — Z23 Encounter for immunization: Secondary | ICD-10-CM | POA: Diagnosis not present

## 2016-12-05 DIAGNOSIS — K219 Gastro-esophageal reflux disease without esophagitis: Secondary | ICD-10-CM | POA: Diagnosis not present

## 2016-12-05 DIAGNOSIS — Z1159 Encounter for screening for other viral diseases: Secondary | ICD-10-CM | POA: Diagnosis not present

## 2016-12-07 ENCOUNTER — Telehealth: Payer: Self-pay | Admitting: Cardiology

## 2016-12-07 ENCOUNTER — Telehealth: Payer: Self-pay | Admitting: Obstetrics & Gynecology

## 2016-12-07 NOTE — Telephone Encounter (Signed)
Returned call to patient she stated her GYN Dr.will not refill Estrace because she is taking Eliquis.Stated she was told not safe to take together might cause a blood clot.Advised I will send message to our pharmacist for advice.

## 2016-12-07 NOTE — Telephone Encounter (Signed)
Returned call to patient Savannah Hill's recommendations given.

## 2016-12-07 NOTE — Telephone Encounter (Signed)
Spoke with patient. Patient states that she contacted her Cardiology office for recommendations regarding taking Eliquis and Estradiol. States the nurse spoke with the pharmacist and advised the patient that it is safe to take Eliquis and Estradiol together. "She said the risk for clotting is when you are taking Estradiol alone." Advised patient while Eliquis is a blood thinner it is not advised to continue taking Estradiol while on this medication. Taking Estradiol counteracts the benefits she is getting from taking Eliquis with the risk of developing a clot. Patient states she has been on Estradiol for 35 years and "does not want to give it up." Does not understand why this cannot be done. States was advised to stop taking Estradiol for 1 week before ablation. Did and hot flashes were "terrible." Told the surgery center she had stopped this for surgery and "they told me they have never heard of that needing to be done before." States rx was refilled earlier this year for one month to give her time to get a mammogram and then next refill was denied. "She needs to understand that I am not going to stop taking it. I will go to another doctor to get it." Advised will review with Dr.Miller and return call.

## 2016-12-07 NOTE — Telephone Encounter (Signed)
Her Estrace increases the risk of venous thromboembolism while her Eliquis thins her blood to prevent her from developing a clot since she has afib.  The combination of medications is not increasing her risk for causing a blood clot, just her Estrace is. Her gynecologist should be the one to determine whether or not the benefits outweigh the risks of her needing Estrace.

## 2016-12-07 NOTE — Telephone Encounter (Signed)
Patient recently spoke with Dr [name] Heck about Estrace.  States she spoke to her Cardiologist and his nurse called and spoke to a [facility] pharmacist.  Patient says they say there is no problem mixing Eliquis and Estrace.  She is not sure why it is a problem for Dr [name] Heck to prescribe her. Would like to speak with a nurse about it.

## 2016-12-07 NOTE — Telephone Encounter (Signed)
Savannah Hill is calling about her medication ( Estrace 05mg  ) needs some Clarification . Please call

## 2016-12-10 NOTE — Telephone Encounter (Signed)
Spoke with pt on Friday, September 21st and advised that her cardiologist advised it was ultimately my decision about her HRT.  As she is on Eliquis after her ablation for afib, stroke risk is still elevated.  I do think she needs to be off estrogen.  Even if she is willing to accept the risks of stroke, I am not.  She advises she will likely seek care elsewhere and I understand this decision.  Conversation was cordial and patient appreciative of call and my explanation.  Ok to close encounter.

## 2016-12-24 ENCOUNTER — Encounter: Payer: Self-pay | Admitting: Obstetrics & Gynecology

## 2017-01-16 ENCOUNTER — Encounter: Payer: Self-pay | Admitting: Cardiology

## 2017-01-16 ENCOUNTER — Ambulatory Visit (INDEPENDENT_AMBULATORY_CARE_PROVIDER_SITE_OTHER): Payer: Medicare Other | Admitting: Cardiology

## 2017-01-16 VITALS — BP 126/88 | HR 75 | Ht 63.0 in | Wt 184.2 lb

## 2017-01-16 DIAGNOSIS — I493 Ventricular premature depolarization: Secondary | ICD-10-CM

## 2017-01-16 DIAGNOSIS — I480 Paroxysmal atrial fibrillation: Secondary | ICD-10-CM | POA: Diagnosis not present

## 2017-01-16 DIAGNOSIS — I10 Essential (primary) hypertension: Secondary | ICD-10-CM | POA: Diagnosis not present

## 2017-01-16 DIAGNOSIS — I340 Nonrheumatic mitral (valve) insufficiency: Secondary | ICD-10-CM | POA: Diagnosis not present

## 2017-01-16 NOTE — Progress Notes (Signed)
Electrophysiology Office Note   Date:  01/16/2017   ID:  Savannah Hill, DOB 1950-04-02, MRN [id number]  PCP:  Kelton Pillar, MD  Cardiologist:  Marlou Porch Primary Electrophysiologist:  Allissa Albright Meredith Leeds, MD    Chief Complaint  Patient presents with  . Follow-up    PAF     History of Present Illness: Savannah Hill is a 66 y.o. female who presents today for electrophysiology evaluation.   She has a history of atrial fibrillation on Eliquis, hypertension, and PVCs. Atrial fibrillation was found in 2013 after hospitalization. At the time she converted to sinus rhythm on diltiazem. She had another bout of atrial fibrillation on 1/16 and converted on diltiazem at that point as well. She was seen 10/17 for palpitations and was found to have symptomatic PVCs. Diltiazem was stopped and she was started on metoprolol 12.5 mg twice a day. She represented to clinic in February with more palpitations. She had documented atrial fibrillation on January 13, January 14, February 4. This was found on her I phone cardia app. She has significant dyspnea on exertion and fatigue from her atrial fibrillation. She presented to the clinic in atrial flutter with a heart rate of 139.  Had atrial fibrillation ablation on 10/05/16.  Today, denies symptoms of palpitations, chest pain, shortness of breath, orthopnea, PND, lower extremity edema, claudication, dizziness, presyncope, syncope, bleeding, or neurologic sequela. The patient is tolerating medications without difficulties.  She has had no further episodes of atrial fibrillation to her knowledge.  She did go to her GYN who has since taken her off of her estrogen due to an interaction with Eliquis.    Past Medical History:  Diagnosis Date  . Anemia    borderline  . Atrial fibrillation ([place])    a. s/p ablation on 10/05/2016  . Cancer [facility])    cervical/rad hysterectomy/bso wiht chemo for small cell ca  . Hypertension    Past Surgical History:    Procedure Laterality Date  . ABLATION OF DYSRHYTHMIC FOCUS  10/05/2016  . ATRIAL FIBRILLATION ABLATION N/A 10/05/2016   Procedure: Atrial Fibrillation Ablation;  Surgeon: Constance Haw, MD;  Location: [place] CV LAB;  Service: Cardiovascular;  Laterality: N/A;  . IR RADIOLOGY PERIPHERAL GUIDED IV START  09/28/2016  . IR US GUIDE VASC ACCESS RIGHT  09/28/2016  . RADICAL ABDOMINAL HYSTERECTOMY  1986   with BSO  . TEE WITHOUT CARDIOVERSION N/A 06/07/2016   Procedure: TRANSESOPHAGEAL ECHOCARDIOGRAM (TEE);  Surgeon: Sanda Klein, MD;  Location: [facility] ENDOSCOPY;  Service: Cardiovascular;  Laterality: N/A;     Current Outpatient Prescriptions  Medication Sig Dispense Refill  . ALPRAZolam (XANAX) 0.5 MG tablet Take 1 tablet (0.5 mg total) by mouth daily as needed. ANXIETY (Patient taking differently: Take 0.5 mg by mouth daily as needed for sleep. ANXIETY) 30 tablet 1  . citalopram (CELEXA) 20 MG tablet Take 1 tablet (20 mg total) by mouth daily. 90 tablet 4  . docusate sodium (COLACE) 100 MG capsule Take 100 mg by mouth daily.    [name] Kitchen ELIQUIS 5 MG TABS tablet TAKE 1 TABLET TWICE A DAY 60 tablet 5  . estradiol (ESTRACE) 0.5 MG tablet Take 1 tablet (0.5 mg total) by mouth daily. 90 tablet 0  . furosemide (LASIX) 20 MG tablet Take 20 mg by mouth daily.    [name] Kitchen gabapentin (NEURONTIN) 100 MG capsule Take as directed    . Multiple Vitamin (MULTIVITAMIN WITH MINERALS) TABS Take 1 tablet by mouth daily.    [name] Kitchen  nitrofurantoin, macrocrystal-monohydrate, (MACROBID) 100 MG capsule TAKE 1 CAPSULE TWICE DAILY AS NEEDED FOR URINARY TRACTINFECTION 30 capsule 0   No current facility-administered medications for this visit.     Allergies:   Patient has no known allergies.   Social History:  The patient  reports that she has never smoked. She has never used smokeless tobacco. She reports that she drinks about 2.4 - 3.0 oz of alcohol per week . She reports that she does not use drugs.   Family History:  The  patient's family history includes CVA in her father; Cancer in her paternal grandfather; Diabetes in her mother; Hypertension in her mother; Lung cancer in her mother; Skin cancer in her father.    ROS:  Please see the history of present illness.   Otherwise, review of systems is positive for none.   All other systems are reviewed and negative.   PHYSICAL EXAM: VS:  BP 126/88   Pulse 75   Ht 5\' 3"  (1.6 m)   Wt 184 lb 3.2 oz (83.6 kg)   LMP 03/19/1984   BMI 32.63 kg/m  , BMI Body mass index is 32.63 kg/m. GEN: Well nourished, well developed, in no acute distress  HEENT: normal  Neck: no JVD, carotid bruits, or masses Cardiac: RRR; no murmurs, rubs, or gallops,no edema  Respiratory:  clear to auscultation bilaterally, normal work of breathing GI: soft, nontender, nondistended, + BS MS: no deformity or atrophy  Skin: warm and dry Neuro:  Strength and sensation are intact Psych: euthymic mood, full affect  EKG:  EKG is ordered today. Personal review of the ekg ordered shows sinus rhythm, 1 degree AV block, rate 75  Recent Labs: 09/21/2016: BUN 20; Creatinine, Ser 0.84; Hemoglobin 12.2; Platelets 351; Potassium 4.7; Sodium 138    Lipid Panel     Component Value Date/Time   CHOL 213 (H) 06/03/2015 1403   TRIG 107 06/03/2015 1403   HDL 44 (L) 06/03/2015 1403   CHOLHDL 4.8 06/03/2015 1403   VLDL 21 06/03/2015 1403   LDLCALC 148 (H) 06/03/2015 1403     Wt Readings from Last 3 Encounters:  01/16/17 184 lb 3.2 oz (83.6 kg)  11/06/16 182 lb 6.4 oz (82.7 kg)  10/06/16 186 lb 15.2 oz (84.8 kg)      Other studies Reviewed: Additional studies/ records that were reviewed today include: TTE 05/16/16  Review of the above records today demonstrates:  - Left ventricle: The cavity size was normal. Wall thickness was   normal. Systolic function was normal. The estimated ejection   fraction was in the range of 60% to 65%. - Mitral valve: There was moderate to severe regurgitation. -  Pulmonary arteries: Systolic pressure was mildly increased. PA   peak pressure: 37 mm Hg (S). - Pericardium, extracardiac: A trivial pericardial effusion was   identified.  TEE 06/07/16 - Left ventricle: Systolic function was normal. The estimated   ejection fraction was in the range of 55% to 60%. Wall motion was   normal; there were no regional wall motion abnormalities. There   was a reduced contribution of atrial contraction to ventricular   filling, due to increased ventricular diastolic pressure or   atrial contractile dysfunction. Features are consistent with a   pseudonormal left ventricular filling pattern, with concomitant   abnormal relaxation and increased filling pressure (grade 2   diastolic dysfunction). - Aortic valve: No evidence of vegetation. - Mitral valve: Moderately dilated annulus. Structurally normal   valve. There was moderate regurgitation  directed centrally. - Left atrium: The atrium was mildly to moderately dilated. No   evidence of thrombus in the atrial cavity or appendage. - Right atrium: No evidence of thrombus in the atrial cavity or   appendage. No evidence of thrombus in the atrial cavity or   appendage. - Atrial septum: No defect or patent foramen ovale was identified.   There was no right-to-left atrial level shunt, following an   increase in RA pressure induced by provocative maneuvers. - Pulmonic valve: No evidence of vegetation. - Pulmonary arteries: Systolic pressure was mildly increased. PA   peak pressure: 40 mm Hg (S). - Pericardium, extracardiac: A trivial pericardial effusion was   identified.  SPECT 06/15/16  Nuclear stress EF: 58%.  There was no ST segment deviation noted during stress.  The study is normal.  This is a low risk study.  The left ventricular ejection fraction is hyperdynamic (>65%).   Normal pharmacologic nuclear stress test with no evidence of prior infarct or ischemia.   ASSESSMENT AND PLAN:  1.  Paroxysmal  atrial fibrillation/flutter: Eliquis.  Is status post atrial fibrillation ablation on 10/05/16.  There are episodes of atrial fibrillation.  Current management.  Of note, I discussed with our pharmacist about the interaction between her estrogen therapy per pharmacy, there is no worrisome interaction and she would be able to be on both of these.  I Braxston Quinter let further decision making on this occur between her primary physician and her GYN.  This patients CHA2DS2-VASc Score and unadjusted Ischemic Stroke Rate (% per year) is equal to 3.2 % stroke rate/year from a score of 3  Above score calculated as 1 point each if present [CHF, HTN, DM, Vascular=MI/PAD/Aortic Plaque, Age if 65-74, or Female] Above score calculated as 2 points each if present [Age > 75, or Stroke/TIA/TE]  2. PVCs: Currently asymptomatic.  3. Hypertension: Well-controlled today.  4. Moderate mitral regurgitation: Symptoms of shortness of breath.  No changes at this time.    Current medicines are reviewed at length with the patient today.   The patient does not have concerns regarding her medicines.  The following changes were made today:  none  Labs/ tests ordered today include:  Orders Placed This Encounter  Procedures  . EKG 12-Lead     Disposition:   FU with Savannah Hill 3 months  Signed, Coralynn Gaona Meredith Leeds, MD  01/16/2017 4:32 PM     [place] [address] [phone] (office) [phone] (fax)

## 2017-01-16 NOTE — Patient Instructions (Signed)
Medication Instructions:    Your physician recommends that you continue on your current medications as directed. Please refer to the Current Medication list given to you today.  - If you need a refill on your cardiac medications before your next appointment, please call your pharmacy.   Labwork:  None ordered  Testing/Procedures:  None ordered  Follow-Up:  Your physician recommends that you schedule a follow-up appointment in: 3 months with Dr. Camnitz.  Thank you for choosing [facility]!!   Rei Medlen, RN [phone]         

## 2017-03-01 ENCOUNTER — Other Ambulatory Visit: Payer: Self-pay | Admitting: Obstetrics & Gynecology

## 2017-03-01 NOTE — Telephone Encounter (Signed)
Medication refill request: Xanax Last AEX:  09-20-16  Next AEX: 11-04-17  Last MMG (if hormonal medication request): 11-29-16 WNL  Refill authorized: please advise

## 2017-03-06 DIAGNOSIS — J069 Acute upper respiratory infection, unspecified: Secondary | ICD-10-CM | POA: Diagnosis not present

## 2017-03-14 DIAGNOSIS — J45909 Unspecified asthma, uncomplicated: Secondary | ICD-10-CM | POA: Diagnosis not present

## 2017-04-15 ENCOUNTER — Other Ambulatory Visit: Payer: Self-pay | Admitting: Obstetrics & Gynecology

## 2017-04-15 NOTE — Telephone Encounter (Signed)
Medication refill request: macrobid Last AEX:  09/20/16 SM  Next AEX: 11/04/17  Last MMG (if hormonal medication request): 11/29/16 BIRADS 2 benign  Refill authorized: 11/28/16 #30, 0RF. Today, please advise.

## 2017-04-18 ENCOUNTER — Encounter: Payer: Self-pay | Admitting: Cardiology

## 2017-04-18 ENCOUNTER — Ambulatory Visit (INDEPENDENT_AMBULATORY_CARE_PROVIDER_SITE_OTHER): Payer: Medicare Other | Admitting: Cardiology

## 2017-04-18 VITALS — BP 112/86 | HR 86 | Ht 63.0 in | Wt 180.0 lb

## 2017-04-18 DIAGNOSIS — I480 Paroxysmal atrial fibrillation: Secondary | ICD-10-CM | POA: Diagnosis not present

## 2017-04-18 DIAGNOSIS — I10 Essential (primary) hypertension: Secondary | ICD-10-CM | POA: Diagnosis not present

## 2017-04-18 DIAGNOSIS — I340 Nonrheumatic mitral (valve) insufficiency: Secondary | ICD-10-CM | POA: Diagnosis not present

## 2017-04-18 DIAGNOSIS — I493 Ventricular premature depolarization: Secondary | ICD-10-CM | POA: Diagnosis not present

## 2017-04-18 NOTE — Patient Instructions (Signed)
Medication Instructions:  Your physician recommends that you continue on your current medications as directed. Please refer to the Current Medication list given to you today.  * If you need a refill on your cardiac medications before your next appointment, please call your pharmacy.   Labwork: None ordered  Testing/Procedures: None ordered  Follow-Up: Your physician wants you to follow-up in: 1 year with Dr. Camnitz.  You will receive a reminder letter in the mail two months in advance. If you don't receive a letter, please call our office to schedule the follow-up appointment.  Thank you for choosing [facility]!!   Dasan Hardman, RN [phone]        

## 2017-04-18 NOTE — Progress Notes (Signed)
Electrophysiology Office Note   Date:  04/18/2017   ID:  Savannah Hill, DOB 12/29/1950, MRN [id number]  PCP:  Kelton Pillar, MD  Cardiologist:  Marlou Porch Primary Electrophysiologist:  Ram Haugan Meredith Leeds, MD    Chief Complaint  Patient presents with  . Follow-up    PAF     History of Present Illness: Savannah Hill is a 67 y.o. female who presents today for electrophysiology evaluation.   She has a history of atrial fibrillation on Eliquis, hypertension, and PVCs. Atrial fibrillation was found in 2013 after hospitalization.  She had an atrial fibrillation ablation 10/05/16.  Today, denies symptoms of palpitations, chest pain, shortness of breath, orthopnea, PND, lower extremity edema, claudication, dizziness, presyncope, syncope, bleeding, or neurologic sequela. The patient is tolerating medications without difficulties.  She has not had issues since her ablation.  She has no major complaints today.  She continues to exercise, trying to get healthier and lose weight.  Past Medical History:  Diagnosis Date  . Anemia    borderline  . Atrial fibrillation ([place])    a. s/p ablation on 10/05/2016  . Cancer [facility])    cervical/rad hysterectomy/bso wiht chemo for small cell ca  . Hypertension    Past Surgical History:  Procedure Laterality Date  . ABLATION OF DYSRHYTHMIC FOCUS  10/05/2016  . ATRIAL FIBRILLATION ABLATION N/A 10/05/2016   Procedure: Atrial Fibrillation Ablation;  Surgeon: Constance Haw, MD;  Location: [place] CV LAB;  Service: Cardiovascular;  Laterality: N/A;  . IR RADIOLOGY PERIPHERAL GUIDED IV START  09/28/2016  . IR US GUIDE VASC ACCESS RIGHT  09/28/2016  . RADICAL ABDOMINAL HYSTERECTOMY  1986   with BSO  . TEE WITHOUT CARDIOVERSION N/A 06/07/2016   Procedure: TRANSESOPHAGEAL ECHOCARDIOGRAM (TEE);  Surgeon: Sanda Klein, MD;  Location: [facility] ENDOSCOPY;  Service: Cardiovascular;  Laterality: N/A;     Current Outpatient Medications  Medication Sig  Dispense Refill  . ALPRAZolam (XANAX) 0.5 MG tablet TAKE 1 TABLET DAILY AS     NEEDED (ANXIETY) 30 tablet 0  . citalopram (CELEXA) 20 MG tablet Take 1 tablet (20 mg total) by mouth daily. 90 tablet 4  . docusate sodium (COLACE) 100 MG capsule Take 100 mg by mouth daily.    [name] Kitchen ELIQUIS 5 MG TABS tablet TAKE 1 TABLET TWICE A DAY 60 tablet 5  . furosemide (LASIX) 20 MG tablet Take 20 mg by mouth daily.    . Multiple Vitamin (MULTIVITAMIN WITH MINERALS) TABS Take 1 tablet by mouth daily.    . nitrofurantoin, macrocrystal-monohydrate, (MACROBID) 100 MG capsule TAKE 1 CAPSULE TWICE DAILY AS NEEDED FOR URINARY TRACTINFECTION 30 capsule 0   No current facility-administered medications for this visit.     Allergies:   Patient has no known allergies.   Social History:  The patient  reports that  has never smoked. she has never used smokeless tobacco. She reports that she drinks about 2.4 - 3.0 oz of alcohol per week. She reports that she does not use drugs.   Family History:  The patient's family history includes CVA in her father; Cancer in her paternal grandfather; Diabetes in her mother; Hypertension in her mother; Lung cancer in her mother; Skin cancer in her father.   ROS:  Please see the history of present illness.   Otherwise, review of systems is positive for none.   All other systems are reviewed and negative.   PHYSICAL EXAM: VS:  BP 112/86   Pulse 86   Ht 5'  3" (1.6 m)   Wt 180 lb (81.6 kg)   LMP 03/19/1984   BMI 31.89 kg/m  , BMI Body mass index is 31.89 kg/m. GEN: Well nourished, well developed, in no acute distress  HEENT: normal  Neck: no JVD, carotid bruits, or masses Cardiac: RRR; no murmurs, rubs, or gallops,no edema  Respiratory:  clear to auscultation bilaterally, normal work of breathing GI: soft, nontender, nondistended, + BS MS: no deformity or atrophy  Skin: warm and dry Neuro:  Strength and sensation are intact Psych: euthymic mood, full affect  EKG:  EKG is not  ordered today. Personal review of the ekg ordered 01/16/17 shows sinus rhythm, 1dAVB, rate 75   Recent Labs: 09/21/2016: BUN 20; Creatinine, Ser 0.84; Hemoglobin 12.2; Platelets 351; Potassium 4.7; Sodium 138    Lipid Panel     Component Value Date/Time   CHOL 213 (H) 06/03/2015 1403   TRIG 107 06/03/2015 1403   HDL 44 (L) 06/03/2015 1403   CHOLHDL 4.8 06/03/2015 1403   VLDL 21 06/03/2015 1403   LDLCALC 148 (H) 06/03/2015 1403     Wt Readings from Last 3 Encounters:  04/18/17 180 lb (81.6 kg)  01/16/17 184 lb 3.2 oz (83.6 kg)  11/06/16 182 lb 6.4 oz (82.7 kg)      Other studies Reviewed: Additional studies/ records that were reviewed today include: TTE3/22/18 Review of the above records today demonstrates:   - Left ventricle: Systolic function was normal. The estimated   ejection fraction was in the range of 55% to 60%. Wall motion was   normal; there were no regional wall motion abnormalities. There   was a reduced contribution of atrial contraction to ventricular   filling, due to increased ventricular diastolic pressure or   atrial contractile dysfunction. Features are consistent with a   pseudonormal left ventricular filling pattern, with concomitant   abnormal relaxation and increased filling pressure (grade 2   diastolic dysfunction). - Aortic valve: No evidence of vegetation. - Mitral valve: Moderately dilated annulus. Structurally normal   valve. There was moderate regurgitation directed centrally. - Left atrium: The atrium was mildly to moderately dilated. No   evidence of thrombus in the atrial cavity or appendage. - Right atrium: No evidence of thrombus in the atrial cavity or   appendage. No evidence of thrombus in the atrial cavity or   appendage. - Atrial septum: No defect or patent foramen ovale was identified.   There was no right-to-left atrial level shunt, following an   increase in RA pressure induced by provocative maneuvers. - Pulmonic valve: No  evidence of vegetation. - Pulmonary arteries: Systolic pressure was mildly increased. PA   peak pressure: 40 mm Hg (S). - Pericardium, extracardiac: A trivial pericardial effusion was   identified.  SPECT 06/15/16  Nuclear stress EF: 58%.  There was no ST segment deviation noted during stress.  The study is normal.  This is a low risk study.  The left ventricular ejection fraction is hyperdynamic (>65%).   Normal pharmacologic nuclear stress test with no evidence of prior infarct or ischemia.   ASSESSMENT AND PLAN:  1.  Paroxysmal atrial fibrillation/flutter: Status post ablation 10/05/16.  On Eliquis.  No further episodes of atrial fibrillation.  This patients CHA2DS2-VASc Score and unadjusted Ischemic Stroke Rate (% per year) is equal to 3.2 % stroke rate/year from a score of 3  Above score calculated as 1 point each if present [CHF, HTN, DM, Vascular=MI/PAD/Aortic Plaque, Age if 64-74, or Female] Above score  calculated as 2 points each if present [Age > 75, or Stroke/TIA/TE]   2. PVCs: Currently asymptomatic.  No changes.  3. Hypertension: Well-controlled today.  4. Moderate mitral regurgitation: No symptoms of shortness of breath at this time.  Continue current management.    Current medicines are reviewed at length with the patient today.   The patient does not have concerns regarding her medicines.  The following changes were made today:  none  Labs/ tests ordered today include:  No orders of the defined types were placed in this encounter.    Disposition:   FU with Cordon Gassett 12 months  Signed, Anessia Oakland Meredith Leeds, MD  04/18/2017 3:03 PM     [facility] [address] [place] [address] [phone] (office) [phone] (fax)

## 2017-05-05 ENCOUNTER — Other Ambulatory Visit: Payer: Self-pay | Admitting: Cardiology

## 2017-05-05 DIAGNOSIS — I480 Paroxysmal atrial fibrillation: Secondary | ICD-10-CM

## 2017-05-30 DIAGNOSIS — M1990 Unspecified osteoarthritis, unspecified site: Secondary | ICD-10-CM | POA: Diagnosis not present

## 2017-05-30 DIAGNOSIS — R5383 Other fatigue: Secondary | ICD-10-CM | POA: Diagnosis not present

## 2017-05-30 DIAGNOSIS — N959 Unspecified menopausal and perimenopausal disorder: Secondary | ICD-10-CM | POA: Diagnosis not present

## 2017-05-30 DIAGNOSIS — M2550 Pain in unspecified joint: Secondary | ICD-10-CM | POA: Diagnosis not present

## 2017-05-30 DIAGNOSIS — R2689 Other abnormalities of gait and mobility: Secondary | ICD-10-CM | POA: Diagnosis not present

## 2017-05-30 DIAGNOSIS — D649 Anemia, unspecified: Secondary | ICD-10-CM | POA: Diagnosis not present

## 2017-07-08 DIAGNOSIS — N959 Unspecified menopausal and perimenopausal disorder: Secondary | ICD-10-CM | POA: Diagnosis not present

## 2017-07-08 DIAGNOSIS — M25552 Pain in left hip: Secondary | ICD-10-CM | POA: Diagnosis not present

## 2017-07-08 DIAGNOSIS — E039 Hypothyroidism, unspecified: Secondary | ICD-10-CM | POA: Diagnosis not present

## 2017-07-15 DIAGNOSIS — M25552 Pain in left hip: Secondary | ICD-10-CM | POA: Diagnosis not present

## 2017-08-05 DIAGNOSIS — M7062 Trochanteric bursitis, left hip: Secondary | ICD-10-CM | POA: Diagnosis not present

## 2017-09-02 DIAGNOSIS — I4891 Unspecified atrial fibrillation: Secondary | ICD-10-CM | POA: Diagnosis not present

## 2017-09-02 DIAGNOSIS — N959 Unspecified menopausal and perimenopausal disorder: Secondary | ICD-10-CM | POA: Diagnosis not present

## 2017-09-02 DIAGNOSIS — I10 Essential (primary) hypertension: Secondary | ICD-10-CM | POA: Diagnosis not present

## 2017-09-02 DIAGNOSIS — E039 Hypothyroidism, unspecified: Secondary | ICD-10-CM | POA: Diagnosis not present

## 2017-09-24 ENCOUNTER — Other Ambulatory Visit: Payer: Self-pay | Admitting: Obstetrics & Gynecology

## 2017-09-24 NOTE — Telephone Encounter (Signed)
Medication refill request: Celexa 20 Mg  Last AEX:  09/20/16 Next AEX: 11/04/17 Last MMG (if hormonal medication request): 11/29/16 Bi-rads Category 2 Benign  Refill authorized: Please refill if appropriate.

## 2017-10-23 ENCOUNTER — Telehealth: Payer: Self-pay | Admitting: Obstetrics & Gynecology

## 2017-10-23 NOTE — Telephone Encounter (Signed)
Left patient a message to call back to reschedule a future appointment that was cancelled by the provider for an AEX. °

## 2017-10-25 ENCOUNTER — Other Ambulatory Visit: Payer: Self-pay | Admitting: Cardiology

## 2017-10-25 DIAGNOSIS — I480 Paroxysmal atrial fibrillation: Secondary | ICD-10-CM

## 2017-10-28 ENCOUNTER — Other Ambulatory Visit: Payer: Self-pay | Admitting: Obstetrics & Gynecology

## 2017-10-28 NOTE — Telephone Encounter (Signed)
Medication refill request: Macrobid Last AEX:  09/20/2016 Next AEX: not scheduled Last MMG (if hormonal medication request): 11/29/2016 Refill authorized: #30, 0 refills, please advise.

## 2017-11-04 ENCOUNTER — Ambulatory Visit: Payer: Medicare Other | Admitting: Obstetrics & Gynecology

## 2017-11-27 DIAGNOSIS — Z85828 Personal history of other malignant neoplasm of skin: Secondary | ICD-10-CM | POA: Diagnosis not present

## 2017-11-27 DIAGNOSIS — L570 Actinic keratosis: Secondary | ICD-10-CM | POA: Diagnosis not present

## 2017-11-27 DIAGNOSIS — C44519 Basal cell carcinoma of skin of other part of trunk: Secondary | ICD-10-CM | POA: Diagnosis not present

## 2017-11-27 DIAGNOSIS — L821 Other seborrheic keratosis: Secondary | ICD-10-CM | POA: Diagnosis not present

## 2017-11-27 DIAGNOSIS — D225 Melanocytic nevi of trunk: Secondary | ICD-10-CM | POA: Diagnosis not present

## 2017-11-27 DIAGNOSIS — D485 Neoplasm of uncertain behavior of skin: Secondary | ICD-10-CM | POA: Diagnosis not present

## 2017-12-03 DIAGNOSIS — Z1231 Encounter for screening mammogram for malignant neoplasm of breast: Secondary | ICD-10-CM | POA: Diagnosis not present

## 2017-12-10 DIAGNOSIS — C44519 Basal cell carcinoma of skin of other part of trunk: Secondary | ICD-10-CM | POA: Diagnosis not present

## 2017-12-12 DIAGNOSIS — Z23 Encounter for immunization: Secondary | ICD-10-CM | POA: Diagnosis not present

## 2017-12-23 DIAGNOSIS — E7800 Pure hypercholesterolemia, unspecified: Secondary | ICD-10-CM | POA: Diagnosis not present

## 2017-12-23 DIAGNOSIS — F39 Unspecified mood [affective] disorder: Secondary | ICD-10-CM | POA: Diagnosis not present

## 2017-12-23 DIAGNOSIS — Z23 Encounter for immunization: Secondary | ICD-10-CM | POA: Diagnosis not present

## 2017-12-23 DIAGNOSIS — I10 Essential (primary) hypertension: Secondary | ICD-10-CM | POA: Diagnosis not present

## 2017-12-23 DIAGNOSIS — D649 Anemia, unspecified: Secondary | ICD-10-CM | POA: Diagnosis not present

## 2017-12-23 DIAGNOSIS — Z1389 Encounter for screening for other disorder: Secondary | ICD-10-CM | POA: Diagnosis not present

## 2017-12-23 DIAGNOSIS — K219 Gastro-esophageal reflux disease without esophagitis: Secondary | ICD-10-CM | POA: Diagnosis not present

## 2017-12-23 DIAGNOSIS — M1990 Unspecified osteoarthritis, unspecified site: Secondary | ICD-10-CM | POA: Diagnosis not present

## 2017-12-23 DIAGNOSIS — E039 Hypothyroidism, unspecified: Secondary | ICD-10-CM | POA: Diagnosis not present

## 2017-12-23 DIAGNOSIS — I4891 Unspecified atrial fibrillation: Secondary | ICD-10-CM | POA: Diagnosis not present

## 2017-12-23 DIAGNOSIS — Z0000 Encounter for general adult medical examination without abnormal findings: Secondary | ICD-10-CM | POA: Diagnosis not present

## 2018-01-01 DIAGNOSIS — M7062 Trochanteric bursitis, left hip: Secondary | ICD-10-CM | POA: Diagnosis not present

## 2018-01-01 DIAGNOSIS — M25512 Pain in left shoulder: Secondary | ICD-10-CM | POA: Diagnosis not present

## 2018-01-29 ENCOUNTER — Ambulatory Visit (INDEPENDENT_AMBULATORY_CARE_PROVIDER_SITE_OTHER): Payer: Medicare Other | Admitting: Family Medicine

## 2018-01-29 ENCOUNTER — Encounter: Payer: Self-pay | Admitting: Family Medicine

## 2018-01-29 VITALS — BP 130/84 | HR 69 | Temp 98.8°F | Ht 63.0 in | Wt 193.0 lb

## 2018-01-29 DIAGNOSIS — F909 Attention-deficit hyperactivity disorder, unspecified type: Secondary | ICD-10-CM | POA: Diagnosis not present

## 2018-01-29 DIAGNOSIS — I10 Essential (primary) hypertension: Secondary | ICD-10-CM | POA: Insufficient documentation

## 2018-01-29 DIAGNOSIS — Z723 Lack of physical exercise: Secondary | ICD-10-CM | POA: Insufficient documentation

## 2018-01-29 DIAGNOSIS — F1010 Alcohol abuse, uncomplicated: Secondary | ICD-10-CM | POA: Diagnosis not present

## 2018-01-29 DIAGNOSIS — F329 Major depressive disorder, single episode, unspecified: Secondary | ICD-10-CM | POA: Insufficient documentation

## 2018-01-29 DIAGNOSIS — F5101 Primary insomnia: Secondary | ICD-10-CM

## 2018-01-29 DIAGNOSIS — F5104 Psychophysiologic insomnia: Secondary | ICD-10-CM | POA: Insufficient documentation

## 2018-01-29 DIAGNOSIS — G4700 Insomnia, unspecified: Secondary | ICD-10-CM | POA: Insufficient documentation

## 2018-01-29 DIAGNOSIS — F39 Unspecified mood [affective] disorder: Secondary | ICD-10-CM | POA: Insufficient documentation

## 2018-01-29 DIAGNOSIS — Z719 Counseling, unspecified: Secondary | ICD-10-CM | POA: Insufficient documentation

## 2018-01-29 DIAGNOSIS — Z79899 Other long term (current) drug therapy: Secondary | ICD-10-CM | POA: Insufficient documentation

## 2018-01-29 MED ORDER — TRAZODONE HCL 50 MG PO TABS: ORAL_TABLET | ORAL | 0 refills | Status: DC

## 2018-01-29 NOTE — Progress Notes (Signed)
New patient office visit note:  Impression and Recommendations:    1. Essential hypertension   2. Primary insomnia   3. Attention deficit hyperactivity disorder (ADHD), unspecified ADHD type   4. Excessive drinking of alcohol   5. Mood disorder - mixed depression and anxiety   6. Encounter for long-term (current) use of high-risk medication   7. Inactivity   8. Health education/counseling      Essential hypertension  Primary insomnia - Plan: traZODone (DESYREL) 50 MG tablet  Attention deficit hyperactivity disorder (ADHD), unspecified ADHD type  Excessive drinking of alcohol  Mood disorder - mixed depression and anxiety  Encounter for long-term (current) use of high-risk medication  Inactivity  Health education/counseling   Sleep disorder Will start the patient on trazodone to aid with sleep  Discussed with the patient regarding sleep habits. Discussed with the patient regarding sleep meditations.   Follow up regarding trazodone in 8 weeks.   Exercise:  - Advised patient to continue working toward exercising to improve health.    -PT will begin with 15 minutes of activity daily. Recommended that the patient eventually strive for at least 150 minutes of cardio per week according to the [facility].   - Healthy dietary habits encouraged, including low-carb, and high amounts of lean protein in diet.   - Patient should also consume adequate amounts of water - half of body weight in oz of water per day  HTN:  Blood pressure at goal. On Lasix per cardiology  Mood disorder:  On celexa at this time.   Health Education Counseling: Discussed patient decreasing wine consumption to 1 glass of wine Sunday-Thursday and then on Fridays-Saturday a max 3 glasses per night.   Also discussed for the patient to try and increase walking by going to the [facility] 1 time a week to start and build up a routine of exercise.  Pt was in the office today for 50+ minutes, with over 50% time  spent in face to face counseling of patients various medical conditions, treatment plans of those medical conditions including medicine management and lifestyle modification, strategies to improve health and well being; and in coordination of care. SEE ABOVE TREATMENT PLAN FOR DETAILS   Follow up in 8 weeks for discussion on healthy goals and trazodone use.    Education and routine counseling performed. Handouts provided.   ---> No labs were ordered since patient just had physical with complete labs at her prior PCP Kelton Pillar from [place].  We will await those results.    Meds ordered this encounter  Medications  . traZODone (DESYREL) 50 MG tablet    Sig: 0.5-1 tab po q hs prn sleep    Dispense:  90 tablet    Refill:  0    Medications Discontinued During This Encounter  Medication Reason  . ALPRAZolam (XANAX) 0.5 MG tablet   . docusate sodium (COLACE) 100 MG capsule       Gross side effects, risk and benefits, and alternatives of medications discussed with patient.  Patient is aware that all medications have potential side effects and we are unable to predict every side effect or drug-drug interaction that may occur.  Expresses verbal understanding and consents to current therapy plan and treatment regimen.  Return for (2) 6-8 wk f/up for start of sleep med; health goals; wt loss etc.  Please see AVS handed out to patient at the end of our visit for further patient instructions/ counseling done pertaining to today's  office visit.    Note:  This document was prepared using Dragon voice recognition software and may include unintentional dictation errors.  This document serves as a record of services personally performed by Mellody Dance, DO. It was created on her behalf by Steva Colder, a trained medical scribe. The creation of this record is based on the scribe's personal observations and the provider's statements to them.   I have reviewed the above medical  documentation for accuracy and completeness and I concur.  Mellody Dance, DO, D.O. 01/30/2018 6:49 AM        ---------------------------------------------------------------------------------------------------------------------------------------------------------------------------------------------    Subjective:    Chief complaint:   Chief Complaint  Patient presents with  . Establish Care     HPI: Savannah Hill is a pleasant 68 y.o. female who presents to [place] at [facility] today to review their medical history with me and establish care.   I asked the patient to review their chronic problem list with me to ensure everything was updated and accurate.    All recent office visits with other providers, any medical records that patient brought in etc  - I reviewed today.     We asked pt to get [place] their medical records from ALL providers/specialists that they had seen within the past 3-5 years- if they are in private practice and/or do not work for [employer], [facility], [place], [place] or [employer] owned practice.  Told them to call their specialists to clarify this if they are not sure.   PMHx:  She was diagnosed with small cell carcinoma cervical cancer at age 73 in 11. She was taken off her estrogen by Dr. Janeann Merl, due to her being on eliquis due to the history of A-fib. She has had a lengthy discussion with Dr. [name] Heck regarding this matter as well.    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   She has a hx of HTN.   She was treated with thyroid medication due to an abnormal thyroid lab, however, it led to adverse side effects of increased fatigue. She informed them of this and stopped taking it. On her most recent labs, per patient, her thyroid was normal.  She used to take Xanax (0.5 pill) prior to a flight, now she takes xanax occasionally to aid  with sleep.   She has issues with sleep and she has tried melatonin (unsure of the mg dosage), sleep-aid (she was out for 2 days), and trazodone. More recently last night, she took a 1/8 of a pill of sleep aid that alleviated her issues with sleep. She goes to sleep every night at 10 PM.  She notes increased joint stiffness and notes that she trains with a trainer twice a week.   She has a dermatologist at this time, however, she is unsure of the dermatologists name.    Surgical hx:  She had an ablation 1 year ago performed by Cardiologist, Dr. Curt Bears and hasn't had an occurrence of A-fib since.   She had a total hysterectomy at age 47.   FHx:  Her father had DM and it was diagnosed in his late 34's. Her mother has had high blood pressure since her 30-40's.    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   She consumes several glasses of wine nightly.   She notes  that she has lost up to 25 lbs over 8 weeks via a healthy eating diet and kept the weight off from January to December, however, she gained the weight back when her father succumbed to a stroke.    Wt Readings from Last 3 Encounters:  01/29/18 193 lb (87.5 kg)  04/18/17 180 lb (81.6 kg)  01/16/17 184 lb 3.2 oz (83.6 kg)   BP Readings from Last 3 Encounters:  01/29/18 130/84  04/18/17 112/86  01/16/17 126/88   Pulse Readings from Last 3 Encounters:  01/29/18 69  04/18/17 86  01/16/17 75   BMI Readings from Last 3 Encounters:  01/29/18 34.19 kg/m  04/18/17 31.89 kg/m  01/16/17 32.63 kg/m    Patient Care Team    Relationship Specialty Notifications Start End  Mellody Dance, DO PCP - General Family Medicine  01/29/18   Constance Haw, MD PCP - Cardiology Cardiology Admissions 04/18/17    Comment: Electrophysiologist  Megan Salon, MD Consulting Physician Gynecology  01/29/18   Specialists, Dermatology Consulting Physician Dermatology  01/29/18    Comment:  practice where Crista Luria used to work at- goes Gilberto Better, MD Consulting Physician Orthopedic Surgery  01/29/18     Patient Active Problem List   Diagnosis Date Noted  . Essential hypertension 01/29/2018    Priority: High  . Mood disorder - mixed depression and anxiety 01/29/2018    Priority: High  . Paroxysmal atrial fibrillation ([place]) 10/05/2016    Priority: High  . h/o Cervical cancer 10/01/2011    Priority: High  . H/O total hysterectomy 10/01/2011    Priority: High  . Primary insomnia 01/29/2018    Priority: Medium  . Mitral valve disease     Priority: Medium  . LAFB (left anterior fascicular block) 07/01/2013    Priority: Medium  . Alcohol abuse 10/01/2011    Priority: Medium  . Inactivity 01/29/2018    Priority: Low  . Attention deficit hyperactivity disorder (ADHD) 01/29/2018  . Excessive drinking of alcohol 01/29/2018  . Encounter for long-term (current) use of high-risk medication 01/29/2018  . Health education/counseling 01/29/2018  . Current use of long term anticoagulation 10/06/2016  . PAF (paroxysmal atrial fibrillation) ([place]) 07/01/2013  . Lymphedema 07/01/2013  . Chest pain at rest 10/01/2011  . Leg swelling 10/01/2011       As reported by pt:  Past Medical History:  Diagnosis Date  . Anemia    borderline  . Atrial fibrillation ([place])    a. s/p ablation on 10/05/2016  . Cancer [facility])    cervical/rad hysterectomy/bso wiht chemo for small cell ca  . Hypertension      Past Surgical History:  Procedure Laterality Date  . ABLATION OF DYSRHYTHMIC FOCUS  10/05/2016  . ATRIAL FIBRILLATION ABLATION N/A 10/05/2016   Procedure: Atrial Fibrillation Ablation;  Surgeon: Constance Haw, MD;  Location: [place] CV LAB;  Service: Cardiovascular;  Laterality: N/A;  . IR RADIOLOGY PERIPHERAL GUIDED IV START  09/28/2016  . IR US GUIDE VASC ACCESS RIGHT  09/28/2016  . RADICAL ABDOMINAL HYSTERECTOMY  1986   with BSO  . TEE WITHOUT CARDIOVERSION  N/A 06/07/2016   Procedure: TRANSESOPHAGEAL ECHOCARDIOGRAM (TEE);  Surgeon: Sanda Klein, MD;  Location: [facility] ENDOSCOPY;  Service: Cardiovascular;  Laterality: N/A;     Family History  Problem Relation Age of Onset  . Diabetes Mother   . Hypertension Mother   . Lung cancer Mother   . Skin cancer Father   . CVA Father   .  Cancer Paternal Grandfather        unknown type     Social History   Substance and Sexual Activity  Drug Use No     Social History   Substance and Sexual Activity  Alcohol Use Yes  . Alcohol/week: 4.0 - 5.0 standard drinks  . Types: 4 - 5 Standard drinks or equivalent per week     Social History   Tobacco Use  Smoking Status Never Smoker  Smokeless Tobacco Never Used     Current Meds  Medication Sig  . Cholecalciferol (VITAMIN D3) 125 MCG (5000 UT) CAPS Take 1 capsule by mouth daily.  . citalopram (CELEXA) 20 MG tablet TAKE 1 TABLET DAILY  . ELIQUIS 5 MG TABS tablet TAKE 1 TABLET TWICE A DAY. (THIS IS ONE MONTH SUPPLY)  . furosemide (LASIX) 20 MG tablet Take 20 mg by mouth daily.  . Multiple Vitamin (MULTIVITAMIN WITH MINERALS) TABS Take 1 tablet by mouth daily.  . nitrofurantoin, macrocrystal-monohydrate, (MACROBID) 100 MG capsule TAKE 1 CAPSULE TWICE DAILY AS NEEDED FOR URINARY TRACTINFECTION    Allergies: Patient has no known allergies.   Review of Systems  Constitutional: Negative for chills, diaphoresis, fever, malaise/fatigue and weight loss.  HENT: Negative for congestion, sore throat and tinnitus.   Eyes: Negative for blurred vision, double vision and photophobia.  Respiratory: Negative for cough and wheezing.   Cardiovascular: Negative for chest pain and palpitations.  Gastrointestinal: Negative for blood in stool, diarrhea, nausea and vomiting.  Genitourinary: Negative for dysuria, frequency and urgency.  Musculoskeletal: Negative for joint pain and myalgias.  Skin: Negative for itching and rash.  Neurological: Negative for  dizziness, focal weakness, weakness and headaches.  Endo/Heme/Allergies: Negative for environmental allergies and polydipsia. Does not bruise/bleed easily.  Psychiatric/Behavioral: Negative for depression and memory loss. The patient is not nervous/anxious and does not have insomnia.         Objective:   Blood pressure 130/84, pulse 69, temperature 98.8 F (37.1 C), height 5\' 3"  (1.6 m), weight 193 lb (87.5 kg), last menstrual period 03/19/1984, SpO2 100 %. Body mass index is 34.19 kg/m. General: Well Developed, well nourished, and in no acute distress.  Neuro: Alert and oriented x3, extra-ocular muscles intact, sensation grossly intact.  HEENT:[place]/AT, PERRLA, neck supple, No carotid bruits Skin: no gross rashes  Cardiac: Regular rate and rhythm Respiratory: Essentially clear to auscultation bilaterally. Not using accessory muscles, speaking in full sentences.  Abdominal: not grossly distended Musculoskeletal: Ambulates w/o diff, FROM * 4 ext.  Vasc: less 2 sec cap RF, warm and pink  Psych:  No HI/SI, judgement and insight good, Euthymic mood. Full Affect.    No results found for this or any previous visit (from the past 2160 hour(s)).

## 2018-01-29 NOTE — Patient Instructions (Addendum)
Goals:  1. Wine consumption cut to 1 glass of wine Sunday-Thursday and then on Fridays-Saturday a max 3 glasses per night.  2. Try and increase your walking by going to the [facility] 1 time a week to start and build up a routine of exercise.   If you have insomnia or difficulty sleeping, this information is for you:  - Avoid caffeinated beverages after lunch,  no alcoholic beverages,  no eating within 2-3 hours of lying down,  avoid exposure to blue light before bed,  avoid daytime naps, and  needs to maintain a regular sleep schedule- go to sleep and wake up around the same time every night.   - Resolve concerns or worries before entering bedroom:  Discussed relaxation techniques with patient and to keep a journal to write down fears\ worries.  I suggested seeing a counselor for CBT.   - Recommend patient meditate or do deep breathing exercises to help relax.   Incorporate the use of white noise machines or listen to "sleep meditation music", or recordings of guided meditations for sleep from YouTube which are free, such as  "guided meditation for detachment from over thinking"  by Mayford Knife.       Please realize, EXERCISE IS MEDICINE!  -  American Heart Association [facility]) guidelines for exercise : If you are in good health, without any medical conditions, you should engage in 150-300 minutes of moderate intensity aerobic activity per week.  This means you should be huffing and puffing throughout your workout.   Engaging in regular exercise will improve brain function and memory, as well as improve mood, boost immune system and help with weight management.  As well as the other, more well-known effects of exercise such as decreasing blood sugar levels, decreasing blood pressure,  and decreasing bad cholesterol levels/ increasing good cholesterol levels.     -  The AHA strongly endorses consumption of a diet that contains a variety of foods from all the food categories with an emphasis on  fruits and vegetables; fat-free and low-fat dairy products; cereal and grain products; legumes and nuts; and fish, poultry, and/or extra lean meats.    Excessive food intake, especially of foods high in saturated and trans fats, sugar, and salt, should be avoided.    Adequate water intake of roughly 1/2 of your weight in pounds, should equal the ounces of water per day you should drink.  So for instance, if you're 200 pounds, that would be 100 ounces of water per day.         Mediterranean Diet  Why follow it? Research shows  Those who follow the Mediterranean diet have a reduced risk of heart disease   The diet is associated with a reduced incidence of Parkinson's and Alzheimer's diseases  People following the diet may have longer life expectancies and lower rates of chronic diseases   The Dietary Guidelines for Americans recommends the Mediterranean diet as an eating plan to promote health and prevent disease  What Is the Mediterranean Diet?   Healthy eating plan based on typical foods and recipes of Mediterranean-style cooking  The diet is primarily a plant based diet; these foods should make up a majority of meals   Starches - Plant based foods should make up a majority of meals - They are an important sources of vitamins, minerals, energy, antioxidants, and fiber - Choose whole grains, foods high in fiber and minimally processed items  - Typical grain sources include wheat, oats,  barley, corn, brown rice, bulgar, farro, millet, polenta, couscous  - Various types of beans include chickpeas, lentils, fava beans, black beans, white beans   Fruits  Veggies - Large quantities of antioxidant rich fruits & veggies; 6 or more servings  - Vegetables can be eaten raw or lightly drizzled with oil and cooked  - Vegetables common to the traditional Mediterranean Diet include: artichokes, arugula, beets, broccoli, brussel sprouts, cabbage, carrots, celery, collard greens, cucumbers, eggplant,  kale, leeks, lemons, lettuce, mushrooms, okra, onions, peas, peppers, potatoes, pumpkin, radishes, rutabaga, shallots, spinach, sweet potatoes, turnips, zucchini - Fruits common to the Mediterranean Diet include: apples, apricots, avocados, cherries, clementines, dates, figs, grapefruits, grapes, melons, nectarines, oranges, peaches, pears, pomegranates, strawberries, tangerines  Fats - Replace butter and margarine with healthy oils, such as olive oil, canola oil, and tahini  - Limit nuts to no more than a handful a day  - Nuts include walnuts, almonds, pecans, pistachios, pine nuts  - Limit or avoid candied, honey roasted or heavily salted nuts - Olives are central to the [employer] - can be eaten whole or used in a variety of dishes   Meats Protein - Limiting red meat: no more than a few times a month - When eating red meat: choose lean cuts and keep the portion to the size of deck of cards - Eggs: approx. 0 to 4 times a week  - Fish and lean poultry: at least 2 a week  - Healthy protein sources include, chicken, [place], lean beef, lamb - Increase intake of seafood such as tuna, salmon, trout, mackerel, shrimp, scallops - Avoid or limit high fat processed meats such as sausage and bacon  Dairy - Include moderate amounts of low fat dairy products  - Focus on healthy dairy such as fat free yogurt, skim milk, low or reduced fat cheese - Limit dairy products higher in fat such as whole or 2% milk, cheese, ice cream  Alcohol - Moderate amounts of red wine is ok  - No more than 5 oz daily for women (all ages) and men older than age 27  - No more than 10 oz of wine daily for men younger than 27  Other - Limit sweets and other desserts  - Use herbs and spices instead of salt to flavor foods  - Herbs and spices common to the traditional Mediterranean Diet include: basil, bay leaves, chives, cloves, cumin, fennel, garlic, lavender, marjoram, mint, oregano, parsley, pepper, rosemary, sage,  savory, sumac, tarragon, thyme   Its not just a diet, its a lifestyle:   The Mediterranean diet includes lifestyle factors typical of those in the region   Foods, drinks and meals are best eaten with others and savored  Daily physical activity is important for overall good health  This could be strenuous exercise like running and aerobics  This could also be more leisurely activities such as walking, housework, yard-work, or taking the stairs  Moderation is the key; a balanced and healthy diet accommodates most foods and drinks  Consider portion sizes and frequency of consumption of certain foods   Meal Ideas & Options:   Breakfast:  o Whole wheat toast or whole wheat English muffins with peanut butter & hard boiled egg o Steel cut oats topped with apples & cinnamon and skim milk  o Fresh fruit: banana, strawberries, melon, berries, peaches  o Smoothies: strawberries, bananas, greek yogurt, peanut butter o Low fat greek yogurt with blueberries and granola  o Egg white omelet with  spinach and mushrooms o Breakfast couscous: whole wheat couscous, apricots, skim milk, cranberries   Sandwiches:  o Hummus and grilled vegetables (peppers, zucchini, squash) on whole wheat bread   o Grilled chicken on whole wheat pita with lettuce, tomatoes, cucumbers or tzatziki  o Tuna salad on whole wheat bread: tuna salad made with greek yogurt, olives, red peppers, capers, green onions o Garlic rosemary lamb pita: lamb sauted with garlic, rosemary, salt & pepper; add lettuce, cucumber, greek yogurt to pita - flavor with lemon juice and black pepper   Seafood:  o Mediterranean grilled salmon, seasoned with garlic, basil, parsley, lemon juice and black pepper o Shrimp, lemon, and spinach whole-grain pasta salad made with low fat greek yogurt  o Seared scallops with lemon orzo  o Seared tuna steaks seasoned salt, pepper, coriander topped with tomato mixture of olives, tomatoes, olive oil, minced  garlic, parsley, green onions and cappers   Meats:  o Herbed greek chicken salad with kalamata olives, cucumber, feta  o Red bell peppers stuffed with spinach, bulgur, lean ground beef (or lentils) & topped with feta   o Kebabs: skewers of chicken, tomatoes, onions, zucchini, squash  o [place] burgers: made with red onions, mint, dill, lemon juice, feta cheese topped with roasted red peppers  Vegetarian o Cucumber salad: cucumbers, artichoke hearts, celery, red onion, feta cheese, tossed in olive oil & lemon juice  o Hummus and whole grain pita points with a greek salad (lettuce, tomato, feta, olives, cucumbers, red onion) o Lentil soup with celery, carrots made with vegetable broth, garlic, salt and pepper  o Tabouli salad: parsley, bulgur, mint, scallions, cucumbers, tomato, radishes, lemon juice, olive oil, salt and pepper.        Behavior Modification Ideas for Weight Management  Weight management involves adopting a healthy lifestyle that includes a knowledge of nutrition and exercise, a positive attitude and the right kind of motivation. Internal motives such as better health, increased energy, self-esteem and personal control increase your chances of lifelong weight management success.  Remember to have realistic goals and think long-term success. Believe in yourself and you can do it. The following information will give you ideas to help you meet your goals.  Control Your Home Environment  Eat only while sitting down at the kitchen or dining room table. Do not eat while watching television, reading, cooking, talking on the phone, standing at the refrigerator or working on the computer. Keep tempting foods out of the house -- don't buy them. Keep tempting foods out of sight. Have low-calorie foods ready to eat. Unless you are preparing a meal, stay out of the kitchen. Have healthy snacks at your disposal, such as small pieces of fruit, vegetables, canned fruit, pretzels, low-fat  string cheese and nonfat cottage cheese.  Control Your Work Environment  Do not eat at [employer] or keep tempting snacks at your desk. If you get hungry between meals, plan healthy snacks and bring them with you to work. During your breaks, go for a walk instead of eating. If you work around food, plan in advance the one item you will eat at mealtime. Make it inconvenient to nibble on food by chewing gum, sugarless candy or drinking water or another low-calorie beverage. Do not work through meals. Skipping meals slows down metabolism and may result in overeating at the next meal. If food is available for special occasions, either pick the healthiest item, nibble on low-fat snacks brought from home, don't have anything offered, choose one  option and have a small amount, or have only a beverage.  Control Your Mealtime Environment  Serve your plate of food at the stove or kitchen counter. Do not put the serving dishes on the table. If you do put dishes on the table, remove them immediately when finished eating. Fill half of your plate with vegetables, a quarter with lean protein and a quarter with starch. Use smaller plates, bowls and glasses. A smaller portion will look large when it is in a little dish. Politely refuse second helpings. When fixing your plate, limit portions of food to one scoop/serving or less.   Daily Food Management  Replace eating with another activity that you will not associate with food. Wait 20 minutes before eating something you are craving. Drink a large glass of water or diet soda before eating. Always have a big glass or bottle of water to drink throughout the day. Avoid high-calorie add-ons such as cream with your coffee, butter, mayonnaise and salad dressings.  Shopping: Do not shop when hungry or tired. Shop from a list and avoid buying anything that is not on your list. If you must have tempting foods, buy individual-sized packages and try to find a  lower-calorie alternative. Don't taste test in the store. Read food labels. Compare products to help you make the healthiest choices.  Preparation: Chew a piece of gum while cooking meals. Use a quarter teaspoon if you taste test your food. Try to only fix what you are going to eat, leaving yourself no chance for seconds. If you have prepared more food than you need, portion it into individual containers and freeze or refrigerate immediately. Don't snack while cooking meals.  Eating: Eat slowly. Remember it takes about 20 minutes for your stomach to send a message to your brain that it is full. Don't let fake hunger make you think you need more. The ideal way to eat is to take a bite, put your utensil down, take a sip of water, cut your next bite, take a bit, put your utensil down and so on. Do not cut your food all at one time. Cut only as needed. Take small bites and chew your food well. Stop eating for a minute or two at least once during a meal or snack. Take breaks to reflect and have conversation.  Cleanup and Leftovers: Label leftovers for a specific meal or snack. Freeze or refrigerate individual portions of leftovers. Do not clean up if you are still hungry.  Eating Out and Social Eating  Do not arrive hungry. Eat something light before the meal. Try to fill up on low-calorie foods, such as vegetables and fruit, and eat smaller portions of the high-calorie foods. Eat foods that you like, but choose small portions. If you want seconds, wait at least 20 minutes after you have eaten to see if you are actually hungry or if your eyes are bigger than your stomach. Limit alcoholic beverages. Try a soda water with a twist of lime. Do not skip other meals in the day to save room for the special event.  At Restaurants: Order  la carte rather than buffet style. Order some vegetables or a salad for an appetizer instead of eating bread. If you order a high-calorie dish, share it with  someone. Try an after-dinner mint with your coffee. If you do have dessert, share it with two or more people. Don't overeat because you do not want to waste food. Ask for a doggie bag to take extra  food home. Tell the server to put half of your entree in a to go bag before the meal is served to you. Ask for salad dressing, gravy or high-fat sauces on the side. Dip the tip of your fork in the dressing before each bite. If bread is served, ask for only one piece. Try it plain without butter or oil. At [employer] where oil and vinegar is served with bread, use only a small amount of oil and a lot of vinegar for dipping.  At a Friend[facility] House: Offer to bring a dish, appetizer or dessert that is low in calories. Serve yourself small portions or tell the host that you only want a small amount. Stand or sit away from the snack table. Stay away from the kitchen or stay busy if you are near the food. Limit your alcohol intake.  At [employer] and Cafeterias: Cover most of your plate with lettuce and/or vegetables. Use a salad plate instead of a dinner plate. After eating, clear away your dishes before having coffee or tea.  Entertaining at Home: Explore low-fat, low-cholesterol cookbooks. Use single-serving foods like chicken breasts or hamburger patties. Prepare low-calorie appetizers and desserts.   Holidays: Keep tempting foods out of sight. Decorate the house without using food. Have low-calorie beverages and foods on hand for guests. Allow yourself one planned treat a day. Don't skip meals to save up for the holiday feast. Eat regular, planned meals.   Exercise Well  Make exercise a priority and a planned activity in the day. If possible, walk the entire or part of the distance to work. Get an exercise buddy. Go for a walk with a colleague during one of your breaks, go to the gym, run or take a walk with a friend, walk in the mall with a shopping companion. Park at the end of the  parking lot and walk to the store or office entrance. Always take the stairs all of the way or at least part of the way to your floor. If you have a desk job, walk around the office frequently. Do leg lifts while sitting at your desk. Do something outside on the weekends like going for a hike or a bike ride.   Have a Healthy Attitude  Make health your weight management priority. Be realistic. Have a goal to achieve a healthier you, not necessarily the lowest weight or ideal weight based on calculations or tables. Focus on a healthy eating style, not on dieting. Dieting usually lasts for a short amount of time and rarely produces long-term success. Think long term. You are developing new healthy behaviors to follow next month, in a year and in a decade.    This information is for educational purposes only and is not intended to replace the advice of your doctor or health care provider. We encourage you to discuss with your doctor any questions or concerns you may have.        Guidelines for Losing Weight   We want weight loss that will last so you should lose 1-2 pounds a week.  THAT IS IT! Please pick THREE things a month to change. Once it is a habit check off the item. Then pick another three items off the list to become habits.  If you are already doing a habit on the list GREAT!  Cross that item off!  Dont drink your calories. Ie, alcohol, soda, fruit juice, and sweet tea.   Drink more water. Drink a glass when you feel  hungry or before each meal.   Eat breakfast - Complex carb and protein (likeDannon light and fit yogurt, oatmeal, fruit, eggs, [place] bacon).  Measure your cereal.  Eat no more than one cup a day. (ie Kashi)  Eat an apple a day.  Add a vegetable a day.  Try a new vegetable a month.  Use Pam! Stop using oil or butter to cook.  Dont finish your plate or use smaller plates.  Share your dessert.  Eat sugar free Jello for dessert or frozen  grapes.  Dont eat 2-3 hours before bed.  Switch to whole wheat bread, pasta, and brown rice.  Make healthier choices when you eat out. No fries!  Pick baked chicken, NOT fried.  Dont forget to SLOW DOWN when you eat. It is not going anywhere.   Take the stairs.  Park far away in the parking lot  Lift soup cans (or weights) for 10 minutes while watching TV.  Walk at work for 10 minutes during break.  Walk outside 1 time a week with your friend, kids, dog, or significant other.  Start a walking group at church.  Walk the mall as much as you can tolerate.   Keep a food diary.  Weigh yourself daily.  Walk for 15 minutes 3 days per week.  Cook at home more often and eat out less. If life happens and you go back to old habits, it is okay.  Just start over. You can do it!  If you experience chest pain, get short of breath, or tired during the exercise, please stop immediately and inform your doctor.    Before you even begin to attack a weight-loss plan, it pays to remember this: You are not fat. You have fat. Losing weight isn't about blame or shame; it's simply another achievement to accomplish. Dieting is like any other skill--you have to buckle down and work at it. As long as you act in a smart, reasonable way, you'll ultimately get where you want to be. Here are some weight loss pearls for you.   1. It's Not a Diet. It's a Lifestyle Thinking of a diet as something you're on and suffering through only for the short term doesn't work. To shed weight and keep it off, you need to make permanent changes to the way you eat. It's OK to indulge occasionally, of course, but if you cut calories temporarily and then revert to your old way of eating, you'll gain back the weight quicker than you can say yo-yo. Use it to lose it. Research shows that one of the best predictors of long-term weight loss is how many pounds you drop in the first month. For that reason, nutritionists often  suggest being stricter for the first two weeks of your new eating strategy to build momentum. Cut out added sugar and alcohol and avoid unrefined carbs. After that, figure out how you can reincorporate them in a way that's healthy and maintainable.  2. There's a Right Way to Exercise Working out burns calories and fat and boosts your metabolism by building muscle. But those trying to lose weight are notorious for overestimating the number of calories they burn and underestimating the amount they take in. Unfortunately, your system is biologically programmed to hold on to extra pounds and that means when you start exercising, your body senses the deficit and ramps up its hunger signals. If you're not diligent, you'll eat everything you burn and then some. Use it, to lose it. Cardio  gets all the exercise glory, but strength and interval training are the real heroes. They help you build lean muscle, which in turn increases your metabolism and calorie-burning ability 3. Don't Overreact to Mild Hunger Some people have a hard time losing weight because of hunger anxiety. To them, being hungry is bad--something to be avoided at all costs--so they carry snacks with them and eat when they don't need to. Others eat because they're stressed out or bored. While you never want to get to the point of being ravenous (that's when bingeing is likely to happen), a hunger pang, a craving, or the fact that it's 3:00 p.m. should not send you racing for the vending machine or obsessing about the energy bar in your purse. Ideally, you should put off eating until your stomach is growling and it's difficult to concentrate.  Use it to lose it. When you feel the urge to eat, use the HALT method. Ask yourself, Am I really hungry? Or am I angry or anxious, lonely or bored, or tired? If you're still not certain, try the apple test. If you're truly hungry, an apple should seem delicious; if it doesn't, something else is going on. Or you can  try drinking water and making yourself busy, if you are still hungry try a healthy snack.  4. Not All Calories Are Created Equal The mechanics of weight loss are pretty simple: Take in fewer calories than you use for energy. But the kind of food you eat makes all the difference. Processed food that's high in saturated fat and refined starch or sugar can cause inflammation that disrupts the hormone signals that tell your brain you're full. The result: You eat a lot more.  Use it to lose it. Clean up your diet. Swap in whole, unprocessed foods, including vegetables, lean protein, and healthy fats that will fill you up and give you the biggest nutritional bang for your calorie buck. In a few weeks, as your brain starts receiving regular hunger and fullness signals once again, you'll notice that you feel less hungry overall and naturally start cutting back on the amount you eat.  5. Protein, Produce, and Plant-Based Fats Are Your Weight-Loss Trinity Here's why eating the three Ps regularly will help you drop pounds. Protein fills you up. You need it to build lean muscle, which keeps your metabolism humming so that you can torch more fat. People in a weight-loss program who ate double the recommended daily allowance for protein (about 110 grams for a 150-pound woman) lost 70 percent of their weight from fat, while people who ate the RDA lost only about 40 percent, one study found. Produce is packed with filling fiber. "It's very difficult to consume too many calories if you're eating a lot of vegetables. Example: Three cups of broccoli is a lot of food, yet only 93 calories. (Fruit is another story. It can be easy to overeat and can contain a lot of calories from sugar, so be sure to monitor your intake.) Plant-based fats like olive oil and those in avocados and nuts are healthy and extra satiating.  Use it to lose it. Aim to incorporate each of the three Ps into every meal and snack. People who eat protein  throughout the day are able to keep weight off, according to a study in the [place] of Clinical Nutrition. In addition to meat, poultry and seafood, good sources are beans, lentils, eggs, tofu, and yogurt. As for fat, keep portion sizes in check by  measuring out salad dressing, oil, and nut butters (shoot for one to two tablespoons). Finally, eat veggies or a little fruit at every meal. People who did that consumed 308 fewer calories but didn't feel any hungrier than when they didn't eat more produce.  7. How You Eat Is As Important As What You Eat In order for your brain to register that you're full, you need to focus on what you're eating. Sit down whenever you eat, preferably at a table. Turn off the TV or computer, put down your phone, and look at your food. Smell it. Chew slowly, and don't put another bite on your fork until you swallow. When women ate lunch this attentively, they consumed 30 percent less when snacking later than those who listened to an audiobook at lunchtime, according to a study in the [employer]. 8. Weighing Yourself Really Works The scale provides the best evidence about whether your efforts are paying off. Seeing the numbers tick up or down or stagnate is motivation to keep going--or to rethink your approach. A 2015 study at [facility] found that daily weigh-ins helped people lose more weight, keep it off, and maintain that loss, even after two years. Use it to lose it. Step on the scale at the same time every day for the best results. If your weight shoots up several pounds from one weigh-in to the next, don't freak out. Eating a lot of salt the night before or having your period is the likely culprit. The number should return to normal in a day or two. It's a steady climb that you need to do something about. 9. Too Much Stress and Too Little Sleep Are Your Enemies When you're tired and frazzled, your body cranks up the production of cortisol,  the stress hormone that can cause carb cravings. Not getting enough sleep also boosts your levels of ghrelin, a hormone associated with hunger, while suppressing leptin, a hormone that signals fullness and satiety. People on a diet who slept only five and a half hours a night for two weeks lost 55 percent less fat and were hungrier than those who slept eight and a half hours, according to a study in the [place]. Use it to lose it. Prioritize sleep, aiming for seven hours or more a night, which research shows helps lower stress. And make sure you're getting quality zzz's. If a snoring spouse or a fidgety cat wakes you up frequently throughout the night, you may end up getting the equivalent of just four hours of sleep, according to a study from [facility]. Keep pets out of the bedroom, and use a white-noise app to drown out snoring. 10. You Will Hit a plateau--And You Can Bust Through It As you slim down, your body releases much less leptin, the fullness hormone.  If you're not strength training, start right now. Building muscle can raise your metabolism to help you overcome a plateau. To keep your body challenged and burning calories, incorporate new moves and more intense intervals into your workouts or add another sweat session to your weekly routine. Alternatively, cut an extra 100 calories or so a day from your diet. Now that you've lost weight, your body simply doesn't need as much fuel.    Since food equals calories, in order to lose weight you must either eat fewer calories, exercise more to burn off calories with activity, or both. Food that is not used to fuel the body is stored as  fat. A major component of losing weight is to make smarter food choices. Here's how:  1)   Limit non-nutritious foods, such as: Sugar, honey, syrups and candy Pastries, donuts, pies, cakes and cookies Soft drinks, sweetened juices and alcoholic beverages  2)  Cut down on  high-fat foods by: - Choosing poultry, fish or lean red meat - Choosing low-fat cooking methods, such as baking, broiling, steaming, grilling and boiling - Using low-fat or non-fat dairy products - Using vinaigrette, herbs, lemon or fat-free salad dressings - Avoiding fatty meats, such as bacon, sausage, franks, ribs and luncheon meats - Avoiding high-fat snacks like nuts, chips and chocolate - Avoiding fried foods - Using less butter, margarine, oil and mayonnaise - Avoiding high-fat gravies, cream sauces and cream-based soups  3) Eat a variety of foods, including: - Fruit and vegetables that are raw, steamed or baked - Whole grains, breads, cereal, rice and pasta - Dairy products, such as low-fat or non-fat milk or yogurt, low-fat cottage cheese and low-fat cheese - Protein-rich foods like chicken, [place], fish, lean meat and legumes, or beans  4) Change your eating habits by: - Eat three balanced meals a day to help control your hunger - Watch portion sizes and eat small servings of a variety of foods - Choose low-calorie snacks - Eat only when you are hungry and stop when you are satisfied - Eat slowly and try not to perform other tasks while eating - Find other activities to distract you from food, such as walking, taking up a hobby or being involved in the community - Include regular exercise in your daily routine ( minimum of 20 min of moderate-intensity exercise at least 5 days/week)  - Find a support group, if necessary, for emotional support in your weight loss journey           Easy ways to cut 100 calories   1. Eat your eggs with hot sauce OR salsa instead of cheese.  Eggs are great for breakfast, but many people consider eggs and cheese to be BFFs. Instead of cheese--1 oz. of cheddar has 114 calories--top your eggs with hot sauce, which contains no calories and helps with satiety and metabolism. Salsa is also a great option!!  2. Top your toast, waffles or  pancakes with fresh berries instead of jelly or syrup. Half a cup of berries--fresh, frozen or thawed--has about 40 calories, compared with 2 tbsp. of maple syrup or jelly, which both have about 100 calories. The berries will also give you a good punch of fiber, which helps keep you full and satisfied and wont spike blood sugar quickly like the jelly or syrup. 3. Swap the non-fat latte for black coffee with a splash of half-and-half. Contrary to its name, that non-fat latte has 130 calories and a startling 19g of carbohydrates per 16 oz. serving. Replacing that light drinkable dessert with a black coffee with a splash of half-and-half saves you more than 100 calories per 16 oz. serving. 4. Sprinkle salads with freeze-dried raspberries instead of dried cranberries. If you want a sweet addition to your nutritious salad, stay away from dried cranberries. They have a whopping 130 calories per  cup and 30g carbohydrates. Instead, sprinkle freeze-dried raspberries guilt-free and save more than 100 calories per  cup serving, adding 3g of belly-filling fiber. 5. Go for mustard in place of mayo on your sandwich. Mustard can add really nice flavor to any sandwich, and there are tons of varieties, from spicy to honey. A  serving of mayo is 95 calories, versus 10 calories in a serving of mustard.  Or try an avocado mayo spread: You can find the recipe few click this link: https://www.californiaavocado.com/recipes/recipe-container/[place]-avocado-mayo 6. Choose a DIY salad dressing instead of the store-bought kind. Mix Dijon or whole grain mustard with low-fat Kefir or red wine vinegar and garlic. 7. Use hummus as a spread instead of a dip. Use hummus as a spread on a high-fiber cracker or tortilla with a sandwich and save on calories without sacrificing taste. 8. Pick just one salad accessory. Salad isnt automatically a calorie winner. Its easy to over-accessorize with toppings. Instead of topping your  salad with nuts, avocado and cranberries (all three will clock in at 313 calories), just pick one. The next day, choose a different accessory, which will also keep your salad interesting. You dont wear all your jewelry every day, right? 9. Ditch the white pasta in favor of spaghetti squash. One cup of cooked spaghetti squash has about 40 calories, compared with traditional spaghetti, which comes with more than 200. Spaghetti squash is also nutrient-dense. Its a good source of fiber and Vitamins A and C, and it can be eaten just like you would eat pasta--with a great tomato sauce and [place] meatballs or with pesto, tofu and spinach, for example. 10. Dress up your chili, soups and stews with non-fat [place] yogurt instead of sour cream. Just a dollop of sour cream can set you back 115 calories and a whopping 12g of fat--seven of which are of the artery-clogging variety. Added bonus: [place] yogurt is packed with muscle-building protein, calcium and B Vitamins. 11. Mash cauliflower instead of mashed potatoes. One cup of traditional mashed potatoes--in all their creamy goodness--has more than 200 calories, compared to mashed cauliflower, which you can typically eat for less than 100 calories per 1 cup serving. Cauliflower is a great source of the antioxidant indole-3-carbinol (I3C), which may help reduce the risk of some cancers, like breast cancer. 12. Ditch the ice cream sundae in favor of a [place] yogurt parfait. Instead of a cup of ice cream or fro-yo for dessert, try 1 cup of nonfat Greek yogurt topped with fresh berries and a sprinkle of cacao nibs. Both toppings are packed with antioxidants, which can help reduce cellular inflammation and oxidative damage. And the comparison is a no-brainer: One cup of ice cream has about 275 calories; one cup of frozen yogurt has about 230; and a cup of Greek yogurt has just 130, plus twice the protein, so youre less likely to return to the freezer for a second  helping. 13. Put olive oil in a spray container instead of using it directly from the bottle. Each tablespoon of olive oil is 120 calories and 15g of fat. Use a mister instead of pouring it straight into the pan or onto a salad. This allows for portion control and will save you more than 100 calories. 14. When baking, substitute canned pumpkin for butter or oil. Canned pumpkin--not pumpkin pie mix--is loaded with Vitamin A, which is important for skin and eye health, as well as immunity. And the comparisons are pretty crazy:  cup of canned pumpkin has about 40 calories, compared to butter or oil, which has more than 800 calories. Yes, 800 calories. Applesauce and mashed banana can also serve as good substitutions for butter or oil, usually in a 1:1 ratio. 15. Top casseroles with high-fiber cereal instead of breadcrumbs. Breadcrumbs are typically made with white bread, while breakfast cereals contain 5-9g  of fiber per serving. Not only will you save more than 150 calories per  cup serving, the swap will also keep you more full and youll get a metabolism boost from the added fiber. 16. Snack on pistachios instead of macadamia nuts. Believe it or not, you get the same amount of calories from 35 pistachios (100 calories) as you would from only five macadamia nuts. 17. Chow down on kale chips rather than potato chips. This is my favorite dont knock it till you try it swap. Kale chips are so easy to make at home, and you can spice them up with a little grated parmesan or chili powder. Plus, theyre a mere fraction of the calories of potato chips, but with the same crunch factor we crave so often. 18. Add seltzer and some fruit slices to your cocktail instead of soda or fruit juice. One cup of soda or fruit juice can pack on as much as 140 calories. Instead, use seltzer and fruit slices. The fruit provides valuable phytochemicals, such as flavonoids and anthocyanins, which help to combat cancer and stave  off the aging process.

## 2018-02-05 DIAGNOSIS — M25532 Pain in left wrist: Secondary | ICD-10-CM | POA: Diagnosis not present

## 2018-02-05 DIAGNOSIS — M25552 Pain in left hip: Secondary | ICD-10-CM | POA: Diagnosis not present

## 2018-03-13 ENCOUNTER — Encounter: Payer: Self-pay | Admitting: Family Medicine

## 2018-03-13 ENCOUNTER — Ambulatory Visit (INDEPENDENT_AMBULATORY_CARE_PROVIDER_SITE_OTHER): Payer: Medicare Other | Admitting: Family Medicine

## 2018-03-13 VITALS — BP 136/78 | HR 80 | Temp 98.9°F | Ht 63.0 in | Wt 196.6 lb

## 2018-03-13 DIAGNOSIS — R062 Wheezing: Secondary | ICD-10-CM

## 2018-03-13 DIAGNOSIS — R058 Other specified cough: Secondary | ICD-10-CM

## 2018-03-13 DIAGNOSIS — R05 Cough: Secondary | ICD-10-CM

## 2018-03-13 DIAGNOSIS — R52 Pain, unspecified: Secondary | ICD-10-CM

## 2018-03-13 DIAGNOSIS — J310 Chronic rhinitis: Secondary | ICD-10-CM

## 2018-03-13 DIAGNOSIS — J329 Chronic sinusitis, unspecified: Secondary | ICD-10-CM

## 2018-03-13 MED ORDER — ALBUTEROL SULFATE HFA 108 (90 BASE) MCG/ACT IN AERS: 1.0000 | INHALATION_SPRAY | RESPIRATORY_TRACT | 0 refills | Status: DC | PRN

## 2018-03-13 MED ORDER — PREDNISONE 20 MG PO TABS: ORAL_TABLET | ORAL | 0 refills | Status: DC

## 2018-03-13 MED ORDER — AZITHROMYCIN 250 MG PO TABS: 250.0000 mg | ORAL_TABLET | Freq: Every day | ORAL | 0 refills | Status: DC

## 2018-03-13 NOTE — Progress Notes (Signed)
Acute Care Office visit  Assessment and plan:  1. Rhinosinusitis-   greater than 7 days of symptoms and worsening   2. Productive cough   3. Generalized body aches   4. Nocturnal cough with wheeze     - Viral vs Allergic vs Bacterial causes for pt's symptoms reveiwed.     - Antibiotics prescribed today. See med list.  - Steroids prescribed & recommended today.  See med list.  - Bronchodilator inhaler prescribed today PRN.  See med list.  - Supportive care and various OTC medications discussed in addition to any prescribed.  - Call or RTC if new symptoms, or if no improvement or worse over next several days.      Meds ordered this encounter  Medications  . predniSONE (DELTASONE) 20 MG tablet    Sig: Take 3 tabs po * 2 days, then 2 tabs for 2 d, then 1 tab 2 d, then 1/2 tab 2 days.    Dispense:  15 tablet    Refill:  0  . azithromycin (ZITHROMAX) 250 MG tablet    Sig: Take 1 tablet (250 mg total) by mouth daily. Take first 2 tablets together, then 1 every day until finished.    Dispense:  6 tablet    Refill:  0  . albuterol (PROVENTIL HFA;VENTOLIN HFA) 108 (90 Base) MCG/ACT inhaler    Sig: Inhale 1-2 puffs into the lungs every 4 (four) hours as needed for wheezing or shortness of breath.    Dispense:  1 Inhaler    Refill:  0    Patient may or may not pick up    Gross side effects, risk and benefits, and alternatives of medications discussed with patient.  Patient is aware that all medications have potential side effects and we are unable to predict every sideeffect or drug-drug interaction that may occur.  Expresses verbal understanding and consents to current therapy plan and treatment regiment.   Education and routine counseling performed. Handouts provided.  Anticipatory guidance and routine counseling done re: condition, txmnt options and need for follow up. All questions of patient's were answered.  Return if symptoms worsen or fail to improve, for F-up of  current med issues as previously d/c pt.  Please see AVS handed out to patient at the end of our visit for additional patient instructions/ counseling done pertaining to today's office visit.  Note:  This document was partially repared using Dragon voice recognition software and may include unintentional dictation errors.  This document serves as a record of services personally performed by Mellody Dance, DO. It was created on her behalf by Toni Amend, a trained medical scribe. The creation of this record is based on the scribe's personal observations and the provider's statements to them.   I have reviewed the above medical documentation for accuracy and completeness and I concur.  Mellody Dance, DO 03/16/2018 7:34 PM     Subjective:    Chief Complaint  Patient presents with  . Cough    Had a "crazy" Christmas; "and I couldn't rest, I couldn't stop."  HPI:  Pt presents with Sx for at least 7 days- wosening.   C/o:  Coughing primarily.  "Coughed up green crap this morning."  Overall, has been a dry cough until recently.  Now says "I wish I could cough it all up."  "Generally I get bronchitis this time of the year."  Not terrible post-nasal drip; "it's more in the chest."  Feeling wheezy.  "It kinda  seems as though I've got a low grade fever."  Notes her temperature runs lower than 98.6.  Denies:  Denies feeling SOB.    For symptoms patient has tried:  Claritin, Mucinex.  Overall getting:  Now she is "coughing crap up."    Patient Care Team    Relationship Specialty Notifications Start End  Mellody Dance, DO PCP - General Family Medicine  01/29/18   Constance Haw, MD PCP - Cardiology Cardiology Admissions 04/18/17    Comment: Electrophysiologist  Megan Salon, MD Consulting Physician Gynecology  01/29/18   Specialists, Dermatology Consulting Physician Dermatology  01/29/18    Comment: practice where Crista Luria used to work at- goes Gilberto Better, MD Consulting Physician Orthopedic Surgery  01/29/18     Past medical history, Surgical history, Family history reviewed and noted below, Social history, Allergies, and Medications have been entered into the medical record, reviewed and changed as needed.   No Known Allergies  Review of Systems: - see above HPI for pertinent positives General:   No F/C, wt loss Pulm:   No DIB, pleuritic chest pain Card:  No CP, palpitations Abd:  No n/v/d or pain Ext:  No inc edema from baseline   Objective:   Blood pressure 136/78, pulse 80, temperature 98.9 F (37.2 C), height 5\' 3"  (1.6 m), weight 196 lb 9.6 oz (89.2 kg), last menstrual period 03/19/1984, SpO2 98 %. Body mass index is 34.83 kg/m. General: Well Developed, well nourished, appropriate for stated age.  Neuro: Alert and oriented x3, extra-ocular muscles intact, sensation grossly intact.  HEENT: Normocephalic, atraumatic, pupils equal round reactive to light, neck supple, no masses, no painful lymphadenopathy, TM's intact B/L, no acute findings. Nares- patent, clear d/c, OP- clear, mild erythema, No TTP sinuses Skin: Warm and dry, no gross rash. Cardiac: RRR, S1 S2,  no murmurs rubs or gallops.  Respiratory: ECTA B/L and A/P, Not using accessory muscles, speaking in full sentences- unlabored. Vascular:  No gross lower ext edema, cap RF less 2 sec. Psych: No HI/SI, judgement and insight good, Euthymic mood. Full Affect.

## 2018-03-13 NOTE — Patient Instructions (Signed)
You most likely have a viral infection that should resolve on its own over time (or this could be a flare of seasonal allergies as well).   Symptoms for a viral upper respiratory tract infection usually last 3-7 days but can stretch out to 2-3 weeks before you're feeling back to normal.  Your symptoms should not worsen after 7-10 days and if they truely do, please notify our office, as you may need antibiotics.  You can use over-the-counter afrin nasal spray for up to 3 days (NO longer than that) which will help acutely with nasal drainage/ congestion short term.   Also, sterile saline nasal rinses, such as Neil med or AYR sinus rinses, can be very helpful and should be done twice daily- especially throughout the allergy season.   Remember you should use distilled water or previously boiled water to do this.   You can also use an over the counter cold and flu medication such as Tylenol Severe Cold and Sinus/Flu or Dayquil, Nyquil and the like, which will help with cough, congestion, headache/ pain, fevers/chills etc.  Please note, if you being treated for hypertension or have high blood pressure, you should be using the cold meds designated "HBP".    Unfortunately, antibiotics are not helpful for viral infections.  Wash your hands frequently, as you did not want to get those around you sick as well. Never sneeze or cough on others.  And you should not be going to school or work if you are running a temperature of 100.5 or more on two separate occasions.   Drink plenty of fluids and stay hydrated, especially if you are running fevers.  We don't know why, but chicken soup also helps, try it! :) 

## 2018-03-18 NOTE — Progress Notes (Signed)
67 y.o. G1P1 Married White or Caucasian female here for annual exam.  She did have a cardiac ablation this past year and has done really well with this.  Frustrated with weight.  Feels stressors she has is a contributor.  Has a neighbor who was diagnosed with stomach and esophageal cancer.  Another friend was diagnosed with breast cancer.    Has done fine with stopped HRT but she thinks she looks older.    She did start Trazodone this year for chronic insomnia.  Started seeing Dr. Raliegh Scarlet.    Father died this past year.  Mother is failing.  Has fractures three different times last year.  Mother is 99 yo.    PCP:  Dr. Raliegh Scarlet  Patient's last menstrual period was 03/19/1984.          Sexually active: Yes.    The current method of family planning is post menopausal status.    Exercising: No.  The patient does not participate in regular exercise at present. Smoker:  no  Health Maintenance: Pap: 09/20/16 normal  06/03/15 negative, 04/02/14 negative  History of abnormal Pap:  yes MMG:  11/29/16 Density B / Bi-rads 2 benign Colonoscopy:  10/16/13 polyp- repeat 7 years  BMD:    09/24/14 normal- repeat 3-5 years  TDaP:  11/12 Pneumonia vaccine(s):  Completed both vaccines Shingrix: Completed      Hep C testing:06/03/15 Neg Screening Labs: would like to talk to provider, Hb today: , Urine today: none   reports that she has never smoked. She has never used smokeless tobacco. She reports current alcohol use of about 4.0 - 5.0 standard drinks of alcohol per week. She reports that she does not use drugs.  Past Medical History:  Diagnosis Date  . Anemia    borderline  . Atrial fibrillation ([place])    a. s/p ablation on 10/05/2016  . 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 Hypertension     Past Surgical History:  Procedure Laterality Date  . ABLATION OF DYSRHYTHMIC FOCUS  10/05/2016  . ATRIAL FIBRILLATION ABLATION N/A 10/05/2016   Procedure: Atrial Fibrillation  Ablation;  Surgeon: Constance Haw, MD;  Location: [place] [facility];  Service: Cardiovascular;  Laterality: N/A;  . IR RADIOLOGY PERIPHERAL GUIDED IV START  09/28/2016  . IR US GUIDE VASC ACCESS RIGHT  09/28/2016  . RADICAL ABDOMINAL HYSTERECTOMY  1986   with BSO  . TEE WITHOUT CARDIOVERSION N/A 06/07/2016   Procedure: TRANSESOPHAGEAL ECHOCARDIOGRAM (TEE);  Surgeon: Sanda Klein, MD;  Location[facility] ENDOSCOPY;  Service: Cardiovascular;  Laterality: N/A;    Current Outpatient Medications  Medication Sig Dispense Refill  . Cholecalciferol (VITAMIN D3) 125 MCG (5000 UT) CAPS Take 1 capsule by mouth daily.    . citalopram (CELEXA) 20 MG tablet TAKE 1 TABLET DAILY 90 tablet 3  . ELIQUIS 5 MG TABS tablet TAKE 1 TABLET TWICE A DAY. (THIS IS ONE MONTH SUPPLY) 60 tablet 5  . furosemide (LASIX) 20 MG tablet Take 20 mg by mouth daily.    . Multiple Vitamin (MULTIVITAMIN WITH MINERALS) TABS Take 1 tablet by mouth daily.    . nitrofurantoin, macrocrystal-monohydrate, (MACROBID) 100 MG capsule TAKE 1 CAPSULE TWICE DAILY AS NEEDED FOR URINARY TRACTINFECTION 30 capsule 0  . traZODone (DESYREL) 50 MG tablet 0.5-1 tab po q hs prn sleep 90 tablet 0   No current facility-administered medications for this visit.     Family History  Problem Relation Age  of Onset  . Diabetes Mother   . Hypertension Mother   . Lung cancer Mother   . Skin cancer Father   . CVA Father   . Cancer Paternal Grandfather        unknown type    Review of Systems  HENT:       Weight gain   Gastrointestinal: Positive for diarrhea.    Exam:   BP 108/62   Pulse 72   Resp 14   Ht 5\' 3"  (1.6 m)   Wt 190 lb (86.2 kg)   LMP 03/19/1984   BMI 33.66 kg/m    Height: 5\' 3"  (160 cm)  Ht Readings from Last 3 Encounters:  03/24/18 5\' 3"  (1.6 m)  03/13/18 5\' 3"  (1.6 m)  01/29/18 5\' 3"  (1.6 m)    General appearance: alert, cooperative and appears stated age Head: Normocephalic, without obvious abnormality,  atraumatic Neck: no adenopathy, supple, symmetrical, trachea midline and thyroid normal to inspection and palpation Lungs: clear to auscultation bilaterally Breasts: normal appearance, no masses or tenderness Heart: regular rate and rhythm Abdomen: soft, non-tender; bowel sounds normal; no masses,  no organomegaly Extremities: extremities normal, atraumatic, no cyanosis or edema Skin: Skin color, texture, turgor normal. No rashes or lesions Lymph nodes: Cervical, supraclavicular, and axillary nodes normal. No abnormal inguinal nodes palpated Neurologic: Grossly normal   Pelvic: External genitalia:  no lesions              Urethra:  normal appearing urethra with no masses, tenderness or lesions              Bartholins and Skenes: normal                 Vagina: normal appearing vagina with normal color and discharge, no lesions              Cervix: absent              Pap taken: No. Bimanual Exam:  Uterus:  uterus absent              Adnexa: no mass, fullness, tenderness               Rectovaginal: Confirms               Anus:  normal sphincter tone, no lesions  Chaperone was present for exam.  A:  Well Woman with normal exam PMP, off HRT H/o afib, s/p cardiac ablation H/O radical hysterectomy/BSO in 1986 due to cervical cancer Weight gain, BMI 33 Insomnia  P:   Mammogram guidelines reviewed.  Release of records will be signed. pap smear not obtained today RF for Celex 20mg  daily.  RF not needed today. Does not need RF for macrobid Lab work will be done with Dr. Raliegh Scarlet Referral to Dr. Leafy Ro placed today Trial of Align and if this doesn't help, she will follow up with Dr. Collene Mares return annually or prn

## 2018-03-24 ENCOUNTER — Ambulatory Visit (INDEPENDENT_AMBULATORY_CARE_PROVIDER_SITE_OTHER): Payer: Medicare Other | Admitting: Obstetrics & Gynecology

## 2018-03-24 ENCOUNTER — Encounter: Payer: Self-pay | Admitting: Obstetrics & Gynecology

## 2018-03-24 VITALS — BP 108/62 | HR 72 | Resp 14 | Ht 63.0 in | Wt 190.0 lb

## 2018-03-24 DIAGNOSIS — Z6833 Body mass index (BMI) 33.0-33.9, adult: Secondary | ICD-10-CM

## 2018-03-24 DIAGNOSIS — Z124 Encounter for screening for malignant neoplasm of cervix: Secondary | ICD-10-CM | POA: Diagnosis not present

## 2018-03-24 DIAGNOSIS — Z01419 Encounter for gynecological examination (general) (routine) without abnormal findings: Secondary | ICD-10-CM | POA: Diagnosis not present

## 2018-03-24 NOTE — Patient Instructions (Signed)
Probiotic:  Align  Plan to do your bone density with your next mammogram.  Order has been faxed to [facility] for you when it is time to schedule.

## 2018-03-31 ENCOUNTER — Ambulatory Visit: Payer: PRIVATE HEALTH INSURANCE | Admitting: Family Medicine

## 2018-05-06 ENCOUNTER — Other Ambulatory Visit: Payer: Self-pay | Admitting: Cardiology

## 2018-05-06 DIAGNOSIS — I480 Paroxysmal atrial fibrillation: Secondary | ICD-10-CM

## 2018-05-06 NOTE — Telephone Encounter (Signed)
SCr 0/97 in [place] 12/2017, pt on correct dose of Eliquis.

## 2018-05-15 ENCOUNTER — Encounter: Payer: Self-pay | Admitting: Family Medicine

## 2018-05-15 ENCOUNTER — Ambulatory Visit (INDEPENDENT_AMBULATORY_CARE_PROVIDER_SITE_OTHER): Payer: Medicare Other | Admitting: Family Medicine

## 2018-05-15 VITALS — BP 146/92 | HR 73 | Temp 98.9°F | Ht 63.0 in | Wt 192.0 lb

## 2018-05-15 DIAGNOSIS — M791 Myalgia, unspecified site: Secondary | ICD-10-CM

## 2018-05-15 DIAGNOSIS — M25552 Pain in left hip: Secondary | ICD-10-CM

## 2018-05-15 DIAGNOSIS — G8929 Other chronic pain: Secondary | ICD-10-CM | POA: Diagnosis not present

## 2018-05-15 DIAGNOSIS — M25512 Pain in left shoulder: Secondary | ICD-10-CM

## 2018-05-15 DIAGNOSIS — M25511 Pain in right shoulder: Secondary | ICD-10-CM | POA: Diagnosis not present

## 2018-05-15 DIAGNOSIS — M25551 Pain in right hip: Secondary | ICD-10-CM | POA: Diagnosis not present

## 2018-05-15 MED ORDER — PREDNISONE 10 MG PO TABS: ORAL_TABLET | ORAL | 0 refills | Status: DC

## 2018-05-15 MED ORDER — MELOXICAM 7.5 MG PO TABS: 7.5000 mg | ORAL_TABLET | Freq: Every day | ORAL | 0 refills | Status: DC

## 2018-05-15 NOTE — Progress Notes (Signed)
Pt here for an acute care OV today  Impression and Recommendations:    1. Chronic pain of both shoulders   2. Hip pain, bilateral   3. Generalized muscle ache     - Need for lab work in near future. - She will also check her BP at home and bring in BP log next OV in near future to review labs and reck BP  1. Chronic Shoulder & Hip Pain, Bilateral; Generalized Muscle Ache - STRONGLY advised patient to follow up with orthopedics as pt has seen them b-4 for this very issue.  - Advised patient to return to orthopedics to receive another shot or possible MRI or whatever they feel is needed.  - Recommendations of temporary treatment with medication discussed and provided today. - Low-dose steroid taper prescribed today.  - Meloxicam prescribed today for use with acute arthritis pain- further mgt per ortho is they deem necessary/.  - Patient knows to follow up with orthopedics in the meantime.  - Extensive education and health counseling performed.  All questions were answered.  - Reviewed importance of regular physical activity to help alleviate arthritis pain.   Meds ordered this encounter  Medications  . predniSONE (DELTASONE) 10 MG tablet    Sig: Take 1 po per day for 4 days, then 1/2 tab daily for 4 days, then 1/2 tab every other day 4 days    Dispense:  10 tablet    Refill:  0  . meloxicam (MOBIC) 7.5 MG tablet    Sig: Take 1 tablet (7.5 mg total) by mouth daily.    Dispense:  30 tablet    Refill:  0     Education and routine counseling performed. Handouts provided  Patient will call with any questions prior to using medication if they have concerns.    Expresses verbal understanding and consents to current therapy and treatment regimen.  No barriers to understanding were identified.  Red flag symptoms and signs discussed in detail.  Patient expressed understanding regarding what to do in case of emergency\urgent symptoms   Please see AVS handed out to patient  at the end of our visit for further patient instructions/ counseling done pertaining to today's office visit.   Return if symptoms worsen or fail to improve, for F-up of current med issues as previously d/c pt.     Note:  This document was prepared occasionally using Dragon voice recognition software and may include unintentional dictation errors in addition to a scribe.  This document serves as a record of services personally performed by Mellody Dance, DO. It was created on her behalf by Toni Amend, a trained medical scribe. The creation of this record is based on the scribe's personal observations and the provider's statements to them.   I have reviewed the above medical documentation for accuracy and completeness and I concur.  Mellody Dance, DO 05/20/2018 2:07 PM      -----------------------------------------------------------------------------------------------------------------------------    Subjective:    CC:  Chief Complaint  Patient presents with  . Shoulder Pain  . Hip Pain    HPI: Savannah Hill is a 68 y.o. female who presents to [place] at [facility] today for issues as discussed below.  Aches & Pains States "I've self-diagnosed myself."  Says "I have polymyalgia."  Says she's had shoulder pain in her left shoulder for at least four months, if not more.  She went to her orthopedic doctor, through [facility], and they did  an X-ray.  Notes "there's no arthritis."  She was given a cortisone shot, and this did not help.  She did not follow up with orthopedics when her shoulder did not improve.  Every time she tried to pick something up with her left arm, notes she felt pain radiating, sometimes up to her neck.  She tried CBD oil, went for a massage, and is now trying veterinary lineament.  None of these things have provided relief.  Her other shoulder is also now sore, and her hips are sore.  Notes she's had trouble in her bursa  before.  States she is stiff when she gets up in the morning, and can hardly get up and down the stairs.  Patient has been taking tylenol for her pain.  She has not been to physical therapy in a very long time.  Notes her hip pain doesn't necessarily improve when she's moving.  Her pain does not improve with exercise.  As soon as she sits down, "I can barely get up because the pain is so horrible back here."  She indicates her thighs.  Notes it's also hard to walk.  She is supposed to go for a bone density test in the near future.  Patient notes she has not been exercising for the past two weeks.      Wt Readings from Last 3 Encounters:  05/15/18 192 lb (87.1 kg)  03/24/18 190 lb (86.2 kg)  03/13/18 196 lb 9.6 oz (89.2 kg)   BP Readings from Last 3 Encounters:  05/19/18 (!) 146/92  03/24/18 108/62  03/13/18 136/78   BMI Readings from Last 3 Encounters:  05/15/18 34.01 kg/m  03/24/18 33.66 kg/m  03/13/18 34.83 kg/m     Patient Care Team    Relationship Specialty Notifications Start End  Mellody Dance, DO PCP - General Family Medicine  01/29/18   Constance Haw, MD PCP - Cardiology Cardiology Admissions 04/18/17    Comment: Electrophysiologist  Megan Salon, MD Consulting Physician Gynecology  01/29/18   Specialists, Dermatology Consulting Physician Dermatology  01/29/18    Comment: practice where Crista Luria used to work at- goes Gilberto Better, MD Consulting Physician Orthopedic Surgery  01/29/18   [facility]  05/15/18    Comment: sees for shoulder and hip pain     Patient Active Problem List   Diagnosis Date Noted  . Essential hypertension 01/29/2018    Priority: High  . Mood disorder - mixed depression and anxiety 01/29/2018    Priority: High  . Paroxysmal atrial fibrillation ([place]) 10/05/2016    Priority: High  . h/o Cervical cancer 10/01/2011    Priority: High  . H/O total hysterectomy 10/01/2011    Priority: High  .  Primary insomnia 01/29/2018    Priority: Medium  . Mitral valve disease     Priority: Medium  . LAFB (left anterior fascicular block) 07/01/2013    Priority: Medium  . Alcohol abuse 10/01/2011    Priority: Medium  . Inactivity 01/29/2018    Priority: Low  . Generalized muscle ache 05/15/2018  . Hip pain, bilateral 05/15/2018  . Chronic pain of both shoulders 05/15/2018  . Attention deficit hyperactivity disorder (ADHD) 01/29/2018  . Excessive drinking of alcohol 01/29/2018  . Encounter for long-term (current) use of high-risk medication 01/29/2018  . Health education/counseling 01/29/2018  . Current use of long term anticoagulation 10/06/2016  . PAF (paroxysmal atrial fibrillation) ([place]) 07/01/2013  . Lymphedema 07/01/2013  . Chest pain at  rest 10/01/2011  . Leg swelling 10/01/2011      Past Medical History:  Diagnosis Date  . Anemia    borderline  . Atrial fibrillation ([place])    a. s/p ablation on 10/05/2016  . Cancer [facility])    cervical/rad hysterectomy/bso wiht chemo for small cell ca  . Hypertension      Past Surgical History:  Procedure Laterality Date  . ABLATION OF DYSRHYTHMIC FOCUS  10/05/2016  . ATRIAL FIBRILLATION ABLATION N/A 10/05/2016   Procedure: Atrial Fibrillation Ablation;  Surgeon: Constance Haw, MD;  Location: [place] [facility];  Service: Cardiovascular;  Laterality: N/A;  . IR RADIOLOGY PERIPHERAL GUIDED IV START  09/28/2016  . IR US GUIDE VASC ACCESS RIGHT  09/28/2016  . RADICAL ABDOMINAL HYSTERECTOMY  1986   with BSO  . TEE WITHOUT CARDIOVERSION N/A 06/07/2016   Procedure: TRANSESOPHAGEAL ECHOCARDIOGRAM (TEE);  Surgeon: Sanda Klein, MD;  Location: [facility];  Service: Cardiovascular;  Laterality: N/A;     Family History  Problem Relation Age of Onset  . Diabetes Mother   . Hypertension Mother   . Lung cancer Mother   . Skin cancer Father   . CVA Father   . Cancer Paternal Grandfather        unknown type     Social History    Socioeconomic History  . Marital status: Married    Spouse name: Not on file  . Number of children: Not on file  . Years of education: Not on file  . Highest education level: Not on file  Occupational History  . Not on file  Social Needs  . Financial resource strain: Not on file  . Food insecurity:    Worry: Not on file    Inability: Not on file  . Transportation needs:    Medical: Not on file    Non-medical: Not on file  Tobacco Use  . Smoking status: Never Smoker  . Smokeless tobacco: Never Used  Substance and Sexual Activity  . Alcohol use: Yes    Alcohol/week: 4.0 - 5.0 standard drinks    Types: 4 - 5 Standard drinks or equivalent per week  . Drug use: No  . Sexual activity: Yes    Partners: Male    Birth control/protection: Surgical  Lifestyle  . Physical activity:    Days per week: Not on file    Minutes per session: Not on file  . Stress: Not on file  Relationships  . Social connections:    Talks on phone: Not on file    Gets together: Not on file    Attends religious service: Not on file    Active member of club or organization: Not on file    Attends meetings of clubs or organizations: Not on file    Relationship status: Not on file  . Intimate partner violence:    Fear of current or ex partner: Not on file    Emotionally abused: Not on file    Physically abused: Not on file    Forced sexual activity: Not on file  Other Topics Concern  . 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 apixaban (ELIQUIS) 5 MG TABS tablet Take 1 tablet (5 mg total) by mouth 2 (two) times daily.  . Cholecalciferol (VITAMIN D3) 125 MCG (5000 UT) CAPS Take 1  capsule by mouth daily.  . citalopram (CELEXA) 20 MG tablet TAKE 1 TABLET DAILY  . furosemide (LASIX) 20 MG tablet Take 20 mg by mouth daily.  . Multiple Vitamin (MULTIVITAMIN WITH MINERALS) TABS Take 1  tablet by mouth daily.  . nitrofurantoin, macrocrystal-monohydrate, (MACROBID) 100 MG capsule TAKE 1 CAPSULE TWICE DAILY AS NEEDED FOR URINARY TRACTINFECTION  . traZODone (DESYREL) 50 MG tablet 0.5-1 tab po q hs prn sleep    Allergies:  No Known Allergies   Review of Systems: General:   Denies fever, chills, unexplained weight loss.  Optho/Auditory:   Denies visual changes, blurred vision/LOV Respiratory:   Denies wheeze, DOE more than baseline levels.   Cardiovascular:   Denies chest pain, palpitations, new onset peripheral edema  Gastrointestinal:   Denies nausea, vomiting, diarrhea, abd pain.  Genitourinary: Denies dysuria, freq/ urgency, flank pain or discharge from genitals.  Endocrine:     Denies hot or cold intolerance, polyuria, polydipsia. Musculoskeletal:   Denies unexplained myalgias, joint swelling, unexplained arthralgias, gait problems.  Skin:  Denies new onset rash, suspicious lesions Neurological:     Denies dizziness, unexplained weakness, numbness  Psychiatric/Behavioral:   Denies mood changes, suicidal or homicidal ideations, hallucinations    Objective:   Blood pressure (!) 146/92, pulse 73, temperature 98.9 F (37.2 C), height 5\' 3"  (1.6 m), weight 192 lb (87.1 kg), last menstrual period 03/19/1984, SpO2 98 %. Body mass index is 34.01 kg/m. General:  Well Developed, well nourished, appropriate for stated age.  Neuro:  Alert and oriented,  extra-ocular muscles intact  HEENT:  Normocephalic, atraumatic, neck supple Skin:  no gross rash, warm, pink. Cardiac:  RRR, S1 S2 Respiratory:  ECTA B/L and A/P, Not using accessory muscles, speaking in full sentences- unlabored. Vascular:  Ext warm, no cyanosis apprec.; cap RF less 2 sec. Psych:  No HI/SI, judgement and insight good, Euthymic mood. Full Affect.

## 2018-05-15 NOTE — Patient Instructions (Signed)
Come in for fasting bldwrk tom am  - call your ortho doc for appt.   Lenna Sciara, please make sure full set fasting labs in compuiter. [facility]!

## 2018-05-16 ENCOUNTER — Other Ambulatory Visit: Payer: Medicare Other

## 2018-05-16 ENCOUNTER — Other Ambulatory Visit: Payer: Self-pay

## 2018-05-16 DIAGNOSIS — Z0000 Encounter for general adult medical examination without abnormal findings: Secondary | ICD-10-CM

## 2018-05-16 DIAGNOSIS — F1010 Alcohol abuse, uncomplicated: Secondary | ICD-10-CM

## 2018-05-16 DIAGNOSIS — I10 Essential (primary) hypertension: Secondary | ICD-10-CM

## 2018-05-16 DIAGNOSIS — Z7901 Long term (current) use of anticoagulants: Secondary | ICD-10-CM

## 2018-05-16 NOTE — Progress Notes (Signed)
c 

## 2018-05-17 LAB — COMPREHENSIVE METABOLIC PANEL
ALT: 5 IU/L (ref 0–32)
AST: 20 IU/L (ref 0–40)
Albumin/Globulin Ratio: 1.8 (ref 1.2–2.2)
Albumin: 4.2 g/dL (ref 3.8–4.8)
Alkaline Phosphatase: 67 IU/L (ref 39–117)
BUN / CREAT RATIO: 15 (ref 12–28)
BUN: 14 mg/dL (ref 8–27)
Bilirubin Total: 0.6 mg/dL (ref 0.0–1.2)
CALCIUM: 9.7 mg/dL (ref 8.7–10.3)
CO2: 25 mmol/L (ref 20–29)
CREATININE: 0.96 mg/dL (ref 0.57–1.00)
Chloride: 101 mmol/L (ref 96–106)
GFR calc Af Amer: 71 mL/min/{1.73_m2} (ref >59)
GFR, EST NON AFRICAN AMERICAN: 61 mL/min/{1.73_m2} (ref >59)
GLOBULIN, TOTAL: 2.3 g/dL (ref 1.5–4.5)
GLUCOSE: 91 mg/dL (ref 65–99)
Potassium: 4 mmol/L (ref 3.5–5.2)
SODIUM: 142 mmol/L (ref 134–144)
TOTAL PROTEIN: 6.5 g/dL (ref 6.0–8.5)

## 2018-05-17 LAB — CBC WITH DIFFERENTIAL/PLATELET
BASOS: 1 %
Basophils Absolute: 0 10*3/uL (ref 0.0–0.2)
EOS (ABSOLUTE): 0.1 10*3/uL (ref 0.0–0.4)
EOS: 2 %
HEMATOCRIT: 38.6 % (ref 34.0–46.6)
Hemoglobin: 13.3 g/dL (ref 11.1–15.9)
IMMATURE GRANULOCYTES: 0 %
Immature Grans (Abs): 0 10*3/uL (ref 0.0–0.1)
LYMPHS ABS: 1.6 10*3/uL (ref 0.7–3.1)
Lymphs: 30 %
MCH: 32.3 pg (ref 26.6–33.0)
MCHC: 34.5 g/dL (ref 31.5–35.7)
MCV: 94 fL (ref 79–97)
MONOCYTES: 11 %
MONOS ABS: 0.6 10*3/uL (ref 0.1–0.9)
Neutrophils Absolute: 2.9 10*3/uL (ref 1.4–7.0)
Neutrophils: 56 %
Platelets: 370 10*3/uL (ref 150–450)
RBC: 4.12 x10E6/uL (ref 3.77–5.28)
RDW: 12.4 % (ref 11.7–15.4)
WBC: 5.2 10*3/uL (ref 3.4–10.8)

## 2018-05-17 LAB — T4, FREE: FREE T4: 1.01 ng/dL (ref 0.82–1.77)

## 2018-05-17 LAB — LIPID PANEL
CHOL/HDL RATIO: 3.7 ratio (ref 0.0–4.4)
CHOLESTEROL TOTAL: 206 mg/dL — AB (ref 100–199)
HDL: 55 mg/dL (ref >39)
LDL Calculated: 130 mg/dL — ABNORMAL HIGH (ref 0–99)
TRIGLYCERIDES: 104 mg/dL (ref 0–149)
VLDL Cholesterol Cal: 21 mg/dL (ref 5–40)

## 2018-05-17 LAB — HEMOGLOBIN A1C
Est. average glucose Bld gHb Est-mCnc: 108 mg/dL
Hgb A1c MFr Bld: 5.4 % (ref 4.8–5.6)

## 2018-05-17 LAB — VITAMIN D 25 HYDROXY (VIT D DEFICIENCY, FRACTURES): Vit D, 25-Hydroxy: 73.5 ng/mL (ref 30.0–100.0)

## 2018-05-17 LAB — TSH: TSH: 3.03 u[IU]/mL (ref 0.450–4.500)

## 2018-05-17 LAB — T3: T3 TOTAL: 103 ng/dL (ref 71–180)

## 2018-05-22 ENCOUNTER — Encounter (INDEPENDENT_AMBULATORY_CARE_PROVIDER_SITE_OTHER): Payer: Medicare Other

## 2018-05-22 ENCOUNTER — Telehealth: Payer: Self-pay | Admitting: Family Medicine

## 2018-05-22 NOTE — Telephone Encounter (Signed)
Patient states medical assistant left message fr her to call office for results-- she will be in&out all day but will try to call Melissa back in late afternoon (around 3-3:30pm)--glh

## 2018-05-22 NOTE — Telephone Encounter (Signed)
Noted MPulliam, CMA/RT(R)  

## 2018-05-25 ENCOUNTER — Encounter: Payer: Self-pay | Admitting: Family Medicine

## 2018-05-27 ENCOUNTER — Telehealth: Payer: Self-pay | Admitting: Family Medicine

## 2018-05-27 ENCOUNTER — Other Ambulatory Visit: Payer: Self-pay

## 2018-05-27 DIAGNOSIS — F5101 Primary insomnia: Secondary | ICD-10-CM

## 2018-05-27 MED ORDER — TRAZODONE HCL 50 MG PO TABS: ORAL_TABLET | ORAL | 0 refills | Status: DC

## 2018-05-27 MED ORDER — FUROSEMIDE 20 MG PO TABS: 20.0000 mg | ORAL_TABLET | Freq: Every day | ORAL | 0 refills | Status: DC

## 2018-05-27 NOTE — Telephone Encounter (Signed)
Patient requesting refill on lasix.  Medication last filled by previous provider. LOV 05/15/2018. Please review and advise. MPulliam, CMA/RT(R)

## 2018-05-27 NOTE — Telephone Encounter (Signed)
Patient called for refill on Rx that was originally prescribed by prior PCP:  furosemide (LASIX) 20 MG tablet [[id number]]   Order Details  Dose: 20 mg Route: Oral Frequency: Daily  Dispense Quantity: -- Refills: -- Fills remaining: --        Sig: Take 20 mg by mouth daily.           ---Forwarding request to medical assistant that if approved to send refill order to :   [facility] [place] [place] [facility] [phone] (Phone) [phone] (Fax)   --Dion Body

## 2018-05-27 NOTE — Telephone Encounter (Signed)
Patient request 3 month / 90days supply on the Lasix frm mail order pharmacy.  furosemide (LASIX) 20 MG tablet [[id number]]   Order Details  Dose: 20 mg Route: Oral Frequency: Daily  Dispense Quantity: -- Refills: -- Fills remaining: --        Sig: Take 20 mg by mouth daily.       Written Date: -- Expiration Date: -- Ordering Date: 10/04/16   Start Date: -- End Date: --         Ordering Provider: -- DEA #:  -- NPI:  --   Authorizing Provider: [provider] DEA #:  -- NPI:  [id number]   Ordering User:  Cassell Smiles, CPhT       Pls send refill to:   [facility] [place] [place] [facility] [phone] (Phone) [phone] (Fax)

## 2018-05-28 ENCOUNTER — Other Ambulatory Visit: Payer: Self-pay

## 2018-05-28 ENCOUNTER — Ambulatory Visit (INDEPENDENT_AMBULATORY_CARE_PROVIDER_SITE_OTHER): Payer: Medicare Other | Admitting: Family Medicine

## 2018-05-28 ENCOUNTER — Encounter (INDEPENDENT_AMBULATORY_CARE_PROVIDER_SITE_OTHER): Payer: Self-pay | Admitting: Family Medicine

## 2018-05-28 VITALS — BP 114/78 | HR 75 | Ht 63.0 in | Wt 188.0 lb

## 2018-05-28 DIAGNOSIS — I4891 Unspecified atrial fibrillation: Secondary | ICD-10-CM

## 2018-05-28 DIAGNOSIS — R5383 Other fatigue: Secondary | ICD-10-CM

## 2018-05-28 DIAGNOSIS — Z0289 Encounter for other administrative examinations: Secondary | ICD-10-CM

## 2018-05-28 DIAGNOSIS — R739 Hyperglycemia, unspecified: Secondary | ICD-10-CM | POA: Diagnosis not present

## 2018-05-28 DIAGNOSIS — Z6833 Body mass index (BMI) 33.0-33.9, adult: Secondary | ICD-10-CM

## 2018-05-28 DIAGNOSIS — E669 Obesity, unspecified: Secondary | ICD-10-CM

## 2018-05-28 DIAGNOSIS — Z1331 Encounter for screening for depression: Secondary | ICD-10-CM | POA: Diagnosis not present

## 2018-05-28 DIAGNOSIS — M25512 Pain in left shoulder: Secondary | ICD-10-CM | POA: Diagnosis not present

## 2018-05-28 DIAGNOSIS — R0602 Shortness of breath: Secondary | ICD-10-CM

## 2018-05-28 NOTE — Progress Notes (Signed)
.  Office: [phone]  /  Fax: [phone]   HPI:   Chief Complaint: OBESITY  Savannah Hill (MR# [id number]) is a 68 y.o. female who presents on 05/28/2018 for obesity evaluation and treatment. Current BMI is Body mass index is 33.3 kg/m.   Savannah Hill has struggled with obesity for years and has been unsuccessful in either losing weight or maintaining long term weight loss. Savannah Hill attended our information session and states she is currently in the action stage of change and ready to dedicate time achieving and maintaining a healthier weight.   Savannah Hill states her family eats meals together she thinks her family will eat healthier with her her desired weight loss is 28 lbs she started gaining weight when she moved to the [place] her heaviest weight ever was 188 lbs she skips meals frequently she is frequently drinking liquids with calories   Fatigue Savannah Hill feels her energy is lower than it should be. This has worsened with weight gain and has not worsened recently. Savannah Hill denies daytime somnolence and denies waking up still tired. Patient is at risk for obstructive sleep apnea. Patient generally gets 8 or 9 hours of sleep per night, and states they generally have generally restful sleep. Snoring is not present. Apneic episodes are not present. Epworth Sleepiness Score is 1.  Dyspnea on exertion Savannah Hill notes increasing shortness of breath with exercising and seems to be worsening over time with weight gain. She notes getting out of breath sooner with activity than she used to. This has not gotten worse recently. Savannah Hill denies orthopnea.  Hyperglycemia Savannah Hill has had an elevated blood glucose readings in the past without a diagnosis of diabetes. She notes polyphagia with weight gain.  Atrial Fibrillation Savannah Hill is on Eliquis and states that the atrial fibrillation has resolved after ablation, but her cardiologist tells her she will be on Eliquis the rest of her life.  Depression Screen Savannah Hill's  Food and Mood (modified PHQ-9) score was 2. Depression screen [facility] 2/9 05/28/2018  Decreased Interest 0  Down, Depressed, Hopeless 0  PHQ - 2 Score 0  Altered sleeping 1  Tired, decreased energy 1  Change in appetite 0  Feeling bad or failure about yourself  0  Trouble concentrating 0  Moving slowly or fidgety/restless 0  Suicidal thoughts 0  PHQ-9 Score 2  Difficult doing work/chores Not difficult at all   ASSESSMENT AND PLAN:  Other fatigue - Plan: EKG 12-Lead  Shortness of breath on exertion  Hyperglycemia - Plan: Comprehensive metabolic panel, Insulin, random  Atrial fibrillation, unspecified type (HCC)  Depression screening  Class 1 obesity with serious comorbidity and body mass index (BMI) of 33.0 to 33.9 in adult, unspecified obesity type  PLAN:  Fatigue Savannah Hill was informed that her fatigue may be related to obesity, depression or many other causes. Labs will be ordered, and in the meanwhile Savannah Hill has agreed to work on diet, exercise and weight loss to help with fatigue. Proper sleep hygiene was discussed including the need for 7-8 hours of quality sleep each night. A sleep study was not ordered based on symptoms and Epworth score. An EKG and an indirect calorimetry was ordered today and [name] agrees to follow up in 2 weeks.  Dyspnea on exertion Savannah Hill's shortness of breath appears to be obesity related and exercise induced. She has agreed to work on weight loss and gradually increase exercise to treat her exercise induced shortness of breath. If [name] follows our instructions and loses weight without improvement of her  shortness of breath, we will plan to refer to pulmonology. We will monitor this condition regularly. Manie agrees to this plan.  Hyperglycemia Fasting labs will be obtained and results with be discussed with Savannah Hill in 2 weeks at her follow up visit. In the meanwhile Savannah Hill was started on a lower simple carbohydrate diet and will work on weight loss efforts.  Savannah Hill will follow up in 2 weeks.  Atrial Fibrillation [name] will work on weight loss to help decrease stress on her heart and the risk of exacerbation. She agrees with this plan and will follow up at the agreed upon time.  Depression Screen Savannah Hill had a negative depression screening. Depression is commonly associated with obesity and often results in emotional eating behaviors. We will monitor this closely and work on CBT to help improve the non-hunger eating patterns. Referral to Psychology may be required if no improvement is seen as she continues in our clinic.  Obesity Savannah Hill is currently in the action stage of change and her goal is to continue with weight loss efforts. She has agreed to follow the Category 2 plan. Savannah Hill has been instructed to exercise 3 days a week with 1 mile on the treadmill. We discussed the following Behavioral Modification Strategies today: increasing lean protein intake, decreasing simple carbohydrates, and work on meal planning and easy cooking plans.  Savannah Hill has agreed to follow up with our clinic in 2 weeks. She was informed of the importance of frequent follow up visits to maximize her success with intensive lifestyle modifications for her multiple health conditions. She was informed we would discuss her lab results at her next visit unless there is a critical issue that needs to be addressed sooner. Savannah Hill agreed to keep her next visit at the agreed upon time to discuss these results.  ALLERGIES: No Known Allergies  MEDICATIONS: Current Outpatient Medications on File Prior to Visit  Medication Sig Dispense Refill  . apixaban (ELIQUIS) 5 MG TABS tablet Take 1 tablet (5 mg total) by mouth 2 (two) times daily. 60 tablet 5  . Cholecalciferol (VITAMIN D3) 125 MCG (5000 UT) CAPS Take 1 capsule by mouth daily.    . citalopram (CELEXA) 20 MG tablet TAKE 1 TABLET DAILY 90 tablet 3  . docusate sodium (COLACE) 100 MG capsule Take 100 mg by mouth daily.    . furosemide  (LASIX) 20 MG tablet Take 1 tablet (20 mg total) by mouth daily. 90 tablet 0  . meloxicam (MOBIC) 7.5 MG tablet Take 1 tablet (7.5 mg total) by mouth daily. 30 tablet 0  . Multiple Vitamin (MULTIVITAMIN WITH MINERALS) TABS Take 1 tablet by mouth daily.    . predniSONE (DELTASONE) 10 MG tablet Take 1 po per day for 4 days, then 1/2 tab daily for 4 days, then 1/2 tab every other day 4 days 10 tablet 0  . traZODone (DESYREL) 50 MG tablet 0.5-1 tab po q hs prn sleep 90 tablet 0   No current facility-administered medications on file prior to visit.     PAST MEDICAL HISTORY: Past Medical History:  Diagnosis Date  . Anemia    borderline  . Atrial fibrillation ([place])    a. s/p ablation on 10/05/2016  . Cancer [facility])    cervical/rad hysterectomy/bso wiht chemo for small cell ca  . Dyspnea   . Heart valve disorder   . HLD (hyperlipidemia)   . Hypertension   . Joint pain   . Lower extremity edema   . Obesity   . Palpitations  PAST SURGICAL HISTORY: Past Surgical History:  Procedure Laterality Date  . ABLATION OF DYSRHYTHMIC FOCUS  10/05/2016  . ATRIAL FIBRILLATION ABLATION N/A 10/05/2016   Procedure: Atrial Fibrillation Ablation;  Surgeon: Constance Haw, MD;  Location: [place] [facility];  Service: Cardiovascular;  Laterality: N/A;  . IR RADIOLOGY PERIPHERAL GUIDED IV START  09/28/2016  . IR US GUIDE VASC ACCESS RIGHT  09/28/2016  . RADICAL ABDOMINAL HYSTERECTOMY  1986   with BSO  . TEE WITHOUT CARDIOVERSION N/A 06/07/2016   Procedure: TRANSESOPHAGEAL ECHOCARDIOGRAM (TEE);  Surgeon: Sanda Klein, MD;  Location: [facility] ENDOSCOPY;  Service: Cardiovascular;  Laterality: N/A;    SOCIAL HISTORY: Social History   Tobacco Use  . Smoking status: Never Smoker  . Smokeless tobacco: Never Used  Substance Use Topics  . Alcohol use: Yes    Alcohol/week: 4.0 - 5.0 standard drinks    Types: 4 - 5 Standard drinks or equivalent per week  . Drug use: No    FAMILY HISTORY: Family History   Problem Relation Age of Onset  . Diabetes Mother   . Hypertension Mother   . Lung cancer Mother   . Heart disease Mother   . Thyroid disease Mother   . Depression Mother   . Alcoholism Mother   . Skin cancer Father   . CVA Father   . Diabetes Father   . Hypertension Father   . Cancer Paternal Grandfather        unknown type   ROS: Review of Systems  Constitutional: Positive for malaise/fatigue. Negative for weight loss.  Respiratory: Positive for shortness of breath.   Cardiovascular: Negative for orthopnea.  Musculoskeletal: Positive for joint pain and myalgias.       Positive for neck stiffness. Positive for muscle stiffness.  Endo/Heme/Allergies: Bruises/bleeds easily.       Positive for hyperglycemia. Positive for polyphagia.  Psychiatric/Behavioral: The patient has insomnia.    PHYSICAL EXAM: Blood pressure 114/78, pulse 75, height 5\' 3"  (1.6 m), weight 188 lb (85.3 kg), last menstrual period 03/19/1984, SpO2 99 %. Body mass index is 33.3 kg/m. Physical Exam Vitals signs reviewed.  Constitutional:      Appearance: Normal appearance. She is obese.  HENT:     Head: Normocephalic and atraumatic.     Nose: Nose normal.  Eyes:     General: No scleral icterus.    Extraocular Movements: Extraocular movements intact.  Neck:     Musculoskeletal: Normal range of motion and neck supple.     Thyroid: No thyromegaly.     Comments: Negative for thyromegaly. Cardiovascular:     Rate and Rhythm: Normal rate and regular rhythm.  Pulmonary:     Effort: Pulmonary effort is normal. No respiratory distress.  Abdominal:     Palpations: Abdomen is soft.     Tenderness: There is no abdominal tenderness.     Comments: Positive for obesity.  Musculoskeletal:     Comments: ROM normal in all extremities.  Skin:    General: Skin is warm and dry.  Neurological:     Mental Status: She is alert and oriented to person, place, and time.     Coordination: Coordination normal.   Psychiatric:        Mood and Affect: Mood normal.        Behavior: Behavior normal.    RECENT LABS AND TESTS: BMET    Component Value Date/Time   NA 142 05/16/2018 1021   K 4.0 05/16/2018 1021   CL 101 05/16/2018  1021   CO2 25 05/16/2018 1021   GLUCOSE 91 05/16/2018 1021   GLUCOSE 76 12/19/2015 1524   BUN 14 05/16/2018 1021   CREATININE 0.96 05/16/2018 1021   CREATININE 0.96 12/19/2015 1524   CALCIUM 9.7 05/16/2018 1021   GFRNONAA 61 05/16/2018 1021   GFRAA 71 05/16/2018 1021   Lab Results  Component Value Date   HGBA1C 5.4 05/16/2018   No results found for: INSULIN CBC    Component Value Date/Time   WBC 5.2 05/16/2018 1021   WBC 10.1 07/01/2015 1143   RBC 4.12 05/16/2018 1021   RBC 4.08 07/01/2015 1143   HGB 13.3 05/16/2018 1021   HGB 12.7 03/02/2013 1530   HCT 38.6 05/16/2018 1021   PLT 370 05/16/2018 1021   MCV 94 05/16/2018 1021   MCH 32.3 05/16/2018 1021   MCH 31.1 07/01/2015 1143   MCHC 34.5 05/16/2018 1021   MCHC 32.9 07/01/2015 1143   RDW 12.4 05/16/2018 1021   LYMPHSABS 1.6 05/16/2018 1021   MONOABS 1,010 (H) 07/01/2015 1143   EOSABS 0.1 05/16/2018 1021   BASOSABS 0.0 05/16/2018 1021   Iron/TIBC/Ferritin/ %Sat No results found for: IRON, TIBC, FERRITIN, IRONPCTSAT Lipid Panel     Component Value Date/Time   CHOL 206 (H) 05/16/2018 1021   TRIG 104 05/16/2018 1021   HDL 55 05/16/2018 1021   CHOLHDL 3.7 05/16/2018 1021   CHOLHDL 4.8 06/03/2015 1403   VLDL 21 06/03/2015 1403   LDLCALC 130 (H) 05/16/2018 1021   Hepatic Function Panel     Component Value Date/Time   PROT 6.5 05/16/2018 1021   ALBUMIN 4.2 05/16/2018 1021   AST 20 05/16/2018 1021   ALT 5 05/16/2018 1021   ALKPHOS 67 05/16/2018 1021   BILITOT 0.6 05/16/2018 1021   BILIDIR 0.0 02/22/2014 1417      Component Value Date/Time   TSH 3.030 05/16/2018 1021   Results for KYMBERLY, BLOMBERG (MRN [id number]) as of 05/28/2018 12:23  Ref. Range 05/16/2018 10:21  Vitamin D, 25-Hydroxy  Latest Ref Range: 30.0 - 100.0 ng/mL 73.5   ECG  shows NSR with a rate of 76 BPM. INDIRECT CALORIMETER done today shows a VO2 of 200 and a REE of 1394. Her calculated basal metabolic rate is 4627 thus her basal metabolic rate is worse than expected.  OBESITY BEHAVIORAL INTERVENTION VISIT  Today's visit was # 1   Starting weight: 188 lbs Starting date: 05/28/2018 Today's weight : Weight: 188 lb (85.3 kg)  Today's date: 05/28/2018 Total lbs lost to date: 0 At least 15 minutes were spent on discussing the following behavioral intervention visit.    05/28/2018  Height 5\' 3"  (1.6 m)  Weight 188 lb (85.3 kg)  BMI (Calculated) 33.31  BLOOD PRESSURE - SYSTOLIC 035  BLOOD PRESSURE - DIASTOLIC 78  Waist Measurement  40 inches   Body Fat % 45.3 %  Total Body Water (lbs) 81.6 lbs  RMR 1394   ASK: We discussed the diagnosis of obesity with [name] today and [place] agreed to give [place] permission to discuss obesity behavioral modification therapy today.  ASSESS: Kaye has the diagnosis of obesity and her BMI today is 33.31. Rosemarie is in the action stage of change.   ADVISE: Chihiro was educated on the multiple health risks of obesity as well as the benefit of weight loss to improve her health. She was advised of the need for long term treatment and the importance of lifestyle modifications to improve her current health and  to decrease her risk of future health problems.  AGREE: Multiple dietary modification options and treatment options were discussed and Chace agreed to follow the recommendations documented in the above note.  ARRANGE: Tammala was educated on the importance of frequent visits to treat obesity as outlined per CMS and USPSTF guidelines and agreed to schedule her next follow up appointment today.   IMarcille Blanco, CMA, am acting as transcriptionist for Starlyn Skeans, MD   I have reviewed the above documentation for accuracy and completeness, and I agree with the  above. -Dennard Nip, MD

## 2018-05-29 LAB — COMPREHENSIVE METABOLIC PANEL
A/G RATIO: 1.9 (ref 1.2–2.2)
ALT: 10 IU/L (ref 0–32)
AST: 22 IU/L (ref 0–40)
Albumin: 4.7 g/dL (ref 3.8–4.8)
Alkaline Phosphatase: 70 IU/L (ref 39–117)
BUN/Creatinine Ratio: 15 (ref 12–28)
BUN: 13 mg/dL (ref 8–27)
Bilirubin Total: 0.6 mg/dL (ref 0.0–1.2)
CALCIUM: 10.3 mg/dL (ref 8.7–10.3)
CO2: 25 mmol/L (ref 20–29)
CREATININE: 0.88 mg/dL (ref 0.57–1.00)
Chloride: 98 mmol/L (ref 96–106)
GFR calc Af Amer: 79 mL/min/{1.73_m2} (ref >59)
GFR, EST NON AFRICAN AMERICAN: 68 mL/min/{1.73_m2} (ref >59)
Globulin, Total: 2.5 g/dL (ref 1.5–4.5)
Glucose: 98 mg/dL (ref 65–99)
POTASSIUM: 4.6 mmol/L (ref 3.5–5.2)
Sodium: 141 mmol/L (ref 134–144)
Total Protein: 7.2 g/dL (ref 6.0–8.5)

## 2018-05-29 LAB — INSULIN, RANDOM: INSULIN: 11.4 u[IU]/mL (ref 2.6–24.9)

## 2018-06-11 ENCOUNTER — Encounter (INDEPENDENT_AMBULATORY_CARE_PROVIDER_SITE_OTHER): Payer: Self-pay

## 2018-06-11 ENCOUNTER — Ambulatory Visit (INDEPENDENT_AMBULATORY_CARE_PROVIDER_SITE_OTHER): Payer: Medicare Other | Admitting: Family Medicine

## 2018-06-11 ENCOUNTER — Encounter (INDEPENDENT_AMBULATORY_CARE_PROVIDER_SITE_OTHER): Payer: Self-pay | Admitting: Family Medicine

## 2018-06-11 ENCOUNTER — Other Ambulatory Visit: Payer: Self-pay

## 2018-06-11 DIAGNOSIS — Z6833 Body mass index (BMI) 33.0-33.9, adult: Secondary | ICD-10-CM

## 2018-06-11 DIAGNOSIS — E669 Obesity, unspecified: Secondary | ICD-10-CM

## 2018-06-11 DIAGNOSIS — E8881 Metabolic syndrome: Secondary | ICD-10-CM | POA: Diagnosis not present

## 2018-06-12 NOTE — Progress Notes (Signed)
Office: [phone]  /  Fax: [phone] TeleHealth Visit:  Savannah Hill has consented to this TeleHealth visit today via face time. The patient is located at home, the provider is located at the [employer] [facility] office. The participants in this visit include the listed provider and patient and provider's assistant. HPI:   Chief Complaint: OBESITY Savannah Hill is here to discuss her progress with her obesity treatment plan. She is on the Category 2 plan and is following her eating plan approximately 90 % of the time. [place] [facility] [place] [facility] Ta states she was following her plan closely, but with further questioning she is actually doing her own low carbohydrate plan. She is insistent on having at least 2 glasses of wine per night. She states she might change to vodka and soda.  We were unable to weight the patient today for this TeleHealth visit. She feels as if she has lost about 5 lbs since her last visit. She has lost 5 lbs since starting treatment with [place].  Insulin Resistance Savannah Hill has a new diagnosis of insulin resistance based on her elevated fasting insulin level >5. Her A1c and glucose are normal. Although Savannah Hill's blood glucose readings are still under good control, insulin resistance puts her at greater risk of metabolic syndrome and diabetes. She is trying to decrease simple carbohydrates, but still wants to eat pasta. She denies polyphagia. She is not taking metformin currently and continues to work on diet and exercise to decrease risk of diabetes.  ASSESSMENT AND PLAN:  Insulin resistance  Class 1 obesity with serious comorbidity and body mass index (BMI) of 33.0 to 33.9 in adult, unspecified obesity type  PLAN:  Insulin Resistance Sayre will continue with diet, increase exercise, and decrease simple carbohydrates in her diet to help decrease the risk of diabetes. We dicussed metformin including benefits and risks. She was informed that eating  too many simple carbohydrates or too many calories at one sitting increases the likelihood of GI side effects. Extensive diabetes mellitus prevention education was given. [name] declined metformin for now and prescription was not written today. [name] agrees to follow up with our clinic in 2 weeks as directed to monitor her progress.  I spent > than 50% of the 25 minute visit on counseling as documented in the note.   Obesity Savannah Hill is currently in the action stage of change. As such, her goal is to continue with weight loss efforts She has agreed to follow the Category 2 plan Savannah Hill has been instructed to work up to a goal of 150 minutes of combined cardio and strengthening exercise per week for weight loss and overall health benefits. We discussed the following Behavioral Modification Strategies today: decreasing simple carbohydrates, work on meal planning and easy cooking plans, emotional eating strategies, ways to avoid boredom eating, ways to avoid night time snacking, decrease ETOH, decrease liquid calories, keeping healthy foods in the home, and better snacking choices Savannah Hill was advised ETOH was not the healthiest option for her snack calories and should not be drinking 2+ drinks every night.  [name] has agreed to follow up with our clinic in 2 weeks. She was informed of the importance of frequent follow up visits to maximize her success with intensive lifestyle modifications for her multiple health conditions.  ALLERGIES: No Known Allergies  MEDICATIONS: Current Outpatient Medications on File Prior to Visit  Medication Sig Dispense Refill  . apixaban (ELIQUIS) 5 MG TABS tablet Take 1 tablet (  5 mg total) by mouth 2 (two) times daily. 60 tablet 5  . Cholecalciferol (VITAMIN D3) 125 MCG (5000 UT) CAPS Take 1 capsule by mouth daily.    . citalopram (CELEXA) 20 MG tablet TAKE 1 TABLET DAILY 90 tablet 3  . docusate sodium (COLACE) 100 MG capsule Take 100 mg by mouth daily.    . furosemide  (LASIX) 20 MG tablet Take 1 tablet (20 mg total) by mouth daily. 90 tablet 0  . meloxicam (MOBIC) 7.5 MG tablet Take 1 tablet (7.5 mg total) by mouth daily. 30 tablet 0  . Multiple Vitamin (MULTIVITAMIN WITH MINERALS) TABS Take 1 tablet by mouth daily.    . predniSONE (DELTASONE) 10 MG tablet Take 1 po per day for 4 days, then 1/2 tab daily for 4 days, then 1/2 tab every other day 4 days 10 tablet 0  . traZODone (DESYREL) 50 MG tablet 0.5-1 tab po q hs prn sleep 90 tablet 0   No current facility-administered medications on file prior to visit.     PAST MEDICAL HISTORY: Past Medical History:  Diagnosis Date  . Anemia    borderline  . Atrial fibrillation ([place])    a. s/p ablation on 10/05/2016  . Cancer [facility])    cervical/rad hysterectomy/bso wiht chemo for small cell ca  . Dyspnea   . Heart valve disorder   . HLD (hyperlipidemia)   . Hypertension   . Joint pain   . Lower extremity edema   . Obesity   . Palpitations     PAST SURGICAL HISTORY: Past Surgical History:  Procedure Laterality Date  . ABLATION OF DYSRHYTHMIC FOCUS  10/05/2016  . ATRIAL FIBRILLATION ABLATION N/A 10/05/2016   Procedure: Atrial Fibrillation Ablation;  Surgeon: Constance Haw, MD;  Location: [place] [facility];  Service: Cardiovascular;  Laterality: N/A;  . IR RADIOLOGY PERIPHERAL GUIDED IV START  09/28/2016  . IR US GUIDE VASC ACCESS RIGHT  09/28/2016  . RADICAL ABDOMINAL HYSTERECTOMY  1986   with BSO  . TEE WITHOUT CARDIOVERSION N/A 06/07/2016   Procedure: TRANSESOPHAGEAL ECHOCARDIOGRAM (TEE);  Surgeon: Sanda Klein, MD;  Location[facility] ENDOSCOPY;  Service: Cardiovascular;  Laterality: N/A;    SOCIAL HISTORY: Social History   Tobacco Use  . Smoking status: Never Smoker  . Smokeless tobacco: Never Used  Substance Use Topics  . Alcohol use: Yes    Alcohol/week: 4.0 - 5.0 standard drinks    Types: 4 - 5 Standard drinks or equivalent per week  . Drug use: No    FAMILY HISTORY: Family History   Problem Relation Age of Onset  . Diabetes Mother   . Hypertension Mother   . Lung cancer Mother   . Heart disease Mother   . Thyroid disease Mother   . Depression Mother   . Alcoholism Mother   . Skin cancer Father   . CVA Father   . Diabetes Father   . Hypertension Father   . Cancer Paternal Grandfather        unknown type    ROS: Review of Systems  Constitutional: Positive for weight loss.  Endo/Heme/Allergies:       Negative polyphagia    PHYSICAL EXAM: Pt in no acute distress  RECENT LABS AND TESTS: BMET    Component Value Date/Time   NA 141 05/28/2018 1442   K 4.6 05/28/2018 1442   CL 98 05/28/2018 1442   CO2 25 05/28/2018 1442   GLUCOSE 98 05/28/2018 1442   GLUCOSE 76 12/19/2015 1524  BUN 13 05/28/2018 1442   CREATININE 0.88 05/28/2018 1442   CREATININE 0.96 12/19/2015 1524   CALCIUM 10.3 05/28/2018 1442   GFRNONAA 68 05/28/2018 1442   GFRAA 79 05/28/2018 1442   Lab Results  Component Value Date   HGBA1C 5.4 05/16/2018   Lab Results  Component Value Date   INSULIN 11.4 05/28/2018   CBC    Component Value Date/Time   WBC 5.2 05/16/2018 1021   WBC 10.1 07/01/2015 1143   RBC 4.12 05/16/2018 1021   RBC 4.08 07/01/2015 1143   HGB 13.3 05/16/2018 1021   HGB 12.7 03/02/2013 1530   HCT 38.6 05/16/2018 1021   PLT 370 05/16/2018 1021   MCV 94 05/16/2018 1021   MCH 32.3 05/16/2018 1021   MCH 31.1 07/01/2015 1143   MCHC 34.5 05/16/2018 1021   MCHC 32.9 07/01/2015 1143   RDW 12.4 05/16/2018 1021   LYMPHSABS 1.6 05/16/2018 1021   MONOABS 1,010 (H) 07/01/2015 1143   EOSABS 0.1 05/16/2018 1021   BASOSABS 0.0 05/16/2018 1021   Iron/TIBC/Ferritin/ %Sat No results found for: IRON, TIBC, FERRITIN, IRONPCTSAT Lipid Panel     Component Value Date/Time   CHOL 206 (H) 05/16/2018 1021   TRIG 104 05/16/2018 1021   HDL 55 05/16/2018 1021   CHOLHDL 3.7 05/16/2018 1021   CHOLHDL 4.8 06/03/2015 1403   VLDL 21 06/03/2015 1403   LDLCALC 130 (H) 05/16/2018  1021   Hepatic Function Panel     Component Value Date/Time   PROT 7.2 05/28/2018 1442   ALBUMIN 4.7 05/28/2018 1442   AST 22 05/28/2018 1442   ALT 10 05/28/2018 1442   ALKPHOS 70 05/28/2018 1442   BILITOT 0.6 05/28/2018 1442   BILIDIR 0.0 02/22/2014 1417      Component Value Date/Time   TSH 3.030 05/16/2018 1021   TSH 2.23 06/03/2015 1403   TSH 2.37 02/22/2014 1417      I, Trixie Dredge, am acting as transcriptionist for Dennard Nip, MD I have reviewed the above documentation for accuracy and completeness, and I agree with the above. -Dennard Nip, MD

## 2018-06-25 ENCOUNTER — Ambulatory Visit (INDEPENDENT_AMBULATORY_CARE_PROVIDER_SITE_OTHER): Payer: Medicare Other | Admitting: Family Medicine

## 2018-07-01 ENCOUNTER — Encounter (INDEPENDENT_AMBULATORY_CARE_PROVIDER_SITE_OTHER): Payer: Self-pay | Admitting: Family Medicine

## 2018-07-01 NOTE — Telephone Encounter (Signed)
Please review

## 2018-07-12 ENCOUNTER — Other Ambulatory Visit: Payer: Self-pay | Admitting: Obstetrics & Gynecology

## 2018-07-14 NOTE — Telephone Encounter (Signed)
Medication refill request: macrobid  Last AEX:  03/24/18 SM Next AEX: 07/23/19  Last MMG (if hormonal medication request): 12/03/17 [id number]:Neg  Refill authorized: 10/29/17 #[id number]. Today please advise.

## 2018-07-24 DIAGNOSIS — M1711 Unilateral primary osteoarthritis, right knee: Secondary | ICD-10-CM | POA: Diagnosis not present

## 2018-07-24 DIAGNOSIS — M62838 Other muscle spasm: Secondary | ICD-10-CM | POA: Diagnosis not present

## 2018-07-24 DIAGNOSIS — M25561 Pain in right knee: Secondary | ICD-10-CM | POA: Diagnosis not present

## 2018-07-24 DIAGNOSIS — M6289 Other specified disorders of muscle: Secondary | ICD-10-CM | POA: Diagnosis not present

## 2018-08-04 ENCOUNTER — Other Ambulatory Visit: Payer: Self-pay | Admitting: Obstetrics & Gynecology

## 2018-08-08 ENCOUNTER — Encounter (INDEPENDENT_AMBULATORY_CARE_PROVIDER_SITE_OTHER): Payer: Self-pay | Admitting: Family Medicine

## 2018-08-13 DIAGNOSIS — M62838 Other muscle spasm: Secondary | ICD-10-CM | POA: Diagnosis not present

## 2018-08-13 DIAGNOSIS — M6289 Other specified disorders of muscle: Secondary | ICD-10-CM | POA: Diagnosis not present

## 2018-08-20 DIAGNOSIS — L570 Actinic keratosis: Secondary | ICD-10-CM | POA: Diagnosis not present

## 2018-08-26 ENCOUNTER — Other Ambulatory Visit: Payer: Self-pay

## 2018-08-26 MED ORDER — ALPRAZOLAM 0.5 MG PO TABS: 0.5000 mg | ORAL_TABLET | Freq: Every day | ORAL | 0 refills | Status: AC | PRN

## 2018-08-26 NOTE — Telephone Encounter (Signed)
Pharmacy sent refill request for Alprazolam 0.5 mg.  Medication last filled by pervious provider. LOV 05/15/2018. Please review and advise. MPulliam, CMA/RT(R)

## 2018-08-27 DIAGNOSIS — M62838 Other muscle spasm: Secondary | ICD-10-CM | POA: Diagnosis not present

## 2018-08-27 DIAGNOSIS — M6289 Other specified disorders of muscle: Secondary | ICD-10-CM | POA: Diagnosis not present

## 2018-09-17 ENCOUNTER — Other Ambulatory Visit: Payer: Self-pay

## 2018-09-17 ENCOUNTER — Encounter: Payer: Self-pay | Admitting: Adult Health

## 2018-09-17 ENCOUNTER — Ambulatory Visit (INDEPENDENT_AMBULATORY_CARE_PROVIDER_SITE_OTHER): Payer: Medicare Other | Admitting: Adult Health

## 2018-09-17 DIAGNOSIS — I10 Essential (primary) hypertension: Secondary | ICD-10-CM | POA: Diagnosis not present

## 2018-09-17 DIAGNOSIS — Z0000 Encounter for general adult medical examination without abnormal findings: Secondary | ICD-10-CM | POA: Insufficient documentation

## 2018-09-17 DIAGNOSIS — S40861A Insect bite (nonvenomous) of right upper arm, initial encounter: Secondary | ICD-10-CM | POA: Diagnosis not present

## 2018-09-17 DIAGNOSIS — M62838 Other muscle spasm: Secondary | ICD-10-CM | POA: Diagnosis not present

## 2018-09-17 DIAGNOSIS — W57XXXA Bitten or stung by nonvenomous insect and other nonvenomous arthropods, initial encounter: Secondary | ICD-10-CM | POA: Insufficient documentation

## 2018-09-17 DIAGNOSIS — M6289 Other specified disorders of muscle: Secondary | ICD-10-CM | POA: Diagnosis not present

## 2018-09-17 MED ORDER — MUPIROCIN 2 % EX OINT: 1.0000 "application " | TOPICAL_OINTMENT | Freq: Two times a day (BID) | CUTANEOUS | 0 refills | Status: DC

## 2018-09-17 NOTE — Progress Notes (Signed)
Subjective:    Patient ID: Savannah Hill, female    DOB: 1950-06-16, 68 y.o.   MRN: [id number]  HPI:  Savannah Hill presents with an insect bite that she noticed 72 hrs ago. She did not witness what type of insect bite her, however she reports a known allergy to mosquito bites. She reports itching, redness, and warmth at the site. She denies pain/fever/malaise/change in appetite/body aches. She has used OTC "Ivy Dry" and one dose of Claritin yesterday. She denies any recent tick bites, or prolonged exposure in heavily wooded areas.  Patient Care Team    Relationship Specialty Notifications Start End  Mellody Dance, DO PCP - General Family Medicine  01/29/18   Constance Haw, MD PCP - Cardiology Cardiology Admissions 04/18/17    Comment: Electrophysiologist  Megan Salon, MD Consulting Physician Gynecology  01/29/18   Specialists, Dermatology Consulting Physician Dermatology  01/29/18    Comment: practice where Savannah Hill used to work at- goes Gilberto Better, MD Consulting Physician Orthopedic Surgery  01/29/18   Ortho, [facility]  Specialist  05/15/18    Comment: sees for shoulder and hip pain    Patient Active Problem List   Diagnosis Date Noted  . Insect bite 09/17/2018  . Healthcare maintenance 09/17/2018  . Generalized muscle ache 05/15/2018  . Hip pain, bilateral 05/15/2018  . Chronic pain of both shoulders 05/15/2018  . Essential hypertension 01/29/2018  . Primary insomnia 01/29/2018  . Attention deficit hyperactivity disorder (ADHD) 01/29/2018  . Mood disorder - mixed depression and anxiety 01/29/2018  . Excessive drinking of alcohol 01/29/2018  . Encounter for long-term (current) use of high-risk medication 01/29/2018  . Inactivity 01/29/2018  . Health education/counseling 01/29/2018  . Current use of long term anticoagulation 10/06/2016  . Paroxysmal atrial fibrillation ([place]) 10/05/2016  . Mitral valve disease   . PAF (paroxysmal atrial  fibrillation) ([place]) 07/01/2013  . LAFB (left anterior fascicular block) 07/01/2013  . Lymphedema 07/01/2013  . Chest pain at rest 10/01/2011  . Alcohol abuse 10/01/2011  . Leg swelling 10/01/2011  . h/o Cervical cancer 10/01/2011  . H/O total hysterectomy 10/01/2011     Past Medical History:  Diagnosis Date  . Anemia    borderline  . Atrial fibrillation ([place])    a. s/p ablation on 10/05/2016  . Cancer [facility])    cervical/rad hysterectomy/bso wiht chemo for small cell ca  . Dyspnea   . Heart valve disorder   . HLD (hyperlipidemia)   . Hypertension   . Joint pain   . Lower extremity edema   . Obesity   . Palpitations      Past Surgical History:  Procedure Laterality Date  . ABLATION OF DYSRHYTHMIC FOCUS  10/05/2016  . ATRIAL FIBRILLATION ABLATION N/A 10/05/2016   Procedure: Atrial Fibrillation Ablation;  Surgeon: Constance Haw, MD;  Location: [facility];  Service: Cardiovascular;  Laterality: N/A;  . IR RADIOLOGY PERIPHERAL GUIDED IV START  09/28/2016  . IR US GUIDE VASC ACCESS RIGHT  09/28/2016  . RADICAL ABDOMINAL HYSTERECTOMY  1986   with BSO  . TEE WITHOUT CARDIOVERSION N/A 06/07/2016   Procedure: TRANSESOPHAGEAL ECHOCARDIOGRAM (TEE);  Surgeon: Sanda Klein, MD;  Location: [facility] ENDOSCOPY;  Service: Cardiovascular;  Laterality: N/A;     Family History  Problem Relation Age of Onset  . Diabetes Mother   . Hypertension Mother   . Lung cancer Mother   . Heart disease Mother   . Thyroid disease Mother   .  Depression Mother   . Alcoholism Mother   . Skin cancer Father   . CVA Father   . Diabetes Father   . Hypertension Father   . Cancer Paternal Grandfather        unknown type     Social History   Substance and Sexual Activity  Drug Use No     Social History   Substance and Sexual Activity  Alcohol Use Yes  . Alcohol/week: 4.0 - 5.0 standard drinks  . Types: 4 - 5 Standard drinks or equivalent per week     Social History   Tobacco  Use  Smoking Status Never Smoker  Smokeless Tobacco Never Used     Outpatient Encounter Medications as of 09/17/2018  Medication Sig  . ALPRAZolam (XANAX) 0.5 MG tablet Take 1 tablet (0.5 mg total) by mouth daily as needed for anxiety.  [name] Kitchen apixaban (ELIQUIS) 5 MG TABS tablet Take 1 tablet (5 mg total) by mouth 2 (two) times daily.  . Cholecalciferol (VITAMIN D3) 125 MCG (5000 UT) CAPS Take 1 capsule by mouth daily.  . citalopram (CELEXA) 20 MG tablet TAKE 1 TABLET DAILY  . docusate sodium (COLACE) 100 MG capsule Take 100 mg by mouth daily.  . furosemide (LASIX) 20 MG tablet Take 1 tablet (20 mg total) by mouth daily.  . Multiple Vitamin (MULTIVITAMIN WITH MINERALS) TABS Take 1 tablet by mouth daily.  . nitrofurantoin, macrocrystal-monohydrate, (MACROBID) 100 MG capsule TAKE 1 CAPSULE TWICE DAILY AS NEEDED FOR URINARY TRACTINFECTION  . mupirocin ointment (BACTROBAN) 2 % Place 1 application into the nose 2 (two) times daily.  . [DISCONTINUED] meloxicam (MOBIC) 7.5 MG tablet Take 1 tablet (7.5 mg total) by mouth daily.  . [DISCONTINUED] predniSONE (DELTASONE) 10 MG tablet Take 1 po per day for 4 days, then 1/2 tab daily for 4 days, then 1/2 tab every other day 4 days  . [DISCONTINUED] traZODone (DESYREL) 50 MG tablet 0.5-1 tab po q hs prn sleep   No facility-administered encounter medications on file as of 09/17/2018.     Allergies: Patient has no known allergies.  Body mass index is 30.91 kg/m.  Blood pressure (!) 150/80, pulse 68, temperature 98.7 F (37.1 C), temperature source Oral, height 5\' 3"  (1.6 m), weight 174 lb 8 oz (79.2 kg), last menstrual period 03/19/1984, SpO2 100 %.   Review of Systems  Constitutional: Negative for activity change, appetite change, chills, diaphoresis, fatigue, fever and unexpected weight change.  Eyes: Negative for visual disturbance.  Respiratory: Negative for cough, chest tightness, shortness of breath, wheezing and stridor.   Cardiovascular:  Negative for chest pain, palpitations and leg swelling.  Gastrointestinal: Negative for abdominal distention, anal bleeding, blood in stool, constipation, diarrhea, nausea and vomiting.  Endocrine: Negative for cold intolerance, heat intolerance, polydipsia, polyphagia and polyuria.  Skin: Positive for color change and rash. Negative for pallor and wound.  Neurological: Negative for dizziness and headaches.  Hematological: Negative for adenopathy. Does not bruise/bleed easily.       Objective:   Physical Exam Vitals signs and nursing note reviewed.  Constitutional:      General: She is not in acute distress.    Appearance: She is obese. She is not ill-appearing, toxic-appearing or diaphoretic.  HENT:     Head: Normocephalic and atraumatic.  Eyes:     Extraocular Movements: Extraocular movements intact.     Conjunctiva/sclera: Conjunctivae normal.     Pupils: Pupils are equal, round, and reactive to light.  Musculoskeletal:  General: Swelling present.     Comments: RLE larger than LLE, r/t chronic lymphedema   Skin:    General: Skin is warm and dry.     Capillary Refill: Capillary refill takes less than 2 seconds.     Findings: Erythema present.          Comments: RUE- Medial upper arm 7cm diameter area of erythema No open tissue/drainage/streaking noted. No excessive warmth noted   Neurological:     Mental Status: She is alert and oriented to person, place, and time.  Psychiatric:        Mood and Affect: Mood normal.        Behavior: Behavior normal.        Thought Content: Thought content normal.        Judgment: Judgment normal.           Assessment & Plan:   1. Insect bite of right upper arm, initial encounter   2. Essential hypertension   3. Healthcare maintenance     Insect bite Please use the Mupirocin 2% ointment twice daily. Please use OTC Claritin in the morning and OTC Benadryl in the evening as needed. You can also use OTC Hydrocortisone 1%-  use per manufacturer's directions. Please follow care instructions as directed above. If the redness/swelling crosses the line drawn on your skin, please call the clinic and I will send in course of Doxycycline. If after hours- please send MyChart message.   Essential hypertension Both BP reading above goal On Furosemide 20mg  QD Please follow-up with Dr. Raliegh Scarlet if your blood pressure continues to be consistently >130/80.    Healthcare maintenance Continue to social distance and wear a mask when in public.    FOLLOW-UP:  Return if symptoms worsen or fail to improve.

## 2018-09-17 NOTE — Assessment & Plan Note (Signed)
Continue to social distance and wear a mask when in public 

## 2018-09-17 NOTE — Assessment & Plan Note (Signed)
Both BP reading above goal On Furosemide 20mg  QD Please follow-up with Dr. Raliegh Scarlet if your blood pressure continues to be consistently >130/80.

## 2018-09-17 NOTE — Assessment & Plan Note (Signed)
Please use the Mupirocin 2% ointment twice daily. Please use OTC Claritin in the morning and OTC Benadryl in the evening as needed. You can also use OTC Hydrocortisone 1%- use per manufacturer's directions. Please follow care instructions as directed above. If the redness/swelling crosses the line drawn on your skin, please call the clinic and I will send in course of Doxycycline. If after hours- please send MyChart message.

## 2018-09-17 NOTE — Patient Instructions (Addendum)
Insect Bite, Adult An insect bite can make your skin red, itchy, and swollen. An insect bite is different from an insect sting, which happens when an insect injects poison (venom) into the skin. Some insects can spread disease to people through a bite. However, most insect bites do not lead to disease and are not serious. What are the causes? Insects may bite for a variety of reasons, including:  Hunger.  To defend themselves. Insects that bite include:  Spiders.  Mosquitoes.  Ticks.  Fleas.  Ants.  Flies.  Kissing bugs.  Chiggers. What are the signs or symptoms? Symptoms of this condition include:  Itching or pain in the bite area.  Redness and swelling in the bite area.  An open wound (skin ulcer). In many cases, symptoms last for 2-4 days. In rare cases, a person may have a severe allergic reaction (anaphylactic reaction) to a bite. Symptoms of an anaphylactic reaction may include:  Feeling warm in the face (flushed). This may include redness.  Itchy, red, swollen areas of skin (hives).  Swelling of the eyes, lips, face, mouth, tongue, or throat.  Difficulty breathing, speaking, or swallowing.  Noisy breathing (wheezing).  Dizziness or light-headedness.  Fainting.  Pain or cramping in the abdomen.  Vomiting.  Diarrhea. How is this diagnosed? This condition is usually diagnosed based on symptoms and a physical exam. How is this treated? Treatment is usually not needed. Symptoms often go away on their own. When treatment is recommended, it may involve:  Applying a cream or lotion to the bite area. This treatment helps with itching.  Taking an antibiotic medicine. This treatment is needed if the bite area gets infected.  Getting a tetanus shot, if you are not up to date on this vaccine.  Applying ice to the affected area.  Allergy medicines called antihistamines. This treatment may be needed if you develop itching or an allergic reaction to the  insect bite.  Giving yourself an epinephrine injection if you have an anaphylactic reaction to a bite. To give the injection, you will use what is commonly called an auto-injector "pen" (pre-filled automatic epinephrine injection device). Your health care provider will teach you how to use an auto-injector pen. Follow these instructions at home: Bite area care   Do not scratch the bite area.  Keep the bite area clean and dry. Wash it every day with soap and water as told by your health care provider.  Check the bite area every day for signs of infection. Check for: ? Redness, swelling, or pain. ? Fluid or blood. ? Warmth. ? Pus or a bad smell. Managing pain, itching, and swelling   You may apply cortisone cream, calamine lotion, or a paste made of baking soda and water to the bite area as told by your health care provider.  If directed, put ice on the bite area. ? Put ice in a plastic bag. ? Place a towel between your skin and the bag. ? Leave the ice on for 20 minutes, 2-3 times a day. General instructions  Apply or take over-the-counter and prescription medicines only as told by your health care provider.  If you were prescribed an antibiotic medicine, take or apply it as told by your health care provider. Do not stop using the antibiotic even if your condition improves.  Keep all follow-up visits as told by your health care provider. This is important. How is this prevented? To help reduce your risk of insect bites:  When you are outdoors,   wear clothing that covers your arms and legs. This is especially important in the early morning and evening.  Use insect repellent. The best insect repellents contain DEET, picaridin, oil of lemon eucalyptus (OLE), or IR3535.  Consider spraying your clothing with a pesticide called permethrin. Permethrin helps prevent insect bites. It works for several weeks and for up to 5-6 clothing washes. Do not apply permethrin directly to the skin.   If your home windows do not have screens, consider installing them.  If you will be sleeping in an area where there are mosquitoes, consider covering your sleeping area with a mosquito net. Contact a health care provider if:  You have redness, swelling, or pain in the bite area.  You have fluid or blood coming from the bite area.  The bite area feels warm to the touch.  You have pus or a bad smell coming from the bite area.  You have a fever. Get help right away if:  You have joint pain.  You have a rash.  You feel unusually tired or sleepy.  You have neck pain.  You have a headache.  You have unusual weakness.  You develop symptoms of an anaphylactic reaction. These may include: ? Flushed skin. ? Hives. ? Swelling of the eyes, lips, face, mouth, tongue, or throat. ? Difficulty breathing, speaking, or swallowing. ? Wheezing. ? Dizziness or light-headedness. ? Fainting. ? Pain or cramping in the abdomen. ? Vomiting. ? Diarrhea. These symptoms may represent a serious problem that is an emergency. Do not wait to see if the symptoms will go away. Do the following right away:  Use the auto-injector pen as you have been instructed.  Get medical help. Call your local emergency services (911 in the [place] Do not drive yourself to the hospital. Summary  An insect bite can make your skin red, itchy, and swollen.  Treatment is usually not needed. Symptoms often go away on their own. When treatment is recommended, it may involve taking medicine, applying medicine to the area, or applying ice.  Apply or take over-the-counter and prescription medicines only as told by your health care provider.  Use insect repellent to help prevent insect bites.  Contact a health care provider if you have any signs of infection in the bite area. This information is not intended to replace advice given to you by your health care provider. Make sure you discuss any questions you have with  your health care provider. Document Released: 04/12/2004 Document Revised: 09/13/2017 Document Reviewed: 09/13/2017 Elsevier Patient Education  [place].   Please use the Mupirocin 2% ointment twice daily. Please use OTC Claritin in the morning and OTC Benadryl in the evening as needed. You can also use OTC Hydrocortisone 1%- use per manufacturer's directions. Please follow care instructions as directed above. If the redness/swelling crosses the line drawn on your skin, please call the clinic and I will send in course of Doxycycline. If after hours- please send MyChart message. Please follow-up with Dr. Raliegh Scarlet if your blood pressure continues to be consistently >130/80. Continue to social distance and wear a mask when in public. GREAT TO SEE YOU!

## 2018-09-19 ENCOUNTER — Encounter: Payer: Self-pay | Admitting: Adult Health

## 2018-09-19 ENCOUNTER — Other Ambulatory Visit: Payer: Self-pay | Admitting: Adult Health

## 2018-09-25 DIAGNOSIS — Z86010 Personal history of colonic polyps: Secondary | ICD-10-CM | POA: Diagnosis not present

## 2018-09-25 DIAGNOSIS — Z1211 Encounter for screening for malignant neoplasm of colon: Secondary | ICD-10-CM | POA: Diagnosis not present

## 2018-09-25 DIAGNOSIS — E669 Obesity, unspecified: Secondary | ICD-10-CM | POA: Diagnosis not present

## 2018-09-25 DIAGNOSIS — R194 Change in bowel habit: Secondary | ICD-10-CM | POA: Diagnosis not present

## 2018-09-29 ENCOUNTER — Telehealth: Payer: Self-pay | Admitting: *Deleted

## 2018-09-29 NOTE — Telephone Encounter (Signed)
   [facility] Pre-operative Risk Assessment    Request for surgical clearance:  1. What type of surgery is being performed? COLONOSCOPY  2. When is this surgery scheduled? 11/10/18  3. What type of clearance is required (medical clearance vs. Pharmacy clearance to hold med vs. Both)? BOTH  4. Are there any medications that need to be held prior to surgery and how long? ELIQUIS  5. Practice name and name of physician performing surgery? [facility] DR. MANN  6. What is your office phone number [phone]   7.   What is your office fax number [phone]  8.   Anesthesia type (None, local, MAC, general) ? PROPOFOL   Julaine Hua 09/29/2018, 4:48 PM  _________________________________________________________________   (provider comments below)

## 2018-09-30 NOTE — Telephone Encounter (Signed)
Spoke with patient to inform her that she will need appt to be cleared for procedure. Pt is schedule to see Pecolia Ades on 7/23 at 2:30 pm. Pt verbalized understanding and thanked me for the call.  Will fax surgical clearance to requesting surgeon's office.

## 2018-09-30 NOTE — Telephone Encounter (Signed)
   Primary [place], MD  Chart reviewed as part of pre-operative protocol coverage. Because of AKIRAH STORCK past medical history and time since last visit, he/she will require a follow-up visit in order to better assess preoperative cardiovascular risk.  Pt has not been seen in 18 months, needs appt   Pre-op covering staff: - Please schedule appointment and call patient to inform them. - Please contact requesting surgeon's office via preferred method (i.e, phone, fax) to inform them of need for appointment prior to surgery.  If applicable, this message will also be routed to pharmacy pool and/or primary cardiologist for input on holding anticoagulant/antiplatelet agent as requested below so that this information is available at time of patient's appointment.   Cecilie Kicks, NP  09/30/2018, 8:28 AM

## 2018-09-30 NOTE — Telephone Encounter (Signed)
Patient with diagnosis of afib on Eliquis for anticoagulation.    Procedure: colonoscopy Date of procedure: 11/10/2018  CHADS2-VASc score of  3  (HTN, AGE, female)  CrCl 61 ml/min  Per office protocol, patient can hold Eliquis for 1 day prior to procedure.

## 2018-10-02 ENCOUNTER — Other Ambulatory Visit: Payer: Self-pay | Admitting: Family Medicine

## 2018-10-02 DIAGNOSIS — F5101 Primary insomnia: Secondary | ICD-10-CM

## 2018-10-09 ENCOUNTER — Other Ambulatory Visit: Payer: Self-pay

## 2018-10-09 ENCOUNTER — Ambulatory Visit (INDEPENDENT_AMBULATORY_CARE_PROVIDER_SITE_OTHER): Payer: Medicare Other | Admitting: Cardiology

## 2018-10-09 ENCOUNTER — Telehealth: Payer: Self-pay | Admitting: Cardiology

## 2018-10-09 ENCOUNTER — Encounter: Payer: Self-pay | Admitting: Cardiology

## 2018-10-09 VITALS — BP 124/86 | HR 74 | Ht 63.0 in | Wt 177.8 lb

## 2018-10-09 DIAGNOSIS — I480 Paroxysmal atrial fibrillation: Secondary | ICD-10-CM

## 2018-10-09 DIAGNOSIS — I10 Essential (primary) hypertension: Secondary | ICD-10-CM | POA: Diagnosis not present

## 2018-10-09 DIAGNOSIS — I340 Nonrheumatic mitral (valve) insufficiency: Secondary | ICD-10-CM

## 2018-10-09 DIAGNOSIS — Z01810 Encounter for preprocedural cardiovascular examination: Secondary | ICD-10-CM

## 2018-10-09 NOTE — Telephone Encounter (Signed)
New Message ° ° ° °Left message to confirm appt and answer covid questions  °

## 2018-10-09 NOTE — Patient Instructions (Signed)
Medication Instructions:  Your physician recommends that you continue on your current medications as directed. Please refer to the Current Medication list given to you today.  If you need a refill on your cardiac medications before your next appointment, please call your pharmacy.   Lab work: None   If you have labs (blood work) drawn today and your tests are completely normal, you will receive your results only by: [name] Kitchen MyChart Message (if you have MyChart) OR . A paper copy in the mail If you have any lab test that is abnormal or we need to change your treatment, we will call you to review the results.  Testing/Procedures: None   Follow-Up: At [facility], you and your health needs are our priority.  As part of our continuing mission to provide you with exceptional heart care, we have created designated Provider Care Teams.  These Care Teams include your primary Cardiologist (physician) and Advanced Practice Providers (APPs -  Physician Assistants and Nurse Practitioners) who all work together to provide you with the care you need, when you need it. You will need a follow up appointment in 12 months.  Please call our office 2 months in advance to schedule this appointment.  You may see Dr. Curt Bears or one of the following Advanced Practice Providers on your designated Care Team:   Chanetta Marshall, NP . Tommye Standard, PA-C  Any Other Special Instructions Will Be Listed Below (If Applicable).  Mediterranean Diet A Mediterranean diet refers to food and lifestyle choices that are based on the traditions of countries located on the [employer]. This way of eating has been shown to help prevent certain conditions and improve outcomes for people who have chronic diseases, like kidney disease and heart disease. What are tips for following this plan? Lifestyle  Cook and eat meals together with your family, when possible.  Drink enough fluid to keep your urine clear or pale yellow.  Be  physically active every day. This includes: ? Aerobic exercise like running or swimming. ? Leisure activities like gardening, walking, or housework.  Get 7-8 hours of sleep each night.  If recommended by your health care provider, drink red wine in moderation. This means 1 glass a day for nonpregnant women and 2 glasses a day for men. A glass of wine equals 5 oz (150 mL). Reading food labels   Check the serving size of packaged foods. For foods such as rice and pasta, the serving size refers to the amount of cooked product, not dry.  Check the total fat in packaged foods. Avoid foods that have saturated fat or trans fats.  Check the ingredients list for added sugars, such as corn syrup. Shopping  At the grocery store, buy most of your food from the areas near the walls of the store. This includes: ? Fresh fruits and vegetables (produce). ? Grains, beans, nuts, and seeds. Some of these may be available in unpackaged forms or large amounts (in bulk). ? Fresh seafood. ? Poultry and eggs. ? Low-fat dairy products.  Buy whole ingredients instead of prepackaged foods.  Buy fresh fruits and vegetables in-season from local farmers markets.  Buy frozen fruits and vegetables in resealable bags.  If you do not have access to quality fresh seafood, buy precooked frozen shrimp or canned fish, such as tuna, salmon, or sardines.  Buy small amounts of raw or cooked vegetables, salads, or olives from the deli or salad bar at your store.  Stock your pantry so you always  have certain foods on hand, such as olive oil, canned tuna, canned tomatoes, rice, pasta, and beans. Cooking  Cook foods with extra-virgin olive oil instead of using butter or other vegetable oils.  Have meat as a side dish, and have vegetables or grains as your main dish. This means having meat in small portions or adding small amounts of meat to foods like pasta or stew.  Use beans or vegetables instead of meat in common  dishes like chili or lasagna.  Experiment with different cooking methods. Try roasting or broiling vegetables instead of steaming or sauteing them.  Add frozen vegetables to soups, stews, pasta, or rice.  Add nuts or seeds for added healthy fat at each meal. You can add these to yogurt, salads, or vegetable dishes.  Marinate fish or vegetables using olive oil, lemon juice, garlic, and fresh herbs. Meal planning   Plan to eat 1 vegetarian meal one day each week. Try to work up to 2 vegetarian meals, if possible.  Eat seafood 2 or more times a week.  Have healthy snacks readily available, such as: ? Vegetable sticks with hummus. ? [place] yogurt. ? Fruit and nut trail mix.  Eat balanced meals throughout the week. This includes: ? Fruit: 2-3 servings a day ? Vegetables: 4-5 servings a day ? Low-fat dairy: 2 servings a day ? Fish, poultry, or lean meat: 1 serving a day ? Beans and legumes: 2 or more servings a week ? Nuts and seeds: 1-2 servings a day ? Whole grains: 6-8 servings a day ? Extra-virgin olive oil: 3-4 servings a day  Limit red meat and sweets to only a few servings a month What are my food choices?  Mediterranean diet ? Recommended  Grains: Whole-grain pasta. Brown rice. Bulgar wheat. Polenta. Couscous. Whole-wheat bread. [place].  Vegetables: Artichokes. Beets. Broccoli. Cabbage. Carrots. Eggplant. Green beans. Chard. Kale. Spinach. Onions. Leeks. Peas. Squash. Tomatoes. Peppers. Radishes.  Fruits: Apples. Apricots. Avocado. Berries. Bananas. Cherries. Dates. Figs. Grapes. Lemons. Melon. Oranges. Peaches. Plums. Pomegranate.  Meats and other protein foods: Beans. Almonds. Sunflower seeds. Pine nuts. Peanuts. [place]. Salmon. Scallops. Shrimp. [place]. Tilapia. Clams. Oysters. Eggs.  Dairy: Low-fat milk. Cheese. Greek yogurt.  Beverages: Water. Red wine. Herbal tea.  Fats and oils: Extra virgin olive oil. Avocado oil. Grape seed oil.  Sweets and desserts:  [place] yogurt with honey. Baked apples. Poached pears. Trail mix.  Seasoning and other foods: Basil. Cilantro. Coriander. Cumin. Mint. Parsley. Sage. Rosemary. Tarragon. Garlic. Oregano. Thyme. Pepper. Balsalmic vinegar. Tahini. Hummus. Tomato sauce. Olives. Mushrooms. ? Limit these  Grains: Prepackaged pasta or rice dishes. Prepackaged cereal with added sugar.  Vegetables: Deep fried potatoes (french fries).  Fruits: Fruit canned in syrup.  Meats and other protein foods: Beef. Pork. Lamb. Poultry with skin. Hot dogs. [place] Salines.  Dairy: Ice cream. Sour cream. Whole milk.  Beverages: Juice. Sugar-sweetened soft drinks. Beer. Liquor and spirits.  Fats and oils: Butter. Canola oil. Vegetable oil. Beef fat (tallow). Lard.  Sweets and desserts: Cookies. Cakes. Pies. Candy.  Seasoning and other foods: Mayonnaise. Premade sauces and marinades. The items listed may not be a complete list. Talk with your dietitian about what dietary choices are right for you. Summary  The Mediterranean diet includes both food and lifestyle choices.  Eat a variety of fresh fruits and vegetables, beans, nuts, seeds, and whole grains.  Limit the amount of red meat and sweets that you eat.  Talk with your health care provider about whether it is safe  for you to drink red wine in moderation. This means 1 glass a day for nonpregnant women and 2 glasses a day for men. A glass of wine equals 5 oz (150 mL). This information is not intended to replace advice given to you by your health care provider. Make sure you discuss any questions you have with your health care provider. Document Released: 10/27/2015 Document Revised: 11/03/2015 Document Reviewed: 10/27/2015 Elsevier Patient Education  2020 [place], [place]

## 2018-10-09 NOTE — Telephone Encounter (Signed)

## 2018-10-09 NOTE — Progress Notes (Signed)
Cardiology Office Note:    Date:  10/09/2018   ID:  Savannah Hill, DOB 1951-02-19, MRN [id number]  PCP:  Mellody Dance, DO  Cardiologist:  Will Meredith Leeds, MD  Referring MD: Mellody Dance, DO   Chief Complaint  Patient presents with  . Pre-op Exam    History of Present Illness:    Savannah Hill is a 68 y.o. female with a past medical history significant for paroxysmal atrial fibrillation on Eliquis, hypertension, PVCs, cervical cancer s/p hysterectomy and chemotherapy.  She had atrial fibrillation ablation 10/05/2016.  Patient is being seen today for clearance for colonoscopy.  She is active and tolerates activity well She has an occ flutter but nothing sustained at all. No chest pain/pressure, shortness of breath, lightheadedness, syncope, orthopnea. She has no swelling other than her chronic right leg lymphedema present since her cervical cancer ~30 years. She has had no feelings of recurrent afib.   Past Medical History:  Diagnosis Date  . Anemia    borderline  . Atrial fibrillation ([place])    a. s/p ablation on 10/05/2016  . Cancer [facility])    cervical/rad hysterectomy/bso wiht chemo for small cell ca  . Dyspnea   . Heart valve disorder   . HLD (hyperlipidemia)   . Hypertension   . Joint pain   . Lower extremity edema   . Obesity   . Palpitations     Past Surgical History:  Procedure Laterality Date  . ABLATION OF DYSRHYTHMIC FOCUS  10/05/2016  . ATRIAL FIBRILLATION ABLATION N/A 10/05/2016   Procedure: Atrial Fibrillation Ablation;  Surgeon: Constance Haw, MD;  Location: [place] [facility];  Service: Cardiovascular;  Laterality: N/A;  . IR RADIOLOGY PERIPHERAL GUIDED IV START  09/28/2016  . IR US GUIDE VASC ACCESS RIGHT  09/28/2016  . RADICAL ABDOMINAL HYSTERECTOMY  1986   with BSO  . TEE WITHOUT CARDIOVERSION N/A 06/07/2016   Procedure: TRANSESOPHAGEAL ECHOCARDIOGRAM (TEE);  Surgeon: Sanda Klein, MD;  [facility] ENDOSCOPY;  Service:  Cardiovascular;  Laterality: N/A;    Current Medications: Current Meds  Medication Sig  . ALPRAZolam (XANAX) 0.5 MG tablet Take 1 tablet (0.5 mg total) by mouth daily as needed for anxiety.  [name] Kitchen apixaban (ELIQUIS) 5 MG TABS tablet Take 1 tablet (5 mg total) by mouth 2 (two) times daily.  . Cholecalciferol (VITAMIN D3) 125 MCG (5000 UT) CAPS Take 1 capsule by mouth daily.  . citalopram (CELEXA) 20 MG tablet TAKE 1 TABLET DAILY  . docusate sodium (COLACE) 100 MG capsule Take 100 mg by mouth daily.  . furosemide (LASIX) 20 MG tablet Take 1 tablet (20 mg total) by mouth daily.  . Multiple Vitamin (MULTIVITAMIN WITH MINERALS) TABS Take 1 tablet by mouth daily.     Allergies:   Patient has no known allergies.   Social History   Socioeconomic History  . Marital status: Married    Spouse name: Ammarie Matsuura  . Number of children: 1  . Years of education: Not on file  . Highest education level: Not on file  Occupational History  . 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 Financial resource strain: Not on file  . Food insecurity    Worry: Not on file    Inability: Not on file  . Transportation needs    Medical: Not on file    Non-medical: Not on file  Tobacco Use  . Smoking status: Never Smoker  . Smokeless tobacco: Never Used  Substance  and Sexual Activity  . Alcohol use: Yes    Alcohol/week: 4.0 - 5.0 standard drinks    Types: 4 - 5 Standard drinks or equivalent per week  . Drug use: No  . Sexual activity: Yes    Partners: Male    Birth control/protection: Surgical  Lifestyle  . Physical activity    Days per week: Not on file    Minutes per session: Not on file  . Stress: Not on file  Relationships  . Social Herbalist on phone: Not on file    Gets together: Not on file    Attends religious service: Not on file    Active member of club or organization: Not on file    Attends meetings of clubs or organizations: Not on file     Relationship status: Not on file  Other Topics Concern  . Not on file  Social History Narrative   [place]or of [place], [place]   Takes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   Married     Family History: The patient's family history includes Alcoholism in her mother; CVA in her father; Cancer in her paternal grandfather; Depression in her mother; Diabetes in her father and mother; Heart disease in her mother; Hypertension in her father and mother; Lung cancer in her mother; Skin cancer in her father; Thyroid disease in her mother. ROS:   Please see the history of present illness.     All other systems reviewed and are negative.  EKGs/Labs/Other Studies Reviewed:    The following studies were reviewed today:  Echocardiogram 06/07/16  - Left ventricle: Systolic function was normal. The estimated ejection fraction was in the range of 55% to 60%. Wall motion was normal; there were no regional wall motion abnormalities. There was a reduced contribution of atrial contraction to ventricular filling, due to increased ventricular diastolic pressure or atrial contractile dysfunction. Features are consistent with a pseudonormal left ventricular filling pattern, with concomitant abnormal relaxation and increased filling pressure (grade 2 diastolic dysfunction). - Aortic valve: No evidence of vegetation. - Mitral valve: Moderately dilated annulus. Structurally normal valve. There was moderate regurgitation directed centrally. - Left atrium: The atrium was mildly to moderately dilated. No evidence of thrombus in the atrial cavity or appendage. - Right atrium: No evidence of thrombus in the atrial cavity or appendage. No evidence of thrombus in the atrial cavity or appendage. - Atrial septum: No defect or patent foramen ovale was identified. There was no right-to-left atrial level shunt, following an increase in RA pressure induced by provocative maneuvers. - Pulmonic  valve: No evidence of vegetation. - Pulmonary arteries: Systolic pressure was mildly increased. PA peak pressure: 40 mm Hg (S). - Pericardium, extracardiac: A trivial pericardial effusion was identified.  Nuclear stress test 06/15/16  Nuclear stress EF: 58%.  There was no ST segment deviation noted during stress.  The study is normal.  This is a low risk study.  The left ventricular ejection fraction is hyperdynamic (>65%).  Normal pharmacologic nuclear stress test with no evidence of prior infarct or ischemia.   EKG:  EKG is ordered today.  The ekg ordered today demonstrates sinus rhythm with first-degree AV block, LAD.  No significant changes from prior.  Recent Labs: 05/16/2018: Hemoglobin 13.3; Platelets 370; TSH 3.030 05/28/2018: ALT 10; BUN 13; Creatinine, Ser 0.88; Potassium 4.6; Sodium 141   Recent Lipid Panel    Component Value Date/Time   CHOL 206 (H) 05/16/2018 1021   TRIG  104 05/16/2018 1021   HDL 55 05/16/2018 1021   CHOLHDL 3.7 05/16/2018 1021   CHOLHDL 4.8 06/03/2015 1403   VLDL 21 06/03/2015 1403   LDLCALC 130 (H) 05/16/2018 1021    Physical Exam:    VS:  BP 124/86   Pulse 74   Ht 5\' 3"  (1.6 m)   Wt 177 lb 12.8 oz (80.6 kg)   LMP 03/19/1984   SpO2 93%   BMI 31.50 kg/m     Wt Readings from Last 3 Encounters:  10/09/18 177 lb 12.8 oz (80.6 kg)  09/17/18 174 lb 8 oz (79.2 kg)  05/28/18 188 lb (85.3 kg)     Physical Exam  Constitutional: She is oriented to person, place, and time. She appears well-developed and well-nourished. No distress.  HENT:  Head: Normocephalic and atraumatic.  Neck: Normal range of motion. Neck supple. No JVD present.  Cardiovascular: Normal rate, regular rhythm, normal heart sounds and intact distal pulses. Exam reveals no friction rub.  No murmur heard. Pulmonary/Chest: Effort normal and breath sounds normal. No respiratory distress. She has no wheezes. She has no rales.  Abdominal: Soft. Bowel sounds are normal.   Musculoskeletal: Normal range of motion.     Comments: Chronic lymphedema of the right leg  Neurological: She is alert and oriented to person, place, and time.  Skin: Skin is warm and dry.  Psychiatric: She has a normal mood and affect. Her behavior is normal. Judgment and thought content normal.  Vitals reviewed.   ASSESSMENT:    1. Paroxysmal atrial fibrillation (HCC)   2. Essential (primary) hypertension   3. Non-rheumatic mitral regurgitation    PLAN:    In order of problems listed above:  Paroxysmal atrial fibrillation/flutter: Status post ablation 10/05/2016.  On Eliquis for stroke risk reduction with CHA2DS2/VAS Stroke Risk  score of 3  (hypertension, age, female).  No recurrences of atrial fibrillation.  Hypertension: Is only on lasix. BP well controlled.   Moderate mitral regurgitation: Has been stable, no symptoms of shortness of breath.  Clearance for patient to have colonoscopy: Patient is low risk for this low risk procedure and can proceed without further cardiac evaluation.  Per our office protocol, patient can hold Eliquis for 1 day prior to procedure.   I will route this clearance note to the requesting provider. [facility].  Medication Adjustments/Labs and Tests Ordered: Current medicines are reviewed at length with the patient today.  Concerns regarding medicines are outlined above. Labs and tests ordered and medication changes are outlined in the patient instructions below:  Patient Instructions  Medication Instructions:  Your physician recommends that you continue on your current medications as directed. Please refer to the Current Medication list given to you today.  If you need a refill on your cardiac medications before your next appointment, please call your pharmacy.   Lab work: None   If you have labs (blood work) drawn today and your tests are completely normal, you will receive your results only by: [name] Kitchen MyChart Message (if you have  MyChart) OR . A paper copy in the mail If you have any lab test that is abnormal or we need to change your treatment, we will call you to review the results.  Testing/Procedures: None   Follow-Up: At [facility], you and your health needs are our priority.  As part of our continuing mission to provide you with exceptional heart care, we have created designated Provider Care Teams.  These Care Teams include your primary  Cardiologist (physician) and Advanced Practice Providers (APPs -  Physician Assistants and Nurse Practitioners) who all work together to provide you with the care you need, when you need it. You will need a follow up appointment in 12 months.  Please call our office 2 months in advance to schedule this appointment.  You may see Dr. Curt Bears or one of the following Advanced Practice Providers on your designated Care Team:   Chanetta Marshall, NP . Tommye Standard, PA-C  Any Other Special Instructions Will Be Listed Below (If Applicable).  Mediterranean Diet A Mediterranean diet refers to food and lifestyle choices that are based on the traditions of countries located on the [employer]. This way of eating has been shown to help prevent certain conditions and improve outcomes for people who have chronic diseases, like kidney disease and heart disease. What are tips for following this plan? Lifestyle  Cook and eat meals together with your family, when possible.  Drink enough fluid to keep your urine clear or pale yellow.  Be physically active every day. This includes: ? Aerobic exercise like running or swimming. ? Leisure activities like gardening, walking, or housework.  Get 7-8 hours of sleep each night.  If recommended by your health care provider, drink red wine in moderation. This means 1 glass a day for nonpregnant women and 2 glasses a day for men. A glass of wine equals 5 oz (150 mL). Reading food labels   Check the serving size of packaged foods. For foods such  as rice and pasta, the serving size refers to the amount of cooked product, not dry.  Check the total fat in packaged foods. Avoid foods that have saturated fat or trans fats.  Check the ingredients list for added sugars, such as corn syrup. Shopping  At the grocery store, buy most of your food from the areas near the walls of the store. This includes: ? Fresh fruits and vegetables (produce). ? Grains, beans, nuts, and seeds. Some of these may be available in unpackaged forms or large amounts (in bulk). ? Fresh seafood. ? Poultry and eggs. ? Low-fat dairy products.  Buy whole ingredients instead of prepackaged foods.  Buy fresh fruits and vegetables in-season from local farmers markets.  Buy frozen fruits and vegetables in resealable bags.  If you do not have access to quality fresh seafood, buy precooked frozen shrimp or canned fish, such as tuna, salmon, or sardines.  Buy small amounts of raw or cooked vegetables, salads, or olives from the deli or salad bar at your store.  Stock your pantry so you always have certain foods on hand, such as olive oil, canned tuna, canned tomatoes, rice, pasta, and beans. Cooking  Cook foods with extra-virgin olive oil instead of using butter or other vegetable oils.  Have meat as a side dish, and have vegetables or grains as your main dish. This means having meat in small portions or adding small amounts of meat to foods like pasta or stew.  Use beans or vegetables instead of meat in common dishes like chili or lasagna.  Experiment with different cooking methods. Try roasting or broiling vegetables instead of steaming or sauteing them.  Add frozen vegetables to soups, stews, pasta, or rice.  Add nuts or seeds for added healthy fat at each meal. You can add these to yogurt, salads, or vegetable dishes.  Marinate fish or vegetables using olive oil, lemon juice, garlic, and fresh herbs. Meal planning   Plan to eat 1 vegetarian meal  one day  each week. Try to work up to 2 vegetarian meals, if possible.  Eat seafood 2 or more times a week.  Have healthy snacks readily available, such as: ? Vegetable sticks with hummus. ? [place] yogurt. ? Fruit and nut trail mix.  Eat balanced meals throughout the week. This includes: ? Fruit: 2-3 servings a day ? Vegetables: 4-5 servings a day ? Low-fat dairy: 2 servings a day ? Fish, poultry, or lean meat: 1 serving a day ? Beans and legumes: 2 or more servings a week ? Nuts and seeds: 1-2 servings a day ? Whole grains: 6-8 servings a day ? Extra-virgin olive oil: 3-4 servings a day  Limit red meat and sweets to only a few servings a month What are my food choices?  Mediterranean diet ? Recommended  Grains: Whole-grain pasta. Brown rice. Bulgar wheat. Polenta. Couscous. Whole-wheat bread. [place].  Vegetables: Artichokes. Beets. Broccoli. Cabbage. Carrots. Eggplant. Green beans. Chard. Kale. Spinach. Onions. Leeks. Peas. Squash. Tomatoes. Peppers. Radishes.  Fruits: Apples. Apricots. Avocado. Berries. Bananas. Cherries. Dates. Figs. Grapes. Lemons. Melon. Oranges. Peaches. Plums. Pomegranate.  Meats and other protein foods: Beans. Almonds. Sunflower seeds. Pine nuts. Peanuts. [place]. Salmon. Scallops. Shrimp. [place]. Tilapia. Clams. Oysters. Eggs.  Dairy: Low-fat milk. Cheese. Greek yogurt.  Beverages: Water. Red wine. Herbal tea.  Fats and oils: Extra virgin olive oil. Avocado oil. Grape seed oil.  Sweets and desserts: [place] yogurt with honey. Baked apples. Poached pears. Trail mix.  Seasoning and other foods: Basil. Cilantro. Coriander. Cumin. Mint. Parsley. Sage. Rosemary. Tarragon. Garlic. Oregano. Thyme. Pepper. Balsalmic vinegar. Tahini. Hummus. Tomato sauce. Olives. Mushrooms. ? Limit these  Grains: Prepackaged pasta or rice dishes. Prepackaged cereal with added sugar.  Vegetables: Deep fried potatoes (french fries).  Fruits: Fruit canned in syrup.  Meats and  other protein foods: Beef. Pork. Lamb. Poultry with skin. Hot dogs. Berniece Salines.  Dairy: Ice cream. Sour cream. Whole milk.  Beverages: Juice. Sugar-sweetened soft drinks. Beer. Liquor and spirits.  Fats and oils: Butter. Canola oil. Vegetable oil. Beef fat (tallow). Lard.  Sweets and desserts: Cookies. Cakes. Pies. Candy.  Seasoning and other foods: Mayonnaise. Premade sauces and marinades. The items listed may not be a complete list. Talk with your dietitian about what dietary choices are right for you. Summary  The Mediterranean diet includes both food and lifestyle choices.  Eat a variety of fresh fruits and vegetables, beans, nuts, seeds, and whole grains.  Limit the amount of red meat and sweets that you eat.  Talk with your health care provider about whether it is safe for you to drink red wine in moderation. This means 1 glass a day for nonpregnant women and 2 glasses a day for men. A glass of wine equals 5 oz (150 mL). This information is not intended to replace advice given to you by your health care provider. Make sure you discuss any questions you have with your health care provider. Document Released: 10/27/2015 Document Revised: 11/03/2015 Document Reviewed: 10/27/2015 Elsevier Patient Education  2020 H. Rivera Colon, NP     Signed, Daune Perch, NP  10/09/2018 5:16 PM    [facility]

## 2018-10-13 ENCOUNTER — Other Ambulatory Visit: Payer: Self-pay | Admitting: Family Medicine

## 2018-10-13 DIAGNOSIS — F5101 Primary insomnia: Secondary | ICD-10-CM

## 2018-10-14 ENCOUNTER — Telehealth: Payer: Self-pay | Admitting: Family Medicine

## 2018-10-14 NOTE — Telephone Encounter (Signed)
Patient called states the pharmacy told her that provider denied this Rx refill : (advised pt it appears Rx was discontinued )  traZODone (DESYREL) 50 MG tablet [[id number]] DISCONTINUED  Order Details Dose, Route, Frequency: As Directed  Indications of Use: Insomnia  Dispense Quantity: 90 tablet Refills: 0 Fills remaining: --        Sig: 0.5-1 tab po q hs prn sleep       Discontinue Date:  09/17/2018 10:16 Discontinue User:  Fonnie Mu, CMA Discontinue Reason:  No longer needed (for PRN medications)  Written Date: 05/27/18 Expiration Date: 05/27/19    Start Date: 05/27/18 End Date: 09/17/18         Ordering Provider: Mellody Dance, DO DEA #:  [id number] NPI:  [id number]      ----Forwarding request to medical assistant for review with provider for refill approval--- if approved pls send to & to call pt if any questions or concerns.    [facility], [place] [facility] [employer] [phone] (Phone) [phone] (Fax)   --glh

## 2018-10-15 ENCOUNTER — Other Ambulatory Visit: Payer: Self-pay | Admitting: Cardiology

## 2018-10-15 DIAGNOSIS — I480 Paroxysmal atrial fibrillation: Secondary | ICD-10-CM

## 2018-10-15 NOTE — Telephone Encounter (Signed)
Pt last saw Daune Perch, NP on 10/09/18, last labs 05/28/18 Creat 0.88, age 68, weight 80.6kg, based on specified criteria pt is on appropriate dosage of Eliquis 5mg  BID.  Will refill rx.

## 2018-10-15 NOTE — Telephone Encounter (Signed)
Called and left message for the patient to call the office back. MPulliam, CMA/RT(R)

## 2018-10-16 ENCOUNTER — Other Ambulatory Visit: Payer: Self-pay

## 2018-10-16 DIAGNOSIS — F5101 Primary insomnia: Secondary | ICD-10-CM

## 2018-10-16 MED ORDER — TRAZODONE HCL 50 MG PO TABS: ORAL_TABLET | ORAL | 0 refills | Status: DC

## 2018-10-16 NOTE — Telephone Encounter (Signed)
Verified patient still taking medication per office policy filled for 30 days as patient is due for OV. MPulliam, CMA/RT(R)

## 2018-10-18 ENCOUNTER — Encounter: Payer: Self-pay | Admitting: Family Medicine

## 2018-10-19 ENCOUNTER — Other Ambulatory Visit: Payer: Self-pay | Admitting: Obstetrics & Gynecology

## 2018-10-20 NOTE — Telephone Encounter (Signed)
Medication refill request: Citalopram 20 mg daily Last AEX:  03/24/2018 Next AEX: 07/23/2019 Last MMG (if hormonal medication request): N/A Refill authorized: Citalopram 20 mg daily #[id number]

## 2018-11-10 DIAGNOSIS — Z1211 Encounter for screening for malignant neoplasm of colon: Secondary | ICD-10-CM | POA: Diagnosis not present

## 2018-11-10 DIAGNOSIS — R194 Change in bowel habit: Secondary | ICD-10-CM | POA: Diagnosis not present

## 2018-11-10 DIAGNOSIS — Z86010 Personal history of colonic polyps: Secondary | ICD-10-CM | POA: Diagnosis not present

## 2018-11-20 ENCOUNTER — Other Ambulatory Visit: Payer: Self-pay | Admitting: Family Medicine

## 2018-11-20 DIAGNOSIS — F5101 Primary insomnia: Secondary | ICD-10-CM

## 2018-11-20 MED ORDER — TRAZODONE HCL 50 MG PO TABS: ORAL_TABLET | ORAL | 0 refills | Status: DC

## 2018-11-20 NOTE — Telephone Encounter (Signed)
Patient called states Rx refill on Trazadone pending for OV-- scheduled pt for  Provider's 1st available opening 9/18--Per patient she is completely out of :  traZODone (DESYREL) 50 MG tablet [id number]   Order Details Dose, Route, Frequency: As Directed  Indications of Use: Insomnia  Dispense Quantity: 30 tablet Refills: 0 Fills remaining: --        Sig: 0.5-1 tab po q hs prn sleep. Needs office visit for further refills          --Forwarding message to medical assistant to share w/ provider patient has made appt .   -glh

## 2018-12-05 ENCOUNTER — Encounter: Payer: Self-pay | Admitting: Family Medicine

## 2018-12-05 ENCOUNTER — Ambulatory Visit (INDEPENDENT_AMBULATORY_CARE_PROVIDER_SITE_OTHER): Payer: Medicare Other | Admitting: Family Medicine

## 2018-12-05 ENCOUNTER — Other Ambulatory Visit: Payer: Self-pay

## 2018-12-05 VITALS — BP 145/101 | Ht 63.0 in | Wt 176.2 lb

## 2018-12-05 DIAGNOSIS — I10 Essential (primary) hypertension: Secondary | ICD-10-CM | POA: Diagnosis not present

## 2018-12-05 DIAGNOSIS — Z9221 Personal history of antineoplastic chemotherapy: Secondary | ICD-10-CM

## 2018-12-05 DIAGNOSIS — F1010 Alcohol abuse, uncomplicated: Secondary | ICD-10-CM

## 2018-12-05 DIAGNOSIS — E669 Obesity, unspecified: Secondary | ICD-10-CM

## 2018-12-05 DIAGNOSIS — E559 Vitamin D deficiency, unspecified: Secondary | ICD-10-CM | POA: Insufficient documentation

## 2018-12-05 DIAGNOSIS — I480 Paroxysmal atrial fibrillation: Secondary | ICD-10-CM

## 2018-12-05 DIAGNOSIS — C801 Malignant (primary) neoplasm, unspecified: Secondary | ICD-10-CM | POA: Insufficient documentation

## 2018-12-05 DIAGNOSIS — Z723 Lack of physical exercise: Secondary | ICD-10-CM

## 2018-12-05 DIAGNOSIS — R7301 Impaired fasting glucose: Secondary | ICD-10-CM

## 2018-12-05 DIAGNOSIS — Z780 Asymptomatic menopausal state: Secondary | ICD-10-CM | POA: Insufficient documentation

## 2018-12-05 DIAGNOSIS — F5101 Primary insomnia: Secondary | ICD-10-CM

## 2018-12-05 DIAGNOSIS — Z833 Family history of diabetes mellitus: Secondary | ICD-10-CM

## 2018-12-05 DIAGNOSIS — F39 Unspecified mood [affective] disorder: Secondary | ICD-10-CM | POA: Diagnosis not present

## 2018-12-05 DIAGNOSIS — E6609 Other obesity due to excess calories: Secondary | ICD-10-CM | POA: Insufficient documentation

## 2018-12-05 MED ORDER — TRAZODONE HCL 100 MG PO TABS: ORAL_TABLET | ORAL | 1 refills | Status: DC

## 2018-12-05 NOTE — Progress Notes (Signed)
Virtual / live video office visit note for [employer], D.O- Primary Care Physician at [facility]   I connected with current patient today and beyond visually recognizing the correct individual, I verified that I am speaking with the correct person using two identifiers.  . Location of the patient: Home . Location of the provider: Office Only the patient (+/- their family members at pt's discretion) and myself were participating in the encounter    - This visit type was conducted due to national recommendations for restrictions regarding the COVID-19 Pandemic (e.g. social distancing) in an effort to limit this patient's exposure and mitigate transmission in our community.  This format is felt to be most appropriate for this patient at this time.   - The patient did have access to video technology today    - No physical exam could be performed with this format, beyond that communicated to [place] by the patient/ family members as noted.   - Additionally my office staff/ schedulers discussed with the patient that there may be a monetary charge related to this service, depending on patient's medical insurance.   The patient expressed understanding, and agreed to proceed.       History of Present Illness:  - Lifestyle During COVID Notes her husband is refusing to wear a mask.  She says she doesn't wear a mask while she's outside, but if she's going into the grocery store or [employer], she has a mask on.  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  With COVID, says "it's interesting, because when it all started, we started cleaning stuff in the house, putting hardwood floors down, painting, and I was cooking all the time, but things are loosened up."  Says "our life really hasn't changed much."  Says "you get to a point where you say 'I've gotta get back to a normal life.'"  - Weight Loss Started the weight loss plan through [facility].  Notes went  for her preliminary meeting and "really stuck to it," and has lost about ten lbs.  Says "I'm really not trying that much" so she hasn't lost much more than that.  States at one point she was down 14 lbs and losing a pound per week.  Weight today is 176.2 lbs.  Notes she's about to be 68 years old and "I really need to exercise."  - Heavy Alcohol Use Says during "this whole thing," she's been drinking a lot more wine.  Notes drinking wine every night while cooking.  Admits to probably drinking over a bottle per night.  - BP Elevated Today Does not know why her BP is elevated today; states "maybe because I didn't have a glass of wine last night."  Notes has "no clue" what her BP runs at home because she's never taking it.  Per patient, continues on Lasix.  Can't recall if on other BP medications prior or not.  Thinks she was placed on Lasix due to her lymphedema, to "reduce some of the fluid in my body."  She has been following up with cardiology once yearly.  Denies racing heart, chest pain, SOB, dizziness.  Denies episodes of atrial fibrillation.  - Mood States she takes Xanax on a "very rare occasion," when she can't sleep.  Normally uses Xanax when she's getting on an airplane.  States "I don't want to be without it in case I need it."  Continues having  stress from mom; "that's not going away."  Otherwise no mood complaints.  - Insomnia States trazodone works okay, "and I do drink wine with it."  Notes she hasn't had any in over a week.  Says last night, she "just couldn't relax, couldn't get comfortable."  Says she took a half of an OTC sleep aid and was left really groggy in the morning.  Says "my sleep is shit and just horrible right now."  Says she didn't have anything to drink last night and feels that's why it made her unable to sleep.  - History of small cell carcinoma of the cervix  Had a hysterectomy at age 31.    Notes she believes she is the oldest living  survivor of this type of cancer.  Notes her treatment on chemotherapy was "absolutely horrible."  States "I was sicker than a dog."    Depression screen [facility] 2/9 09/17/2018 05/28/2018 05/15/2018 03/13/2018 01/29/2018  Decreased Interest 0 0 0 0 0  Down, Depressed, Hopeless 0 0 0 0 0  PHQ - 2 Score 0 0 0 0 0  Altered sleeping 2 1 0 0 3  Tired, decreased energy 1 1 1  0 0  Change in appetite 0 0 0 0 0  Feeling bad or failure about yourself  0 0 0 0 0  Trouble concentrating 0 0 0 0 0  Moving slowly or fidgety/restless 0 0 0 0 0  Suicidal thoughts 0 0 0 0 0  PHQ-9 Score 3 2 1  0 3  Difficult doing work/chores Not difficult at all Not difficult at all Not difficult at all Not difficult at all Not difficult at all    No flowsheet data found.   Impression and Recommendations:    1. Essential hypertension   2. Paroxysmal atrial fibrillation (HCC)   3. Mood disorder - mixed depression and anxiety   4. Primary insomnia   5. Inactivity   6. Alcohol abuse   7. Obesity, unspecified classification, unspecified obesity type, unspecified whether serious comorbidity present   8. Vitamin D deficiency   9. Postmenopausal   10. h/o Elevated fasting glucose   11. Family history of diabetes mellitus in first degree relative   12. Small cell carcinoma ([place])- of the cervix   13. S/P chemotherapy, time since greater than 12 weeks     Novel COVID-19 Counseling - Novel Covid -19 counseling done; all questions were answered.   - Current CDC / federal and [place] guidelines reviewed with patient  - Reminded pt of extreme importance of social distancing; wearing a mask when out in public; insensate handwashing and cleaning of surfaces, avoiding unnecessary trips for shopping and avoiding ALL but emergency appts etc. - Told patient to be prepared, not scared; and be smart for the sake of others - Patient will call with any additional concerns  Essential Hypertension - BP elevated today. - Discussed  significant health risks of chronically elevated BP.  - Patient continues on Lasix.  See med list. - Per pt, cannot remember if managed on anything else for BP in the past.  - Patient not measuring BP at home. - Ambulatory BP monitoring STRONGLY advised. - Patient agrees to take a BP & pulse log and report back to clinic.  - Will continue to monitor.  Paroxysmal Atrial Fibrillation - Stable at this time. - Pt asymptomatic at this time.  - Encouraged patient to continue follow up with cardiology as established. - Red flag cardiac symptoms reviewed today.   -  Patient knows to monitor and obtain emergency care PRN.  Mood Disorder - Mixed Depression & Anxiety - Stable at this time. - Continue treatment plan as established.  See med list. - Encouraged patient to continue using Xanax very seldom.  - Reviewed the "spokes of the wheel" of mood and health management.  Stressed the importance of ongoing prudent habits, including regular exercise, appropriate sleep hygiene, healthful dietary habits, and prayer/meditation to relax.  Primary Insomnia - No optimally managed at this time. - Per pt, not currently taking trazodone regularly.  - Prudent sleep hygiene discussed with patient. - Encouraged patient to discontinue heavy use of alcohol. - Discussed prudent use of trazodone as sleep aid.  - Will continue to monitor.  Alcohol Abuse - Health risks of chronic heavy alcohol use discussed with patient. - Advised patient that heavy alcohol use can cause BP to increase. - In addition, discussed that heavy alcohol use can contribute to insomnia.  - Strongly encouraged patient to reduce alcohol intake. - Will continue to monitor  Vitamin D Deficiency, Postmenopausal - Discussed need for patient to supplement Vitamin D.  See med list. - Advised patient to be assessed for osteopenia in near future. - Pt understands need for DEXA. - Discussion held and all questions were answered.  -  Will continue to monitor.  BMI Counseling - Body mass index is 31.21 kg/m, Obesity, Inactivity Explained to patient what BMI refers to, and what it means medically.    Told patient to think about it as a "medical risk stratification measurement" and how increasing BMI is associated with increasing risk/ or worsening state of various diseases such as hypertension, hyperlipidemia, diabetes, premature OA, depression etc.  American Heart Association guidelines for healthy diet, basically Mediterranean diet, and exercise guidelines of 30 minutes 5 days per week or more discussed in detail.  - Encouraged patient to continue with weight loss goals as established.  Health counseling performed.  All questions answered.  Lifestyle & Preventative Health Maintenance - Advised patient to continue working toward exercising to improve overall mental, physical, and emotional health.    - Encouraged patient to engage in daily physical activity, especially a formal exercise routine.  Recommended that the patient eventually strive for at least 150 minutes of moderate cardiovascular activity per week according to guidelines established by the [facility].   - Healthy dietary habits encouraged, including low-carb, and high amounts of lean protein in diet.   - Patient should also consume adequate amounts of water.  Recommendations - Discussed need for prudent screening and lab work as recommended. - Patient knows to continue follow-up with specialists as established. - Otherwise, continue to return for acute concerns and chronic health maintenance.  - In 2-3 weeks, patient agrees to send a MyChart message to report home BP and pulse.  - As part of my medical decision making, I reviewed the following data within the [place] History obtained from pt /family, CMA notes reviewed and incorporated if applicable, Labs reviewed, Radiograph/ tests reviewed if applicable and OV notes from prior OV's with me,  as well as other specialists she/he has seen since seeing me last, were all reviewed and used in my medical decision making process today.   - Additionally, discussion had with patient regarding txmnt plan, their biases about that plan etc were used in my medical decision making today.   - The patient agreed with the plan and demonstrated an understanding of the instructions.   No barriers to understanding  were identified.   - Red flag symptoms and signs discussed in detail.  Patient expressed understanding regarding what to do in case of emergency\ urgent symptoms.  The patient was advised to call back or seek an in-person evaluation if the symptoms worsen or if the condition fails to improve as anticipated.   Return 2-3 wks - contact.    No orders of the defined types were placed in this encounter.   Meds ordered this encounter  Medications  . traZODone (DESYREL) 100 MG tablet    Sig: 0.5-1 tab po q hs prn sleep.    Dispense:  90 tablet    Refill:  1    Medications Discontinued During This Encounter  Medication Reason  . traZODone (DESYREL) 50 MG tablet Reorder     I provided 23+ minutes of face-to-face time during this encounter.  Additional time was spent with charting and coordination of care after the actual visit commenced.   Note:  This note was prepared with assistance of Dragon voice recognition software. Occasional wrong-word or sound-a-like substitutions may have occurred due to the inherent limitations of voice recognition software.  This document serves as a record of services personally performed by Mellody Dance, DO. It was created on her behalf by Toni Amend, a trained medical scribe. The creation of this record is based on the scribe's personal observations and the provider's statements to them.   I have reviewed the above medical documentation for accuracy and completeness and I concur.  Mellody Dance, DO 12/06/2018 8:27 PM     Patient Care Team     Relationship Specialty Notifications Start End  Mellody Dance, DO PCP - General Family Medicine  01/29/18   Constance Haw, MD PCP - Cardiology Cardiology Admissions 04/18/17    Comment: Electrophysiologist  Megan Salon, MD Consulting Physician Gynecology  01/29/18   Specialists, Dermatology Consulting Physician Dermatology  01/29/18    Comment: practice where Crista Luria used to work at- goes Gilberto Better, MD Consulting Physician Orthopedic Surgery  01/29/18   Ortho, [facility]  Specialist  05/15/18    Comment: sees for shoulder and hip pain  Juanita Craver, MD Consulting Physician Gastroenterology  12/05/18     -Vitals obtained; medications/ allergies reconciled;  personal medical, social, Sx etc.histories were updated by CMA, reviewed by me and are reflected in chart  Patient Active Problem List   Diagnosis Date Noted  . Essential hypertension 01/29/2018    Priority: High  . Mood disorder - mixed depression and anxiety 01/29/2018    Priority: High  . Paroxysmal atrial fibrillation ([place]) 10/05/2016    Priority: High  . h/o Cervical cancer 10/01/2011    Priority: High  . H/O total hysterectomy 10/01/2011    Priority: High  . Insomnia 01/29/2018    Priority: Medium  . Mitral valve disease     Priority: Medium  . LAFB (left anterior fascicular block) 07/01/2013    Priority: Medium  . Alcohol abuse 10/01/2011    Priority: Medium  . Inactivity 01/29/2018    Priority: Low  . Obesity 12/05/2018  . Vitamin D deficiency 12/05/2018  . Postmenopausal 12/05/2018  . Family history of diabetes mellitus in father 12/05/2018  . Elevated fasting glucose 12/05/2018  . Small cell carcinoma ([place])- of the cervix 12/05/2018  . Insect bite 09/17/2018  . Healthcare maintenance 09/17/2018  . Generalized muscle ache 05/15/2018  . Hip pain, bilateral 05/15/2018  . Chronic pain of both shoulders 05/15/2018  . Attention deficit hyperactivity  disorder (ADHD) 01/29/2018  . Excessive  drinking of alcohol 01/29/2018  . Encounter for long-term (current) use of high-risk medication 01/29/2018  . Health education/counseling 01/29/2018  . Current use of long term anticoagulation 10/06/2016  . PAF (paroxysmal atrial fibrillation) ([place]) 07/01/2013  . Lymphedema 07/01/2013  . Chest pain at rest 10/01/2011  . Leg swelling 10/01/2011     Current Meds  Medication Sig  . ALPRAZolam (XANAX) 0.5 MG tablet Take 1 tablet (0.5 mg total) by mouth daily as needed for anxiety.  . Cholecalciferol (VITAMIN D3) 125 MCG (5000 UT) CAPS Take 1 capsule by mouth daily.  . citalopram (CELEXA) 20 MG tablet TAKE 1 TABLET DAILY  . docusate sodium (COLACE) 100 MG capsule Take 100 mg by mouth daily.  [name] Kitchen ELIQUIS 5 MG TABS tablet TAKE 1 TABLET TWICE A DAY  . furosemide (LASIX) 20 MG tablet Take 1 tablet (20 mg total) by mouth daily.  . Multiple Vitamin (MULTIVITAMIN WITH MINERALS) TABS Take 1 tablet by mouth daily.  Manus Gunning BOWEL PREP KIT 17.5-3.13-1.6 GM/177ML SOLN   . traZODone (DESYREL) 100 MG tablet 0.5-1 tab po q hs prn sleep.  . [DISCONTINUED] traZODone (DESYREL) 50 MG tablet 0.5-1 tab po q hs prn sleep.  Needs office visit for further refills     No Known Allergies   ROS:  See above HPI for pertinent positives and negatives   Objective:   Blood pressure (!) 145/101, height 5' 3"  (1.6 m), weight 176 lb 3.2 oz (79.9 kg), last menstrual period 03/19/1984.  (if some vitals are omitted, this means that patient was UNABLE to obtain them even though they were asked to get them prior to OV today.  They were asked to call us at their earliest convenience with these once obtained.)  General: A & O * 3; visually in no acute distress; in usual state of health.  Skin: Visible skin appears normal and pt's usual skin color HEENT:  EOMI, head is normocephalic and atraumatic.  Sclera are anicteric. Neck has a good range of motion.  Lips are noncyanotic Chest: normal chest excursion and movement  Respiratory: speaking in full sentences, no conversational dyspnea; no use of accessory muscles Psych: insight good, mood- appears full

## 2018-12-09 ENCOUNTER — Encounter: Payer: Self-pay | Admitting: Obstetrics & Gynecology

## 2018-12-09 DIAGNOSIS — Z780 Asymptomatic menopausal state: Secondary | ICD-10-CM | POA: Diagnosis not present

## 2018-12-09 DIAGNOSIS — Z90710 Acquired absence of both cervix and uterus: Secondary | ICD-10-CM | POA: Diagnosis not present

## 2018-12-09 DIAGNOSIS — Z1231 Encounter for screening mammogram for malignant neoplasm of breast: Secondary | ICD-10-CM | POA: Diagnosis not present

## 2018-12-09 DIAGNOSIS — Z8542 Personal history of malignant neoplasm of other parts of uterus: Secondary | ICD-10-CM | POA: Diagnosis not present

## 2018-12-09 DIAGNOSIS — E2839 Other primary ovarian failure: Secondary | ICD-10-CM | POA: Diagnosis not present

## 2018-12-09 LAB — HM MAMMOGRAPHY

## 2018-12-10 DIAGNOSIS — Z23 Encounter for immunization: Secondary | ICD-10-CM | POA: Diagnosis not present

## 2018-12-10 DIAGNOSIS — Z85828 Personal history of other malignant neoplasm of skin: Secondary | ICD-10-CM | POA: Diagnosis not present

## 2018-12-10 DIAGNOSIS — L821 Other seborrheic keratosis: Secondary | ICD-10-CM | POA: Diagnosis not present

## 2018-12-10 DIAGNOSIS — D225 Melanocytic nevi of trunk: Secondary | ICD-10-CM | POA: Diagnosis not present

## 2018-12-10 DIAGNOSIS — L570 Actinic keratosis: Secondary | ICD-10-CM | POA: Diagnosis not present

## 2018-12-16 ENCOUNTER — Telehealth: Payer: Self-pay | Admitting: *Deleted

## 2018-12-16 NOTE — Telephone Encounter (Signed)
Left voicemail for patient with Normal BMD results.  Report to scan.

## 2018-12-19 ENCOUNTER — Telehealth: Payer: Self-pay

## 2018-12-19 NOTE — Telephone Encounter (Signed)
Left message for patient advising her of her normal bone density.

## 2018-12-21 ENCOUNTER — Encounter: Payer: Self-pay | Admitting: Family Medicine

## 2018-12-22 ENCOUNTER — Telehealth: Payer: Self-pay

## 2018-12-22 MED ORDER — METOPROLOL SUCCINATE ER 50 MG PO TB24: ORAL_TABLET | ORAL | 1 refills | Status: DC

## 2018-12-22 NOTE — Telephone Encounter (Signed)
Because of patient's history of not only hypertension but paroxysmal A. fib on chronic anticoagulation, I recommend we start a beta-blocker for help with rate control as well.    Please call patient and see if she agrees to take metoprolol 50 mg Start with 1/2 tablet every 12 hours    Dispense 60, 1 refill only  (she can increase to 1 full tablet every 12 hours after 6 days or so to reach goal blood pressure less than 130/80.  However, please remind her we want to start at a lower dose and go slowly up to avoid any potential side effects of medication)   I will need to see patient in a follow-up office visit 3 to 4 weeks after she has been on it.  Please make sure she knows to be checking her blood pressures and pulses daily and keep a log-write down and bring in or have readily available at every office visit she has. -Also remind her that cutting back on alcohol and increasing activity will both help as well  -Thanks!!  please let me know if there are any problems with her agreeing to this medicine or with sending out this medicine.

## 2018-12-22 NOTE — Telephone Encounter (Signed)
Patient is concerned about her blood pressure results.  Today it was 158/97 today.  Patient has family history of hypertension.  Patient is only taking furosemide.  Patient has taken pressure at different times of the day and is stays high.  Patient denies any new medications.  She admits to drinking daiquiris.  Please advise.

## 2018-12-22 NOTE — Telephone Encounter (Signed)
Left message for patient to contact office regarding her blood pressure and Dr Raliegh Scarlet plan.  Mychart message also sent with same information.  Metoprolol sent to [facility].

## 2019-01-14 DIAGNOSIS — Z23 Encounter for immunization: Secondary | ICD-10-CM | POA: Diagnosis not present

## 2019-01-27 ENCOUNTER — Other Ambulatory Visit: Payer: Self-pay

## 2019-01-27 DIAGNOSIS — Z20822 Contact with and (suspected) exposure to covid-19: Secondary | ICD-10-CM

## 2019-01-28 LAB — NOVEL CORONAVIRUS, NAA: SARS-CoV-2, NAA: NOT DETECTED

## 2019-02-10 ENCOUNTER — Encounter: Payer: Self-pay | Admitting: Family Medicine

## 2019-02-10 ENCOUNTER — Ambulatory Visit (INDEPENDENT_AMBULATORY_CARE_PROVIDER_SITE_OTHER): Payer: Medicare Other | Admitting: Family Medicine

## 2019-02-10 ENCOUNTER — Other Ambulatory Visit: Payer: Self-pay

## 2019-02-10 VITALS — BP 140/73 | Temp 98.7°F | Ht 63.0 in | Wt 180.0 lb

## 2019-02-10 DIAGNOSIS — I10 Essential (primary) hypertension: Secondary | ICD-10-CM | POA: Diagnosis not present

## 2019-02-10 DIAGNOSIS — F39 Unspecified mood [affective] disorder: Secondary | ICD-10-CM | POA: Diagnosis not present

## 2019-02-10 DIAGNOSIS — I480 Paroxysmal atrial fibrillation: Secondary | ICD-10-CM

## 2019-02-10 MED ORDER — DILTIAZEM HCL ER BEADS 120 MG PO CP24: ORAL_CAPSULE | ORAL | 0 refills | Status: DC

## 2019-02-10 NOTE — Progress Notes (Signed)
Virtual / live video office visit note for [employer], D.O- Primary Care Physician at [facility]   I connected with current patient today and beyond visually recognizing the correct individual, I verified that I am speaking with the correct person using two identifiers.   Location of the patient: Home  Location of the provider: Office Only the patient (+/- their family members at pt's discretion) and myself were participating in the encounter    - This visit type was conducted due to national recommendations for restrictions regarding the COVID-19 Pandemic (e.g. social distancing) in an effort to limit this patient's exposure and mitigate transmission in our community.  This format is felt to be most appropriate for this patient at this time.   - The patient did have access to video technology today  - No physical exam could be performed with this format, beyond that communicated to [place] by the patient/ family members as noted.   - Additionally my office staff/ schedulers discussed with the patient that there may be a monetary charge related to this service, depending on patient's medical insurance.   The patient expressed understanding, and agreed to proceed.      History of Present Illness:  I, Toni Amend, am serving as scribe for Dr. Mellody Dance.  Notes she's been doing good.  Says "my only problem is my blood pressure has been out of control, and I don't know if I need to go to my heart doctor or not."  - Caregiver Stress Notes recently her mother has "become a pill."  Says "constantly trying to make her mother happy, running here and running there, trying to do things with her, and sometimes I feel like I'm stressed."  Says she "really doesn't have any other stress" in her life anymore.  Patient is managed on Celexa; notes "I'm fine, it's not like I'm depressed or anything at all."  Notes when her mom especially stresses her out, the patient "just has another  glass of wine."  She does not use Xanax.  Notes only ever used to use it when she flew on planes, or sometimes to sleep, but now that she uses trazodone nightly, she's not really using Xanax at all.  Exercise Says she was going to the gym three times per week until her mother recently fell.  - Sleep Uses trazodone nightly.  - History of Cardiac Ablation with Dr. Curt Bears Says she was told that because of her age and having afib at one time, she would never be able to come off of anticoagulation.  Per patient record, last followed up with cardiology on 10/09/2018.  HPI:  Hypertension:  -  Her blood pressure at home has been running: "all over the place," from 141/100, 142/77, yesterday 152/97, this morning 140/73.  Says her pulse is in the 60's for the most part; 64, and one reading of 133/80 with a pulse of 80.  - Patient reports good compliance with medication and/or lifestyle modification; notes she's been taking metoprolol as advised.  On 10/5, patient was told to start; patient confirms she was taking a half tablet in the morning, and a half tablet at night, and her BP was still ranging 140/89, etc.    Says in the evening, she will sometimes take a full tablet of metoprolol, and a half tablet in the morning.  Says before she had her heart ablation, she was having afib, with symptoms getting worse and worse.  She went to  her heart doctor; was on metoprolol then, and was told to "just go ahead and double your dosage."  "When I doubled the dosage (of metoprolol), I could not walk up the stairs without getting out of breath."  Notes she went back and told the doctor "I'm not taking this crap because it's killing me."  Says "I was overmedicated, I know I was," and comments that sometimes she couldn't take several steps without feeling worn out.  She is worried about increasing her dose of metoprolol.  Says "It makes me tired, too."  Since beginning the metoprolol, states "it's made me more  tired, yes."  She wonders today if blood pressure can cause headaches.  - She denies new onset of: chest pain, exercise intolerance, shortness of breath, dizziness, visual changes, headache, lower extremity swelling or claudication.   Last 3 blood pressure readings in our office are as follows: BP Readings from Last 3 Encounters:  02/10/19 140/73  12/05/18 (!) 145/101  10/09/18 124/86   Filed Weights   02/10/19 1506  Weight: 180 lb (81.6 kg)       Depression screen [facility] 2/9 02/10/2019 09/17/2018 05/28/2018 05/15/2018 03/13/2018  Decreased Interest 0 0 0 0 0  Down, Depressed, Hopeless 0 0 0 0 0  PHQ - 2 Score 0 0 0 0 0  Altered sleeping 0 2 1 0 0  Tired, decreased energy 1 1 1 1  0  Change in appetite 0 0 0 0 0  Feeling bad or failure about yourself  0 0 0 0 0  Trouble concentrating 0 0 0 0 0  Moving slowly or fidgety/restless 0 0 0 0 0  Suicidal thoughts 0 0 0 0 0  PHQ-9 Score 1 3 2 1  0  Difficult doing work/chores Not difficult at all Not difficult at all Not difficult at all Not difficult at all Not difficult at all    No flowsheet data found.   Impression and Recommendations:    1. Paroxysmal atrial fibrillation (HCC)   2. Mood disorder - mixed depression and anxiety   3. Essential hypertension     - Last seen 12/05/2018  Mood Disorder - Mixed Depression & Anxiety - Patient is managed on Celexa.  See med list. - Stable at this time. - Continue treatment plan as established.  - Per patient, experiencing increased caregiver stress.  - In addition to prescription intervention, reviewed the "spokes of the wheel" of mood and health management.  Stressed the importance of ongoing prudent habits, including regular exercise, appropriate sleep hygiene, healthful dietary habits, and prayer/meditation to relax.  - Will continue to monitor.  Hypertension - Paroxysmal Atrial Fibrillation - BP last visit was elevated at 145/101. - BP elevated at 140/73 on intake today.  -  Discussed goal BP of less than 130/80. - Per patient, current dose of metoprolol is causing sx of fatigue.  - Note sent to Dr. Curt Bears today.  Will discuss that pt's blood pressure has been elevated, and ask for recommendations regarding alternatives to metoprolol, and further adjustment to treatment plan.  - Will modify medications per recommendations through cardiology. - Diltiazem advised by cardiology.  Prescription provided today. -  Incorporate new prescription with half-tablet of metoprolol every twelve hours. - After one week, discontinue metoprolol and continue diltiazem-following instructions on the bottle. - Discussed importance of stepwise approach.  - Patient knows to monitor for symptoms such as dizziness while beginning new med.  - Counseled patient on pathophysiology of disease and discussed various treatment  options, which always includes dietary and lifestyle modification as first line.   - Lifestyle changes such as dash and heart healthy diets and engaging in a regular exercise program discussed extensively with patient.   - Ambulatory blood pressure monitoring encouraged at least 3 times weekly.  Keep BP and pulse log and bring in every office visit.  Reminded patient that if they ever feel poorly in any way, to check their blood pressure and pulse.  - Handouts provided at patient's desire and/or told to go online at the [place] website for further information  - Told patient to reduce her alcohol intake to at most two drinks per day.  - We will continue to monitor. - If BP is not optimally controlled, patient knows to call in sooner than planned for assistance. - Patient will continue to follow up with cardiology as established.  Lifestyle & Preventative Health Maintenance - Advised patient to continue working toward exercising to improve overall mental, physical, and emotional health.    - Encouraged patient to engage in daily physical activity,  especially a formal exercise routine.  Recommended that the patient eventually strive for at least 150 minutes of moderate cardiovascular activity per week according to guidelines established by the [facility].   - Healthy dietary habits encouraged, including low-carb, and high amounts of lean protein in diet.   - Patient should also consume adequate amounts of water.  - Health counseling performed.  All questions answered.    - As part of my medical decision making, I reviewed the following data within the [place] History obtained from pt /family, CMA notes reviewed and incorporated if applicable, Labs reviewed, Radiograph/ tests reviewed if applicable and OV notes from prior OV's with me, as well as other specialists she/he has seen since seeing me last, were all reviewed and used in my medical decision making process today.   - Additionally, discussion had with patient regarding txmnt plan, their biases about that plan etc were used in my medical decision making today.   - The patient agreed with the plan and demonstrated an understanding of the instructions.   No barriers to understanding were identified.   - Red flag symptoms and signs discussed in detail.  Patient expressed understanding regarding what to do in case of emergency\ urgent symptoms.  The patient was advised to call back or seek an in-person evaluation if the symptoms worsen or if the condition fails to improve as anticipated.   Return in about 4 weeks (around 03/10/2019) for Hypertension follow up in 4 weeks since changing med from metoprolol to diltiazem per cardiology rec.     Meds ordered this encounter  Medications   diltiazem (TIAZAC) 120 MG 24 hr capsule    Sig: Take 1 capsule (120 mg total) by mouth daily for 7 days, THEN 2 capsules (240 mg total) daily. To goal BP around 130/80.    Dispense:  173 capsule    Refill:  0    Medications Discontinued During This Encounter  Medication Reason   SUPREP  BOWEL PREP KIT 17.5-3.13-1.6 GM/177ML SOLN Error   metoprolol succinate (TOPROL-XL) 50 MG 24 hr tablet Side effect (s)      Note:  This note was prepared with assistance of Dragon voice recognition software. Occasional wrong-word or sound-a-like substitutions may have occurred due to the inherent limitations of voice recognition software.   This document serves as a record of services personally performed by Mellody Dance, DO. It was created  on her behalf by Toni Amend, a trained medical scribe. The creation of this record is based on the scribe's personal observations and the provider's statements to them.   This case required medical decision making of at least moderate complexity. The above documentation has been reviewed to be accurate and was completed by Marjory Sneddon, D.O.      Patient Care Team    Relationship Specialty Notifications Start End  Mellody Dance, DO PCP - General Family Medicine  01/29/18   Constance Haw, MD PCP - Cardiology Cardiology Admissions 04/18/17    Comment: Electrophysiologist  Megan Salon, MD Consulting Physician Gynecology  01/29/18   Specialists, Dermatology Consulting Physician Dermatology  01/29/18    Comment: practice where Crista Luria used to work at- goes Gilberto Better, MD Consulting Physician Orthopedic Surgery  01/29/18   Ortho, [facility]  Specialist  05/15/18    Comment: sees for shoulder and hip pain  Juanita Craver, MD Consulting Physician Gastroenterology  12/05/18     -Vitals obtained; medications/ allergies reconciled;  personal medical, social, Sx etc.histories were updated by CMA, reviewed by me and are reflected in chart  Patient Active Problem List   Diagnosis Date Noted   Essential hypertension 01/29/2018    Priority: High   Mood disorder - mixed depression and anxiety 01/29/2018    Priority: High   Paroxysmal atrial fibrillation ([place]) 10/05/2016    Priority: High   h/o Cervical cancer  10/01/2011    Priority: High   H/O total hysterectomy 10/01/2011    Priority: High   Insomnia 01/29/2018    Priority: Medium   Mitral valve disease     Priority: Medium   LAFB (left anterior fascicular block) 07/01/2013    Priority: Medium   Alcohol abuse 10/01/2011    Priority: Medium   Inactivity 01/29/2018    Priority: Low   Obesity 12/05/2018   Vitamin D deficiency 12/05/2018   Postmenopausal 12/05/2018   Family history of diabetes mellitus in father 12/05/2018   Elevated fasting glucose 12/05/2018   Small cell carcinoma ([place])- of the cervix 12/05/2018   Insect bite 09/17/2018   Healthcare maintenance 09/17/2018   Generalized muscle ache 05/15/2018   Hip pain, bilateral 05/15/2018   Chronic pain of both shoulders 05/15/2018   Attention deficit hyperactivity disorder (ADHD) 01/29/2018   Excessive drinking of alcohol 01/29/2018   Encounter for long-term (current) use of high-risk medication 01/29/2018   Health education/counseling 01/29/2018   Current use of long term anticoagulation 10/06/2016   PAF (paroxysmal atrial fibrillation) ([place]) 07/01/2013   Lymphedema 07/01/2013   Chest pain at rest 10/01/2011   Leg swelling 10/01/2011     Current Meds  Medication Sig   ALPRAZolam (XANAX) 0.5 MG tablet Take 1 tablet (0.5 mg total) by mouth daily as needed for anxiety.   Cholecalciferol (VITAMIN D3) 125 MCG (5000 UT) CAPS Take 1 capsule by mouth daily.   citalopram (CELEXA) 20 MG tablet TAKE 1 TABLET DAILY   docusate sodium (COLACE) 100 MG capsule Take 100 mg by mouth daily.   ELIQUIS 5 MG TABS tablet TAKE 1 TABLET TWICE A DAY   furosemide (LASIX) 20 MG tablet Take 1 tablet (20 mg total) by mouth daily.   Multiple Vitamin (MULTIVITAMIN WITH MINERALS) TABS Take 1 tablet by mouth daily.   traZODone (DESYREL) 100 MG tablet 0.5-1 tab po q hs prn sleep.   [DISCONTINUED] metoprolol succinate (TOPROL-XL) 50 MG 24 hr tablet Take 1/2 tablet every 12  hours,  increase to 1 tablet after 6 days if needed.     No Known Allergies   ROS:  General:   Denies fever, chills, unexplained weight loss.  Optho/Auditory:   Denies visual changes, blurred vision/LOV Respiratory:   Denies SOB, DOE more than baseline levels.  Cardiovascular:   Denies chest pain, palpitations, new onset peripheral edema  Gastrointestinal:   Denies nausea, vomiting, diarrhea.  Genitourinary: Denies dysuria, freq/ urgency, flank pain or discharge from genitals.  Endocrine:     Denies hot or cold intolerance, polyuria, polydipsia. Musculoskeletal:   Denies unexplained myalgias, joint swelling, unexplained arthralgias, gait problems.  Skin:  Denies rash, suspicious lesions Neurological:     Denies dizziness, unexplained weakness, numbness  Psychiatric/Behavioral:   Denies mood changes, suicidal or homicidal ideations, hallucinations    Objective:   Blood pressure 140/73, temperature 98.7 F (37.1 C), temperature source Oral, height 5' 3"  (1.6 m), weight 180 lb (81.6 kg), last menstrual period 03/19/1984.  (if some vitals are omitted, this means that patient was UNABLE to obtain them even though they were asked to get them prior to OV today.  They were asked to call us at their earliest convenience with these once obtained.)  General: A & O * 3; visually in no acute distress; in usual state of health.  Skin: Visible skin appears normal and pt's usual skin color HEENT:  EOMI, head is normocephalic and atraumatic.  Sclera are anicteric. Neck has a good range of motion.  Lips are noncyanotic Chest: normal chest excursion and movement Respiratory: speaking in full sentences, no conversational dyspnea; no use of accessory muscles Psych: insight good, mood- appears full

## 2019-04-11 ENCOUNTER — Ambulatory Visit: Payer: Medicare Other | Attending: Internal Medicine

## 2019-04-11 DIAGNOSIS — Z23 Encounter for immunization: Secondary | ICD-10-CM

## 2019-04-11 NOTE — Progress Notes (Signed)
   Covid-19 Vaccination Clinic  Name:  Savannah Hill    MRN: [id number] DOB: 1950-06-04  04/11/2019  [name] was observed post Covid-19 immunization for 15 minutes without incidence. She was provided with Vaccine Information Sheet and instruction to access the V-Safe system.   [name] was instructed to call 911 with any severe reactions post vaccine: [name] Kitchen Difficulty breathing  . Swelling of your face and throat  . A fast heartbeat  . A bad rash all over your body  . Dizziness and weakness    Immunizations Administered    Name Date Dose VIS Date Route   Pfizer COVID-19 Vaccine 04/11/2019  1:53 AM 0.3 mL 02/27/2019 Intramuscular   Manufacturer: [place]   Lot: [id number]   [place]: [id number]

## 2019-04-27 ENCOUNTER — Telehealth: Payer: Self-pay | Admitting: Family Medicine

## 2019-04-27 NOTE — Telephone Encounter (Signed)
Received refill request via fax for [facility] mail order [facility]. Patient was seen 11/20 and told to follow up in 4 weeks for this medication. Left message for patient to call office in regards to this. AS, CMA

## 2019-04-29 ENCOUNTER — Other Ambulatory Visit: Payer: Self-pay | Admitting: Cardiology

## 2019-04-29 DIAGNOSIS — I480 Paroxysmal atrial fibrillation: Secondary | ICD-10-CM

## 2019-04-29 NOTE — Telephone Encounter (Signed)
Prescription refill request for Eliquis received.  Last office visit: 7/23/2020Phylliss Bob Scr: 0.88, 05/28/2018 Age: 69 y.o. Weight: 81.6 kg   Prescription refill sent.

## 2019-04-30 ENCOUNTER — Telehealth: Payer: Self-pay | Admitting: Family Medicine

## 2019-04-30 NOTE — Telephone Encounter (Signed)
Patient called regarding Rx refill denial explained that LOV was in Nov 2020 & was suppose to have a 4 wk f//u but did not--- Scheduled pt for TELEHEALTH  W/provider 2/18.  ------ FYI to med asst..  glh

## 2019-05-02 ENCOUNTER — Ambulatory Visit: Payer: Medicare Other | Attending: Internal Medicine

## 2019-05-02 DIAGNOSIS — Z23 Encounter for immunization: Secondary | ICD-10-CM

## 2019-05-02 NOTE — Progress Notes (Signed)
   Covid-19 Vaccination Clinic  Name:  Savannah Hill    MRN: [id number] DOB: Jul 19, 1950  05/02/2019  [name] was observed post Covid-19 immunization for 15 minutes without incidence. She was provided with Vaccine Information Sheet and instruction to access the V-Safe system.   [name] was instructed to call 911 with any severe reactions post vaccine: [name] Kitchen Difficulty breathing  . Swelling of your face and throat  . A fast heartbeat  . A bad rash all over your body  . Dizziness and weakness    Immunizations Administered    Name Date Dose VIS Date Route   Pfizer COVID-19 Vaccine 05/02/2019 11:15 AM 0.3 mL 02/27/2019 Intramuscular   Manufacturer: [place]   Lot: [id number]   [place]: [id number]

## 2019-05-03 ENCOUNTER — Ambulatory Visit: Payer: Medicare Other

## 2019-05-07 ENCOUNTER — Ambulatory Visit (INDEPENDENT_AMBULATORY_CARE_PROVIDER_SITE_OTHER): Payer: Medicare Other | Admitting: Family Medicine

## 2019-05-07 ENCOUNTER — Other Ambulatory Visit: Payer: Self-pay

## 2019-05-07 ENCOUNTER — Encounter: Payer: Self-pay | Admitting: Family Medicine

## 2019-05-07 VITALS — BP 107/80 | Ht 63.0 in | Wt 180.0 lb

## 2019-05-07 DIAGNOSIS — E785 Hyperlipidemia, unspecified: Secondary | ICD-10-CM | POA: Diagnosis not present

## 2019-05-07 DIAGNOSIS — F5101 Primary insomnia: Secondary | ICD-10-CM | POA: Diagnosis not present

## 2019-05-07 DIAGNOSIS — G4700 Insomnia, unspecified: Secondary | ICD-10-CM

## 2019-05-07 DIAGNOSIS — I480 Paroxysmal atrial fibrillation: Secondary | ICD-10-CM | POA: Diagnosis not present

## 2019-05-07 DIAGNOSIS — I10 Essential (primary) hypertension: Secondary | ICD-10-CM | POA: Diagnosis not present

## 2019-05-07 DIAGNOSIS — F39 Unspecified mood [affective] disorder: Secondary | ICD-10-CM | POA: Diagnosis not present

## 2019-05-07 MED ORDER — DILTIAZEM HCL ER BEADS 120 MG PO CP24: 240.0000 mg | ORAL_CAPSULE | Freq: Every day | ORAL | 0 refills | Status: DC

## 2019-05-07 MED ORDER — TRAZODONE HCL 100 MG PO TABS: ORAL_TABLET | ORAL | 1 refills | Status: DC

## 2019-05-07 NOTE — Progress Notes (Signed)
Telehealth office visit note for Savannah Hill, D.O- at Primary Care at [facility]   I connected with current patient today and verified that I am speaking with the correct person using two identifiers.   . Location of the patient: Home . Location of the provider: Office Only the patient (+/- their family members at pt's discretion) and myself were participating in the encounter - This visit type was conducted due to national recommendations for restrictions regarding the COVID-19 Pandemic (e.g. social distancing) in an effort to limit this patient's exposure and mitigate transmission in our community.  This format is felt to be most appropriate for this patient at this time.   - No physical exam could be performed with this format, beyond that communicated to [place] by the patient/ family members as noted.   - Additionally my office staff/ schedulers discussed with the patient that there may be a monetary charge related to this service, depending on their medical insurance.   The patient expressed understanding, and agreed to proceed.       History of Present Illness: Hypertension    I, Toni Amend, am serving as Education administrator for [employer].   Patient notes she's been doing well.  Notes she lost 15 lbs the first time she saw [facility], in March of last year.  Says she was doing well, but after COVID hit, she started eating and drinking all the time again.   - Atrial Fibrillation Notes her afib is stable; she hasn't had any episodes.   - Lifestyle During COVID Notes that she is still cooking a lot more, but that her life hasn't changed too much overall during the pandemic.  States she still goes out to dinner, still gets together with friends; "I don't go into large crowds."  Notes her husband is more of a wild card in terms of wearing a mask, etc.   - Mood Management States her mood is fine.  Says "I have a great life.  I have a wonderful husband.  My  mother's frustrating at times, but she's old, and my life has really been dedicated to her the last year because her health has gone downhill, but that's what we're supposed to do, take care of our parents."    She continues citalopram, but notes she's "not a depressed person," and "the only reason I take citalopram is to deal with stupid people."  Says years ago she was on Effexor and weaned herself off of it, "but then started getting irritated at stupid people again."    HPI:  Hypertension: Notes her problem /main concern today is with her blood pressure.  -  Her blood pressure at home has been running: 141/98, 123/88, 142/92.  Notes her BP this morning was 125/88, and 107/80 yesterday.  -However she is not consistently waiting 10 to 15 minutes prior to checking it and, sometimes may check after drinking a cup of coffee etc.  Notes normally prior to checking her blood pressure, she gets up, pours a cup of coffee, takes her meds, and check sit.  - Patient reports good compliance with medication and/or lifestyle modification  - Her denies acute concerns or problems related to treatment plan or otherwise  Last 3 blood pressure readings in our office are as follows: BP Readings from Last 3 Encounters:  05/07/19 107/80  02/10/19 140/73  12/05/18 (!) 145/101   Filed Weights   05/07/19 0828  Weight: 180 lb (81.6 kg)  HPI:  Hyperlipidemia:  69 y.o. female here for cholesterol follow-up.   Per patient, doesn't want to begin cholesterol medications because she doesn't want to start another med.  Most recent cholesterol panel was:  Lab Results  Component Value Date   CHOL 206 (H) 05/16/2018   HDL 55 05/16/2018   LDLCALC 130 (H) 05/16/2018   TRIG 104 05/16/2018   CHOLHDL 3.7 05/16/2018   Hepatic Function Latest Ref Rng & Units 05/28/2018 05/16/2018 06/03/2015  Total Protein 6.0 - 8.5 g/dL 7.2 6.5 6.6  Albumin 3.8 - 4.8 g/dL 4.7 4.2 3.9  AST 0 - 40 IU/L 22 20 25   ALT 0 - 32  IU/L 10 5 11   Alk Phosphatase 39 - 117 IU/L 70 67 42  Total Bilirubin 0.0 - 1.2 mg/dL 0.6 0.6 0.6  Bilirubin, Direct 0.0 - 0.3 mg/dL - - -      Depression screen [facility] 2/9 05/07/2019 02/10/2019 09/17/2018 05/28/2018 05/15/2018  Decreased Interest 0 0 0 0 0  Down, Depressed, Hopeless 0 0 0 0 0  PHQ - 2 Score 0 0 0 0 0  Altered sleeping 0 0 2 1 0  Tired, decreased energy 0 1 1 1 1   Change in appetite 0 0 0 0 0  Feeling bad or failure about yourself  0 0 0 0 0  Trouble concentrating 0 0 0 0 0  Moving slowly or fidgety/restless 0 0 0 0 0  Suicidal thoughts 0 0 0 0 0  PHQ-9 Score 0 1 3 2 1   Difficult doing work/chores - Not difficult at all Not difficult at all Not difficult at all Not difficult at all      Impression and Recommendations:    1. Paroxysmal atrial fibrillation (HCC)   2. Essential hypertension   3. Hyperlipidemia, unspecified hyperlipidemia type   4. Mood disorder - mixed depression and anxiety   5. Insomnia, unspecified type   6. Primary insomnia      Mood Disorder - Mixed Depression and Anxiety - Stable at this time. - Tolerating citalopram well without S-E. - Continue management as established.  See med list.  - Reviewed the "spokes of the wheel" of mood and health management.  Stressed the importance of ongoing prudent habits, including regular exercise, appropriate sleep hygiene, healthful dietary habits, and prayer/meditation to relax.  - Will continue to monitor.   Primary Insomnia - Stable on one full tablet nightly of trazodone. - Continue management as established.  See med list. - Patient tolerating well without S-E.  - Will continue to monitor.   Essential Hypertension - Blood pressure currently is stable, per pt log sometimes slightly elevated at home. - Patient will continue current treatment regimen.  See med list.  - Education provided to patient today regarding appropriate habits for monitoring blood pressure at home.  Discussed critical  need of sitting quietly prior to checking BP, without stimulation from alcohol, caffeine, exercise, etc., prior.  - Counseled patient on pathophysiology of disease and discussed various treatment options, which always includes dietary and lifestyle modification as first line.   - Lifestyle changes such as dash and heart healthy diets and engaging in a regular exercise program discussed extensively with patient.   - Ambulatory blood pressure monitoring encouraged at least 3 times weekly.  Keep log and bring in every office visit.  Reminded patient that if they ever feel poorly in any way, to check their blood pressure and pulse.  - We will continue to monitor and adjust  treatment plan as needed per BP log.   Paroxysmal Atrial Fibrillation - Stable at this time. - Continue management as established.  See med list.  - Will continue to monitor.   Hyperlipidemia - Last FLP obtained 05/16/2018. - Recommended re-check near future.  - The 10-year ASCVD risk score Mikey Bussing DC Jr., et al., 2013) is: 7.3% - Reviewed critical need to begin statin to help reduce risk of stroke and cardiac event. - Per patient, will consider modification to treatment plan near future after repeat lab work.  - Encouraged prudent dietary changes such as low saturated & trans fat diets for hyperlipidemia and low carb diets for hypertriglyceridemia.  - Encouraged patient to follow AHA guidelines for regular exercise and also engage in weight loss if BMI above 25.   - We will continue to monitor and re-check as discussed.   Lifestyle & Preventative Health Maintenance - Advised patient to continue working toward exercising and prudent weight loss to improve overall mental, physical, and emotional health.    - Encouraged patient to engage in daily physical activity as tolerated, especially a formal exercise routine.  Recommended that the patient eventually strive for at least 150 minutes of moderate cardiovascular  activity per week according to guidelines established by the [facility].   - Healthy dietary habits encouraged, including low-carb, and high amounts of lean protein in diet.   - Patient should also consume adequate amounts of water.  - Health counseling performed.  All questions answered.   Recommendations Last lab work drawn in end of February of 2020. - Return for Medicare Wellness early March with full fasting lab work 3-5 days prior. - Continue to follow up with GYN as established given patient's history.   - As part of my medical decision making, I reviewed the following data within the [employer] History obtained from pt /family, CMA notes reviewed and incorporated if applicable, Labs reviewed, Radiograph/ tests reviewed if applicable and OV notes from prior OV's with me, as well as other specialists she/he has seen since seeing me last, were all reviewed and used in my medical decision making process today.    - Additionally, discussion had with patient regarding our treatment plan, and their biases/concerns about that plan were used in my medical decision making today.    - The patient agreed with the plan and demonstrated an understanding of the instructions.   No barriers to understanding were identified.     Return for Medicare Wellness early March with full fasting lab work 3-5 days prior.    Meds ordered this encounter  Medications  . DISCONTD: diltiazem (TIAZAC) 120 MG 24 hr capsule    Sig: Take 2 capsules (240 mg total) by mouth daily. To goal BP around 130/80    Dispense:  180 capsule    Refill:  0  . diltiazem (TIAZAC) 120 MG 24 hr capsule    Sig: Take 2 capsules (240 mg total) by mouth daily. To goal BP around 130/80    Dispense:  180 capsule    Refill:  0  . traZODone (DESYREL) 100 MG tablet    Sig: 1 tab po q hs prn sleep.    Dispense:  90 tablet    Refill:  1    Medications Discontinued During This Encounter  Medication Reason  . diltiazem  (TIAZAC) 120 MG 24 hr capsule Reorder  . diltiazem (TIAZAC) 120 MG 24 hr capsule Reorder  . traZODone (DESYREL) 100 MG tablet Reorder  I provided 30+ minutes of non face-to-face time during this encounter.  Additional time was spent with charting and coordination of care before and after the actual visit commenced.   Note:  This note was prepared with assistance of Dragon voice recognition software. Occasional wrong-word or sound-a-like substitutions may have occurred due to the inherent limitations of voice recognition software.   This document serves as a record of services personally performed by Savannah Dance, DO. It was created on her behalf by Toni Amend, a trained medical scribe. The creation of this record is based on the scribe's personal observations and the provider's statements to them.   This case required medical decision making of at least moderate complexity. The above documentation from Toni Amend, medical scribe, has been reviewed by Marjory Sneddon, D.O.        Patient Care Team    Relationship Specialty Notifications Start End  Savannah Dance, DO PCP - General Family Medicine  01/29/18   Constance Haw, MD PCP - Cardiology Cardiology Admissions 04/18/17    Comment: Electrophysiologist  Megan Salon, MD Consulting Physician Gynecology  01/29/18   Specialists, Dermatology Consulting Physician Dermatology  01/29/18    Comment: practice where Crista Luria used to work at- goes Gilberto Better, MD Consulting Physician Orthopedic Surgery  01/29/18   Ortho, [facility]  Specialist  05/15/18    Comment: sees for shoulder and hip pain  Juanita Craver, MD Consulting Physician Gastroenterology  12/05/18      -Vitals obtained; medications/ allergies reconciled;  personal medical, social, Sx etc.histories were updated by CMA, reviewed by me and are reflected in chart   Patient Active Problem List   Diagnosis Date Noted  . HLD  (hyperlipidemia)   . Essential hypertension 01/29/2018  . Mood disorder - mixed depression and anxiety 01/29/2018  . Current use of long term anticoagulation 10/06/2016  . Paroxysmal atrial fibrillation ([place]) 10/05/2016  . h/o Cervical cancer 10/01/2011  . H/O total hysterectomy 10/01/2011  . Elevated fasting glucose 12/05/2018  . Insomnia 01/29/2018  . Excessive drinking of alcohol 01/29/2018  . Encounter for long-term (current) use of high-risk medication 01/29/2018  . Mitral valve disease   . LAFB (left anterior fascicular block) 07/01/2013  . Obesity 12/05/2018  . Vitamin D deficiency 12/05/2018  . Small cell carcinoma ([place])- of the cervix 12/05/2018  . Inactivity 01/29/2018  . Postmenopausal 12/05/2018  . Family history of diabetes mellitus in father 12/05/2018  . Insect bite 09/17/2018  . Healthcare maintenance 09/17/2018  . Generalized muscle ache 05/15/2018  . Hip pain, bilateral 05/15/2018  . Chronic pain of both shoulders 05/15/2018  . Attention deficit hyperactivity disorder (ADHD) 01/29/2018  . Health education/counseling 01/29/2018  . PAF (paroxysmal atrial fibrillation) ([place]) 07/01/2013  . Lymphedema 07/01/2013  . Chest pain at rest 10/01/2011  . Leg swelling 10/01/2011     Current Meds  Medication Sig  . ALPRAZolam (XANAX) 0.5 MG tablet Take 1 tablet (0.5 mg total) by mouth daily as needed for anxiety.  . Cholecalciferol (VITAMIN D3) 125 MCG (5000 UT) CAPS Take 1 capsule by mouth daily.  . citalopram (CELEXA) 20 MG tablet TAKE 1 TABLET DAILY  . diltiazem (TIAZAC) 120 MG 24 hr capsule Take 2 capsules (240 mg total) by mouth daily. To goal BP around 130/80  . docusate sodium (COLACE) 100 MG capsule Take 100 mg by mouth daily.  [name] Kitchen ELIQUIS 5 MG TABS tablet TAKE 1 TABLET TWICE A DAY  . furosemide (LASIX) 20 MG  tablet Take 1 tablet (20 mg total) by mouth daily.  . Multiple Vitamin (MULTIVITAMIN WITH MINERALS) TABS Take 1 tablet by mouth daily.  . traZODone (DESYREL)  100 MG tablet 1 tab po q hs prn sleep.  . [DISCONTINUED] diltiazem (TIAZAC) 120 MG 24 hr capsule Take 1 capsule (120 mg total) by mouth daily for 7 days, THEN 2 capsules (240 mg total) daily. To goal BP around 130/80.  . [DISCONTINUED] diltiazem (TIAZAC) 120 MG 24 hr capsule Take 2 capsules (240 mg total) by mouth daily. To goal BP around 130/80  . [DISCONTINUED] traZODone (DESYREL) 100 MG tablet 0.5-1 tab po q hs prn sleep.     Allergies:  No Known Allergies   ROS:  See above HPI for pertinent positives and negatives   Objective:   Blood pressure 107/80, height 5' 3"  (1.6 m), weight 180 lb (81.6 kg), last menstrual period 03/19/1984.  (if some vitals are omitted, this means that patient was UNABLE to obtain them even though they were asked to get them prior to OV today.  They were asked to call us at their earliest convenience with these once obtained. )  General: A & O * 3; sounds in no acute distress; in usual state of health.  Skin: Pt confirms warm and dry extremities and pink fingertips HEENT: Pt confirms lips non-cyanotic Chest: Patient confirms normal chest excursion and movement Respiratory: speaking in full sentences, no conversational dyspnea; patient confirms no use of accessory muscles Psych: insight appears good, mood- appears full

## 2019-06-08 DIAGNOSIS — M25561 Pain in right knee: Secondary | ICD-10-CM | POA: Diagnosis not present

## 2019-06-09 ENCOUNTER — Other Ambulatory Visit: Payer: Self-pay | Admitting: Family Medicine

## 2019-06-09 DIAGNOSIS — E669 Obesity, unspecified: Secondary | ICD-10-CM

## 2019-06-09 DIAGNOSIS — I10 Essential (primary) hypertension: Secondary | ICD-10-CM

## 2019-06-09 DIAGNOSIS — E785 Hyperlipidemia, unspecified: Secondary | ICD-10-CM

## 2019-06-09 DIAGNOSIS — Z0000 Encounter for general adult medical examination without abnormal findings: Secondary | ICD-10-CM

## 2019-06-09 DIAGNOSIS — E559 Vitamin D deficiency, unspecified: Secondary | ICD-10-CM

## 2019-06-09 NOTE — Progress Notes (Signed)
Last lab work drawn in end of February of 2020. - Return for Medicare Wellness early March with full fasting lab work 3-5 days prior. - Continue to follow up with GYN as established given patient's history.

## 2019-06-12 ENCOUNTER — Other Ambulatory Visit: Payer: Self-pay

## 2019-06-12 ENCOUNTER — Other Ambulatory Visit: Payer: Medicare Other

## 2019-06-12 DIAGNOSIS — Z0000 Encounter for general adult medical examination without abnormal findings: Secondary | ICD-10-CM | POA: Diagnosis not present

## 2019-06-12 DIAGNOSIS — R7301 Impaired fasting glucose: Secondary | ICD-10-CM | POA: Diagnosis not present

## 2019-06-12 DIAGNOSIS — E785 Hyperlipidemia, unspecified: Secondary | ICD-10-CM | POA: Diagnosis not present

## 2019-06-12 DIAGNOSIS — E559 Vitamin D deficiency, unspecified: Secondary | ICD-10-CM | POA: Diagnosis not present

## 2019-06-12 DIAGNOSIS — E669 Obesity, unspecified: Secondary | ICD-10-CM

## 2019-06-12 DIAGNOSIS — I10 Essential (primary) hypertension: Secondary | ICD-10-CM

## 2019-06-13 LAB — CBC
Hematocrit: 41.9 % (ref 34.0–46.6)
Hemoglobin: 14.4 g/dL (ref 11.1–15.9)
MCH: 32.8 pg (ref 26.6–33.0)
MCHC: 34.4 g/dL (ref 31.5–35.7)
MCV: 95 fL (ref 79–97)
Platelets: 404 10*3/uL (ref 150–450)
RBC: 4.39 x10E6/uL (ref 3.77–5.28)
RDW: 12.7 % (ref 11.7–15.4)
WBC: 8.4 10*3/uL (ref 3.4–10.8)

## 2019-06-13 LAB — HEMOGLOBIN A1C
Est. average glucose Bld gHb Est-mCnc: 108 mg/dL
Hgb A1c MFr Bld: 5.4 % (ref 4.8–5.6)

## 2019-06-13 LAB — COMPREHENSIVE METABOLIC PANEL
ALT: 7 IU/L (ref 0–32)
AST: 20 IU/L (ref 0–40)
Albumin/Globulin Ratio: 1.9 (ref 1.2–2.2)
Albumin: 4.3 g/dL (ref 3.8–4.8)
Alkaline Phosphatase: 68 IU/L (ref 39–117)
BUN/Creatinine Ratio: 20 (ref 12–28)
BUN: 19 mg/dL (ref 8–27)
Bilirubin Total: 0.3 mg/dL (ref 0.0–1.2)
CO2: 27 mmol/L (ref 20–29)
Calcium: 9.5 mg/dL (ref 8.7–10.3)
Chloride: 100 mmol/L (ref 96–106)
Creatinine, Ser: 0.93 mg/dL (ref 0.57–1.00)
GFR calc Af Amer: 73 mL/min/{1.73_m2} (ref >59)
GFR calc non Af Amer: 63 mL/min/{1.73_m2} (ref >59)
Globulin, Total: 2.3 g/dL (ref 1.5–4.5)
Glucose: 93 mg/dL (ref 65–99)
Potassium: 4.9 mmol/L (ref 3.5–5.2)
Sodium: 139 mmol/L (ref 134–144)
Total Protein: 6.6 g/dL (ref 6.0–8.5)

## 2019-06-13 LAB — LIPID PANEL
Chol/HDL Ratio: 3.2 ratio (ref 0.0–4.4)
Cholesterol, Total: 207 mg/dL — ABNORMAL HIGH (ref 100–199)
HDL: 64 mg/dL (ref >39)
LDL Chol Calc (NIH): 126 mg/dL — ABNORMAL HIGH (ref 0–99)
Triglycerides: 94 mg/dL (ref 0–149)
VLDL Cholesterol Cal: 17 mg/dL (ref 5–40)

## 2019-06-13 LAB — VITAMIN D 25 HYDROXY (VIT D DEFICIENCY, FRACTURES): Vit D, 25-Hydroxy: 88.8 ng/mL (ref 30.0–100.0)

## 2019-06-13 LAB — TSH: TSH: 4.95 u[IU]/mL — ABNORMAL HIGH (ref 0.450–4.500)

## 2019-06-13 LAB — T4, FREE: Free T4: 0.98 ng/dL (ref 0.82–1.77)

## 2019-06-17 ENCOUNTER — Encounter: Payer: Self-pay | Admitting: Family Medicine

## 2019-06-17 ENCOUNTER — Other Ambulatory Visit: Payer: Self-pay

## 2019-06-17 ENCOUNTER — Ambulatory Visit (INDEPENDENT_AMBULATORY_CARE_PROVIDER_SITE_OTHER): Payer: Medicare Other | Admitting: Family Medicine

## 2019-06-17 VITALS — BP 117/78

## 2019-06-17 DIAGNOSIS — Z0000 Encounter for general adult medical examination without abnormal findings: Secondary | ICD-10-CM

## 2019-06-17 DIAGNOSIS — Z23 Encounter for immunization: Secondary | ICD-10-CM | POA: Diagnosis not present

## 2019-06-17 DIAGNOSIS — E785 Hyperlipidemia, unspecified: Secondary | ICD-10-CM | POA: Diagnosis not present

## 2019-06-17 DIAGNOSIS — F439 Reaction to severe stress, unspecified: Secondary | ICD-10-CM | POA: Diagnosis not present

## 2019-06-17 DIAGNOSIS — F1010 Alcohol abuse, uncomplicated: Secondary | ICD-10-CM

## 2019-06-17 MED ORDER — ROSUVASTATIN CALCIUM 5 MG PO TABS: 5.0000 mg | ORAL_TABLET | Freq: Every day | ORAL | 0 refills | Status: DC

## 2019-06-17 MED ORDER — SHINGRIX 50 MCG/0.5ML IM SUSR: 0.5000 mL | Freq: Once | INTRAMUSCULAR | 0 refills | Status: AC

## 2019-06-17 NOTE — Progress Notes (Signed)
Subjective:   Savannah Hill is a 69 y.o. female who presents for Medicare Annual (Subsequent) preventive examination.   HPI: Notes she has been "really struggling with mother." The patient spends a significant portion of the appointment discussing concerns regarding her mother's Paxil prescription, refill, and dosage. Patient reports significant stress and turmoil regarding her mother. States "mother is really struggling with her alcoholism," and the patient is "struggling with how to handle her." The patient just took her mother's car keys away last Friday, and notes that this caused significant stress between the two of them. Regarding female health follow-up, notes she typically goes online to schedule her imaging. Notes that she knows her cholesterol is high, and thought that her appointment today was to discuss this.  Review V reviewed all of her recent labs with her today.  Question were answered about thyroid and cholesterol.  These have been ongoing issues and patient has historically declined going on statin medication. The 10-year ASCVD risk score Mikey Bussing DC Jr., et al., 2013) is: 8.4%      Objective:     Vitals: BP 117/78 Comment: unable to do from home  LMP 03/19/1984   There is no height or weight on file to calculate BMI.  Advanced Directives 10/05/2016 06/07/2016 10/01/2011  Does Patient Have a Medical Advance Directive? No Yes Patient has advance directive, copy not in chart  Type of Advance Directive - [place] in Chart? - No - copy requested -  Would patient like information on creating a medical advance directive? No - Patient declined - -  Pre-existing out of facility DNR order (yellow form or pink MOST form) - - No    Tobacco Social History   Tobacco Use  Smoking Status Never Smoker  Smokeless Tobacco Never Used     Counseling given: Not Answered   Clinical  Intake:                       Past Medical History:  Diagnosis Date  . Anemia    borderline  . Atrial fibrillation ([place])    a. s/p ablation on 10/05/2016  . Cancer [facility])    cervical/rad hysterectomy/bso wiht chemo for small cell ca  . Dyspnea   . Heart valve disorder   . HLD (hyperlipidemia)   . Hypertension   . Joint pain   . Lower extremity edema   . Obesity   . Palpitations    Past Surgical History:  Procedure Laterality Date  . ABLATION OF DYSRHYTHMIC FOCUS  10/05/2016  . ATRIAL FIBRILLATION ABLATION N/A 10/05/2016   Procedure: Atrial Fibrillation Ablation;  Surgeon: Constance Haw, MD;  Location: [facility];  Service: Cardiovascular;  Laterality: N/A;  . IR RADIOLOGY PERIPHERAL GUIDED IV START  09/28/2016  . IR US GUIDE VASC ACCESS RIGHT  09/28/2016  . RADICAL ABDOMINAL HYSTERECTOMY  1986   with BSO  . TEE WITHOUT CARDIOVERSION N/A 06/07/2016   Procedure: TRANSESOPHAGEAL ECHOCARDIOGRAM (TEE);  Surgeon: Sanda Klein, MD;  Location: [facility] ENDOSCOPY;  Service: Cardiovascular;  Laterality: N/A;   Family History  Problem Relation Age of Onset  . Diabetes Mother   . Hypertension Mother   . Lung cancer Mother   . Heart disease Mother   . Thyroid disease Mother   . Depression Mother   . Alcoholism Mother   . Skin cancer Father   . CVA  Father   . Diabetes Father   . Hypertension Father   . Cancer Paternal Grandfather        unknown type   Social History   Socioeconomic History  . Marital status: Married    Spouse name: Kinyetta Baumgarner  . Number of children: 1  . Years of education: Not on file  . Highest education level: Not on file  Occupational History  . 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 Smoking status: Never Smoker  . Smokeless tobacco: Never Used  Substance and Sexual Activity  . Alcohol use: Yes    Alcohol/week: 4.0 - 5.0 standard drinks    Types: 4 - 5 Standard drinks or equivalent per week  .  Drug use: No  . Sexual activity: Yes    Partners: Male    Birth control/protection: Surgical  Other Topics Concern  . Not on file  Social History Narrative   [place]or of [place], [place]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   Married   Social Determinants of Health   Financial Resource Strain:   . Difficulty of Paying Living Expenses:   Food Insecurity:   . Worried About Charity fundraiser in the Last Year:   . Arboriculturist in the Last Year:   Transportation Needs:   . Film/video editor (Medical):   [name] Kitchen Lack of Transportation (Non-Medical):   Physical Activity:   . Days of Exercise per Week:   . Minutes of Exercise per Session:   Stress:   . Feeling of Stress :   Social Connections:   . Frequency of Communication with Friends and Family:   . Frequency of Social Gatherings with Friends and Family:   . Attends Religious Services:   . Active Member of Clubs or Organizations:   . Attends Archivist Meetings:   [name] Kitchen Marital Status:     Outpatient Encounter Medications as of 06/17/2019  Medication Sig  . ALPRAZolam (XANAX) 0.5 MG tablet Take 1 tablet (0.5 mg total) by mouth daily as needed for anxiety.  . Cholecalciferol (VITAMIN D3) 125 MCG (5000 UT) CAPS Take 1 capsule by mouth daily.  . citalopram (CELEXA) 20 MG tablet TAKE 1 TABLET DAILY  . diltiazem (TIAZAC) 120 MG 24 hr capsule Take 2 capsules (240 mg total) by mouth daily. To goal BP around 130/80  . docusate sodium (COLACE) 100 MG capsule Take 100 mg by mouth daily.  [name] Kitchen ELIQUIS 5 MG TABS tablet TAKE 1 TABLET TWICE A DAY  . furosemide (LASIX) 20 MG tablet Take 1 tablet (20 mg total) by mouth daily.  . Multiple Vitamin (MULTIVITAMIN WITH MINERALS) TABS Take 1 tablet by mouth daily.  . traZODone (DESYREL) 100 MG tablet 1 tab po q hs prn sleep.  . rosuvastatin (CRESTOR) 5 MG tablet Take 1 tablet (5 mg total) by mouth at bedtime.  [name] Kitchen Zoster Vaccine Adjuvanted [facility]) injection Inject 0.5 mLs into the  muscle once for 1 dose.   No facility-administered encounter medications on file as of 06/17/2019.    Activities of Daily Living In your present state of health, do you have any difficulty performing the following activities: 06/17/2019 02/10/2019  Hearing? N N  Vision? N N  Difficulty concentrating or making decisions? N N  Walking or climbing stairs? N N  Dressing or bathing? N N  Doing errands, shopping? N N  Some recent data might be hidden    Patient  Care Team: Mellody Dance, DO as PCP - General (Family Medicine) Constance Haw, MD as PCP - Cardiology (Cardiology) Megan Salon, MD as Consulting Physician (Gynecology) [facility] as Consulting Physician (Dermatology) Melrose Nakayama, MD as Consulting Physician (Orthopedic Surgery) Manson Passey, [place] (Specialist) Juanita Craver, MD as Consulting Physician (Gastroenterology)    Assessment:   This is a routine wellness examination for Savannah Hill.  Exercise Activities and Dietary recommendations    Goals   None     Fall Risk Fall Risk  06/17/2019 05/07/2019 03/13/2018 01/29/2018  Falls in the past year? 0 0 0 0  Number falls in past yr: - 0 - -  Injury with Fall? - 0 - -  Follow up Falls evaluation completed Falls evaluation completed Falls evaluation completed -   Is the patient's home free of loose throw rugs in walkways, pet beds, electrical cords, etc?   yes      Grab bars in the bathroom? no      Handrails on the stairs?   yes      Adequate lighting?   yes  Timed Get Up and Go performed: not performed, patient at home for visit  Depression Screen PHQ 2/9 Scores 06/17/2019 05/07/2019 02/10/2019 09/17/2018  PHQ - 2 Score 1 0 0 0  PHQ- 9 Score 2 0 1 3     Cognitive Function     6CIT Screen 06/17/2019  What Year? 0 points  What month? 0 points  What time? 0 points  Count back from 20 0 points  Months in reverse 0 points  Repeat phrase 6 points  Total Score 6    Immunization History   Administered Date(s) Administered  . Influenza, High Dose Seasonal PF 12/12/2017, 01/14/2019  . PFIZER SARS-COV-2 Vaccination 04/11/2019, 05/02/2019  . Pneumococcal Conjugate-13 12/05/2016  . Pneumococcal Polysaccharide-23 10/02/2011, 12/23/2017  . Tdap 01/31/2011    Qualifies for Shingles Vaccine? Yes, sent in script  Screening Tests Health Maintenance  Topic Date Due  . MAMMOGRAM  12/08/2020  . TETANUS/TDAP  01/30/2021  . COLONOSCOPY  11/09/2028  . INFLUENZA VACCINE  Completed  . DEXA SCAN  Completed  . Hepatitis C Screening  Completed  . PNA vac Low Risk Adult  Completed    Cancer Screenings: Lung: Low Dose CT Chest recommended if Age 80-80 years, 30 pack-year currently smoking OR have quit w/in 15years. Patient does not qualify. Breast:  Up to date on Mammogram? Yes   Up to date of Bone Density/Dexa? Yes Colorectal: colonoscopy done 11/10/2018  Additional Screenings: Hepatitis C Screening:  Performed 06/03/2015 negative     Plan:  1:45 PM 06/17/2019 Medicare Wellness PLAN: - Patient continues to follow up with Dr. Hale Bogus for female health. - Last pap smear obtained 09/20/2016, WNL. - Last DEXA obtained 12/09/2018, WNL. Due for repeat 2 years. - Last mammogram obtained 12/09/2018, negative, repeat one year. - Last colonoscopy obtained 11/10/2018 by Dr. Juanita Craver, repeat 7 years. Per patient, there were no polyps found this past screening, but since her grandfather died of colon cancer, she is due for repeat colonoscopy sooner than 10 years. - Need for Shingrix vaccination. See orders. - Per patient, believes her last COVID-19 vaccination was obtained Feb 13. - Extensive counseling provided to patient today regarding caregiver stress. Lengthy converrsation held and all questions answered.    HYPERLIPIDEMIA: -  Discussed that patient's ASCVD risk score is 8.4%. - Strongly advised beginning statin management today. - Patient agrees to begin Crestor today. Advised patient to  drink plenty of water and engage in physical activity to help prevent incidence of S-E on statins. - Handout provided on cholesterol management today. - Re-check liver enzymes and FLP in 6-8 weeks.    Orders Placed This Encounter  Procedures  . ALT    Standing Status:   Future    Standing Expiration Date:   12/17/2019  . Lipid panel    Standing Status:   Future    Standing Expiration Date:   12/17/2019    Order Specific Question:   Has the patient fasted?    Answer:   Yes    Meds ordered this encounter  Medications  . Zoster Vaccine Adjuvanted [facility]) injection    Sig: Inject 0.5 mLs into the muscle once for 1 dose.    Dispense:  0.5 mL    Refill:  0  . rosuvastatin (CRESTOR) 5 MG tablet    Sig: Take 1 tablet (5 mg total) by mouth at bedtime.    Dispense:  90 tablet    Refill:  0    I have personally reviewed and noted the following in the patient's chart:   . Medical and social history . Use of alcohol, tobacco or illicit drugs  . Current medications and supplements . Functional ability and status . Nutritional status . Physical activity . Advanced directives . List of other physicians . Hospitalizations, surgeries, and ER visits in previous 12 months . Vitals . Screenings to include cognitive, depression, and falls . Referrals and appointments  In addition, I have reviewed and discussed with patient certain preventive protocols, quality metrics, and best practice recommendations. A written personalized care plan for preventive services as well as general preventive health recommendations were provided to patient.     Mellody Dance, DO  06/17/2019

## 2019-06-17 NOTE — Patient Instructions (Signed)

## 2019-07-16 NOTE — Progress Notes (Signed)
69 y.o. G1P1 Married White or Caucasian female here for annual exam.  Doing well.  Denies vaginal bleeding.  Mother is 71.  Just took her keys away which created a lot of stress.      Patient's last menstrual period was 03/19/1984.          Sexually active: No.  The current method of family planning is post menopausal status.    Exercising: No.  exercise Smoker:  no  Health Maintenance: Pap:  06-03-15 neg, 09-20-16 neg (hx of cervical cancer) History of abnormal Pap:  yes MMG:  12-09-2018 category b density birads 1:neg Colonoscopy:  11-10-2018 BMD:   12-09-2018 normal, f/u 10yrs TDaP:  2012 Pneumonia vaccine(s):  2019 Shingrix:   2021 Hep C testing: neg 2017 Screening Labs: 06/12/2019   reports that she has never smoked. She has never used smokeless tobacco. She reports current alcohol use of about 4.0 - 5.0 standard drinks of alcohol per week. She reports that she does not use drugs.  Past Medical History:  Diagnosis Date  . Anemia    borderline  . Atrial fibrillation ([place])    a. s/p ablation on 10/05/2016  . Cancer [facility])    cervical/rad hysterectomy/bso wiht chemo for small cell ca  . Dyspnea   . Heart valve disorder   . HLD (hyperlipidemia)   . Hypertension   . Joint pain   . Lower extremity edema   . Obesity   . Palpitations     Past Surgical History:  Procedure Laterality Date  . ABLATION OF DYSRHYTHMIC FOCUS  10/05/2016  . ATRIAL FIBRILLATION ABLATION N/A 10/05/2016   Procedure: Atrial Fibrillation Ablation;  Surgeon: Constance Haw, MD;  Location: [facility];  Service: Cardiovascular;  Laterality: N/A;  . IR RADIOLOGY PERIPHERAL GUIDED IV START  09/28/2016  . IR US GUIDE VASC ACCESS RIGHT  09/28/2016  . RADICAL ABDOMINAL HYSTERECTOMY  1986   with BSO  . TEE WITHOUT CARDIOVERSION N/A 06/07/2016   Procedure: TRANSESOPHAGEAL ECHOCARDIOGRAM (TEE);  Surgeon: Sanda Klein, MD;  Location: [facility] ENDOSCOPY;  Service: Cardiovascular;  Laterality: N/A;    Current  Outpatient Medications  Medication Sig Dispense Refill  . ALPRAZolam (XANAX) 0.5 MG tablet Take 1 tablet (0.5 mg total) by mouth daily as needed for anxiety. 30 tablet 0  . Cholecalciferol (VITAMIN D3) 125 MCG (5000 UT) CAPS Take 1 capsule by mouth daily.    . citalopram (CELEXA) 20 MG tablet TAKE 1 TABLET DAILY 90 tablet 3  . diltiazem (TIAZAC) 120 MG 24 hr capsule Take 2 capsules (240 mg total) by mouth daily. To goal BP around 130/80 180 capsule 0  . docusate sodium (COLACE) 100 MG capsule Take 100 mg by mouth daily.    [name] Kitchen ELIQUIS 5 MG TABS tablet TAKE 1 TABLET TWICE A DAY 180 tablet 1  . furosemide (LASIX) 20 MG tablet Take 1 tablet (20 mg total) by mouth daily. 90 tablet 0  . Multiple Vitamin (MULTIVITAMIN WITH MINERALS) TABS Take 1 tablet by mouth daily.    . rosuvastatin (CRESTOR) 5 MG tablet Take 1 tablet (5 mg total) by mouth at bedtime. 90 tablet 0  . traZODone (DESYREL) 100 MG tablet 1 tab po q hs prn sleep. 90 tablet 1   No current facility-administered medications for this visit.    Family History  Problem Relation Age of Onset  . Diabetes Mother   . Hypertension Mother   . Lung cancer Mother   . Heart disease Mother   .  Thyroid disease Mother   . Depression Mother   . Alcoholism Mother   . Skin cancer Father   . CVA Father   . Diabetes Father   . Hypertension Father   . Cancer Paternal Grandfather        unknown type    Review of Systems  Constitutional: Negative.   HENT: Negative.   Eyes: Negative.   Respiratory: Negative.   Cardiovascular: Negative.   Gastrointestinal: Negative.   Endocrine: Negative.   Genitourinary: Negative.   Musculoskeletal: Negative.   Skin: Negative.   Allergic/Immunologic: Negative.   Neurological: Negative.   Hematological: Negative.   Psychiatric/Behavioral: Negative.     Exam:   BP 110/70   Pulse 68   Temp (!) 97.1 F (36.2 C) (Skin)   Resp 16   Ht 5\' 4"  (1.626 m)   Wt 190 lb (86.2 kg)   LMP 03/19/1984   BMI 32.61  kg/m   Height: 5\' 4"  (162.6 cm)  General appearance: alert, cooperative and appears stated age Head: Normocephalic, without obvious abnormality, atraumatic Neck: no adenopathy, supple, symmetrical, trachea midline and thyroid normal to inspection and palpation Lungs: clear to auscultation bilaterally Breasts: normal appearance, no masses or tenderness Heart: regular rate and rhythm Abdomen: soft, non-tender; bowel sounds normal; no masses,  no organomegaly Extremities: extremities normal, atraumatic, no cyanosis or edema Skin: Skin color, texture, turgor normal. No rashes or lesions Lymph nodes: Cervical, supraclavicular, and axillary nodes normal. No abnormal inguinal nodes palpated Neurologic: Grossly normal   Pelvic: External genitalia:  no lesions              Urethra:  normal appearing urethra with no masses, tenderness or lesions              Bartholins and Skenes: normal                 Vagina: normal appearing vagina with normal color and discharge, no lesions              Cervix: absent              Pap taken: Yes.   Bimanual Exam:  Uterus:  uterus absent              Adnexa: no mass, fullness, tenderness               Rectovaginal: Confirms               Anus:  normal sphincter tone, no lesions  Chaperone, Terence Lux, CMA, was present for exam.  A:  Well Woman with normal exam PMP, off HRT H/o afib, s/p cardiac ablation H/o radical hysterectomy/BSO in 1986 due to small cell carcinoma cervical cancer Mildly elevated lipids  P:   Mammogram guidelines reviewed.  Release of records will be signed. pap smear obtained today RF for Celexa 20mg  daily not needed today.  Will let me know when needs RF. Lab work just done with DR. Opalski Colonoscopy, BMD UTD Has started Shingrix vaccination Return annually or prn

## 2019-07-22 ENCOUNTER — Other Ambulatory Visit: Payer: Self-pay

## 2019-07-23 ENCOUNTER — Encounter: Payer: Self-pay | Admitting: Obstetrics & Gynecology

## 2019-07-23 ENCOUNTER — Other Ambulatory Visit: Payer: Self-pay

## 2019-07-23 ENCOUNTER — Other Ambulatory Visit (HOSPITAL_COMMUNITY)
Admission: RE | Admit: 2019-07-23 | Discharge: 2019-07-23 | Disposition: A | Discharge status: Home / Self Care | Payer: Medicare Other | Source: Ambulatory Visit | Attending: Obstetrics & Gynecology | Admitting: Obstetrics & Gynecology

## 2019-07-23 ENCOUNTER — Ambulatory Visit (INDEPENDENT_AMBULATORY_CARE_PROVIDER_SITE_OTHER): Payer: Medicare Other | Admitting: Obstetrics & Gynecology

## 2019-07-23 VITALS — BP 110/70 | HR 68 | Temp 97.1°F | Resp 16 | Ht 64.0 in | Wt 190.0 lb

## 2019-07-23 DIAGNOSIS — Z124 Encounter for screening for malignant neoplasm of cervix: Secondary | ICD-10-CM | POA: Insufficient documentation

## 2019-07-23 DIAGNOSIS — Z01419 Encounter for gynecological examination (general) (routine) without abnormal findings: Secondary | ICD-10-CM | POA: Diagnosis not present

## 2019-07-23 MED ORDER — CITALOPRAM HYDROBROMIDE 20 MG PO TABS: 20.0000 mg | ORAL_TABLET | Freq: Every day | ORAL | 3 refills | Status: DC

## 2019-07-23 NOTE — Patient Instructions (Signed)
Savannah Hill, [place]: [phone] Behavioral Medicine: [phone] Fax: [phone]  Dr. Eliezer Lofts Dr. Loura Pardon

## 2019-07-27 LAB — CYTOLOGY - PAP: Diagnosis: NEGATIVE

## 2019-08-03 ENCOUNTER — Other Ambulatory Visit: Payer: Self-pay | Admitting: Family Medicine

## 2019-08-03 ENCOUNTER — Telehealth: Payer: Self-pay | Admitting: General Practice

## 2019-08-03 MED ORDER — FUROSEMIDE 20 MG PO TABS: 20.0000 mg | ORAL_TABLET | Freq: Every day | ORAL | 0 refills | Status: DC

## 2019-08-03 NOTE — Telephone Encounter (Signed)
Patient request refill on :   furosemide (LASIX) 20 MG tablet PO:[id number]   Order Details Dose: 20 mg Route: Oral Frequency: Daily  Dispense Quantity: 90 tablet Refills: 0       Sig: Take 1 tablet (20 mg total) by mouth daily.   --Forwarding message to med asst to send refill order to :   [facility] [place] to [facility] [phone] (Phone) [phone] (Fax   --Please contact pt if there are any questions / concerns [phone].  --glh

## 2019-08-03 NOTE — Telephone Encounter (Signed)
Refill has been responded to. Please see other note. AS, CMA

## 2019-08-22 ENCOUNTER — Other Ambulatory Visit: Payer: Self-pay | Admitting: Obstetrics & Gynecology

## 2019-08-31 ENCOUNTER — Other Ambulatory Visit: Payer: Self-pay

## 2019-08-31 NOTE — Telephone Encounter (Signed)
Tried calling patient regarding refill. Citalopram sent to pharmacy 07/23/19 #90 w/3 refills. No answer, message left for patient to call me back.

## 2019-08-31 NOTE — Telephone Encounter (Signed)
Patient is calling for a refill of Citalopram.

## 2019-09-01 ENCOUNTER — Other Ambulatory Visit: Payer: Self-pay

## 2019-09-01 MED ORDER — CITALOPRAM HYDROBROMIDE 20 MG PO TABS: 20.0000 mg | ORAL_TABLET | Freq: Every day | ORAL | 3 refills | Status: DC

## 2019-09-01 NOTE — Telephone Encounter (Signed)
Opened in error

## 2019-09-01 NOTE — Telephone Encounter (Signed)
Spoke with patient and prescription should be sent to [facility] instead of [facility]. Prescription has been sent to the right pharmacy. Patient notified via voicemail. Okay per DPR to leave a detailed message at both mobile and home numbers. Okay to close encounter.

## 2019-09-07 ENCOUNTER — Other Ambulatory Visit: Payer: Self-pay | Admitting: Physician Assistant

## 2019-09-14 ENCOUNTER — Other Ambulatory Visit: Payer: Self-pay | Admitting: Physician Assistant

## 2019-10-12 ENCOUNTER — Other Ambulatory Visit: Payer: Self-pay | Admitting: Family Medicine

## 2019-10-12 DIAGNOSIS — I480 Paroxysmal atrial fibrillation: Secondary | ICD-10-CM

## 2019-10-12 DIAGNOSIS — I10 Essential (primary) hypertension: Secondary | ICD-10-CM

## 2019-10-19 ENCOUNTER — Other Ambulatory Visit: Payer: Self-pay | Admitting: Physician Assistant

## 2019-10-19 DIAGNOSIS — I10 Essential (primary) hypertension: Secondary | ICD-10-CM

## 2019-10-19 DIAGNOSIS — I480 Paroxysmal atrial fibrillation: Secondary | ICD-10-CM

## 2019-10-28 ENCOUNTER — Other Ambulatory Visit: Payer: Self-pay | Admitting: Cardiology

## 2019-10-28 DIAGNOSIS — I480 Paroxysmal atrial fibrillation: Secondary | ICD-10-CM

## 2019-10-28 NOTE — Telephone Encounter (Signed)
Prescription refill request for Eliquis received.  Last office visit: Savannah Hill 10/09/2018 Scr: 0.93, 06/12/2019 Age: 69 y.o. Weight: 86.2 kg   Pt overdue for an office visit.

## 2019-10-28 NOTE — Telephone Encounter (Signed)
Do not see where appointment was scheduled.  Spoke with Ep scheduler, [place], who stated she called pt and LMOM for her to call her back and schedule an appointment.

## 2019-10-28 NOTE — Telephone Encounter (Signed)
It has been over a year since pt has seen a provider. Called and spoke to pt. Made her aware that to keep refilling medication pt will need to schedule an appointment to see cardiologist. Transferred pt to the mainline to make appointment.

## 2019-10-29 NOTE — Telephone Encounter (Addendum)
Spoke with pt and she stated she was transferred to a message line and it stated she would get a call back and she hasn't until now. She stated she is willing to have a mid morning appt around 10-12n on any given day of the week so the scheduler could set her one and send it through to [place]. Also, she states she is not out of Eliquis at this time. She was advised that we really like to confirm a time when setting appts and I would message the scheduler to try her back as well on tomorrow.  Send a message to [place]-scheduler.

## 2019-10-30 NOTE — Telephone Encounter (Signed)
Pt has appointment scheduled to see Dr. Ileana Ladd 8/17

## 2019-11-03 ENCOUNTER — Other Ambulatory Visit: Payer: Self-pay

## 2019-11-03 ENCOUNTER — Ambulatory Visit (INDEPENDENT_AMBULATORY_CARE_PROVIDER_SITE_OTHER): Payer: Medicare Other | Admitting: Cardiology

## 2019-11-03 ENCOUNTER — Encounter: Payer: Self-pay | Admitting: Cardiology

## 2019-11-03 VITALS — BP 118/84 | HR 66 | Ht 64.0 in | Wt 193.2 lb

## 2019-11-03 DIAGNOSIS — I480 Paroxysmal atrial fibrillation: Secondary | ICD-10-CM

## 2019-11-03 MED ORDER — APIXABAN 5 MG PO TABS: 5.0000 mg | ORAL_TABLET | Freq: Two times a day (BID) | ORAL | 2 refills | Status: DC

## 2019-11-03 NOTE — Progress Notes (Signed)
Electrophysiology Office Note   Date:  11/03/2019   ID:  Savannah Hill, DOB Sep 17, 1950, MRN [id number]  PCP:  Savannah Reid, PA-C  Cardiologist:  Savannah Hill Primary Electrophysiologist:  Savannah Haw, MD    No chief complaint on file.    History of Present Illness: Savannah Hill is a 69 y.o. female who presents today for electrophysiology evaluation.   She has a history of atrial fibrillation on Eliquis, hypertension, and PVCs.  Her atrial fibrillation was found after hospitalization in 2013.  She is now status post AF ablation 10/05/2016.    Today, denies symptoms of palpitations, chest pain, shortness of breath, orthopnea, PND, lower extremity edema, claudication, dizziness, presyncope, syncope, bleeding, or neurologic sequela. The patient is tolerating medications without difficulties.  Overall she is doing well.  She has no chest pain or shortness of breath.  She has had no further episodes of atrial fibrillation.  She is able to do all of her daily activities.  She does say that she has gained some weight since Covid started, but she is trying to exercise more.  Past Medical History:  Diagnosis Date   Anemia    borderline   Atrial fibrillation ([place])    a. s/p ablation on 10/05/2016   Cancer [facility])    cervical/rad hysterectomy/bso wiht chemo for small cell ca   Dyspnea    Heart valve disorder    HLD (hyperlipidemia)    Hypertension    Joint pain    Lower extremity edema    Obesity    Palpitations    Past Surgical History:  Procedure Laterality Date   ABLATION OF DYSRHYTHMIC FOCUS  10/05/2016   ATRIAL FIBRILLATION ABLATION N/A 10/05/2016   Procedure: Atrial Fibrillation Ablation;  Surgeon: Savannah Haw, MD;  Location: [facility];  Service: Cardiovascular;  Laterality: N/A;   IR RADIOLOGY PERIPHERAL GUIDED IV START  09/28/2016   IR US GUIDE VASC ACCESS RIGHT  09/28/2016   RADICAL ABDOMINAL HYSTERECTOMY  1986   with BSO   TEE  WITHOUT CARDIOVERSION N/A 06/07/2016   Procedure: TRANSESOPHAGEAL ECHOCARDIOGRAM (TEE);  Surgeon: Savannah Klein, MD;  Location: [facility] ENDOSCOPY;  Service: Cardiovascular;  Laterality: N/A;     Current Outpatient Medications  Medication Sig Dispense Refill   Cholecalciferol (VITAMIN D3) 125 MCG (5000 UT) CAPS Take 1 capsule by mouth daily.     citalopram (CELEXA) 20 MG tablet Take 1 tablet (20 mg total) by mouth daily. 90 tablet 3   diltiazem (TIAZAC) 120 MG 24 hr capsule TAKE 2 CAPSULES (240 MG TOTAL) BY MOUTH DAILY. TO GOAL BLOOD PRESSURE AROUND 130/80 120 capsule 0   docusate sodium (COLACE) 100 MG capsule Take 100 mg by mouth daily.     ELIQUIS 5 MG TABS tablet TAKE 1 TABLET TWICE A DAY 180 tablet 1   furosemide (LASIX) 20 MG tablet Take 1 tablet (20 mg total) by mouth daily. **PATIENT NEEDS APT FOR FURTHER REFILLS** 60 tablet 0   Multiple Vitamin (MULTIVITAMIN WITH MINERALS) TABS Take 1 tablet by mouth daily.     rosuvastatin (CRESTOR) 5 MG tablet Take 1 tablet (5 mg total) by mouth at bedtime. **PATIENT NEEDS APT FOR FURTHER REFILLS** 60 tablet 0   traZODone (DESYREL) 100 MG tablet 1 tab po q hs prn sleep. 90 tablet 1   No current facility-administered medications for this visit.    Allergies:   Patient has no known allergies.   Social History:  The patient  reports that she  has never smoked. She has never used smokeless tobacco. She reports current alcohol use of about 4.0 - 5.0 standard drinks of alcohol per week. She reports that she does not use drugs.   Family History:  The patient's family history includes Alcoholism in her mother; CVA in her father; Cancer in her paternal grandfather; Depression in her mother; Diabetes in her father and mother; Heart disease in her mother; Hypertension in her father and mother; Lung cancer in her mother; Skin cancer in her father; Thyroid disease in her mother.   ROS:  Please see the history of present illness.   Otherwise, review of  systems is positive for none.   All other systems are reviewed and negative.   PHYSICAL EXAM: VS:  BP 118/84    Pulse 66    Ht 5\' 4"  (1.626 m)    Wt 193 lb 3.2 oz (87.6 kg)    LMP 03/19/1984    SpO2 96%    BMI 33.16 kg/m  , BMI Body mass index is 33.16 kg/m. GEN: Well nourished, well developed, in no acute distress  HEENT: normal  Neck: no JVD, carotid bruits, or masses Cardiac: RRR; no murmurs, rubs, or gallops,no edema  Respiratory:  clear to auscultation bilaterally, normal work of breathing GI: soft, nontender, nondistended, + BS MS: no deformity or atrophy  Skin: warm and dry Neuro:  Strength and sensation are intact Psych: euthymic mood, full affect  EKG:  EKG is ordered today. Personal review of the ekg ordered shows sinus rhythm, rate 66  Recent Labs: 06/12/2019: ALT 7; BUN 19; Creatinine, Ser 0.93; Hemoglobin 14.4; Platelets 404; Potassium 4.9; Sodium 139; TSH 4.950    Lipid Panel     Component Value Date/Time   CHOL 207 (H) 06/12/2019 0903   TRIG 94 06/12/2019 0903   HDL 64 06/12/2019 0903   CHOLHDL 3.2 06/12/2019 0903   CHOLHDL 4.8 06/03/2015 1403   VLDL 21 06/03/2015 1403   LDLCALC 126 (H) 06/12/2019 0903     Wt Readings from Last 3 Encounters:  11/03/19 193 lb 3.2 oz (87.6 kg)  07/23/19 190 lb (86.2 kg)  05/07/19 180 lb (81.6 kg)      Other studies Reviewed: Additional studies/ records that were reviewed today include: TTE3/22/18 Review of the above records today demonstrates:   - Left ventricle: Systolic function was normal. The estimated   ejection fraction was in the range of 55% to 60%. Wall motion was   normal; there were no regional wall motion abnormalities. There   was a reduced contribution of atrial contraction to ventricular   filling, due to increased ventricular diastolic pressure or   atrial contractile dysfunction. Features are consistent with a   pseudonormal left ventricular filling pattern, with concomitant   abnormal relaxation and  increased filling pressure (grade 2   diastolic dysfunction). - Aortic valve: No evidence of vegetation. - Mitral valve: Moderately dilated annulus. Structurally normal   valve. There was moderate regurgitation directed centrally. - Left atrium: The atrium was mildly to moderately dilated. No   evidence of thrombus in the atrial cavity or appendage. - Right atrium: No evidence of thrombus in the atrial cavity or   appendage. No evidence of thrombus in the atrial cavity or   appendage. - Atrial septum: No defect or patent foramen ovale was identified.   There was no right-to-left atrial level shunt, following an   increase in RA pressure induced by provocative maneuvers. - Pulmonic valve: No evidence of vegetation. -  Pulmonary arteries: Systolic pressure was mildly increased. PA   peak pressure: 40 mm Hg (S). - Pericardium, extracardiac: A trivial pericardial effusion was   identified.  SPECT 06/15/16  Nuclear stress EF: 58%.  There was no ST segment deviation noted during stress.  The study is normal.  This is a low risk study.  The left ventricular ejection fraction is hyperdynamic (>65%).   Normal pharmacologic nuclear stress test with no evidence of prior infarct or ischemia.   ASSESSMENT AND PLAN:  1.  Paroxysmal atrial fibrillation/flutter: Status post ablation 10/05/2016.  Currently on Eliquis.  CHA2DS2-VASc of 3.  Fortunately she remains in sinus rhythm.  We Kimothy Kishimoto continue her Eliquis.  2.  PVCs: Minimally symptomatic.  No changes.  3. Hypertension: Currently well controlled  4. Moderate mitral regurgitation: No shortness of breath at this time.  Continue with current management.  We Joshuajames Moehring get an echo prior to her next appointment.   Current medicines are reviewed at length with the patient today.   The patient does not have concerns regarding her medicines.  The following changes were made today: None  Labs/ tests ordered today include:  Orders Placed This  Encounter  Procedures   EKG 12-Lead     Disposition:   FU with Rashay Barnette 12 months  Signed, Mavi Un Meredith Leeds, MD  11/03/2019 3:54 PM     [facility] [address] [place] [address] [phone] (office) [phone] (fax)

## 2019-11-03 NOTE — Addendum Note (Signed)
Addended by: Stanton Kidney on: 11/03/2019 05:23 PM   Modules accepted: Orders

## 2019-11-11 ENCOUNTER — Telehealth: Payer: Self-pay | Admitting: Physician Assistant

## 2019-11-11 DIAGNOSIS — I480 Paroxysmal atrial fibrillation: Secondary | ICD-10-CM

## 2019-11-11 DIAGNOSIS — I10 Essential (primary) hypertension: Secondary | ICD-10-CM

## 2019-11-11 MED ORDER — FUROSEMIDE 20 MG PO TABS: 20.0000 mg | ORAL_TABLET | Freq: Every day | ORAL | 0 refills | Status: DC

## 2019-11-11 MED ORDER — DILTIAZEM HCL ER BEADS 120 MG PO CP24: ORAL_CAPSULE | ORAL | 0 refills | Status: DC

## 2019-11-11 MED ORDER — ROSUVASTATIN CALCIUM 5 MG PO TABS: 5.0000 mg | ORAL_TABLET | Freq: Every day | ORAL | 0 refills | Status: DC

## 2019-11-11 NOTE — Telephone Encounter (Signed)
Patient understands she is overdue for a f/u for refills. She has scheduled one of our next available appts but it is not until 12/21/19. She is requesting refills of her diltiazem,furosemide, and Crestor. If approved please send to [facility]

## 2019-11-11 NOTE — Addendum Note (Signed)
Addended by: Mickel Crow on: 11/11/2019 09:51 AM   Modules accepted: Orders

## 2019-11-11 NOTE — Telephone Encounter (Signed)
Refill sent to requested pharmacy. AS, CMA 

## 2019-12-14 DIAGNOSIS — M25561 Pain in right knee: Secondary | ICD-10-CM | POA: Diagnosis not present

## 2019-12-16 DIAGNOSIS — Z1231 Encounter for screening mammogram for malignant neoplasm of breast: Secondary | ICD-10-CM | POA: Diagnosis not present

## 2019-12-16 LAB — HM MAMMOGRAPHY

## 2019-12-21 ENCOUNTER — Ambulatory Visit (INDEPENDENT_AMBULATORY_CARE_PROVIDER_SITE_OTHER): Payer: Medicare Other | Admitting: Physician Assistant

## 2019-12-21 ENCOUNTER — Encounter: Payer: Self-pay | Admitting: Physician Assistant

## 2019-12-21 ENCOUNTER — Other Ambulatory Visit: Payer: Self-pay

## 2019-12-21 VITALS — BP 114/74 | HR 70 | Ht 64.0 in | Wt 193.9 lb

## 2019-12-21 DIAGNOSIS — E785 Hyperlipidemia, unspecified: Secondary | ICD-10-CM

## 2019-12-21 DIAGNOSIS — I890 Lymphedema, not elsewhere classified: Secondary | ICD-10-CM | POA: Diagnosis not present

## 2019-12-21 DIAGNOSIS — C531 Malignant neoplasm of exocervix: Secondary | ICD-10-CM

## 2019-12-21 DIAGNOSIS — I10 Essential (primary) hypertension: Secondary | ICD-10-CM

## 2019-12-21 DIAGNOSIS — I480 Paroxysmal atrial fibrillation: Secondary | ICD-10-CM | POA: Diagnosis not present

## 2019-12-21 MED ORDER — ROSUVASTATIN CALCIUM 5 MG PO TABS: 5.0000 mg | ORAL_TABLET | Freq: Every day | ORAL | 1 refills | Status: DC

## 2019-12-21 MED ORDER — DILTIAZEM HCL ER BEADS 120 MG PO CP24: ORAL_CAPSULE | ORAL | 1 refills | Status: DC

## 2019-12-21 MED ORDER — FUROSEMIDE 20 MG PO TABS: 20.0000 mg | ORAL_TABLET | Freq: Every day | ORAL | 1 refills | Status: DC

## 2019-12-21 NOTE — Assessment & Plan Note (Signed)
-  Followed by Cardiology. -Continue current medication regimen.

## 2019-12-21 NOTE — Assessment & Plan Note (Signed)
-  Patient was started on Crestor last OV and tolerating well. -Advised to schedule lab visit for FBW to recheck lipid panel and hepatic function. -Follow a heart healthy diet. -Will continue to monitor.

## 2019-12-21 NOTE — Progress Notes (Signed)
Established Patient Office Visit  Subjective:  Patient ID: Savannah Hill, female    DOB: Mar 28, 1950  Age: 69 y.o. MRN: [id number]  CC:  Chief Complaint  Patient presents with   Hypertension   Hyperlipidemia    HPI Savannah Hill presents for follow up on hyperlipidemia and hypertension. Pt reports she is doing well and has no acute concerns. Needs medication refills.  HTN: Pt denies chest pain, palpitations, or dizziness. Has chronic lymphedema of right lower extremity. She takes Lasix to help with swelling. Taking medication as directed without side effects. Doesn't check BP at home but has not felt like her blood pressure has been elevated.  HLD: At last OV patient was started on Crestor. Pt taking medication as directed without issues. Denies side effects.   Past Medical History:  Diagnosis Date   Anemia    borderline   Atrial fibrillation ([place])    a. s/p ablation on 10/05/2016   Cancer [facility])    cervical/rad hysterectomy/bso wiht chemo for small cell ca   Dyspnea    Heart valve disorder    HLD (hyperlipidemia)    Hypertension    Joint pain    Lower extremity edema    Obesity    Palpitations     Past Surgical History:  Procedure Laterality Date   ABLATION OF DYSRHYTHMIC FOCUS  10/05/2016   ATRIAL FIBRILLATION ABLATION N/A 10/05/2016   Procedure: Atrial Fibrillation Ablation;  Surgeon: Constance Haw, MD;  Location: [facility];  Service: Cardiovascular;  Laterality: N/A;   IR RADIOLOGY PERIPHERAL GUIDED IV START  09/28/2016   IR US GUIDE VASC ACCESS RIGHT  09/28/2016   RADICAL ABDOMINAL HYSTERECTOMY  1986   with BSO   TEE WITHOUT CARDIOVERSION N/A 06/07/2016   Procedure: TRANSESOPHAGEAL ECHOCARDIOGRAM (TEE);  Surgeon: Sanda Klein, MD;  Location: [facility] ENDOSCOPY;  Service: Cardiovascular;  Laterality: N/A;    Family History  Problem Relation Age of Onset   Diabetes Mother    Hypertension Mother    Lung cancer Mother     Heart disease Mother    Thyroid disease Mother    Depression Mother    Alcoholism Mother    Skin cancer Father    CVA Father    Diabetes Father    Hypertension Father    Cancer Paternal Grandfather        unknown type    Social History   Socioeconomic History   Marital status: Married    Spouse name: Milley Vining   Number of children: 1   Years of education: Not on file   Highest education level: Not on file  Occupational History   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, [employer]  Tobacco Use   Smoking status: Never Smoker   Smokeless tobacco: Never Used  Vaping Use   Vaping Use: Never used  Substance and Sexual Activity   Alcohol use: Yes    Alcohol/week: 4.0 - 5.0 standard drinks    Types: 4 - 5 Standard drinks or equivalent per week   Drug use: No   Sexual activity: Yes    Partners: Male    Birth control/protection: Surgical  Other Topics Concern   Not on file  Social History Narrative   [place]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   Married   Social Determinants of Health   Financial Resource Strain:    Difficulty of Paying Living Expenses: Not on file  Food Insecurity:  Worried About Charity fundraiser in the Last Year: Not on file   [employer] of Food in the Last Year: Not on file  Transportation Needs:    Lack of Transportation (Medical): Not on file   Lack of Transportation (Non-Medical): Not on file  Physical Activity:    Days of Exercise per Week: Not on file   Minutes of Exercise per Session: Not on file  Stress:    Feeling of Stress : Not on file  Social Connections:    Frequency of Communication with Friends and Family: Not on file   Frequency of Social Gatherings with Friends and Family: Not on file   Attends Religious Services: Not on file   Active Member of Clubs or Organizations: Not on file   Attends Archivist Meetings: Not on file   Marital Status: Not on  file  Intimate Partner Violence:    Fear of Current or Ex-Partner: Not on file   Emotionally Abused: Not on file   Physically Abused: Not on file   Sexually Abused: Not on file    Outpatient Medications Prior to Visit  Medication Sig Dispense Refill   apixaban (ELIQUIS) 5 MG TABS tablet Take 1 tablet (5 mg total) by mouth 2 (two) times daily. 180 tablet 2   Cholecalciferol (VITAMIN D3) 125 MCG (5000 UT) CAPS Take 1 capsule by mouth daily.     citalopram (CELEXA) 20 MG tablet Take 1 tablet (20 mg total) by mouth daily. 90 tablet 3   docusate sodium (COLACE) 100 MG capsule Take 100 mg by mouth daily.     Multiple Vitamin (MULTIVITAMIN WITH MINERALS) TABS Take 1 tablet by mouth daily.     traZODone (DESYREL) 100 MG tablet 1 tab po q hs prn sleep. 90 tablet 1   diltiazem (TIAZAC) 120 MG 24 hr capsule TAKE 2 CAPSULES (240 MG TOTAL) BY MOUTH DAILY. TO GOAL BLOOD PRESSURE AROUND 130/80**PT NEEDS APT FOR FURTHER REFILLS** 120 capsule 0   furosemide (LASIX) 20 MG tablet Take 1 tablet (20 mg total) by mouth daily. **PATIENT NEEDS APT FOR FURTHER REFILLS** 60 tablet 0   rosuvastatin (CRESTOR) 5 MG tablet Take 1 tablet (5 mg total) by mouth at bedtime. **PATIENT NEEDS APT FOR FURTHER REFILLS** 60 tablet 0   No facility-administered medications prior to visit.    No Known Allergies  ROS Review of Systems  A fourteen system review of systems was performed and found to be positive as per HPI.   Objective:    Physical Exam General: Well nourished, in no apparent distress. Eyes: PERRLA, EOMs, conjunctiva clr Resp: Respiratory effort- normal, ECTA B/L w/o W/R/R  Cardio: RRR w/o MRGs. Abdomen: no gross distention. Lymphatics:  Edema of R lower extremity  M-sk: Full ROM, 5/5 strength, normal gait.  Skin: Warm, dry  Neuro: Alert, Oriented Psych: Normal affect, Insight and Judgment appropriate.   BP 114/74    Pulse 70    Ht 5\' 4"  (1.626 m)    Wt 193 lb 14.4 oz (88 kg)    LMP  03/19/1984    SpO2 98%    BMI 33.28 kg/m  Wt Readings from Last 3 Encounters:  12/21/19 193 lb 14.4 oz (88 kg)  11/03/19 193 lb 3.2 oz (87.6 kg)  07/23/19 190 lb (86.2 kg)     Health Maintenance Due  Topic Date Due   INFLUENZA VACCINE  10/18/2019    There are no preventive care reminders to display for this patient.  Lab  Results  Component Value Date   TSH 4.950 (H) 06/12/2019   Lab Results  Component Value Date   WBC 8.4 06/12/2019   HGB 14.4 06/12/2019   HCT 41.9 06/12/2019   MCV 95 06/12/2019   PLT 404 06/12/2019   Lab Results  Component Value Date   NA 139 06/12/2019   K 4.9 06/12/2019   CO2 27 06/12/2019   GLUCOSE 93 06/12/2019   BUN 19 06/12/2019   CREATININE 0.93 06/12/2019   BILITOT 0.3 06/12/2019   ALKPHOS 68 06/12/2019   AST 20 06/12/2019   ALT 7 06/12/2019   PROT 6.6 06/12/2019   ALBUMIN 4.3 06/12/2019   CALCIUM 9.5 06/12/2019   GFR 51.14 (L) 02/22/2014   Lab Results  Component Value Date   CHOL 207 (H) 06/12/2019   Lab Results  Component Value Date   HDL 64 06/12/2019   Lab Results  Component Value Date   LDLCALC 126 (H) 06/12/2019   Lab Results  Component Value Date   TRIG 94 06/12/2019   Lab Results  Component Value Date   CHOLHDL 3.2 06/12/2019   Lab Results  Component Value Date   HGBA1C 5.4 06/12/2019      Assessment & Plan:   Problem List Items Addressed This Visit      Cardiovascular and Mediastinum   Essential hypertension (Chronic)    -BP at goal -Continue current medication regimen. -Follow a low sodium diet. -Will continue to monitor. -Advised patient to schedule FBW including CMP for medication monitoring.       Relevant Medications   diltiazem (TIAZAC) 120 MG 24 hr capsule   furosemide (LASIX) 20 MG tablet   rosuvastatin (CRESTOR) 5 MG tablet   Paroxysmal atrial fibrillation ([facility])    -Followed by Cardiology. -Continue current medication regimen.      Relevant Medications   diltiazem (TIAZAC) 120 MG  24 hr capsule   furosemide (LASIX) 20 MG tablet   rosuvastatin (CRESTOR) 5 MG tablet     Genitourinary   h/o Cervical cancer (Chronic)    -Followed by Ob-Gyn. -Most recent pap smear negative for malignancy.        Other   Lymphedema    -Stable -Continue current medication regimen.       Relevant Medications   furosemide (LASIX) 20 MG tablet   Hyperlipidemia - Primary    -Patient was started on Crestor last OV and tolerating well. -Advised to schedule lab visit for FBW to recheck lipid panel and hepatic function. -Follow a heart healthy diet. -Will continue to monitor.      Relevant Medications   diltiazem (TIAZAC) 120 MG 24 hr capsule   furosemide (LASIX) 20 MG tablet   rosuvastatin (CRESTOR) 5 MG tablet      Meds ordered this encounter  Medications   diltiazem (TIAZAC) 120 MG 24 hr capsule    Sig: TAKE 2 CAPSULES (240 MG TOTAL) BY MOUTH DAILY. TO GOAL BLOOD PRESSURE AROUND 130/80    Dispense:  180 capsule    Refill:  1    Order Specific Question:   Supervising Provider    Answer:   Beatrice Lecher D [[id number]]   furosemide (LASIX) 20 MG tablet    Sig: Take 1 tablet (20 mg total) by mouth daily.    Dispense:  90 tablet    Refill:  1    Order Specific Question:   Supervising Provider    Answer:   Beatrice Lecher D [[id number]]   rosuvastatin (CRESTOR) 5 MG tablet  Sig: Take 1 tablet (5 mg total) by mouth at bedtime.    Dispense:  90 tablet    Refill:  1    Order Specific Question:   Supervising Provider    Answer:   Beatrice Lecher D [[id number]]    Follow-up: Return for MCW in 5 months.   Note:  This note was prepared with assistance of Dragon voice recognition software. Occasional wrong-word or sound-a-like substitutions may have occurred due to the inherent limitations of voice recognition software.  Lorrene Reid, PA-C

## 2019-12-21 NOTE — Assessment & Plan Note (Signed)
-  BP at goal -Continue current medication regimen. -Follow a low sodium diet. -Will continue to monitor. -Advised patient to schedule FBW including CMP for medication monitoring.

## 2019-12-21 NOTE — Assessment & Plan Note (Signed)
-  Stable. Continue current medication regimen. 

## 2019-12-21 NOTE — Assessment & Plan Note (Addendum)
-  Followed by Ob-Gyn. -Most recent pap smear negative for malignancy.

## 2019-12-23 DIAGNOSIS — L570 Actinic keratosis: Secondary | ICD-10-CM | POA: Diagnosis not present

## 2019-12-23 DIAGNOSIS — L814 Other melanin hyperpigmentation: Secondary | ICD-10-CM | POA: Diagnosis not present

## 2019-12-23 DIAGNOSIS — Z85828 Personal history of other malignant neoplasm of skin: Secondary | ICD-10-CM | POA: Diagnosis not present

## 2019-12-23 DIAGNOSIS — L821 Other seborrheic keratosis: Secondary | ICD-10-CM | POA: Diagnosis not present

## 2019-12-23 DIAGNOSIS — L578 Other skin changes due to chronic exposure to nonionizing radiation: Secondary | ICD-10-CM | POA: Diagnosis not present

## 2019-12-23 DIAGNOSIS — D225 Melanocytic nevi of trunk: Secondary | ICD-10-CM | POA: Diagnosis not present

## 2019-12-23 DIAGNOSIS — I890 Lymphedema, not elsewhere classified: Secondary | ICD-10-CM | POA: Diagnosis not present

## 2019-12-29 ENCOUNTER — Encounter: Payer: Self-pay | Admitting: Obstetrics & Gynecology

## 2019-12-30 ENCOUNTER — Encounter: Payer: Self-pay | Admitting: Physician Assistant

## 2020-01-04 DIAGNOSIS — Z23 Encounter for immunization: Secondary | ICD-10-CM | POA: Diagnosis not present

## 2020-01-19 ENCOUNTER — Other Ambulatory Visit: Payer: Self-pay | Admitting: Physician Assistant

## 2020-01-19 DIAGNOSIS — I10 Essential (primary) hypertension: Secondary | ICD-10-CM

## 2020-01-19 DIAGNOSIS — E785 Hyperlipidemia, unspecified: Secondary | ICD-10-CM

## 2020-01-21 ENCOUNTER — Other Ambulatory Visit: Payer: Self-pay

## 2020-01-21 ENCOUNTER — Other Ambulatory Visit: Payer: Medicare Other

## 2020-01-21 DIAGNOSIS — E785 Hyperlipidemia, unspecified: Secondary | ICD-10-CM

## 2020-01-21 DIAGNOSIS — I10 Essential (primary) hypertension: Secondary | ICD-10-CM | POA: Diagnosis not present

## 2020-01-22 LAB — COMPREHENSIVE METABOLIC PANEL
ALT: 11 IU/L (ref 0–32)
AST: 22 IU/L (ref 0–40)
Albumin/Globulin Ratio: 1.7 (ref 1.2–2.2)
Albumin: 4.5 g/dL (ref 3.8–4.8)
Alkaline Phosphatase: 84 IU/L (ref 44–121)
BUN/Creatinine Ratio: 13 (ref 12–28)
BUN: 12 mg/dL (ref 8–27)
Bilirubin Total: 0.7 mg/dL (ref 0.0–1.2)
CO2: 24 mmol/L (ref 20–29)
Calcium: 9.5 mg/dL (ref 8.7–10.3)
Chloride: 103 mmol/L (ref 96–106)
Creatinine, Ser: 0.95 mg/dL (ref 0.57–1.00)
GFR calc Af Amer: 71 mL/min/{1.73_m2} (ref >59)
GFR calc non Af Amer: 61 mL/min/{1.73_m2} (ref >59)
Globulin, Total: 2.7 g/dL (ref 1.5–4.5)
Glucose: 84 mg/dL (ref 65–99)
Potassium: 4.6 mmol/L (ref 3.5–5.2)
Sodium: 142 mmol/L (ref 134–144)
Total Protein: 7.2 g/dL (ref 6.0–8.5)

## 2020-01-22 LAB — LIPID PANEL
Chol/HDL Ratio: 2.4 ratio (ref 0.0–4.4)
Cholesterol, Total: 179 mg/dL (ref 100–199)
HDL: 74 mg/dL (ref >39)
LDL Chol Calc (NIH): 88 mg/dL (ref 0–99)
Triglycerides: 94 mg/dL (ref 0–149)
VLDL Cholesterol Cal: 17 mg/dL (ref 5–40)

## 2020-02-23 DIAGNOSIS — Z23 Encounter for immunization: Secondary | ICD-10-CM | POA: Diagnosis not present

## 2020-03-19 HISTORY — PX: REPLACEMENT TOTAL KNEE: SUR1224

## 2020-04-07 DIAGNOSIS — M25561 Pain in right knee: Secondary | ICD-10-CM | POA: Diagnosis not present

## 2020-05-16 ENCOUNTER — Ambulatory Visit (INDEPENDENT_AMBULATORY_CARE_PROVIDER_SITE_OTHER): Payer: Medicare Other | Admitting: Physician Assistant

## 2020-05-16 ENCOUNTER — Other Ambulatory Visit: Payer: Self-pay

## 2020-05-16 ENCOUNTER — Encounter: Payer: Self-pay | Admitting: Physician Assistant

## 2020-05-16 VITALS — BP 146/85 | HR 68 | Temp 96.8°F | Ht 64.0 in | Wt 197.8 lb

## 2020-05-16 DIAGNOSIS — R5383 Other fatigue: Secondary | ICD-10-CM

## 2020-05-16 DIAGNOSIS — R059 Cough, unspecified: Secondary | ICD-10-CM | POA: Diagnosis not present

## 2020-05-16 DIAGNOSIS — Z1152 Encounter for screening for COVID-19: Secondary | ICD-10-CM

## 2020-05-16 NOTE — Patient Instructions (Signed)

## 2020-05-16 NOTE — Progress Notes (Signed)
Acute Office Visit  Subjective:    Patient ID: Savannah Hill, female    DOB: 28-Nov-1950, 70 y.o.   MRN: [id number]  Chief Complaint  Patient presents with  . Cough    HPI Patient is in today for c/o sore throat, postnasal drainage, dry cough, congestion and fatigue for 6 days. Patient performed an at home Covid test 2 days ago which resulted negative. Patient is fully vaccinated against ZTIWP-80 including booster.  Denies fever, shortness of breath, headache, sinus pressure or sick contact exposures. Patient has been taking care of her mother who is in the process of moving to an assisted living, is currently at [place] home.  Has tried Zyrtec with minimal relief.  Past Medical History:  Diagnosis Date  . Anemia    borderline  . Atrial fibrillation ([employer])    a. s/p ablation on 10/05/2016  . Cancer [facility])    cervical/rad hysterectomy/bso wiht chemo for small cell ca  . Dyspnea   . Heart valve disorder   . HLD (hyperlipidemia)   . Hypertension   . Joint pain   . Lower extremity edema   . Obesity   . Palpitations     Past Surgical History:  Procedure Laterality Date  . ABLATION OF DYSRHYTHMIC FOCUS  10/05/2016  . ATRIAL FIBRILLATION ABLATION N/A 10/05/2016   Procedure: Atrial Fibrillation Ablation;  Surgeon: Constance Haw, MD;  Location: [facility];  Service: Cardiovascular;  Laterality: N/A;  . IR RADIOLOGY PERIPHERAL GUIDED IV START  09/28/2016  . IR US GUIDE VASC ACCESS RIGHT  09/28/2016  . RADICAL ABDOMINAL HYSTERECTOMY  1986   with BSO  . TEE WITHOUT CARDIOVERSION N/A 06/07/2016   Procedure: TRANSESOPHAGEAL ECHOCARDIOGRAM (TEE);  Surgeon: Sanda Klein, MD;  Location: [facility];  Service: Cardiovascular;  Laterality: N/A;    Family History  Problem Relation Age of Onset  . Diabetes Mother   . Hypertension Mother   . Lung cancer Mother   . Heart disease Mother   . Thyroid disease Mother   . Depression Mother   . Alcoholism Mother   .  Skin cancer Father   . CVA Father   . Diabetes Father   . Hypertension Father   . Cancer Paternal Grandfather        unknown type    Social History   Socioeconomic History  . Marital status: Married    Spouse name: Tahja Liao  . Number of children: 1  . Years of education: Not on file  . Highest education level: Not on file  Occupational History  . 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 Smoking status: Never Smoker  . Smokeless tobacco: Never Used  Vaping Use  . Vaping Use: Never used  Substance and Sexual Activity  . Alcohol use: Yes    Alcohol/week: 4.0 - 5.0 standard drinks    Types: 4 - 5 Standard drinks or equivalent per week  . Drug use: No  . Sexual activity: Yes    Partners: Male    Birth control/protection: Surgical  Other Topics Concern  . 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 apixaban (ELIQUIS) 5 MG TABS tablet Take 1 tablet (5 mg total) by mouth 2 (two) times daily. 180 tablet 2  . Cholecalciferol (VITAMIN D3) 125 MCG (5000 UT) CAPS Take 1 capsule by mouth daily.    . citalopram (CELEXA) 20 MG tablet Take 1 tablet (20 mg total) by mouth daily. 90 tablet 3  . diltiazem (TIAZAC) 120 MG 24 hr capsule TAKE 2 CAPSULES (240 MG TOTAL) BY MOUTH DAILY. TO GOAL BLOOD PRESSURE AROUND 130/80 180 capsule 1  . docusate sodium (COLACE) 100 MG capsule Take 100 mg by mouth daily.    . furosemide (LASIX) 20 MG tablet Take 1 tablet (20 mg total) by mouth daily. 90 tablet 1  . Multiple Vitamin (MULTIVITAMIN WITH MINERALS)  TABS Take 1 tablet by mouth daily.    . rosuvastatin (CRESTOR) 5 MG tablet Take 1 tablet (5 mg total) by mouth at bedtime. 90 tablet 1  . traZODone (DESYREL) 100 MG tablet 1 tab po q hs prn sleep. 90 tablet 1   No facility-administered medications prior to visit.    No Known Allergies  Review of Systems A fourteen system review of systems was performed and found to be positive as per HPI.    Objective:    Physical Exam Constitutional:      General: She is not in acute distress.    Appearance: She is not toxic-appearing.  HENT:     Head: Normocephalic and atraumatic.     Right Ear: Tympanic membrane, ear canal and external ear normal. There is no impacted cerumen.     Left Ear: There is impacted cerumen.     Nose: Nose normal. No congestion or rhinorrhea.     Mouth/Throat:     Pharynx: Oropharynx is clear. No oropharyngeal exudate or posterior oropharyngeal erythema.  Eyes:     Extraocular Movements: Extraocular movements intact.     Conjunctiva/sclera: Conjunctivae normal.  Cardiovascular:     Rate and Rhythm: Normal rate and regular rhythm.     Pulses: Normal pulses.     Heart sounds: Normal heart sounds.  Pulmonary:     Effort: Pulmonary effort is normal. No respiratory distress.     Breath sounds: Normal breath sounds. No stridor. No wheezing, rhonchi or rales.  Musculoskeletal:     Cervical back: Normal range of motion and neck supple.  Lymphadenopathy:     Cervical: No cervical adenopathy.  Skin:    General: Skin is warm.  Neurological:     General: No focal deficit present.     Mental Status: She is alert.  Psychiatric:        Mood and Affect: Mood normal.        Behavior: Behavior normal.        Thought Content: Thought content normal.        Judgment: Judgment normal.      BP (!) 146/85   Pulse 68   Temp (!) 96.8 F (36 C)   Ht 5\' 4"  (1.626 m)   Wt 197 lb 12.8 oz (89.7 kg)   LMP 03/19/1984   SpO2 98%   BMI 33.95 kg/m  Wt Readings from Last 3  Encounters:  05/16/20 197 lb 12.8 oz (89.7 kg)  12/21/19 193 lb 14.4 oz (88 kg)  11/03/19 193 lb 3.2 oz (87.6 kg)    Health Maintenance Due  Topic Date Due  . COVID-19 Vaccine (3 - Pfizer risk 4-dose series) 05/30/2019    There are no preventive care reminders to display for this patient.   Lab Results  Component Value Date   TSH 4.950 (H) 06/12/2019   Lab Results  Component Value Date   WBC 8.4 06/12/2019   HGB 14.4 06/12/2019   HCT 41.9 06/12/2019   MCV 95 06/12/2019   PLT 404 06/12/2019   Lab Results  Component Value Date   NA 142 01/21/2020   K 4.6 01/21/2020   CO2 24 01/21/2020   GLUCOSE 84 01/21/2020   BUN 12 01/21/2020   CREATININE 0.95 01/21/2020   BILITOT 0.7 01/21/2020   ALKPHOS 84 01/21/2020   AST 22 01/21/2020   ALT 11 01/21/2020   PROT 7.2 01/21/2020   ALBUMIN 4.5 01/21/2020   CALCIUM 9.5 01/21/2020   GFR 51.14 (L) 02/22/2014   Lab Results  Component Value Date   CHOL 179 01/21/2020   Lab Results  Component Value Date   HDL 74 01/21/2020   Lab Results  Component Value Date   LDLCALC 88 01/21/2020   Lab Results  Component Value Date   TRIG 94 01/21/2020   Lab Results  Component Value Date   CHOLHDL 2.4 01/21/2020   Lab Results  Component Value Date   HGBA1C 5.4 06/12/2019       Assessment & Plan:   Problem List Items Addressed This Visit   None   Visit Diagnoses    Encounter for screening for COVID-19    -  Primary   Cough       Relevant Orders   Novel Coronavirus, NAA ([facility])   Fatigue, unspecified type         Encounter for screening for COVID-19: -Discussed with patient retesting for COVID-19, at home Covid test potentially false negative.  On exam no tenderness to palpation of frontal or maxillary sinus and no fever so less likely sinusitis and do not recommend antibiotic therapy at this time.  Patient verbalized understanding. -Recommend to follow latest CDC quarantine guidelines until COVID-19 results are  back. -Continue home supportive care and advised to take decongestant (mucinex DM). Patient declined benzonatate at this time.  No orders of the defined types were placed in this encounter.    Lorrene Reid, PA-C

## 2020-05-18 ENCOUNTER — Other Ambulatory Visit: Payer: Self-pay | Admitting: Physician Assistant

## 2020-05-18 ENCOUNTER — Encounter: Payer: Self-pay | Admitting: Physician Assistant

## 2020-05-18 ENCOUNTER — Other Ambulatory Visit: Payer: Self-pay

## 2020-05-18 ENCOUNTER — Other Ambulatory Visit: Payer: Medicare Other

## 2020-05-18 DIAGNOSIS — R059 Cough, unspecified: Secondary | ICD-10-CM

## 2020-05-18 DIAGNOSIS — Z1152 Encounter for screening for COVID-19: Secondary | ICD-10-CM

## 2020-05-18 LAB — NOVEL CORONAVIRUS, NAA

## 2020-05-18 MED ORDER — BENZONATATE 100 MG PO CAPS: 100.0000 mg | ORAL_CAPSULE | Freq: Three times a day (TID) | ORAL | 0 refills | Status: DC | PRN

## 2020-05-19 ENCOUNTER — Telehealth: Payer: Self-pay | Admitting: Physician Assistant

## 2020-05-19 DIAGNOSIS — J019 Acute sinusitis, unspecified: Secondary | ICD-10-CM

## 2020-05-19 LAB — SARS-COV-2 ANTIBODY, IGM

## 2020-05-19 MED ORDER — AZITHROMYCIN 250 MG PO TABS: ORAL_TABLET | ORAL | 0 refills | Status: DC

## 2020-05-19 NOTE — Telephone Encounter (Signed)
Patient is aware of lab results and Zpack sent to pharmacy per [facility]. AS, CMA

## 2020-05-19 NOTE — Telephone Encounter (Signed)
Please see the lab result notification. AS< CMA

## 2020-05-19 NOTE — Telephone Encounter (Signed)
Patient called to inquire about recent labs. following up for the results. Please call @ [phone]. thank you

## 2020-05-19 NOTE — Telephone Encounter (Signed)
patient called to inquire about lab results not showing yet on [facility], she will await and call back if not shown today. thank you

## 2020-06-13 ENCOUNTER — Other Ambulatory Visit: Payer: Self-pay | Admitting: Physician Assistant

## 2020-06-13 DIAGNOSIS — E785 Hyperlipidemia, unspecified: Secondary | ICD-10-CM

## 2020-06-16 DIAGNOSIS — L821 Other seborrheic keratosis: Secondary | ICD-10-CM | POA: Diagnosis not present

## 2020-06-16 DIAGNOSIS — D485 Neoplasm of uncertain behavior of skin: Secondary | ICD-10-CM | POA: Diagnosis not present

## 2020-06-16 DIAGNOSIS — L820 Inflamed seborrheic keratosis: Secondary | ICD-10-CM | POA: Diagnosis not present

## 2020-06-16 DIAGNOSIS — D3617 Benign neoplasm of peripheral nerves and autonomic nervous system of trunk, unspecified: Secondary | ICD-10-CM | POA: Diagnosis not present

## 2020-06-20 ENCOUNTER — Ambulatory Visit: Payer: Medicare Other | Admitting: Physician Assistant

## 2020-06-27 ENCOUNTER — Other Ambulatory Visit: Payer: Self-pay | Admitting: Family Medicine

## 2020-06-27 DIAGNOSIS — F5101 Primary insomnia: Secondary | ICD-10-CM

## 2020-07-04 ENCOUNTER — Other Ambulatory Visit: Payer: Self-pay | Admitting: Family Medicine

## 2020-07-04 DIAGNOSIS — F5101 Primary insomnia: Secondary | ICD-10-CM

## 2020-07-07 ENCOUNTER — Other Ambulatory Visit: Payer: Self-pay | Admitting: Physician Assistant

## 2020-07-07 DIAGNOSIS — F5101 Primary insomnia: Secondary | ICD-10-CM

## 2020-07-07 MED ORDER — TRAZODONE HCL 100 MG PO TABS: ORAL_TABLET | ORAL | 0 refills | Status: DC

## 2020-07-07 NOTE — Telephone Encounter (Signed)
Patient last seen in October and advised to follow up in 5 months.   Last refill given 05/07/19 #90 with 1 refill- last prescribed by [facility].

## 2020-07-07 NOTE — Telephone Encounter (Signed)
Patient needs a refill on her Trazodone and uses [facility]. Please Advise, thanks.

## 2020-07-07 NOTE — Addendum Note (Signed)
Addended by: Mickel Crow on: 07/07/2020 02:04 PM   Modules accepted: Orders

## 2020-07-11 NOTE — Telephone Encounter (Signed)
Patient scheduled.

## 2020-07-12 DIAGNOSIS — M1711 Unilateral primary osteoarthritis, right knee: Secondary | ICD-10-CM | POA: Diagnosis not present

## 2020-07-27 ENCOUNTER — Encounter: Payer: Self-pay | Admitting: Physician Assistant

## 2020-07-27 ENCOUNTER — Other Ambulatory Visit: Payer: Self-pay

## 2020-07-27 ENCOUNTER — Ambulatory Visit (INDEPENDENT_AMBULATORY_CARE_PROVIDER_SITE_OTHER): Payer: Medicare Other | Admitting: Physician Assistant

## 2020-07-27 VITALS — BP 122/85 | HR 79 | Temp 98.5°F | Ht 63.0 in | Wt 195.7 lb

## 2020-07-27 DIAGNOSIS — Z0000 Encounter for general adult medical examination without abnormal findings: Secondary | ICD-10-CM

## 2020-07-27 DIAGNOSIS — F5101 Primary insomnia: Secondary | ICD-10-CM

## 2020-07-27 DIAGNOSIS — I890 Lymphedema, not elsewhere classified: Secondary | ICD-10-CM

## 2020-07-27 DIAGNOSIS — I10 Essential (primary) hypertension: Secondary | ICD-10-CM

## 2020-07-27 DIAGNOSIS — I480 Paroxysmal atrial fibrillation: Secondary | ICD-10-CM | POA: Diagnosis not present

## 2020-07-27 DIAGNOSIS — E785 Hyperlipidemia, unspecified: Secondary | ICD-10-CM

## 2020-07-27 MED ORDER — ROSUVASTATIN CALCIUM 5 MG PO TABS
5.0000 mg | ORAL_TABLET | Freq: Every day | ORAL | 0 refills | Status: DC
Start: 2020-07-27 — End: 2021-02-21

## 2020-07-27 MED ORDER — TRAZODONE HCL 100 MG PO TABS: ORAL_TABLET | ORAL | 0 refills | Status: DC

## 2020-07-27 MED ORDER — DILTIAZEM HCL ER BEADS 120 MG PO CP24: ORAL_CAPSULE | ORAL | 1 refills | Status: DC

## 2020-07-27 MED ORDER — FUROSEMIDE 20 MG PO TABS: 20.0000 mg | ORAL_TABLET | Freq: Every day | ORAL | 1 refills | Status: DC

## 2020-07-27 NOTE — Progress Notes (Signed)
Subjective:   SHALISSA WAHEED is a 70 y.o. female who presents for Medicare Annual (Subsequent) preventive examination.  Review of Systems    Review of Systems: General:   No F/C, wt loss Pulm:   No DIB, SOB, pleuritic chest pain Card:  No CP, palpitations Abd:  No n/v/d or pain Ext:  +inc edema from baseline        Objective:    Today's Vitals   07/27/20 1431  BP: 122/85  Pulse: 79  Temp: 98.5 F (36.9 C)  SpO2: 97%  Weight: 195 lb 11.2 oz (88.8 kg)  Height: 5\' 3"  (1.6 m)   Body mass index is 34.67 kg/m.  Advanced Directives 10/05/2016 06/07/2016 10/01/2011  Does Patient Have a Medical Advance Directive? No Yes Patient has advance directive, copy not in chart  Type of Advance Directive - [place] in Chart? - No - copy requested -  Would patient like information on creating a medical advance directive? No - Patient declined - -  Pre-existing out of facility DNR order (yellow form or pink MOST form) - - No    Current Medications (verified) Outpatient Encounter Medications as of 07/27/2020  Medication Sig  . apixaban (ELIQUIS) 5 MG TABS tablet Take 1 tablet (5 mg total) by mouth 2 (two) times daily.  . benzonatate (TESSALON PERLES) 100 MG capsule Take 1 capsule (100 mg total) by mouth 3 (three) times daily as needed for cough.  . Cholecalciferol (VITAMIN D3) 125 MCG (5000 UT) CAPS Take 1 capsule by mouth daily.  . citalopram (CELEXA) 20 MG tablet Take 1 tablet (20 mg total) by mouth daily.  [name] Kitchen diltiazem (TIAZAC) 120 MG 24 hr capsule TAKE 2 CAPSULES (240 MG TOTAL) BY MOUTH DAILY. TO GOAL BLOOD PRESSURE AROUND 130/80  . docusate sodium (COLACE) 100 MG capsule Take 100 mg by mouth daily.  . furosemide (LASIX) 20 MG tablet Take 1 tablet (20 mg total) by mouth daily.  . Multiple Vitamin (MULTIVITAMIN WITH MINERALS) TABS Take 1 tablet by mouth daily.  . rosuvastatin (CRESTOR)  5 MG tablet TAKE 1 TABLET AT BEDTIME  . traZODone (DESYREL) 100 MG tablet 1 tab po q hs prn sleep.  . [DISCONTINUED] azithromycin (ZITHROMAX) 250 MG tablet Take 2 tabs by mouth on day 1, then take 1 tab daily until medication complete. (Patient not taking: Reported on 07/27/2020)   No facility-administered encounter medications on file as of 07/27/2020.    Allergies (verified) Patient has no known allergies.   History: Past Medical History:  Diagnosis Date  . Anemia    borderline  . Atrial fibrillation ([place])    a. s/p ablation on 10/05/2016  . Cancer [facility])    cervical/rad hysterectomy/bso wiht chemo for small cell ca  . Dyspnea   . Heart valve disorder   . HLD (hyperlipidemia)   . Hypertension   . Joint pain   . Lower extremity edema   . Obesity   . Palpitations    Past Surgical History:  Procedure Laterality Date  . ABLATION OF DYSRHYTHMIC FOCUS  10/05/2016  . ATRIAL FIBRILLATION ABLATION N/A 10/05/2016   Procedure: Atrial Fibrillation Ablation;  Surgeon: Constance Haw, MD;  Location: [facility];  Service: Cardiovascular;  Laterality: N/A;  . IR RADIOLOGY PERIPHERAL GUIDED IV START  09/28/2016  . IR US GUIDE VASC ACCESS RIGHT  09/28/2016  . [place]  with BSO  . TEE WITHOUT CARDIOVERSION N/A 06/07/2016   Procedure: TRANSESOPHAGEAL ECHOCARDIOGRAM (TEE);  Surgeon: Sanda Klein, MD;  Location: [facility] ENDOSCOPY;  Service: Cardiovascular;  Laterality: N/A;   Family History  Problem Relation Age of Onset  . Diabetes Mother   . Hypertension Mother   . Lung cancer Mother   . Heart disease Mother   . Thyroid disease Mother   . Depression Mother   . Alcoholism Mother   . Skin cancer Father   . CVA Father   . Diabetes Father   . Hypertension Father   . Cancer Paternal Grandfather        unknown type   Social History   Socioeconomic History  . Marital status: Married    Spouse name: Azra Abrell  . Number of children: 1  . Years  of education: Not on file  . Highest education level: Not on file  Occupational History  . 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 Smoking status: Never Smoker  . Smokeless tobacco: Never Used  Vaping Use  . Vaping Use: Never used  Substance and Sexual Activity  . Alcohol use: Yes    Alcohol/week: 4.0 - 5.0 standard drinks    Types: 4 - 5 Standard drinks or equivalent per week  . Drug use: No  . Sexual activity: Yes    Partners: Male    Birth control/protection: Surgical  Other Topics Concern  . 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 N N  Vision? N N  Difficulty concentrating or making decisions? N N  Walking or climbing stairs? N N  Dressing or bathing? N N  Doing errands, shopping? N N  Some recent data might be hidden    Patient Care Team: Lorrene Reid, PA-C as PCP - General (Physician Assistant) Constance Haw, MD as PCP - Cardiology (Cardiology) Megan Salon, MD as Consulting Physician (Gynecology) [facility] as Consulting Physician (Dermatology) Melrose Nakayama, MD as Consulting Physician (Orthopedic Surgery) [facility] (Specialist) Juanita Craver, MD as Consulting Physician (Gastroenterology)  Indicate any recent Medical Services you may have received from other than [facility] providers in the past year (date may be approximate).     Assessment:   This is a routine  wellness examination for [place].  Hearing/Vision screen No exam data present  Dietary issues and exercise activities discussed:  -Recommend to continue to stay as active as possible, limit etoh to one drink per day and no more than 7 per week. Reduce fried foods and potato chips. Continue good hydration.   Goals Addressed   None    Depression Screen PHQ 2/9 Scores 07/27/2020 05/16/2020 12/21/2019 06/17/2019 05/07/2019 02/10/2019 09/17/2018  PHQ - 2 Score 0 0 0 1 0 0 0  PHQ- 9 Score 0 1 0 2 0 1 3    Fall Risk Fall Risk  07/27/2020 05/16/2020 12/21/2019 06/17/2019 05/07/2019  Falls in the past year? 0 0 0 0 0  Number falls in past yr: 0 - - - 0  Injury with Fall? 0 - - - 0  Risk for fall due to : No Fall Risks - - - -  Follow up Follow up appointment Falls evaluation completed Falls evaluation completed Falls evaluation completed Falls evaluation completed    [place]:  Any stairs in or around the home? Yes  If so, are there any without handrails? Yes  Home free of loose throw rugs in walkways, pet beds, electrical cords, etc? Yes  Adequate lighting in your home to reduce risk of falls? Yes   ASSISTIVE DEVICES UTILIZED TO PREVENT FALLS:  Life alert? No  Use of a cane, walker or w/c? No  Grab bars in the bathroom? No  Shower chair or bench in shower? No  Elevated toilet seat or a handicapped toilet? No   TIMED UP AND GO:  Was the test performed? Yes .  Length of time to ambulate 10 feet: 10 sec.   Gait steady and fast without use of assistive device  Cognitive Function:     6CIT Screen 07/27/2020 06/17/2019  What Year? 0 points 0 points  What month? 0 points 0 points  What time? 0 points 0 points  Count back from 20 0 points 0 points  Months in reverse 2 points 0 points  Repeat phrase 0 points 6 points  Total Score 2 6    Immunizations Immunization History  Administered Date(s) Administered  . Influenza, High Dose Seasonal PF  12/12/2017, 01/14/2019  . Influenza-Unspecified 01/04/2020  . PFIZER(Purple Top)SARS-COV-2 Vaccination 04/11/2019, 05/02/2019  . Pneumococcal Conjugate-13 12/05/2016  . Pneumococcal Polysaccharide-23 10/02/2011, 12/23/2017  . Tdap 01/31/2011  . Zoster Recombinat (Shingrix) 07/07/2019, 02/03/2020    TDAP status: Up to date  Flu Vaccine status: Up to date  Pneumococcal vaccine status: Up to date  Covid-19 vaccine status: Completed vaccines  Qualifies for Shingles Vaccine? Yes   Zostavax completed Yes   Shingrix Completed?: Yes  Screening Tests Health Maintenance  Topic Date Due  . COVID-19 Vaccine (3 - Pfizer risk 4-dose series) 05/30/2019  . INFLUENZA VACCINE  10/17/2020  . TETANUS/TDAP  01/30/2021  . MAMMOGRAM  12/15/2021  . COLONOSCOPY (Pts 45-76yrs Insurance coverage will need to be confirmed)  11/09/2028  . DEXA SCAN  Completed  . Hepatitis C Screening  Completed  . PNA vac Low Risk Adult  Completed  . HPV VACCINES  Aged Out    Health Maintenance  Health Maintenance Due  Topic Date Due  . COVID-19 Vaccine (3 - Pfizer risk 4-dose series) 05/30/2019    Colonoscopy: 10/2018 repeat in 7 years   Mammogram status: Completed 12/16/2019. Repeat every year  Bone Density status: Completed [id number]. Results reflect: Bone density results: NORMAL. Repeat every 3 years.  Lung Cancer Screening: (Low Dose CT Chest recommended if Age 4-80 years, 30 pack-year currently smoking OR have quit w/in 15years.) does not qualify.   Lung Cancer Screening Referral:   Additional Screening:  Hepatitis C Screening: Does qualify; Completed   Vision Screening: Recommended annual ophthalmology exams for early detection of glaucoma and other disorders of the eye. Is the patient up to date with their annual eye exam?  Yes  Who is the provider or what is the name of the office in which the patient attends annual eye exams?  If pt is not established with a  provider, would they like to be  referred to a provider to establish care? Yes .   Dental Screening: Recommended annual dental exams for proper oral hygiene  Community Resource Referral / Chronic Care Management: CRR required this visit?  No   CCM required this visit?  No      Plan:  -Continue to follow up with various specialists. -Continue current medication regimen. Provided refills.  -Schedule lab visit for FBW in 1-3 weeks. -Follow up in 6 months for HTN, HLD  I have personally reviewed and noted the following in the patient's chart:   . Medical and social history . Use of alcohol, tobacco or illicit drugs  . Current medications and supplements including opioid prescriptions.  . Functional ability and status . Nutritional status . Physical activity . Advanced directives . List of other physicians . Hospitalizations, surgeries, and ER visits in previous 12 months . Vitals . Screenings to include cognitive, depression, and falls . Referrals and appointments  In addition, I have reviewed and discussed with patient certain preventive protocols, quality metrics, and best practice recommendations. A written personalized care plan for preventive services as well as general preventive health recommendations were provided to patient.

## 2020-07-27 NOTE — Patient Instructions (Signed)
Preventive Care 70 Years and Older, Female Preventive care refers to lifestyle choices and visits with your health care provider that can promote health and wellness. This includes:  A yearly physical exam. This is also called an annual wellness visit.  Regular dental and eye exams.  Immunizations.  Screening for certain conditions.  Healthy lifestyle choices, such as: ? Eating a healthy diet. ? Getting regular exercise. ? Not using drugs or products that contain nicotine and tobacco. ? Limiting alcohol use. What can I expect for my preventive care visit? Physical exam Your health care provider will check your:  Height and weight. These may be used to calculate your BMI (body mass index). BMI is a measurement that tells if you are at a healthy weight.  Heart rate and blood pressure.  Body temperature.  Skin for abnormal spots. Counseling Your health care provider may ask you questions about your:  Past medical problems.  Family's medical history.  Alcohol, tobacco, and drug use.  Emotional well-being.  Home life and relationship well-being.  Sexual activity.  Diet, exercise, and sleep habits.  History of falls.  Memory and ability to understand (cognition).  Work and work Statistician.  Pregnancy and menstrual history.  Access to firearms. What immunizations do I need? Vaccines are usually given at various ages, according to a schedule. Your health care provider will recommend vaccines for you based on your age, medical history, and lifestyle or other factors, such as travel or where you work.   What tests do I need? Blood tests  Lipid and cholesterol levels. These may be checked every 5 years, or more often depending on your overall health.  Hepatitis C test.  Hepatitis B test. Screening  Lung cancer screening. You may have this screening every year starting at age 74 if you have a 30-pack-year history of smoking and currently smoke or have quit within  the past 15 years.  Colorectal cancer screening. ? All adults should have this screening starting at age 44 and continuing until age 58. ? Your health care provider may recommend screening at age 2 if you are at increased risk. ? You will have tests every 1-10 years, depending on your results and the type of screening test.  Diabetes screening. ? This is done by checking your blood sugar (glucose) after you have not eaten for a while (fasting). ? You may have this done every 1-3 years.  Mammogram. ? This may be done every 1-2 years. ? Talk with your health care provider about how often you should have regular mammograms.  Abdominal aortic aneurysm (AAA) screening. You may need this if you are a current or former smoker.  BRCA-related cancer screening. This may be done if you have a family history of breast, ovarian, tubal, or peritoneal cancers. Other tests  STD (sexually transmitted disease) testing, if you are at risk.  Bone density scan. This is done to screen for osteoporosis. You may have this done starting at age 104. Talk with your health care provider about your test results, treatment options, and if necessary, the need for more tests. Follow these instructions at home: Eating and drinking  Eat a diet that includes fresh fruits and vegetables, whole grains, lean protein, and low-fat dairy products. Limit your intake of foods with high amounts of sugar, saturated fats, and salt.  Take vitamin and mineral supplements as recommended by your health care provider.  Do not drink alcohol if your health care provider tells you not to drink.  If you drink alcohol: ? Limit how much you have to 0-1 drink a day. ? Be aware of how much alcohol is in your drink. In the [place] one drink equals one 12 oz bottle of beer (355 mL), one 5 oz glass of wine (148 mL), or one 1 oz glass of hard liquor (44 mL).   Lifestyle  Take daily care of your teeth and gums. Brush your teeth every morning  and night with fluoride toothpaste. Floss one time each day.  Stay active. Exercise for at least 30 minutes 5 or more days each week.  Do not use any products that contain nicotine or tobacco, such as cigarettes, e-cigarettes, and chewing tobacco. If you need help quitting, ask your health care provider.  Do not use drugs.  If you are sexually active, practice safe sex. Use a condom or other form of protection in order to prevent STIs (sexually transmitted infections).  Talk with your health care provider about taking a low-dose aspirin or statin.  Find healthy ways to cope with stress, such as: ? Meditation, yoga, or listening to music. ? Journaling. ? Talking to a trusted person. ? Spending time with friends and family. Safety  Always wear your seat belt while driving or riding in a vehicle.  Do not drive: ? If you have been drinking alcohol. Do not ride with someone who has been drinking. ? When you are tired or distracted. ? While texting.  Wear a helmet and other protective equipment during sports activities.  If you have firearms in your house, make sure you follow all gun safety procedures. What's next?  Visit your health care provider once a year for an annual wellness visit.  Ask your health care provider how often you should have your eyes and teeth checked.  Stay up to date on all vaccines. This information is not intended to replace advice given to you by your health care provider. Make sure you discuss any questions you have with your health care provider. Document Revised: 02/24/2020 Document Reviewed: 02/27/2018 Elsevier Patient Education  2021 Elsevier Inc.  

## 2020-07-28 DIAGNOSIS — M25561 Pain in right knee: Secondary | ICD-10-CM | POA: Diagnosis not present

## 2020-07-29 ENCOUNTER — Ambulatory Visit: Payer: Medicare Other

## 2020-08-04 DIAGNOSIS — M1711 Unilateral primary osteoarthritis, right knee: Secondary | ICD-10-CM | POA: Diagnosis not present

## 2020-08-05 ENCOUNTER — Telehealth: Payer: Self-pay | Admitting: Cardiology

## 2020-08-05 NOTE — Telephone Encounter (Signed)
   [facility] Pre-operative Risk Assessment    Request for surgical clearance:  1. What type of surgery is being performed? Right Knee Replacement  2. When is this surgery scheduled? 09/02/2020  3. What type of clearance is required (medical clearance vs. Pharmacy clearance to hold med vs. Both)? Both  4. Are there any medications that need to be held prior to surgery and how long?apixaban (ELIQUIS) 5 MG TABS tablet but defering to Dr.Camnitz  5. Practice name and name of physician performing surgery?[facility] and Dr.Frank Rowan  6. What is the office phone number? [phone]   7.   What is the office fax number? [phone]  8.   Anesthesia type (None, local, MAC, general) ? Spinal    Savannah Hill 08/05/2020, 12:02 PM  _________________________________________________________________   (provider comments below)

## 2020-08-05 NOTE — Telephone Encounter (Signed)
Patient with diagnosis of afib on Eliquis for anticoagulation.    Procedure: right knee replacement Date of procedure: 09/02/20  CHA2DS2-VASc Score = 3  This indicates a 3.2% annual risk of stroke. The patient's score is based upon: CHF History: No HTN History: Yes Diabetes History: No Stroke History: No Vascular Disease History: No Age Score: 1 Gender Score: 1     CrCl 59 ml/min  Per office protocol, patient can hold Eliquis for 3 days prior to procedure.

## 2020-08-05 NOTE — Telephone Encounter (Signed)
Primary [place], MD  Chart reviewed as part of pre-operative protocol coverage. Because of ASA FATH past medical history and time since last visit, he/she will require a follow-up visit in order to better assess preoperative cardiovascular risk.  Pre-op covering staff: - Please schedule appointment and call patient to inform them. - Please contact requesting surgeon's office via preferred method (i.e, phone, fax) to inform them of need for appointment prior to surgery.  If applicable, this message will also be routed to pharmacy pool and/or primary cardiologist for input on holding anticoagulant/antiplatelet agent as requested below so that this information is available at time of patient's appointment.   Deberah Pelton, NP  08/05/2020, 4:38 PM

## 2020-08-05 NOTE — Telephone Encounter (Signed)
Will send message to [place] to reach to the pt with an appt for pre op with Dr. Curt Bears or EP APP

## 2020-08-08 ENCOUNTER — Telehealth: Payer: Self-pay | Admitting: Physician Assistant

## 2020-08-08 NOTE — Telephone Encounter (Signed)
EP scheduler [place] reached out to the pt though no answer.

## 2020-08-08 NOTE — Telephone Encounter (Signed)
Surgical clearance form has been faxed to our office from [facility].   Patient needs to schedule pre-operative clearance for form to be completed by our office. Left msg advising patient to call office to schedule.    Original form on [employer]. AS, CMA

## 2020-08-09 ENCOUNTER — Other Ambulatory Visit: Payer: Self-pay

## 2020-08-09 ENCOUNTER — Telehealth: Payer: Self-pay | Admitting: Physician Assistant

## 2020-08-09 DIAGNOSIS — I890 Lymphedema, not elsewhere classified: Secondary | ICD-10-CM

## 2020-08-09 NOTE — Telephone Encounter (Signed)
Patient requires a surgical clearance visit. Any labs or testing done will be obtained at that time. Please advise patient of this and contact her to schedule. Thank you. AS, CMA

## 2020-08-09 NOTE — Telephone Encounter (Signed)
Patient was returning Savannah Hill's call about a surgical clearance form. I advised her she needed an appointment and she mentioned she needs bloodwork. Please advise, thanks.

## 2020-08-09 NOTE — Telephone Encounter (Signed)
Patient scheduled.

## 2020-08-09 NOTE — Telephone Encounter (Signed)
Pt has pre op appt with Savannah Hill, [facility] 08/17/20. Will forward clearance notes to [facility] for upcoming appt. Will send FYI to requesting office pt has appt 08/17/20.

## 2020-08-11 ENCOUNTER — Encounter: Payer: Self-pay | Admitting: Vascular Surgery

## 2020-08-11 ENCOUNTER — Ambulatory Visit (INDEPENDENT_AMBULATORY_CARE_PROVIDER_SITE_OTHER): Payer: Medicare Other | Admitting: Vascular Surgery

## 2020-08-11 ENCOUNTER — Other Ambulatory Visit: Payer: Self-pay

## 2020-08-11 ENCOUNTER — Ambulatory Visit (HOSPITAL_COMMUNITY)
Admission: RE | Admit: 2020-08-11 | Discharge: 2020-08-11 | Disposition: A | Discharge status: Home / Self Care | Payer: Medicare Other | Source: Ambulatory Visit | Attending: Vascular Surgery | Admitting: Vascular Surgery

## 2020-08-11 VITALS — BP 157/87 | HR 65 | Temp 97.3°F | Resp 18 | Ht 63.0 in | Wt 197.2 lb

## 2020-08-11 DIAGNOSIS — I890 Lymphedema, not elsewhere classified: Secondary | ICD-10-CM | POA: Insufficient documentation

## 2020-08-11 DIAGNOSIS — I872 Venous insufficiency (chronic) (peripheral): Secondary | ICD-10-CM | POA: Diagnosis not present

## 2020-08-11 NOTE — Progress Notes (Addendum)
ASSESSMENT & PLAN   LYMPHEDEMA: Her right lower extremity swelling I believe is secondary to secondary lymphedema related to her pelvic lymph node dissection in the past.  She has significant swelling as documented below and hyperkeratosis on exam.  We have discussed the the importance of intermittent leg elevation and the proper positioning for this.  She has been doing this for the last several months.  We wrapped her leg with Ace bandages for compression as she does not currently tolerate compression stockings.  If we can get the swelling under better control and once she has recovered from her knee surgery I think she should be fitted for compression stockings.  I encouraged her to avoid prolonged sitting and standing.  We discussed the importance of exercise.  Given her knee arthritis I think water aerobics would be an excellent way to exercise as this will help given her arthritis in the knee and also is very helpful for patients with venous disease.  We have also discussed the importance of weight management as central obesity especially increases lower extremity venous pressure.  In addition, I think she would benefit from the BioTAB lymphedema pump.  I do not think her lymphedema is necessarily a contraindication to her planned knee replacement.  Certainly the lymphedema could worsen after surgery.   CHRONIC VENOUS INSUFFICIENCY: Based on her venous duplex scan she does have evidence of deep and superficial venous reflux on the right.  However based on my assessment of the vein I do not think that the reflux in her great saphenous vein or anterior accessory saphenous vein are significantly contributing to her right lower extremity swelling.  As noted above I think this is almost surely caused by secondary lymphedema secondary to her previous pelvic lymph node dissection.  Regardless she understands that the treatment for chronic venous insufficiency and lymphedema are the same.  That is, leg  elevation, compression therapy, exercise, careful weight management, and avoiding prolonged sitting and standing.  She will need aggressive DVT prophylaxis around the time of surgery given her chronic venous insufficiency.   REASON FOR CONSULT:    Lymphedema.  The consult is requested by Dr. Mayer Camel.  HPI:   Savannah Hill is a 70 y.o. female who has a history of lymphedema of the right lower extremity.  The patient is being considered for right knee replacement and was sent for vascular consultation to hopefully improve the swelling in her right leg prior to surgery.  The patient has a history of a cervical cancer and had 40 pelvic lymph nodes excised when she was 70 years old.  Subsequent to that she developed right lower extremity swelling consistent with secondary lymphedema.    I have reviewed the records from the referring office.  She has had significant right knee pain and is felt to be a good candidate for right knee replacement.  I believe this is scheduled for June 17.  She is on Eliquis for atrial fibrillation.  She has had a difficult time getting compression stockings that fit correctly and therefore has not been wearing compression stockings for the most part.  She does elevate her legs some which helps her swelling some.  She has had no previous venous procedures and has no previous history of DVT.  Her swelling is asymptomatic.  Her only complaint is significant pain in the knee related to her arthritis and knee issues.  She tries to exercise but her activity is limited by her knee arthritis and right  knee pain.  Past Medical History:  Diagnosis Date  . Anemia    borderline  . Atrial fibrillation ([place])    a. s/p ablation on 10/05/2016  . Cancer [facility])    cervical/rad hysterectomy/bso wiht chemo for small cell ca  . Dyspnea   . Heart valve disorder   . HLD (hyperlipidemia)   . Hypertension   . Joint pain   . Lower extremity edema   . Obesity   . Palpitations     Family  History  Problem Relation Age of Onset  . Diabetes Mother   . Hypertension Mother   . Lung cancer Mother   . Heart disease Mother   . Thyroid disease Mother   . Depression Mother   . Alcoholism Mother   . Skin cancer Father   . CVA Father   . Diabetes Father   . Hypertension Father   . Cancer Paternal Grandfather        unknown type    SOCIAL HISTORY: Social History   Tobacco Use  . Smoking status: Never Smoker  . Smokeless tobacco: Never Used  Substance Use Topics  . Alcohol use: Yes    Alcohol/week: 4.0 - 5.0 standard drinks    Types: 4 - 5 Standard drinks or equivalent per week    No Known Allergies  Current Outpatient Medications  Medication Sig Dispense Refill  . apixaban (ELIQUIS) 5 MG TABS tablet Take 1 tablet (5 mg total) by mouth 2 (two) times daily. 180 tablet 2  . Cholecalciferol (VITAMIN D3) 125 MCG (5000 UT) CAPS Take 1 capsule by mouth daily.    . citalopram (CELEXA) 20 MG tablet Take 1 tablet (20 mg total) by mouth daily. 90 tablet 3  . diltiazem (TIAZAC) 120 MG 24 hr capsule TAKE 2 CAPSULES (240 MG TOTAL) BY MOUTH DAILY. TO GOAL BLOOD PRESSURE AROUND 130/80 180 capsule 1  . docusate sodium (COLACE) 100 MG capsule Take 100 mg by mouth daily.    . furosemide (LASIX) 20 MG tablet Take 1 tablet (20 mg total) by mouth daily. 90 tablet 1  . Multiple Vitamin (MULTIVITAMIN WITH MINERALS) TABS Take 1 tablet by mouth daily.    . rosuvastatin (CRESTOR) 5 MG tablet Take 1 tablet (5 mg total) by mouth at bedtime. 90 tablet 0  . traZODone (DESYREL) 100 MG tablet 1 tab po q hs prn sleep. 30 tablet 0  . benzonatate (TESSALON PERLES) 100 MG capsule Take 1 capsule (100 mg total) by mouth 3 (three) times daily as needed for cough. (Patient not taking: Reported on 08/11/2020) 30 capsule 0   No current facility-administered medications for this visit.    REVIEW OF SYSTEMS:  [X]  denotes positive finding, [ ]  denotes negative finding Cardiac  Comments:  Chest pain or chest  pressure:    Shortness of breath upon exertion:    Short of breath when lying flat:    Irregular heart rhythm:        Vascular    Pain in calf, thigh, or hip brought on by ambulation:    Pain in feet at night that wakes you up from your sleep:     Blood clot in your veins:    Leg swelling:  x       Pulmonary    Oxygen at home:    Productive cough:     Wheezing:         Neurologic    Sudden weakness in arms or legs:     Sudden  numbness in arms or legs:     Sudden onset of difficulty speaking or slurred speech:    Temporary loss of vision in one eye:     Problems with dizziness:         Gastrointestinal    Blood in stool:     Vomited blood:         Genitourinary    Burning when urinating:     Blood in urine:        Psychiatric    Major depression:         Hematologic    Bleeding problems:    Problems with blood clotting too easily:        Skin    Rashes or ulcers:        Constitutional    Fever or chills:    -  PHYSICAL EXAM:   Vitals:   08/11/20 1531  BP: (!) 157/87  Pulse: 65  Resp: 18  Temp: (!) 97.3 F (36.3 C)  TempSrc: Temporal  SpO2: 100%  Weight: 197 lb 3.2 oz (89.4 kg)  Height: 5\' 3"  (1.6 m)   Body mass index is 34.93 kg/m.   GENERAL: The patient is a well-nourished female, in no acute distress. The vital signs are documented above. CARDIAC: There is a regular rate and rhythm.  VASCULAR: I do not detect carotid bruits. Because of her leg swelling is somewhat difficult to palpate her pulses but with the Doppler she has a biphasic dorsalis pedis and posterior tibial signal bilaterally. She has significant right lower extremity swelling in a distribution consistent with lymphedema.  She has hyperkeratosis as documented in the photograph below.     I did look at the right great saphenous vein and anterior accessory saphenous vein myself with the SonoSite.  There is reflux in the right anterior sensory saphenous vein however this is small not  significantly dilated.  There is reflux in the right great saphenous vein also.  Diameters range from 4 to 5 mm.  PULMONARY: There is good air exchange bilaterally without wheezing or rales. ABDOMEN: Soft and non-tender with normal pitched bowel sounds.  MUSCULOSKELETAL: There are no major deformities. NEUROLOGIC: No focal weakness or paresthesias are detected. SKIN: There are no ulcers or rashes noted. PSYCHIATRIC: The patient has a normal affect.  DATA:     VENOUS DUPLEX: The results of her venous duplex scan are summarized on the diagram below.    Her venous duplex scan on the right showed no evidence of DVT.  There was deep venous reflux involving the common femoral vein.  There was superficial venous reflux involving the anterior accessory saphenous vein in the great saphenous vein with diameters as noted above.  There was a short segment of reflux in the proximal calf in the small saphenous vein however the vein was only 2 mm in diameter.    Deitra Mayo [facility]: [phone] Office: [phone]

## 2020-08-15 NOTE — Progress Notes (Signed)
Cardiology Office Note Date:  08/17/2020  Patient ID:  Savannah Hill, Savannah Hill 1951-03-03, MRN [id number] PCP:  Lorrene Reid, PA-C  Electrophysiologist: Dr. Curt Bears   Chief Complaint: pre-op  History of Present Illness: Savannah Hill is a 70 y.o. female with history of HTN, obesity, Afib, PVCs, lymphedema (2/2 h/o pelvic lymph node dissection), chronic venous insufficiency (follows with vasc, Dr. Scot Dock)  She comes in today to be seen for Dr. Curt Bears, last seen by him Aug 2021, doing well, no changes were made.  Pending R knee replacement, scheduled for 09/02/20, planned for spinal anesthesia. [place] advised 3 days hold Eliquis  TODAY Outside of her knee she is doing great No symptoms of AFib or cardiac awareness of any kind. No CP, palpitations, no SOB, DOE. No dizzy spells, near syncope or syncope.  Despite her knee she has minimal physical limitations  RCRI score: zero, 0.4%risk DUKE: 7.99METS  Afib Hx Diagnosed 2013 PVI ablation 2018   Past Medical History:  Diagnosis Date  . Anemia    borderline  . Atrial fibrillation ([place])    a. s/p ablation on 10/05/2016  . Cancer [facility])    cervical/rad hysterectomy/bso wiht chemo for small cell ca  . Dyspnea   . Heart valve disorder   . HLD (hyperlipidemia)   . Hypertension   . Joint pain   . Lower extremity edema   . Obesity   . Palpitations     Past Surgical History:  Procedure Laterality Date  . ABLATION OF DYSRHYTHMIC FOCUS  10/05/2016  . ATRIAL FIBRILLATION ABLATION N/A 10/05/2016   Procedure: Atrial Fibrillation Ablation;  Surgeon: Constance Haw, MD;  Location: [place] CV LAB;  Service: Cardiovascular;  Laterality: N/A;  . IR RADIOLOGY PERIPHERAL GUIDED IV START  09/28/2016  . IR US GUIDE VASC ACCESS RIGHT  09/28/2016  . RADICAL ABDOMINAL HYSTERECTOMY  1986   with BSO  . TEE WITHOUT CARDIOVERSION N/A 06/07/2016   Procedure: TRANSESOPHAGEAL ECHOCARDIOGRAM (TEE);  Surgeon: Sanda Klein, MD;   Location: [facility] ENDOSCOPY;  Service: Cardiovascular;  Laterality: N/A;    Current Outpatient Medications  Medication Sig Dispense Refill  . apixaban (ELIQUIS) 5 MG TABS tablet Take 1 tablet (5 mg total) by mouth 2 (two) times daily. 180 tablet 2  . Cholecalciferol (VITAMIN D3) 125 MCG (5000 UT) CAPS Take 1 capsule by mouth daily.    . citalopram (CELEXA) 20 MG tablet Take 1 tablet (20 mg total) by mouth daily. 90 tablet 3  . diltiazem (TIAZAC) 120 MG 24 hr capsule TAKE 2 CAPSULES (240 MG TOTAL) BY MOUTH DAILY. TO GOAL BLOOD PRESSURE AROUND 130/80 180 capsule 1  . docusate sodium (COLACE) 100 MG capsule Take 100 mg by mouth daily.    . furosemide (LASIX) 20 MG tablet Take 1 tablet (20 mg total) by mouth daily. 90 tablet 1  . Multiple Vitamin (MULTIVITAMIN WITH MINERALS) TABS Take 1 tablet by mouth daily.    . rosuvastatin (CRESTOR) 5 MG tablet Take 1 tablet (5 mg total) by mouth at bedtime. 90 tablet 0  . traZODone (DESYREL) 100 MG tablet 1 tab po q hs prn sleep. 30 tablet 0   No current facility-administered medications for this visit.    Allergies:   Patient has no known allergies.   Social History:  The patient  reports that she has never smoked. She has never used smokeless tobacco. She reports current alcohol use of about 4.0 - 5.0 standard drinks of alcohol per week. She reports that  she does not use drugs.   Family History:  The patient's family history includes Alcoholism in her mother; CVA in her father; Cancer in her paternal grandfather; Depression in her mother; Diabetes in her father and mother; Heart disease in her mother; Hypertension in her father and mother; Lung cancer in her mother; Skin cancer in her father; Thyroid disease in her mother.  ROS:  Please see the history of present illness.    All other systems are reviewed and otherwise negative.   PHYSICAL EXAM:  VS:  BP (!) 144/92   Pulse 66   Ht 5\' 3"  (1.6 m)   Wt 198 lb 6.4 oz (90 kg)   LMP 03/19/1984   SpO2 95%    BMI 35.14 kg/m  BMI: Body mass index is 35.14 kg/m. Well nourished, well developed, in no acute distress HEENT: normocephalic, atraumatic Neck: no JVD, carotid bruits or masses Cardiac:  RRR; no significant murmurs, no rubs, or gallops Lungs:  CTA b/l, no wheezing, rhonchi or rales Abd: soft, nontender MS: no deformity or atrophy Ext: asymmetric edema (chrionically RLE swelling 2/2 her chronic venous stasis) no pitting edema Skin: warm and dry, no rash Neuro:  No gross deficits appreciated Psych: euthymic mood, full affect    EKG:  Done today and reviewed by myself shows  SR 66bpm, 1st degree AVblock, LAD (no notable changes from prior)   10/05/2016: EPS/Ablation CONCLUSIONS: 1. Sinus rhythm upon presentation.   2. Successful electrical isolation and anatomical encircling of all four pulmonary veins with radiofrequency current. 3. No inducible arrhythmias following ablation both on and off of dobutamine 4. No early apparent complications.    TTE  06/07/16 - Left ventricle: Systolic function was normal. The estimated ejection fraction was in the range of 55% to 60%. Wall motion was normal; there were no regional wall motion abnormalities. There was a reduced contribution of atrial contraction to ventricular filling, due to increased ventricular diastolic pressure or atrial contractile dysfunction. Features are consistent with a pseudonormal left ventricular filling pattern, with concomitant abnormal relaxation and increased filling pressure (grade 2 diastolic dysfunction). - Aortic valve: No evidence of vegetation. - Mitral valve: Moderately dilated annulus. Structurally normal valve. There was moderate regurgitation directed centrally. - Left atrium: The atrium was mildly to moderately dilated. No evidence of thrombus in the atrial cavity or appendage. - Right atrium: No evidence of thrombus in the atrial cavity or appendage. No evidence of thrombus  in the atrial cavity or appendage. - Atrial septum: No defect or patent foramen ovale was identified. There was no right-to-left atrial level shunt, following an increase in RA pressure induced by provocative maneuvers. - Pulmonic valve: No evidence of vegetation. - Pulmonary arteries: Systolic pressure was mildly increased. PA peak pressure: 40 mm Hg (S). - Pericardium, extracardiac: A trivial pericardial effusion was identified.  SPECT 06/15/16  Nuclear stress EF: 58%.  There was no ST segment deviation noted during stress.  The study is normal.  This is a low risk study.  The left ventricular ejection fraction is hyperdynamic (>65%).  Normal pharmacologic nuclear stress test with no evidence of prior infarct or ischemia.     Recent Labs: 01/21/2020: ALT 11; BUN 12; Creatinine, Ser 0.95; Potassium 4.6; Sodium 142  01/21/2020: Chol/HDL Ratio 2.4; Cholesterol, Total 179; HDL 74; LDL Chol Calc (NIH) 88; Triglycerides 94   CrCl cannot be calculated (Patient's most recent lab result is older than the maximum 21 days allowed.).   Wt Readings from Last 3 Encounters:  08/17/20 198 lb 6.4 oz (90 kg)  08/11/20 197 lb 3.2 oz (89.4 kg)  07/27/20 195 lb 11.2 oz (88.8 kg)     Other studies reviewed: Additional studies/records reviewed today include: summarized above  ASSESSMENT AND PLAN:  1. Paroxysmal Afib     CHA2DS2Vasc is 3, on Eliquis, appropriately dosed     No symptoms of AFib  OK to hold 3 days prior to her knee surgery To resume post op as directed by her surgeon  2. HTN     A recheck was better 136/84     Monitor at home  3. PVCs     No symptoms of PVCs     None on today's EKG  4. VHD     Mod MR by echo in 2018     Will plan to update her echo in the fall  5. Pre-op     Low risk surgery, low CV risk score     No cardiac contraindication to her knee surgery planned   Disposition: F/u with Dr. Curt Bears in the fall Aug/Sept time, sooner if  needed  Current medicines are reviewed at length with the patient today.  The patient did not have any concerns regarding medicines.  Venetia Night, PA-C 08/17/2020 4:40 PM     [address] [place] [id number] [phone] (office)  [phone] (fax)

## 2020-08-17 ENCOUNTER — Other Ambulatory Visit: Payer: Self-pay

## 2020-08-17 ENCOUNTER — Encounter: Payer: Self-pay | Admitting: Physician Assistant

## 2020-08-17 ENCOUNTER — Ambulatory Visit (INDEPENDENT_AMBULATORY_CARE_PROVIDER_SITE_OTHER): Payer: Medicare Other | Admitting: Physician Assistant

## 2020-08-17 VITALS — BP 144/92 | HR 66 | Ht 63.0 in | Wt 198.4 lb

## 2020-08-17 DIAGNOSIS — I493 Ventricular premature depolarization: Secondary | ICD-10-CM | POA: Diagnosis not present

## 2020-08-17 DIAGNOSIS — I480 Paroxysmal atrial fibrillation: Secondary | ICD-10-CM

## 2020-08-17 DIAGNOSIS — I10 Essential (primary) hypertension: Secondary | ICD-10-CM | POA: Diagnosis not present

## 2020-08-17 DIAGNOSIS — I340 Nonrheumatic mitral (valve) insufficiency: Secondary | ICD-10-CM | POA: Diagnosis not present

## 2020-08-17 DIAGNOSIS — Z01810 Encounter for preprocedural cardiovascular examination: Secondary | ICD-10-CM

## 2020-08-17 DIAGNOSIS — Z01818 Encounter for other preprocedural examination: Secondary | ICD-10-CM

## 2020-08-17 MED ORDER — ELIQUIS 5 MG PO TABS: 5.0000 mg | ORAL_TABLET | Freq: Two times a day (BID) | ORAL | 2 refills | Status: DC

## 2020-08-17 NOTE — Patient Instructions (Addendum)
Medication Instructions:   Your physician recommends that you continue on your current medications as directed. Please refer to the Current Medication list given to you today.  *If you need a refill on your cardiac medications before your next appointment, please call your pharmacy*   Lab Work: [place]   If you have labs (blood work) drawn today and your tests are completely normal, you will receive your results only by: [name] Kitchen MyChart Message (if you have MyChart) OR . A paper copy in the mail If you have any lab test that is abnormal or we need to change your treatment, we will call you to review the results.   Testing/Procedures: NONE ORDERED  TODAY   Follow-Up: At [facility], you and your health needs are our priority.  As part of our continuing mission to provide you with exceptional heart care, we have created designated Provider Care Teams.  These Care Teams include your primary Cardiologist (physician) and Advanced Practice Providers (APPs -  Physician Assistants and Nurse Practitioners) who all work together to provide you with the care you need, when you need it.  We recommend signing up for the patient portal called "MyChart".  Sign up information is provided on this After Visit Summary.  MyChart is used to connect with patients for Virtual Visits (Telemedicine).  Patients are able to view lab/test results, encounter notes, upcoming appointments, etc.  Non-urgent messages can be sent to your provider as well.   To learn more about what you can do with MyChart, go to [email or web address].    Your next appointment:   3-4 month(s)  The format for your next appointment:   In Person  Provider:   Allegra Lai, MD   Other Instructions

## 2020-08-18 ENCOUNTER — Encounter: Payer: Self-pay | Admitting: Nurse Practitioner

## 2020-08-18 ENCOUNTER — Ambulatory Visit (INDEPENDENT_AMBULATORY_CARE_PROVIDER_SITE_OTHER): Payer: Medicare Other | Admitting: Nurse Practitioner

## 2020-08-18 VITALS — BP 116/75 | HR 66 | Temp 99.0°F | Ht 63.0 in | Wt 196.0 lb

## 2020-08-18 DIAGNOSIS — Z7901 Long term (current) use of anticoagulants: Secondary | ICD-10-CM | POA: Diagnosis not present

## 2020-08-18 DIAGNOSIS — Z01818 Encounter for other preprocedural examination: Secondary | ICD-10-CM | POA: Insufficient documentation

## 2020-08-18 DIAGNOSIS — E785 Hyperlipidemia, unspecified: Secondary | ICD-10-CM | POA: Diagnosis not present

## 2020-08-18 DIAGNOSIS — Z0000 Encounter for general adult medical examination without abnormal findings: Secondary | ICD-10-CM

## 2020-08-18 DIAGNOSIS — I10 Essential (primary) hypertension: Secondary | ICD-10-CM | POA: Diagnosis not present

## 2020-08-18 DIAGNOSIS — F5105 Insomnia due to other mental disorder: Secondary | ICD-10-CM | POA: Diagnosis not present

## 2020-08-18 DIAGNOSIS — I480 Paroxysmal atrial fibrillation: Secondary | ICD-10-CM

## 2020-08-18 DIAGNOSIS — F409 Phobic anxiety disorder, unspecified: Secondary | ICD-10-CM | POA: Insufficient documentation

## 2020-08-18 DIAGNOSIS — R7301 Impaired fasting glucose: Secondary | ICD-10-CM | POA: Diagnosis not present

## 2020-08-18 MED ORDER — ALPRAZOLAM 0.5 MG PO TABS: 0.5000 mg | ORAL_TABLET | Freq: Every evening | ORAL | 0 refills | Status: DC | PRN

## 2020-08-18 NOTE — Progress Notes (Signed)
Established Patient Office Visit  Subjective:  Patient ID: Savannah Hill, female    DOB: 1950-05-17  Age: 70 y.o. MRN: [id number]  CC:  Chief Complaint  Patient presents with  . surgical clearance    HPI CANDA PODGORSKI presents for presurgical clearance. She is scheduled to have total replacement of her right knee. She states that, periodically, she would get cortisone injections into her right knee due to arthritic pain. In January, she had one of these injections and she states that it did not touch the pain. She talked to her orthopedic provider her scheduled her to have MRI. She was found to have a torn meniscus and severe degenerative arthritis. She was quickly referred to a surgeon and is now scheduled to have knee replacement.  She does have history of chronic a-fib, hypertension, and lymphedema of the right knee. She saw cardiologist yesterday and has been cleared from cardiac standpoint to have total knee replacement. She is to hold her eliquis for at least 3 days prior to surgery and can resume as directed by her surgeon. Today, she is to have labs drawn per orthopedic surgery orders.  She has no concerns or complaints today. She denies chest pain, chest pressure, or shortness of breath. She denies headaches or visual disturbances. She denies abdominal pain, nausea, vomiting, or changes in bowel or bladder habits.   Past Medical History:  Diagnosis Date  . Anemia    borderline  . Atrial fibrillation ([place])    a. s/p ablation on 10/05/2016  . Cancer [facility])    cervical/rad hysterectomy/bso wiht chemo for small cell ca  . Dyspnea   . Heart valve disorder   . HLD (hyperlipidemia)   . Hypertension   . Joint pain   . Lower extremity edema   . Obesity   . Palpitations     Past Surgical History:  Procedure Laterality Date  . ABLATION OF DYSRHYTHMIC FOCUS  10/05/2016  . ATRIAL FIBRILLATION ABLATION N/A 10/05/2016   Procedure: Atrial Fibrillation Ablation;  Surgeon:  Constance Haw, MD;  Location: [place] CV LAB;  Service: Cardiovascular;  Laterality: N/A;  . IR RADIOLOGY PERIPHERAL GUIDED IV START  09/28/2016  . IR US GUIDE VASC ACCESS RIGHT  09/28/2016  . RADICAL ABDOMINAL HYSTERECTOMY  1986   with BSO  . TEE WITHOUT CARDIOVERSION N/A 06/07/2016   Procedure: TRANSESOPHAGEAL ECHOCARDIOGRAM (TEE);  Surgeon: Sanda Klein, MD;  Location: [facility] ENDOSCOPY;  Service: Cardiovascular;  Laterality: N/A;    Family History  Problem Relation Age of Onset  . Diabetes Mother   . Hypertension Mother   . Lung cancer Mother   . Heart disease Mother   . Thyroid disease Mother   . Depression Mother   . Alcoholism Mother   . Skin cancer Father   . CVA Father   . Diabetes Father   . Hypertension Father   . Cancer Paternal Grandfather        unknown type    Social History   Socioeconomic History  . Marital status: Married    Spouse name: Eleanor Gatliff  . Number of children: 1  . Years of education: Not on file  . Highest education level: Not on file  Occupational History  . 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 Smoking status: Never Smoker  . Smokeless tobacco: Never Used  Vaping Use  . Vaping Use: Never used  Substance and Sexual Activity  . Alcohol use: Yes  Alcohol/week: 4.0 - 5.0 standard drinks    Types: 4 - 5 Standard drinks or equivalent per week  . Drug use: No  . Sexual activity: Yes    Partners: Male    Birth control/protection: Surgical  Other Topics Concern  . Not on file  Social History Narrative   [place]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   Married   Social Determinants of Health   Financial Resource Strain: Not on [employer] Insecurity: Not on file  Transportation Needs: Not on file  Physical Activity: Not on file  Stress: Not on file  Social Connections: Not on file  Intimate Partner Violence: Not on file    Outpatient Medications Prior to Visit   Medication Sig Dispense Refill  . apixaban (ELIQUIS) 5 MG TABS tablet Take 1 tablet (5 mg total) by mouth 2 (two) times daily. 180 tablet 2  . Cholecalciferol (VITAMIN D3) 125 MCG (5000 UT) CAPS Take 1 capsule by mouth daily.    . citalopram (CELEXA) 20 MG tablet Take 1 tablet (20 mg total) by mouth daily. 90 tablet 3  . diltiazem (TIAZAC) 120 MG 24 hr capsule TAKE 2 CAPSULES (240 MG TOTAL) BY MOUTH DAILY. TO GOAL BLOOD PRESSURE AROUND 130/80 180 capsule 1  . docusate sodium (COLACE) 100 MG capsule Take 100 mg by mouth daily.    . furosemide (LASIX) 20 MG tablet Take 1 tablet (20 mg total) by mouth daily. 90 tablet 1  . Multiple Vitamin (MULTIVITAMIN WITH MINERALS) TABS Take 1 tablet by mouth daily.    . rosuvastatin (CRESTOR) 5 MG tablet Take 1 tablet (5 mg total) by mouth at bedtime. 90 tablet 0  . traZODone (DESYREL) 100 MG tablet 1 tab po q hs prn sleep. 30 tablet 0   No facility-administered medications prior to visit.    No Known Allergies  ROS Review of Systems  Constitutional: Positive for activity change. Negative for chills, fatigue and fever.       Activity decrease due to right knee pain.   HENT: Negative for congestion and sinus pain.   Eyes: Negative.   Respiratory: Negative for cough, chest tightness, shortness of breath and wheezing.   Cardiovascular: Negative for chest pain and palpitations.  Gastrointestinal: Negative for constipation, diarrhea, nausea and vomiting.  Endocrine: Negative for cold intolerance, heat intolerance, polydipsia and polyuria.  Genitourinary: Negative.   Musculoskeletal: Positive for arthralgias and joint swelling. Negative for back pain and myalgias.       Right knee pain and swelling.   Skin: Negative for rash.  Allergic/Immunologic: Negative.   Neurological: Negative.  Negative for dizziness, weakness and headaches.  Hematological: Positive for adenopathy.       Chronic lymphedema of right leg.   Psychiatric/Behavioral: Positive for  sleep disturbance. The patient is nervous/anxious.        The patient states that she does have intermittent anxiety. She was given a prescription for alprazolam 0.60m to take as needed. She states that when needed, she will break tablet in half and take at night to help her sleep. The last prescription was given to her in 08/2018. Her fill history was verified per PDMP profile. There are still several tablets remaining in bottle.       Objective:    Physical Exam Vitals and nursing note reviewed.  Constitutional:      Appearance: Normal appearance. She is well-developed. She is obese.  HENT:     Head:  Normocephalic and atraumatic.     Nose: Nose normal.  Eyes:     Extraocular Movements: Extraocular movements intact.     Conjunctiva/sclera: Conjunctivae normal.     Pupils: Pupils are equal, round, and reactive to light.  Neck:     Vascular: No carotid bruit.  Cardiovascular:     Rate and Rhythm: Normal rate and regular rhythm.     Pulses: Normal pulses.     Heart sounds: Normal heart sounds.  Pulmonary:     Effort: Pulmonary effort is normal.     Breath sounds: Normal breath sounds.  Abdominal:     Palpations: Abdomen is soft.  Musculoskeletal:        General: Swelling present. Normal range of motion.     Cervical back: Normal range of motion and neck supple.     Comments: Right leg, especially right knee, with edema present. ROM and strength of right knee diminished due to pain.   Skin:    General: Skin is warm and dry.     Capillary Refill: Capillary refill takes 2 to 3 seconds.  Neurological:     General: No focal deficit present.     Mental Status: She is alert and oriented to person, place, and time.  Psychiatric:        Mood and Affect: Mood normal.        Behavior: Behavior normal.        Thought Content: Thought content normal.        Judgment: Judgment normal.     Today's Vitals   08/18/20 1139  BP: 116/75  Pulse: 66  Temp: 99 F (37.2 C)  SpO2: 95%   Weight: 196 lb (88.9 kg)  Height: 5' 3"  (1.6 m)   Body mass index is 34.72 kg/m.   Wt Readings from Last 3 Encounters:  08/18/20 196 lb (88.9 kg)  08/17/20 198 lb 6.4 oz (90 kg)  08/11/20 197 lb 3.2 oz (89.4 kg)     Health Maintenance Due  Topic Date Due  . COVID-19 Vaccine (3 - Pfizer risk 4-dose series) 05/30/2019    There are no preventive care reminders to display for this patient.  Lab Results  Component Value Date   TSH 4.950 (H) 06/12/2019   Lab Results  Component Value Date   WBC 8.4 06/12/2019   HGB 14.4 06/12/2019   HCT 41.9 06/12/2019   MCV 95 06/12/2019   PLT 404 06/12/2019   Lab Results  Component Value Date   NA 142 01/21/2020   K 4.6 01/21/2020   CO2 24 01/21/2020   GLUCOSE 84 01/21/2020   BUN 12 01/21/2020   CREATININE 0.95 01/21/2020   BILITOT 0.7 01/21/2020   ALKPHOS 84 01/21/2020   AST 22 01/21/2020   ALT 11 01/21/2020   PROT 7.2 01/21/2020   ALBUMIN 4.5 01/21/2020   CALCIUM 9.5 01/21/2020   GFR 51.14 (L) 02/22/2014   Lab Results  Component Value Date   CHOL 179 01/21/2020   Lab Results  Component Value Date   HDL 74 01/21/2020   Lab Results  Component Value Date   LDLCALC 88 01/21/2020   Lab Results  Component Value Date   TRIG 94 01/21/2020   Lab Results  Component Value Date   CHOLHDL 2.4 01/21/2020   Lab Results  Component Value Date   HGBA1C 5.4 06/12/2019      Assessment & Plan:  1. Encounter for preoperative examination for general surgical procedure Patient to have total replacement of  right knee on 09/02/2020. Labs drawn per orders from orthopedic surgery. Pending lab results patient is clear for surgery with mild to moderate risk doe to chronic atrial fibrillation, hypertension, lymphedema of right leg, and obesity.  - HgB A1c - CBC - Comp Met (CMET) - TSH - Protime-INR - T4, free  2. Healthcare maintenance Labs with HgbA1c drawn today.  - HgB A1c - CBC - Comp Met (CMET) - TSH - Protime-INR - T4,  free  3. PAF (paroxysmal atrial fibrillation) (HCC) patinet seen and cleared for surgery per cardiology. Instructions given regarding cessation of eliquis prior to surgery and resumption after surgery. She voiced understanding of all instructions.  - HgB A1c - CBC - Comp Met (CMET) - TSH - Protime-INR - T4, free  4. Chronic anticoagulation PT/INR drawn today per orders from orthopedic surgery.  - HgB A1c - CBC - Comp Met (CMET) - TSH - Protime-INR - T4, free  5. Essential hypertension Stable. Continue medication as prescribed  - HgB A1c - CBC - Comp Met (CMET) - TSH - Protime-INR - T4, free  6. Hyperlipidemia, unspecified hyperlipidemia type Stable.  - HgB A1c - CBC - Comp Met (CMET) - TSH - Protime-INR - T4, free  7. Insomnia due to anxiety and fear May take alprazolam 0.69m as needed and as prescribed. A new prescription was sent to her mail pharmacy today. Reviewed her PDMP profile. Overdose risk score is 110 and her fill history is appropriate.  - ALPRAZolam (XANAX) 0.5 MG tablet; Take 1 tablet (0.5 mg total) by mouth at bedtime as needed for anxiety.  Dispense: 90 tablet; Refill: 0  8. Impaired fasting glucose Check HgbA1c prior to surgery.  - HgB A1c - CBC - Comp Met (CMET) - TSH - Protime-INR - T4, free  Problem List Items Addressed This Visit      Cardiovascular and Mediastinum   Essential hypertension (Chronic)   Relevant Orders   HgB A1c   CBC   Comp Met (CMET)   TSH   Protime-INR   T4, free   PAF (paroxysmal atrial fibrillation) (HCC)   Relevant Orders   HgB A1c   CBC   Comp Met (CMET)   TSH   Protime-INR   T4, free     Endocrine   Impaired fasting glucose   Relevant Orders   HgB A1c   CBC   Comp Met (CMET)   TSH   Protime-INR   T4, free     Other   Chronic anticoagulation   Relevant Orders   HgB A1c   CBC   Comp Met (CMET)   TSH   Protime-INR   T4, free   Healthcare maintenance   Relevant Orders   HgB A1c   CBC    Comp Met (CMET)   TSH   Protime-INR   T4, free   Hyperlipidemia   Relevant Orders   HgB A1c   CBC   Comp Met (CMET)   TSH   Protime-INR   T4, free   Encounter for preoperative examination for general surgical procedure - Primary   Relevant Orders   HgB A1c   CBC   Comp Met (CMET)   TSH   Protime-INR   T4, free   Insomnia due to anxiety and fear   Relevant Medications   ALPRAZolam (XANAX) 0.5 MG tablet      Meds ordered this encounter  Medications  . ALPRAZolam (XANAX) 0.5 MG tablet    Sig: Take 1 tablet (0.5 mg  total) by mouth at bedtime as needed for anxiety.    Dispense:  90 tablet    Refill:  0    Order Specific Question:   Supervising Provider    Answer:   Beatrice Lecher D [[id number]]    Follow-up: Return for as already scheduled and as needed .    Ronnell Freshwater, NP

## 2020-08-18 NOTE — Patient Instructions (Signed)
Total Knee Replacement  Total knee replacement is a surgery to replace a bad knee joint with a man-made joint. The man-made joint is called a prosthesis. This joint may be made of plastic, metal, or ceramic parts. It replaces parts of the thigh bone, lower leg bone, and kneecap. Tell your doctor about:  Any allergies you have.  All medicines you are taking. This includes vitamins, herbs, eye drops, creams, and over-the-counter medicines.  Any problems you or family members have had with medicines that make you fall asleep (anesthetics).  Any blood disorders you have.  Any surgeries you have had.  Any medical conditions you have.  Whether you are pregnant or may be pregnant. What are the risks? Generally, this is a safe procedure. But, problems may occur, such as:  Infection.  Bleeding.  Blood clot.  Allergies from medicines.  Damage to nerves and other parts of the knee.  Problems with the knee, such as: ? Not being able to move your knee. ? A feeling that the knee is weak or unstable. ? Loosening of the new joint. ? Pain that does not go away. What happens before the procedure? Staying hydrated Follow instructions from your doctor about hydration. These may include:  Up to 2 hours before the procedure - you may continue to drink clear liquids. These include water, clear fruit juice, black coffee, and plain tea.   Eating and drinking restrictions Follow instructions from your doctor about eating and drinking. These may include:  8 hours before the procedure - stop eating heavy meals or foods. These include meat, fried foods, or fatty foods.  6 hours before the procedure - stop eating light meals or foods. These include toast or cereal.  6 hours before the procedure - stop drinking milk or drinks that contain milk.  2 hours before the procedure - stop drinking clear liquids. Medicines  Ask your doctor about changing or stopping: ? Your normal  medicines. ? Vitamins, herbs, and supplements. ? Over-the-counter medicines.  Do not take aspirin or ibuprofen unless you are told to. Tests and exams  You may have a physical exam.  You may have tests, such as: ? X-rays. ? MRI. ? CT scan. ? Bone scan.  You may have a blood or pee (urine) sample taken. Lifestyle  Keep your body and teeth clean. Germs from anywhere in your body can infect your new joint. Tell your doctor: ? If you plan to have dental care and routine cleanings. ? If you get any skin infections.  If your doctor tells you to do exercises to help your knee move better and get stronger, do them as told.  If you are overweight, work with your doctor to reach a safe weight. Extra weight can affect your knee.  Do not smoke or use any products that contain nicotine or tobacco for 4 weeks before the procedure. If you need help quitting, ask your doctor.   Surgery safety For your safety, your doctor may:  [place]ta [place] the area of surgery.  Remove hair at the surgery site.  Ask you to wash with a soap that kills germs.  Give you antibiotic medicine. General instructions  Do not shave your legs just before surgery. This will be done in the hospital if needed.  Plan to have a responsible adult take you home from the hospital or clinic.  Plan to have a responsible adult care for you for the time you are told after you leave the hospital or clinic. This  is important. ? It is best to have someone care for you for at least 4-6 weeks when you get home. What happens during the procedure?  An IV tube will be put into one of your veins.  You may be given: ? A sedative. This medicine helps you relax. ? Anesthetics. These medicines:  Numb certain areas of your body.  Make you fall asleep for surgery.  A cut (incision) will be made in your knee.  Bad parts of your knee will be taken out. The bad parts will be replaced by new, man-made parts.  Your incision will be closed.  A bandage will be placed over your incision. The procedure may vary among doctors and hospitals. What happens after the procedure?  You will be monitored until you leave the hospital or clinic. This includes checking your blood pressure, heart rate, breathing rate, and blood oxygen level.  You will be given medicines for pain.  You may keep getting fluids from your IV tube.  You will be told to move around as much as you can.  You may wear special socks called compression stockings.  Do not drive until your doctor tells you it is okay. Summary  Total knee replacement is a surgery to replace a bad knee joint with a man-made joint.  Before the procedure, follow what you are told about eating, drinking, and taking medicines.  Plan to have a responsible adult take you home from the hospital or clinic. This information is not intended to replace advice given to you by your health care provider. Make sure you discuss any questions you have with your health care provider. Document Revised: 08/25/2019 Document Reviewed: 08/25/2019 Elsevier Patient Education  [place].

## 2020-08-19 LAB — COMPREHENSIVE METABOLIC PANEL
ALT: 10 IU/L (ref 0–32)
AST: 21 IU/L (ref 0–40)
Albumin/Globulin Ratio: 1.9 (ref 1.2–2.2)
Albumin: 4.5 g/dL (ref 3.8–4.8)
Alkaline Phosphatase: 76 IU/L (ref 44–121)
BUN/Creatinine Ratio: 15 (ref 12–28)
BUN: 14 mg/dL (ref 8–27)
Bilirubin Total: 0.6 mg/dL (ref 0.0–1.2)
CO2: 22 mmol/L (ref 20–29)
Calcium: 9.8 mg/dL (ref 8.7–10.3)
Chloride: 98 mmol/L (ref 96–106)
Creatinine, Ser: 0.91 mg/dL (ref 0.57–1.00)
Globulin, Total: 2.4 g/dL (ref 1.5–4.5)
Glucose: 92 mg/dL (ref 65–99)
Potassium: 4.3 mmol/L (ref 3.5–5.2)
Sodium: 138 mmol/L (ref 134–144)
Total Protein: 6.9 g/dL (ref 6.0–8.5)
eGFR: 68 mL/min/{1.73_m2} (ref >59)

## 2020-08-19 LAB — CBC
Hematocrit: 40.7 % (ref 34.0–46.6)
Hemoglobin: 13.6 g/dL (ref 11.1–15.9)
MCH: 31.8 pg (ref 26.6–33.0)
MCHC: 33.4 g/dL (ref 31.5–35.7)
MCV: 95 fL (ref 79–97)
Platelets: 349 10*3/uL (ref 150–450)
RBC: 4.28 x10E6/uL (ref 3.77–5.28)
RDW: 12.7 % (ref 11.7–15.4)
WBC: 6.3 10*3/uL (ref 3.4–10.8)

## 2020-08-19 LAB — HEMOGLOBIN A1C
Est. average glucose Bld gHb Est-mCnc: 117 mg/dL
Hgb A1c MFr Bld: 5.7 % — ABNORMAL HIGH (ref 4.8–5.6)

## 2020-08-19 LAB — T4, FREE: Free T4: 1.13 ng/dL (ref 0.82–1.77)

## 2020-08-19 LAB — PROTIME-INR
INR: 1 (ref 0.9–1.2)
Prothrombin Time: 10.4 s (ref 9.1–12.0)

## 2020-08-19 LAB — TSH: TSH: 2.28 u[IU]/mL (ref 0.450–4.500)

## 2020-08-23 DIAGNOSIS — M1711 Unilateral primary osteoarthritis, right knee: Secondary | ICD-10-CM | POA: Diagnosis not present

## 2020-08-24 DIAGNOSIS — M1711 Unilateral primary osteoarthritis, right knee: Secondary | ICD-10-CM | POA: Diagnosis not present

## 2020-09-02 DIAGNOSIS — M1711 Unilateral primary osteoarthritis, right knee: Secondary | ICD-10-CM | POA: Diagnosis not present

## 2020-09-02 DIAGNOSIS — G8918 Other acute postprocedural pain: Secondary | ICD-10-CM | POA: Diagnosis not present

## 2020-09-02 DIAGNOSIS — M21161 Varus deformity, not elsewhere classified, right knee: Secondary | ICD-10-CM | POA: Diagnosis not present

## 2020-09-04 DIAGNOSIS — Z859 Personal history of malignant neoplasm, unspecified: Secondary | ICD-10-CM | POA: Diagnosis not present

## 2020-09-04 DIAGNOSIS — Z471 Aftercare following joint replacement surgery: Secondary | ICD-10-CM | POA: Diagnosis not present

## 2020-09-04 DIAGNOSIS — Z7901 Long term (current) use of anticoagulants: Secondary | ICD-10-CM | POA: Diagnosis not present

## 2020-09-04 DIAGNOSIS — I890 Lymphedema, not elsewhere classified: Secondary | ICD-10-CM | POA: Diagnosis not present

## 2020-09-04 DIAGNOSIS — Z96651 Presence of right artificial knee joint: Secondary | ICD-10-CM | POA: Diagnosis not present

## 2020-09-04 DIAGNOSIS — I10 Essential (primary) hypertension: Secondary | ICD-10-CM | POA: Diagnosis not present

## 2020-09-06 DIAGNOSIS — I10 Essential (primary) hypertension: Secondary | ICD-10-CM | POA: Diagnosis not present

## 2020-09-06 DIAGNOSIS — Z96651 Presence of right artificial knee joint: Secondary | ICD-10-CM | POA: Diagnosis not present

## 2020-09-06 DIAGNOSIS — I890 Lymphedema, not elsewhere classified: Secondary | ICD-10-CM | POA: Diagnosis not present

## 2020-09-06 DIAGNOSIS — Z471 Aftercare following joint replacement surgery: Secondary | ICD-10-CM | POA: Diagnosis not present

## 2020-09-06 DIAGNOSIS — Z7901 Long term (current) use of anticoagulants: Secondary | ICD-10-CM | POA: Diagnosis not present

## 2020-09-06 DIAGNOSIS — Z859 Personal history of malignant neoplasm, unspecified: Secondary | ICD-10-CM | POA: Diagnosis not present

## 2020-09-08 DIAGNOSIS — Z7901 Long term (current) use of anticoagulants: Secondary | ICD-10-CM | POA: Diagnosis not present

## 2020-09-08 DIAGNOSIS — I890 Lymphedema, not elsewhere classified: Secondary | ICD-10-CM | POA: Diagnosis not present

## 2020-09-08 DIAGNOSIS — Z859 Personal history of malignant neoplasm, unspecified: Secondary | ICD-10-CM | POA: Diagnosis not present

## 2020-09-08 DIAGNOSIS — I10 Essential (primary) hypertension: Secondary | ICD-10-CM | POA: Diagnosis not present

## 2020-09-08 DIAGNOSIS — Z96651 Presence of right artificial knee joint: Secondary | ICD-10-CM | POA: Diagnosis not present

## 2020-09-08 DIAGNOSIS — Z471 Aftercare following joint replacement surgery: Secondary | ICD-10-CM | POA: Diagnosis not present

## 2020-09-09 DIAGNOSIS — Z96651 Presence of right artificial knee joint: Secondary | ICD-10-CM | POA: Diagnosis not present

## 2020-09-09 DIAGNOSIS — Z471 Aftercare following joint replacement surgery: Secondary | ICD-10-CM | POA: Diagnosis not present

## 2020-09-09 DIAGNOSIS — Z859 Personal history of malignant neoplasm, unspecified: Secondary | ICD-10-CM | POA: Diagnosis not present

## 2020-09-09 DIAGNOSIS — I10 Essential (primary) hypertension: Secondary | ICD-10-CM | POA: Diagnosis not present

## 2020-09-09 DIAGNOSIS — Z7901 Long term (current) use of anticoagulants: Secondary | ICD-10-CM | POA: Diagnosis not present

## 2020-09-09 DIAGNOSIS — I890 Lymphedema, not elsewhere classified: Secondary | ICD-10-CM | POA: Diagnosis not present

## 2020-09-12 DIAGNOSIS — Z7901 Long term (current) use of anticoagulants: Secondary | ICD-10-CM | POA: Diagnosis not present

## 2020-09-12 DIAGNOSIS — Z96651 Presence of right artificial knee joint: Secondary | ICD-10-CM | POA: Diagnosis not present

## 2020-09-12 DIAGNOSIS — Z471 Aftercare following joint replacement surgery: Secondary | ICD-10-CM | POA: Diagnosis not present

## 2020-09-12 DIAGNOSIS — I890 Lymphedema, not elsewhere classified: Secondary | ICD-10-CM | POA: Diagnosis not present

## 2020-09-12 DIAGNOSIS — Z859 Personal history of malignant neoplasm, unspecified: Secondary | ICD-10-CM | POA: Diagnosis not present

## 2020-09-12 DIAGNOSIS — I10 Essential (primary) hypertension: Secondary | ICD-10-CM | POA: Diagnosis not present

## 2020-09-13 DIAGNOSIS — Z96651 Presence of right artificial knee joint: Secondary | ICD-10-CM | POA: Diagnosis not present

## 2020-09-13 DIAGNOSIS — R262 Difficulty in walking, not elsewhere classified: Secondary | ICD-10-CM | POA: Diagnosis not present

## 2020-09-13 DIAGNOSIS — Z471 Aftercare following joint replacement surgery: Secondary | ICD-10-CM | POA: Diagnosis not present

## 2020-09-13 DIAGNOSIS — M25661 Stiffness of right knee, not elsewhere classified: Secondary | ICD-10-CM | POA: Diagnosis not present

## 2020-09-16 ENCOUNTER — Other Ambulatory Visit: Payer: Self-pay

## 2020-09-16 ENCOUNTER — Ambulatory Visit
Admission: EM | Admit: 2020-09-16 | Discharge: 2020-09-16 | Disposition: A | Discharge status: Home / Self Care | Payer: Medicare Other | Attending: Family Medicine | Admitting: Family Medicine

## 2020-09-16 ENCOUNTER — Encounter: Payer: Self-pay | Admitting: Emergency Medicine

## 2020-09-16 DIAGNOSIS — U071 COVID-19: Secondary | ICD-10-CM

## 2020-09-16 DIAGNOSIS — J069 Acute upper respiratory infection, unspecified: Secondary | ICD-10-CM

## 2020-09-16 DIAGNOSIS — R197 Diarrhea, unspecified: Secondary | ICD-10-CM

## 2020-09-16 DIAGNOSIS — Z20822 Contact with and (suspected) exposure to covid-19: Secondary | ICD-10-CM | POA: Diagnosis not present

## 2020-09-16 MED ORDER — MOLNUPIRAVIR EUA 200MG CAPSULE: 4.0000 | ORAL_CAPSULE | Freq: Two times a day (BID) | ORAL | 0 refills | Status: AC

## 2020-09-16 NOTE — ED Triage Notes (Signed)
Patient c/o diarrhea, fever, and nonproductive cough x 2 days.   Patient endorses a temperature of 100 F at home.   Patient endorses taking an at home COVID test with "uncertain" results.   Patient endorses 3 episodes of diarrhea in the past 24 hours.   Patient endorses fatigue and generalized body aches.   Patient denies SOB or Chest Pain.   Patient took Tylenol at home with some relief of symptoms.

## 2020-09-16 NOTE — ED Provider Notes (Signed)
[facility]   [id number] 09/16/20 Arrival Time: 4098   CC: COVID symptoms  SUBJECTIVE: History from: patient.  Savannah Hill is a 70 y.o. female who presents with diarrhea, fever, fatigue, body aches, SOB, chest pain for the last 2 days. Reports that her husband has Covid as well. Denies recent travel. Has negative history of Covid. Has completed Covid vaccines. Has not taken OTC medications for this. There are no aggravating or alleviating factors. Denies previous symptoms in the past. Denies sinus pain, rhinorrhea, wheezing, chest pain, nausea, changes in bowel or bladder habits.    ROS: As per HPI.  All other pertinent ROS negative.     Past Medical History:  Diagnosis Date   Anemia    borderline   Atrial fibrillation ([place])    a. s/p ablation on 10/05/2016   Cancer [facility])    cervical/rad hysterectomy/bso wiht chemo for small cell ca   Dyspnea    Heart valve disorder    HLD (hyperlipidemia)    Hypertension    Joint pain    Lower extremity edema    Obesity    Palpitations    Past Surgical History:  Procedure Laterality Date   ABLATION OF DYSRHYTHMIC FOCUS  10/05/2016   ATRIAL FIBRILLATION ABLATION N/A 10/05/2016   Procedure: Atrial Fibrillation Ablation;  Surgeon: Constance Haw, MD;  Location: [place] CV LAB;  Service: Cardiovascular;  Laterality: N/A;   IR RADIOLOGY PERIPHERAL GUIDED IV START  09/28/2016   IR US GUIDE VASC ACCESS RIGHT  09/28/2016   RADICAL ABDOMINAL HYSTERECTOMY  1986   with BSO   REPLACEMENT TOTAL KNEE Right 2022   TEE WITHOUT CARDIOVERSION N/A 06/07/2016   Procedure: TRANSESOPHAGEAL ECHOCARDIOGRAM (TEE);  Surgeon: Sanda Klein, MD;  Location: [facility] ENDOSCOPY;  Service: Cardiovascular;  Laterality: N/A;   No Known Allergies No current facility-administered medications on file prior to encounter.   Current Outpatient Medications on File Prior to Encounter  Medication Sig Dispense Refill   apixaban (ELIQUIS) 5 MG TABS tablet  Take 1 tablet (5 mg total) by mouth 2 (two) times daily. (Patient taking differently: Take 5 mg by mouth 2 (two) times daily. " I'm taking 2.5 currently due to my knee replacement surgery".) 180 tablet 2   citalopram (CELEXA) 20 MG tablet Take 1 tablet (20 mg total) by mouth daily. 90 tablet 3   diltiazem (TIAZAC) 120 MG 24 hr capsule TAKE 2 CAPSULES (240 MG TOTAL) BY MOUTH DAILY. TO GOAL BLOOD PRESSURE AROUND 130/80 180 capsule 1   rosuvastatin (CRESTOR) 5 MG tablet Take 1 tablet (5 mg total) by mouth at bedtime. 90 tablet 0   ALPRAZolam (XANAX) 0.5 MG tablet Take 1 tablet (0.5 mg total) by mouth at bedtime as needed for anxiety. 90 tablet 0   Cholecalciferol (VITAMIN D3) 125 MCG (5000 UT) CAPS Take 1 capsule by mouth daily.     docusate sodium (COLACE) 100 MG capsule Take 100 mg by mouth daily.     furosemide (LASIX) 20 MG tablet Take 1 tablet (20 mg total) by mouth daily. 90 tablet 1   Multiple Vitamin (MULTIVITAMIN WITH MINERALS) TABS Take 1 tablet by mouth daily.     traZODone (DESYREL) 100 MG tablet 1 tab po q hs prn sleep. 30 tablet 0   Social History   Socioeconomic History   Marital status: Married    Spouse name: Mystique Bjelland   Number of children: 1   Years of education: Not on file   Highest education level:  Not on file  Occupational History   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, [employer]  Tobacco Use   Smoking status: Never   Smokeless tobacco: Never  Vaping Use   Vaping Use: Never used  Substance and Sexual Activity   Alcohol use: Yes    Alcohol/week: 4.0 - 5.0 standard drinks    Types: 4 - 5 Standard drinks or equivalent per week   Drug use: No   Sexual activity: Yes    Partners: Male    Birth control/protection: Surgical  Other Topics Concern   Not on file  Social History Narrative   [place] of [place], [place]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   Married   Social Determinants of Health   Financial Resource Strain: Not on file  Food  Insecurity: Not on file  Transportation Needs: Not on file  Physical Activity: Not on file  Stress: Not on file  Social Connections: Not on file  Intimate Partner Violence: Not on file   Family History  Problem Relation Age of Onset   Diabetes Mother    Hypertension Mother    Lung cancer Mother    Heart disease Mother    Thyroid disease Mother    Depression Mother    Alcoholism Mother    Skin cancer Father    CVA Father    Diabetes Father    Hypertension Father    Cancer Paternal Grandfather        unknown type    OBJECTIVE:  Vitals:   09/16/20 0821  BP: 120/81  Pulse: 76  Resp: 17  Temp: 99.3 F (37.4 C)  TempSrc: Oral  SpO2: 95%     General appearance: alert; appears fatigued, but nontoxic; speaking in full sentences and tolerating own secretions HEENT: NCAT; Ears: EACs clear, TMs pearly gray; Eyes: PERRL.  EOM grossly intact. Sinuses: nontender; Nose: nares patent with clear rhinorrhea, Throat: oropharynx erythematous, cobblestoning present, tonsils non erythematous or enlarged, uvula midline  Neck: supple without LAD Lungs: unlabored respirations, symmetrical air entry; cough: mild; no respiratory distress; CTAB Heart: regular rate and rhythm.  Radial pulses 2+ symmetrical bilaterally Skin: warm and dry Psychological: alert and cooperative; normal mood and affect  LABS:  No results found for this or any previous visit (from the past 24 hour(s)).   ASSESSMENT & PLAN:  1. COVID-19   2. Viral URI with cough   3. Diarrhea, unspecified type     Meds ordered this encounter  Medications   molnupiravir EUA 200 mg CAPS    Sig: Take 4 capsules (800 mg total) by mouth 2 (two) times daily for 5 days.    Dispense:  40 capsule    Refill:  0    Order Specific Question:   Supervising Provider    Answer:   Chase Picket [id number]    Paper rx given for molnuiravir Take as directed and to completion Continue supportive care at home COVID and flu testing  ordered. It will take between 2-3 days for test results. Someone will contact you regarding abnormal results.   Patient should remain in quarantine until they have received Covid results.  If negative you may resume normal activities (go back to work/school) while practicing hand hygiene, social distance, and mask wearing.  If positive, patient should remain in quarantine for at least 5 days from symptom onset AND greater than 72 hours after symptoms resolution (absence of fever without the use of fever-reducing medication and improvement in respiratory  symptoms), whichever is longer Get plenty of rest and push fluids Use OTC zyrtec for nasal congestion, runny nose, and/or sore throat Use OTC flonase for nasal congestion and runny nose Use medications daily for symptom relief Use OTC medications like ibuprofen or tylenol as needed fever or pain Call or go to the ED if you have any new or worsening symptoms such as fever, worsening cough, shortness of breath, chest tightness, chest pain, turning blue, changes in mental status.  Reviewed expectations re: course of current medical issues. Questions answered. Outlined signs and symptoms indicating need for more acute intervention. Patient verbalized understanding. After Visit Summary given.          Faustino Congress, NP 09/16/20 [phone]

## 2020-09-16 NOTE — Discharge Instructions (Addendum)
I have given you a paper prescription for molnupiravir to take 4 capsules twice a day for 5 days.  Follow up with this office or with primary care if symptoms are persisting.  Follow up in the ER for high fever, trouble swallowing, trouble breathing, other concerning symptoms.

## 2020-09-17 LAB — NOVEL CORONAVIRUS, NAA: SARS-CoV-2, NAA: DETECTED — AB

## 2020-09-17 LAB — SARS-COV-2, NAA 2 DAY TAT

## 2020-09-21 DIAGNOSIS — Z96651 Presence of right artificial knee joint: Secondary | ICD-10-CM | POA: Diagnosis not present

## 2020-09-21 DIAGNOSIS — M25661 Stiffness of right knee, not elsewhere classified: Secondary | ICD-10-CM | POA: Diagnosis not present

## 2020-09-21 DIAGNOSIS — R262 Difficulty in walking, not elsewhere classified: Secondary | ICD-10-CM | POA: Diagnosis not present

## 2020-09-23 DIAGNOSIS — M25661 Stiffness of right knee, not elsewhere classified: Secondary | ICD-10-CM | POA: Diagnosis not present

## 2020-09-23 DIAGNOSIS — Z96651 Presence of right artificial knee joint: Secondary | ICD-10-CM | POA: Diagnosis not present

## 2020-09-23 DIAGNOSIS — R262 Difficulty in walking, not elsewhere classified: Secondary | ICD-10-CM | POA: Diagnosis not present

## 2020-09-27 DIAGNOSIS — R262 Difficulty in walking, not elsewhere classified: Secondary | ICD-10-CM | POA: Diagnosis not present

## 2020-09-27 DIAGNOSIS — M25661 Stiffness of right knee, not elsewhere classified: Secondary | ICD-10-CM | POA: Diagnosis not present

## 2020-09-27 DIAGNOSIS — Z96651 Presence of right artificial knee joint: Secondary | ICD-10-CM | POA: Diagnosis not present

## 2020-09-29 DIAGNOSIS — Z96651 Presence of right artificial knee joint: Secondary | ICD-10-CM | POA: Diagnosis not present

## 2020-09-29 DIAGNOSIS — M25661 Stiffness of right knee, not elsewhere classified: Secondary | ICD-10-CM | POA: Diagnosis not present

## 2020-09-29 DIAGNOSIS — R262 Difficulty in walking, not elsewhere classified: Secondary | ICD-10-CM | POA: Diagnosis not present

## 2020-10-04 DIAGNOSIS — M25661 Stiffness of right knee, not elsewhere classified: Secondary | ICD-10-CM | POA: Diagnosis not present

## 2020-10-04 DIAGNOSIS — Z96651 Presence of right artificial knee joint: Secondary | ICD-10-CM | POA: Diagnosis not present

## 2020-10-04 DIAGNOSIS — R262 Difficulty in walking, not elsewhere classified: Secondary | ICD-10-CM | POA: Diagnosis not present

## 2020-10-06 DIAGNOSIS — Z96651 Presence of right artificial knee joint: Secondary | ICD-10-CM | POA: Diagnosis not present

## 2020-10-06 DIAGNOSIS — M25661 Stiffness of right knee, not elsewhere classified: Secondary | ICD-10-CM | POA: Diagnosis not present

## 2020-10-06 DIAGNOSIS — R262 Difficulty in walking, not elsewhere classified: Secondary | ICD-10-CM | POA: Diagnosis not present

## 2020-10-10 DIAGNOSIS — M25661 Stiffness of right knee, not elsewhere classified: Secondary | ICD-10-CM | POA: Diagnosis not present

## 2020-10-10 DIAGNOSIS — R262 Difficulty in walking, not elsewhere classified: Secondary | ICD-10-CM | POA: Diagnosis not present

## 2020-10-10 DIAGNOSIS — Z96651 Presence of right artificial knee joint: Secondary | ICD-10-CM | POA: Diagnosis not present

## 2020-10-12 DIAGNOSIS — Z96651 Presence of right artificial knee joint: Secondary | ICD-10-CM | POA: Diagnosis not present

## 2020-10-12 DIAGNOSIS — R262 Difficulty in walking, not elsewhere classified: Secondary | ICD-10-CM | POA: Diagnosis not present

## 2020-10-12 DIAGNOSIS — M25611 Stiffness of right shoulder, not elsewhere classified: Secondary | ICD-10-CM | POA: Diagnosis not present

## 2020-10-17 DIAGNOSIS — Z96651 Presence of right artificial knee joint: Secondary | ICD-10-CM | POA: Diagnosis not present

## 2020-10-17 DIAGNOSIS — R262 Difficulty in walking, not elsewhere classified: Secondary | ICD-10-CM | POA: Diagnosis not present

## 2020-10-17 DIAGNOSIS — M25661 Stiffness of right knee, not elsewhere classified: Secondary | ICD-10-CM | POA: Diagnosis not present

## 2020-10-19 DIAGNOSIS — M25661 Stiffness of right knee, not elsewhere classified: Secondary | ICD-10-CM | POA: Diagnosis not present

## 2020-10-19 DIAGNOSIS — Z96651 Presence of right artificial knee joint: Secondary | ICD-10-CM | POA: Diagnosis not present

## 2020-10-19 DIAGNOSIS — R262 Difficulty in walking, not elsewhere classified: Secondary | ICD-10-CM | POA: Diagnosis not present

## 2020-10-24 DIAGNOSIS — M25661 Stiffness of right knee, not elsewhere classified: Secondary | ICD-10-CM | POA: Diagnosis not present

## 2020-10-24 DIAGNOSIS — R262 Difficulty in walking, not elsewhere classified: Secondary | ICD-10-CM | POA: Diagnosis not present

## 2020-10-24 DIAGNOSIS — Z96651 Presence of right artificial knee joint: Secondary | ICD-10-CM | POA: Diagnosis not present

## 2020-10-26 DIAGNOSIS — M25661 Stiffness of right knee, not elsewhere classified: Secondary | ICD-10-CM | POA: Diagnosis not present

## 2020-10-26 DIAGNOSIS — Z96651 Presence of right artificial knee joint: Secondary | ICD-10-CM | POA: Diagnosis not present

## 2020-10-26 DIAGNOSIS — R262 Difficulty in walking, not elsewhere classified: Secondary | ICD-10-CM | POA: Diagnosis not present

## 2020-11-03 ENCOUNTER — Other Ambulatory Visit: Payer: Self-pay | Admitting: Obstetrics & Gynecology

## 2020-11-03 NOTE — Telephone Encounter (Signed)
Pt scheduled 02/17/21 for visit with Dr Keturah Barre CMA

## 2020-11-08 ENCOUNTER — Other Ambulatory Visit: Payer: Self-pay | Admitting: Cardiology

## 2020-11-08 DIAGNOSIS — I480 Paroxysmal atrial fibrillation: Secondary | ICD-10-CM

## 2020-11-08 NOTE — Telephone Encounter (Signed)
Eliquis '5mg'$  refill request received. Patient is 70 years old, weight-88.9kg, Crea-0.91 on 08/18/2020, Diagnosis-Afib, and last seen by Tommye Standard on 08/17/2020. Dose is appropriate based on dosing criteria. Will send in refill to requested pharmacy.

## 2020-11-15 ENCOUNTER — Other Ambulatory Visit: Payer: Self-pay | Admitting: Physician Assistant

## 2020-11-15 DIAGNOSIS — F5101 Primary insomnia: Secondary | ICD-10-CM

## 2020-11-17 ENCOUNTER — Encounter: Payer: Self-pay | Admitting: Cardiology

## 2020-11-17 ENCOUNTER — Other Ambulatory Visit: Payer: Self-pay

## 2020-11-17 ENCOUNTER — Ambulatory Visit (INDEPENDENT_AMBULATORY_CARE_PROVIDER_SITE_OTHER): Payer: Medicare Other | Admitting: Cardiology

## 2020-11-17 VITALS — BP 130/72 | HR 74 | Ht 63.0 in | Wt 186.8 lb

## 2020-11-17 DIAGNOSIS — I10 Essential (primary) hypertension: Secondary | ICD-10-CM | POA: Diagnosis not present

## 2020-11-17 DIAGNOSIS — I340 Nonrheumatic mitral (valve) insufficiency: Secondary | ICD-10-CM

## 2020-11-17 DIAGNOSIS — I480 Paroxysmal atrial fibrillation: Secondary | ICD-10-CM

## 2020-11-17 NOTE — Progress Notes (Signed)
Electrophysiology Office Note   Date:  11/17/2020   ID:  Savannah Hill, DOB 1950-05-02, MRN [id number]  PCP:  Lorrene Reid, PA-C  Cardiologist:  Marlou Porch Primary Electrophysiologist:  Constance Haw, MD    No chief complaint on file.    History of Present Illness: Savannah Hill is a 70 y.o. female who presents today for electrophysiology evaluation.     She has a history of atrial fibrillation, hypertension, PVCs.  Her atrial fibrillation was found after hospitalization in 2013.  She is status post ablation 10/05/2016.  She also has a moderate mitral regurgitation.  Today, denies symptoms of palpitations, chest pain, shortness of breath, orthopnea, PND, lower extremity edema, claudication, dizziness, presyncope, syncope, bleeding, or neurologic sequela. The patient is tolerating medications without difficulties.  She currently feels well.  She has no chest pain or shortness of breath.  She recently had a right knee replacement.  She has significant swelling in her right leg.  She has follow-up with orthopedic surgery in 2 weeks.  Past Medical History:  Diagnosis Date   Anemia    borderline   Atrial fibrillation ([place])    a. s/p ablation on 10/05/2016   Cancer [facility])    cervical/rad hysterectomy/bso wiht chemo for small cell ca   Dyspnea    Heart valve disorder    HLD (hyperlipidemia)    Hypertension    Joint pain    Lower extremity edema    Obesity    Palpitations    Past Surgical History:  Procedure Laterality Date   ABLATION OF DYSRHYTHMIC FOCUS  10/05/2016   ATRIAL FIBRILLATION ABLATION N/A 10/05/2016   Procedure: Atrial Fibrillation Ablation;  Surgeon: Constance Haw, MD;  Location: [place] CV LAB;  Service: Cardiovascular;  Laterality: N/A;   IR RADIOLOGY PERIPHERAL GUIDED IV START  09/28/2016   IR US GUIDE VASC ACCESS RIGHT  09/28/2016   RADICAL ABDOMINAL HYSTERECTOMY  1986   with BSO   REPLACEMENT TOTAL KNEE Right 2022   TEE WITHOUT  CARDIOVERSION N/A 06/07/2016   Procedure: TRANSESOPHAGEAL ECHOCARDIOGRAM (TEE);  Surgeon: Sanda Klein, MD;  Location: [facility] ENDOSCOPY;  Service: Cardiovascular;  Laterality: N/A;     Current Outpatient Medications  Medication Sig Dispense Refill   ALPRAZolam (XANAX) 0.5 MG tablet Take 1 tablet (0.5 mg total) by mouth at bedtime as needed for anxiety. 90 tablet 0   Cholecalciferol (VITAMIN D3) 125 MCG (5000 UT) CAPS Take 1 capsule by mouth daily.     citalopram (CELEXA) 20 MG tablet TAKE 1 TABLET DAILY 90 tablet 1   diltiazem (TIAZAC) 120 MG 24 hr capsule TAKE 2 CAPSULES (240 MG TOTAL) BY MOUTH DAILY. TO GOAL BLOOD PRESSURE AROUND 130/80 180 capsule 1   docusate sodium (COLACE) 100 MG capsule Take 100 mg by mouth daily.     furosemide (LASIX) 20 MG tablet Take 1 tablet (20 mg total) by mouth daily. 90 tablet 1   Multiple Vitamin (MULTIVITAMIN WITH MINERALS) TABS Take 1 tablet by mouth daily.     rosuvastatin (CRESTOR) 5 MG tablet Take 1 tablet (5 mg total) by mouth at bedtime. 90 tablet 0   traZODone (DESYREL) 100 MG tablet TAKE 1 TABLET AT BEDTIME ASNEEDED FOR SLEEP 30 tablet 0   No current facility-administered medications for this visit.    Allergies:   Patient has no known allergies.   Social History:  The patient  reports that she has never smoked. She has never used smokeless tobacco. She reports current alcohol  use of about 4.0 - 5.0 standard drinks per week. She reports that she does not use drugs.   Family History:  The patient's family history includes Alcoholism in her mother; CVA in her father; Cancer in her paternal grandfather; Depression in her mother; Diabetes in her father and mother; Heart disease in her mother; Hypertension in her father and mother; Lung cancer in her mother; Skin cancer in her father; Thyroid disease in her mother.   ROS:  Please see the history of present illness.   Otherwise, review of systems is positive for none.   All other systems are reviewed and  negative.   PHYSICAL EXAM: VS:  BP 130/72   Pulse 74   Ht '5\' 3"'$  (1.6 m)   Wt 186 lb 12.8 oz (84.7 kg)   LMP 03/19/1984   SpO2 98%   BMI 33.09 kg/m  , BMI Body mass index is 33.09 kg/m. GEN: Well nourished, well developed, in no acute distress  HEENT: normal  Neck: no JVD, carotid bruits, or masses Cardiac: RRR; no murmurs, rubs, or gallops,no edema  Respiratory:  clear to auscultation bilaterally, normal work of breathing GI: soft, nontender, nondistended, + BS MS: no deformity or atrophy  Skin: warm and dry Neuro:  Strength and sensation are intact Psych: euthymic mood, full affect  EKG:  EKG is ordered today. Personal review of the ekg ordered shows sinus rhythm, rate 74, first-degree AV block  Recent Labs: 08/18/2020: ALT 10; BUN 14; Creatinine, Ser 0.91; Hemoglobin 13.6; Platelets 349; Potassium 4.3; Sodium 138; TSH 2.280    Lipid Panel     Component Value Date/Time   CHOL 179 01/21/2020 0954   TRIG 94 01/21/2020 0954   HDL 74 01/21/2020 0954   CHOLHDL 2.4 01/21/2020 0954   CHOLHDL 4.8 06/03/2015 1403   VLDL 21 06/03/2015 1403   LDLCALC 88 01/21/2020 0954     Wt Readings from Last 3 Encounters:  11/17/20 186 lb 12.8 oz (84.7 kg)  08/18/20 196 lb (88.9 kg)  08/17/20 198 lb 6.4 oz (90 kg)      Other studies Reviewed: Additional studies/ records that were reviewed today include: TTE3/22/18 Review of the above records today demonstrates:   - Left ventricle: Systolic function was normal. The estimated   ejection fraction was in the range of 55% to 60%. Wall motion was   normal; there were no regional wall motion abnormalities. There   was a reduced contribution of atrial contraction to ventricular   filling, due to increased ventricular diastolic pressure or   atrial contractile dysfunction. Features are consistent with a   pseudonormal left ventricular filling pattern, with concomitant   abnormal relaxation and increased filling pressure (grade 2    diastolic dysfunction). - Aortic valve: No evidence of vegetation. - Mitral valve: Moderately dilated annulus. Structurally normal   valve. There was moderate regurgitation directed centrally. - Left atrium: The atrium was mildly to moderately dilated. No   evidence of thrombus in the atrial cavity or appendage. - Right atrium: No evidence of thrombus in the atrial cavity or   appendage. No evidence of thrombus in the atrial cavity or   appendage. - Atrial septum: No defect or patent foramen ovale was identified.   There was no right-to-left atrial level shunt, following an   increase in RA pressure induced by provocative maneuvers. - Pulmonic valve: No evidence of vegetation. - Pulmonary arteries: Systolic pressure was mildly increased. PA   peak pressure: 40 mm Hg (S). - Pericardium,  extracardiac: A trivial pericardial effusion was   identified.  SPECT 06/15/16 Nuclear stress EF: 58%. There was no ST segment deviation noted during stress. The study is normal. This is a low risk study. The left ventricular ejection fraction is hyperdynamic (>65%).   Normal pharmacologic nuclear stress test with no evidence of prior infarct or ischemia.   ASSESSMENT AND PLAN:  1.  Paroxysmal atrial fibrillation/flutter: Status post ablation 10/05/2016.  Currently on Eliquis.  CHA2DS2-VASc of 3.  She has had no further episodes of atrial fibrillation since her ablation.  She is overall doing well.  She has not had any further episodes, Desmin Daleo stop Eliquis.  She Germani Gavilanes restart the medication should she go back into atrial fibrillation.  2.  PVCs: Minimally symptomatic.  No changes.  3.  Hypertension: Currently well controlled  4.  Moderate mitral regurgitation: We Braidyn Scorsone plan for repeat echo today.   Current medicines are reviewed at length with the patient today.   The patient does not have concerns regarding her medicines.  The following changes were made today: Stop Eliquis  Labs/ tests ordered  today include:  Orders Placed This Encounter  Procedures   EKG 12-Lead   ECHOCARDIOGRAM COMPLETE      Disposition:   FU with Javonna Balli 12 months  Signed, Kathia Covington Meredith Leeds, MD  11/17/2020 3:38 PM     [address] [place] [address] [phone] (office) [phone] (fax)

## 2020-11-17 NOTE — Patient Instructions (Signed)
Medication Instructions:  Your physician has recommended you make the following change in your medication:  STOP Eliquis (Apixaban)  *If you need a refill on your cardiac medications before your next appointment, please call your pharmacy*   Lab Work: None Ordered If you have labs (blood work) drawn today and your tests are completely normal, you will receive your results only by: [place] (if you have MyChart) OR A paper copy in the mail If you have any lab test that is abnormal or we need to change your treatment, we will call you to review the results.   Testing/Procedures: Your physician has requested that you have an echocardiogram. Echocardiography is a painless test that uses sound waves to create images of your heart. It provides your doctor with information about the size and shape of your heart and how well your heart's chambers and valves are working. This procedure takes approximately one hour. There are no restrictions for this procedure.   Follow-Up: At [facility], you and your health needs are our priority.  As part of our continuing mission to provide you with exceptional heart care, we have created designated Provider Care Teams.  These Care Teams include your primary Cardiologist (physician) and Advanced Practice Providers (APPs -  Physician Assistants and Nurse Practitioners) who all work together to provide you with the care you need, when you need it.  Your next appointment:   1 year(s)  The format for your next appointment:   In Person  Provider:   You may see Allegra Lai, MD or one of the following Advanced Practice Providers on your designated Care Team:   Tommye Standard, [place] [facility] [place], [place]

## 2020-12-08 ENCOUNTER — Other Ambulatory Visit (HOSPITAL_COMMUNITY): Payer: Medicare Other

## 2020-12-19 DIAGNOSIS — Z1231 Encounter for screening mammogram for malignant neoplasm of breast: Secondary | ICD-10-CM | POA: Diagnosis not present

## 2020-12-22 ENCOUNTER — Other Ambulatory Visit: Payer: Self-pay

## 2020-12-22 ENCOUNTER — Other Ambulatory Visit (HOSPITAL_COMMUNITY): Payer: Medicare Other

## 2020-12-22 ENCOUNTER — Encounter (HOSPITAL_BASED_OUTPATIENT_CLINIC_OR_DEPARTMENT_OTHER): Payer: Self-pay | Admitting: Obstetrics & Gynecology

## 2020-12-22 ENCOUNTER — Ambulatory Visit (HOSPITAL_COMMUNITY): Payer: Medicare Other | Attending: Cardiology

## 2020-12-22 ENCOUNTER — Other Ambulatory Visit: Payer: Self-pay | Admitting: Physician Assistant

## 2020-12-22 DIAGNOSIS — I340 Nonrheumatic mitral (valve) insufficiency: Secondary | ICD-10-CM

## 2020-12-22 DIAGNOSIS — I480 Paroxysmal atrial fibrillation: Secondary | ICD-10-CM | POA: Diagnosis not present

## 2020-12-22 DIAGNOSIS — F5101 Primary insomnia: Secondary | ICD-10-CM

## 2020-12-22 LAB — ECHOCARDIOGRAM COMPLETE
Area-P 1/2: 3.42 cm2
MV M vel: 4.99 m/s
MV Peak grad: 99.6 mmHg
Radius: 0.5 cm
S' Lateral: 3.1 cm

## 2020-12-28 DIAGNOSIS — Z85828 Personal history of other malignant neoplasm of skin: Secondary | ICD-10-CM | POA: Diagnosis not present

## 2020-12-28 DIAGNOSIS — D225 Melanocytic nevi of trunk: Secondary | ICD-10-CM | POA: Diagnosis not present

## 2020-12-28 DIAGNOSIS — C44712 Basal cell carcinoma of skin of right lower limb, including hip: Secondary | ICD-10-CM | POA: Diagnosis not present

## 2020-12-28 DIAGNOSIS — I890 Lymphedema, not elsewhere classified: Secondary | ICD-10-CM | POA: Diagnosis not present

## 2020-12-28 DIAGNOSIS — L309 Dermatitis, unspecified: Secondary | ICD-10-CM | POA: Diagnosis not present

## 2020-12-28 DIAGNOSIS — D485 Neoplasm of uncertain behavior of skin: Secondary | ICD-10-CM | POA: Diagnosis not present

## 2020-12-28 DIAGNOSIS — L814 Other melanin hyperpigmentation: Secondary | ICD-10-CM | POA: Diagnosis not present

## 2020-12-28 DIAGNOSIS — L578 Other skin changes due to chronic exposure to nonionizing radiation: Secondary | ICD-10-CM | POA: Diagnosis not present

## 2020-12-28 DIAGNOSIS — L821 Other seborrheic keratosis: Secondary | ICD-10-CM | POA: Diagnosis not present

## 2021-01-02 ENCOUNTER — Other Ambulatory Visit (HOSPITAL_COMMUNITY): Payer: Medicare Other

## 2021-01-11 DIAGNOSIS — Z23 Encounter for immunization: Secondary | ICD-10-CM | POA: Diagnosis not present

## 2021-01-30 ENCOUNTER — Other Ambulatory Visit: Payer: Self-pay

## 2021-01-30 ENCOUNTER — Ambulatory Visit (INDEPENDENT_AMBULATORY_CARE_PROVIDER_SITE_OTHER): Payer: Medicare Other | Admitting: Physician Assistant

## 2021-01-30 ENCOUNTER — Encounter: Payer: Self-pay | Admitting: Physician Assistant

## 2021-01-30 VITALS — BP 129/82 | HR 66 | Temp 98.3°F | Ht 63.0 in | Wt 189.0 lb

## 2021-01-30 DIAGNOSIS — I10 Essential (primary) hypertension: Secondary | ICD-10-CM | POA: Diagnosis not present

## 2021-01-30 DIAGNOSIS — F409 Phobic anxiety disorder, unspecified: Secondary | ICD-10-CM | POA: Diagnosis not present

## 2021-01-30 DIAGNOSIS — R7301 Impaired fasting glucose: Secondary | ICD-10-CM

## 2021-01-30 DIAGNOSIS — E785 Hyperlipidemia, unspecified: Secondary | ICD-10-CM

## 2021-01-30 DIAGNOSIS — I890 Lymphedema, not elsewhere classified: Secondary | ICD-10-CM

## 2021-01-30 DIAGNOSIS — F5105 Insomnia due to other mental disorder: Secondary | ICD-10-CM

## 2021-01-30 DIAGNOSIS — I480 Paroxysmal atrial fibrillation: Secondary | ICD-10-CM

## 2021-01-30 DIAGNOSIS — Z0000 Encounter for general adult medical examination without abnormal findings: Secondary | ICD-10-CM

## 2021-01-30 NOTE — Assessment & Plan Note (Signed)
-  Followed by cardiology. -Reviewed recent consult. Anticoagulant was discontinued.

## 2021-01-30 NOTE — Assessment & Plan Note (Signed)
-  Stable. -Continue current medication regimen.  -Will continue to monitor. 

## 2021-01-30 NOTE — Assessment & Plan Note (Signed)
-  Stable. -Continue current medication regimen. -Will collect CMP for medication monitoring with lab visit.

## 2021-01-30 NOTE — Assessment & Plan Note (Signed)
-  Last lipid panel wnl's, LDL 88. -Recommend to continue current medication regimen. -Will collect lipid panel and hepatic function with lab visit.

## 2021-01-30 NOTE — Progress Notes (Signed)
Established Patient Office Visit  Subjective:  Patient ID: Savannah Hill, female    DOB: 09-06-1950  Age: 70 y.o. MRN: [id number]  CC:  Chief Complaint  Patient presents with   Follow-up   Hypertension   Hyperlipidemia    HPI Savannah Hill presents for follow up on hypertension and hyperlipidemia. Has no acute concerns.  HTN: Pt denies chest pain, palpitations, dizziness, shortness of breath. Patient has chronic lymphedema and states since her right knee replacement has been experiencing more swelling of right lower extremity. Taking medications as directed without side effects.   HLD: Pt taking medication as directed without issues. Reports has been exercising more. Walks a mile on the treadmill and does upper body workout.  Insomnia: Takes trazodone 50 mg almost every night which helps with sleep. On average sleeps 7-8 hours.    Past Medical History:  Diagnosis Date   Anemia    borderline   Atrial fibrillation ([place])    a. s/p ablation on 10/05/2016   Cancer [facility])    cervical/rad hysterectomy/bso wiht chemo for small cell ca   Dyspnea    Heart valve disorder    HLD (hyperlipidemia)    Hypertension    Joint pain    Lower extremity edema    Obesity    Palpitations     Past Surgical History:  Procedure Laterality Date   ABLATION OF DYSRHYTHMIC FOCUS  10/05/2016   ATRIAL FIBRILLATION ABLATION N/A 10/05/2016   Procedure: Atrial Fibrillation Ablation;  Surgeon: Constance Haw, MD;  Location: [place] [facility];  Service: Cardiovascular;  Laterality: N/A;   IR RADIOLOGY PERIPHERAL GUIDED IV START  09/28/2016   IR US GUIDE VASC ACCESS RIGHT  09/28/2016   RADICAL ABDOMINAL HYSTERECTOMY  1986   with BSO   REPLACEMENT TOTAL KNEE Right 2022   TEE WITHOUT CARDIOVERSION N/A 06/07/2016   Procedure: TRANSESOPHAGEAL ECHOCARDIOGRAM (TEE);  Surgeon: Sanda Klein, MD;  Location: [facility] ENDOSCOPY;  Service: Cardiovascular;  Laterality: N/A;    Family History   Problem Relation Age of Onset   Diabetes Mother    Hypertension Mother    Lung cancer Mother    Heart disease Mother    Thyroid disease Mother    Depression Mother    Alcoholism Mother    Skin cancer Father    CVA Father    Diabetes Father    Hypertension Father    Cancer Paternal Grandfather        unknown type    Social History   Socioeconomic History   Marital status: Married    Spouse name: Maloni Musleh   Number of children: 1   Years of education: Not on file   Highest education level: Not on file  Occupational History   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, [employer]  Tobacco Use   Smoking status: Never   Smokeless tobacco: Never  Vaping Use   Vaping Use: Never used  Substance and Sexual Activity   Alcohol use: Yes    Alcohol/week: 4.0 - 5.0 standard drinks    Types: 4 - 5 Standard drinks or equivalent per week   Drug use: No   Sexual activity: Yes    Partners: Male    Birth control/protection: Surgical  Other Topics Concern   Not on file  Social History Narrative   [place] of [place]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   Married   Social Determinants of Health   Financial Resource Strain:  Not on file  Food Insecurity: Not on file  Transportation Needs: Not on file  Physical Activity: Not on file  Stress: Not on file  Social Connections: Not on file  Intimate Partner Violence: Not on file    Outpatient Medications Prior to Visit  Medication Sig Dispense Refill   ALPRAZolam (XANAX) 0.5 MG tablet Take 1 tablet (0.5 mg total) by mouth at bedtime as needed for anxiety. 90 tablet 0   Cholecalciferol (VITAMIN D3) 125 MCG (5000 UT) CAPS Take 1 capsule by mouth daily.     citalopram (CELEXA) 20 MG tablet TAKE 1 TABLET DAILY 90 tablet 1   diltiazem (TIAZAC) 120 MG 24 hr capsule TAKE 2 CAPSULES (240 MG TOTAL) BY MOUTH DAILY. TO GOAL BLOOD PRESSURE AROUND 130/80 180 capsule 1   docusate sodium (COLACE) 100 MG capsule Take 100 mg by  mouth daily.     furosemide (LASIX) 20 MG tablet Take 1 tablet (20 mg total) by mouth daily. 90 tablet 1   Multiple Vitamin (MULTIVITAMIN WITH MINERALS) TABS Take 1 tablet by mouth daily.     rosuvastatin (CRESTOR) 5 MG tablet Take 1 tablet (5 mg total) by mouth at bedtime. 90 tablet 0   traZODone (DESYREL) 100 MG tablet TAKE 1 TABLET AT BEDTIME ASNEEDED FOR SLEEP 30 tablet 0   No facility-administered medications prior to visit.    No Known Allergies  ROS Review of Systems Review of Systems:  A fourteen system review of systems was performed and found to be positive as per HPI.  Objective:    Physical Exam General:  Well Developed, well nourished, appropriate for stated age.  Neuro:  Alert and oriented,  extra-ocular muscles intact  HEENT:  Normocephalic, atraumatic, neck supple, no carotid bruits appreciated  Skin:  no gross rash, warm, pink. Cardiac:  RRR, S1 S2 Respiratory:  CTA B/L w/o wheezing crackles or rales, Not using accessory muscles, speaking in full sentences- unlabored. Vascular:  Ext warm, no cyanosis apprec.; cap RF less 2 sec. Right lower extremity edema (non-pitting). Psych:  No HI/SI, judgement and insight good, Euthymic mood. Full Affect.  BP 129/82   Pulse 66   Temp 98.3 F (36.8 C)   Ht 5' 3"  (1.6 m)   Wt 189 lb (85.7 kg)   LMP 03/19/1984   SpO2 98%   BMI 33.48 kg/m  Wt Readings from Last 3 Encounters:  01/30/21 189 lb (85.7 kg)  11/17/20 186 lb 12.8 oz (84.7 kg)  08/18/20 196 lb (88.9 kg)     Health Maintenance Due  Topic Date Due   COVID-19 Vaccine (3 - Pfizer risk series) 05/30/2019    There are no preventive care reminders to display for this patient.  Lab Results  Component Value Date   TSH 2.280 08/18/2020   Lab Results  Component Value Date   WBC 6.3 08/18/2020   HGB 13.6 08/18/2020   HCT 40.7 08/18/2020   MCV 95 08/18/2020   PLT 349 08/18/2020   Lab Results  Component Value Date   NA 138 08/18/2020   K 4.3 08/18/2020    CO2 22 08/18/2020   GLUCOSE 92 08/18/2020   BUN 14 08/18/2020   CREATININE 0.91 08/18/2020   BILITOT 0.6 08/18/2020   ALKPHOS 76 08/18/2020   AST 21 08/18/2020   ALT 10 08/18/2020   PROT 6.9 08/18/2020   ALBUMIN 4.5 08/18/2020   CALCIUM 9.8 08/18/2020   EGFR 68 08/18/2020   GFR 51.14 (L) 02/22/2014   Lab Results  Component Value Date   CHOL 179 01/21/2020   Lab Results  Component Value Date   HDL 74 01/21/2020   Lab Results  Component Value Date   LDLCALC 88 01/21/2020   Lab Results  Component Value Date   TRIG 94 01/21/2020   Lab Results  Component Value Date   CHOLHDL 2.4 01/21/2020   Lab Results  Component Value Date   HGBA1C 5.7 (H) 08/18/2020      Assessment & Plan:   Problem List Items Addressed This Visit       Cardiovascular and Mediastinum   Essential hypertension - Primary (Chronic)    -Stable. -Continue current medication regimen. -Will collect CMP for medication monitoring with lab visit.      PAF (paroxysmal atrial fibrillation) ([place])    -Followed by cardiology. -Reviewed recent consult. Anticoagulant was discontinued.         Other   Lymphedema    -Recommend to continue diuretic and exercise. Elevation when tolerable. -Will continue to monitor.      Hyperlipidemia    -Last lipid panel wnl's, LDL 88. -Recommend to continue current medication regimen. -Will collect lipid panel and hepatic function with lab visit.      Insomnia due to anxiety and fear    -Stable. -Continue current medication regimen. -Will continue to monitor.       No orders of the defined types were placed in this encounter.   Follow-up: Return in about 6 months (around 07/30/2021) for [facility]; lab visit tomorrow for FBW.    Lorrene Reid, PA-C

## 2021-01-30 NOTE — Assessment & Plan Note (Signed)
-  Recommend to continue diuretic and exercise. Elevation when tolerable. -Will continue to monitor.

## 2021-01-31 ENCOUNTER — Other Ambulatory Visit: Payer: Medicare Other

## 2021-01-31 DIAGNOSIS — I10 Essential (primary) hypertension: Secondary | ICD-10-CM | POA: Diagnosis not present

## 2021-01-31 DIAGNOSIS — E785 Hyperlipidemia, unspecified: Secondary | ICD-10-CM

## 2021-01-31 DIAGNOSIS — Z0000 Encounter for general adult medical examination without abnormal findings: Secondary | ICD-10-CM

## 2021-01-31 DIAGNOSIS — M25561 Pain in right knee: Secondary | ICD-10-CM | POA: Diagnosis not present

## 2021-01-31 DIAGNOSIS — R7301 Impaired fasting glucose: Secondary | ICD-10-CM | POA: Diagnosis not present

## 2021-02-01 LAB — HEMOGLOBIN A1C
Est. average glucose Bld gHb Est-mCnc: 123 mg/dL
Hgb A1c MFr Bld: 5.9 % — ABNORMAL HIGH (ref 4.8–5.6)

## 2021-02-01 LAB — COMPREHENSIVE METABOLIC PANEL
ALT: 7 IU/L (ref 0–32)
AST: 21 IU/L (ref 0–40)
Albumin/Globulin Ratio: 1.7 (ref 1.2–2.2)
Albumin: 4.5 g/dL (ref 3.8–4.8)
Alkaline Phosphatase: 73 IU/L (ref 44–121)
BUN/Creatinine Ratio: 11 — ABNORMAL LOW (ref 12–28)
BUN: 11 mg/dL (ref 8–27)
Bilirubin Total: 0.6 mg/dL (ref 0.0–1.2)
CO2: 27 mmol/L (ref 20–29)
Calcium: 9.7 mg/dL (ref 8.7–10.3)
Chloride: 101 mmol/L (ref 96–106)
Creatinine, Ser: 0.96 mg/dL (ref 0.57–1.00)
Globulin, Total: 2.6 g/dL (ref 1.5–4.5)
Glucose: 91 mg/dL (ref 70–99)
Potassium: 4.3 mmol/L (ref 3.5–5.2)
Sodium: 141 mmol/L (ref 134–144)
Total Protein: 7.1 g/dL (ref 6.0–8.5)
eGFR: 64 mL/min/{1.73_m2} (ref >59)

## 2021-02-01 LAB — CBC WITH DIFFERENTIAL/PLATELET
Basophils Absolute: 0 10*3/uL (ref 0.0–0.2)
Basos: 1 %
EOS (ABSOLUTE): 0.2 10*3/uL (ref 0.0–0.4)
Eos: 3 %
Hematocrit: 39.2 % (ref 34.0–46.6)
Hemoglobin: 12.8 g/dL (ref 11.1–15.9)
Immature Grans (Abs): 0 10*3/uL (ref 0.0–0.1)
Immature Granulocytes: 0 %
Lymphocytes Absolute: 1.7 10*3/uL (ref 0.7–3.1)
Lymphs: 29 %
MCH: 30.3 pg (ref 26.6–33.0)
MCHC: 32.7 g/dL (ref 31.5–35.7)
MCV: 93 fL (ref 79–97)
Monocytes Absolute: 0.5 10*3/uL (ref 0.1–0.9)
Monocytes: 9 %
Neutrophils Absolute: 3.4 10*3/uL (ref 1.4–7.0)
Neutrophils: 58 %
Platelets: 353 10*3/uL (ref 150–450)
RBC: 4.22 x10E6/uL (ref 3.77–5.28)
RDW: 13.1 % (ref 11.7–15.4)
WBC: 5.9 10*3/uL (ref 3.4–10.8)

## 2021-02-01 LAB — LIPID PANEL
Chol/HDL Ratio: 2.4 ratio (ref 0.0–4.4)
Cholesterol, Total: 158 mg/dL (ref 100–199)
HDL: 65 mg/dL (ref >39)
LDL Chol Calc (NIH): 77 mg/dL (ref 0–99)
Triglycerides: 83 mg/dL (ref 0–149)
VLDL Cholesterol Cal: 16 mg/dL (ref 5–40)

## 2021-02-01 LAB — TSH: TSH: 3.8 u[IU]/mL (ref 0.450–4.500)

## 2021-02-04 ENCOUNTER — Other Ambulatory Visit: Payer: Self-pay | Admitting: Physician Assistant

## 2021-02-04 DIAGNOSIS — I890 Lymphedema, not elsewhere classified: Secondary | ICD-10-CM

## 2021-02-06 ENCOUNTER — Other Ambulatory Visit (HOSPITAL_BASED_OUTPATIENT_CLINIC_OR_DEPARTMENT_OTHER): Payer: Self-pay | Admitting: Obstetrics & Gynecology

## 2021-02-06 MED ORDER — CITALOPRAM HYDROBROMIDE 20 MG PO TABS: 20.0000 mg | ORAL_TABLET | Freq: Every day | ORAL | 0 refills | Status: DC

## 2021-02-14 DIAGNOSIS — C44712 Basal cell carcinoma of skin of right lower limb, including hip: Secondary | ICD-10-CM | POA: Diagnosis not present

## 2021-02-17 ENCOUNTER — Other Ambulatory Visit: Payer: Self-pay

## 2021-02-17 ENCOUNTER — Encounter (HOSPITAL_BASED_OUTPATIENT_CLINIC_OR_DEPARTMENT_OTHER): Payer: Self-pay | Admitting: Obstetrics & Gynecology

## 2021-02-17 ENCOUNTER — Ambulatory Visit (INDEPENDENT_AMBULATORY_CARE_PROVIDER_SITE_OTHER): Payer: Medicare Other | Admitting: Obstetrics & Gynecology

## 2021-02-17 VITALS — BP 142/80 | HR 73 | Ht 63.0 in | Wt 189.4 lb

## 2021-02-17 DIAGNOSIS — I890 Lymphedema, not elsewhere classified: Secondary | ICD-10-CM

## 2021-02-17 DIAGNOSIS — Z01419 Encounter for gynecological examination (general) (routine) without abnormal findings: Secondary | ICD-10-CM

## 2021-02-17 DIAGNOSIS — Z8541 Personal history of malignant neoplasm of cervix uteri: Secondary | ICD-10-CM | POA: Diagnosis not present

## 2021-02-17 DIAGNOSIS — I480 Paroxysmal atrial fibrillation: Secondary | ICD-10-CM

## 2021-02-17 DIAGNOSIS — Z780 Asymptomatic menopausal state: Secondary | ICD-10-CM

## 2021-02-17 DIAGNOSIS — C801 Malignant (primary) neoplasm, unspecified: Secondary | ICD-10-CM

## 2021-02-17 MED ORDER — CITALOPRAM HYDROBROMIDE 20 MG PO TABS: 20.0000 mg | ORAL_TABLET | Freq: Every day | ORAL | 3 refills | Status: DC

## 2021-02-17 NOTE — Progress Notes (Signed)
70 y.o. G1P1 Married White or Caucasian female here for breast and pelvic exam.  Had knee replacement in June with Dr. Mayer Camel.  Right lower extremity edema has gotten much worse.  Does hurt.  Had a pump that she does use but can't right now because she had a basal cell on her leg recently removed.  Has not seen PT so will refer her.   Denies vaginal bleeding.  H/o small cell carcinoma of cervix in her 30's.  Has found a facebook group that she's a part of now.  States she is the oldest living survivor in this group.  Is humbling.  Patient's last menstrual period was 03/19/1984.          Sexually active: No.  H/O STD:  no  Health Maintenance: PCP:  Lorrene Reid, PA.  Last wellness appt was 01/31/2021.  Did blood work at that appt:  yes.  Reviewed in [place]. Vaccines are up to date:  yes Colonoscopy:  11/10/2018, follow up 7  MMG:  12/19/2020 Negative BMD:  12/09/2018, normal Last pap smear:  07/23/2019 Negative.   H/o abnormal pap smear:  remote hx    reports that she has never smoked. She has never used smokeless tobacco. She reports current alcohol use of about 4.0 - 5.0 standard drinks per week. She reports that she does not use drugs.  Past Medical History:  Diagnosis Date   Anemia    borderline   Atrial fibrillation ([place])    a. s/p ablation on 10/05/2016   Cancer [facility])    cervical/rad hysterectomy/bso wiht chemo for small cell ca   Dyspnea    Heart valve disorder    HLD (hyperlipidemia)    Hypertension    Joint pain    Lower extremity edema    Obesity    Palpitations     Past Surgical History:  Procedure Laterality Date   ABLATION OF DYSRHYTHMIC FOCUS  10/05/2016   ATRIAL FIBRILLATION ABLATION N/A 10/05/2016   Procedure: Atrial Fibrillation Ablation;  Surgeon: Constance Haw, MD;  Location: [place] [facility];  Service: Cardiovascular;  Laterality: N/A;   IR RADIOLOGY PERIPHERAL GUIDED IV START  09/28/2016   IR US GUIDE VASC ACCESS RIGHT  09/28/2016   RADICAL  ABDOMINAL HYSTERECTOMY  1986   with BSO   REPLACEMENT TOTAL KNEE Right 2022   TEE WITHOUT CARDIOVERSION N/A 06/07/2016   Procedure: TRANSESOPHAGEAL ECHOCARDIOGRAM (TEE);  Surgeon: Sanda Klein, MD;  Location: [facility] ENDOSCOPY;  Service: Cardiovascular;  Laterality: N/A;    Current Outpatient Medications  Medication Sig Dispense Refill   ALPRAZolam (XANAX) 0.5 MG tablet Take 1 tablet (0.5 mg total) by mouth at bedtime as needed for anxiety. 90 tablet 0   Cholecalciferol (VITAMIN D3) 125 MCG (5000 UT) CAPS Take 1 capsule by mouth daily.     citalopram (CELEXA) 20 MG tablet Take 1 tablet (20 mg total) by mouth daily. 90 tablet 0   diltiazem (TIAZAC) 120 MG 24 hr capsule TAKE 2 CAPSULES (240 MG TOTAL) BY MOUTH DAILY. TO GOAL BLOOD PRESSURE AROUND 130/80 180 capsule 1   docusate sodium (COLACE) 100 MG capsule Take 100 mg by mouth daily.     furosemide (LASIX) 20 MG tablet TAKE 1 TABLET DAILY 90 tablet 1   Multiple Vitamin (MULTIVITAMIN WITH MINERALS) TABS Take 1 tablet by mouth daily.     rosuvastatin (CRESTOR) 5 MG tablet Take 1 tablet (5 mg total) by mouth at bedtime. 90 tablet 0   traZODone (DESYREL) 100 MG  tablet TAKE 1 TABLET AT BEDTIME ASNEEDED FOR SLEEP 30 tablet 0   No current facility-administered medications for this visit.    Family History  Problem Relation Age of Onset   Diabetes Mother    Hypertension Mother    Lung cancer Mother    Heart disease Mother    Thyroid disease Mother    Depression Mother    Alcoholism Mother    Skin cancer Father    CVA Father    Diabetes Father    Hypertension Father    Cancer Paternal Grandfather        unknown type    Review of Systems  All other systems reviewed and are negative.  Exam:   BP (!) 142/80 (BP Location: Right Arm, Patient Position: Sitting, Cuff Size: Large)   Pulse 73   Ht 5\' 3"  (1.6 m)   Wt 189 lb 6.4 oz (85.9 kg)   LMP 03/19/1984   BMI 33.55 kg/m   Height: 5\' 3"  (160 cm)  General appearance: alert, cooperative  and appears stated age Breasts: normal appearance, no masses or tenderness Abdomen: soft, non-tender; bowel sounds normal; no masses,  no organomegaly Lymph nodes: Cervical, supraclavicular, and axillary nodes normal.  No abnormal inguinal nodes palpated Neurologic: Grossly normal  Pelvic: External genitalia:  no lesions              Urethra:  normal appearing urethra with no masses, tenderness or lesions              Bartholins and Skenes: normal                 Vagina: atrophic vaginal tissue, no lesions              Cervix: absent              Pap taken: No. Bimanual Exam:  Uterus:  uterus absent              Adnexa: no mass, fullness, tenderness               Rectovaginal: Confirms               Anus:  normal sphincter tone, no lesions  Chaperone, Octaviano Batty, CMA, was present for exam.  Assessment/Plan: 1. Encntr for gyn exam (general) (routine) w/o abn findings - pap smear neg 07/2019.  Not indicated today. - MMG 12/2020 - Colonoscopy 10/2020 - BMD 11/2018, normal - lab work with PCP - vaccines reviewed/updated  2. Postmenopausal - off HT  3. Small cell carcinoma ([place])- of the cervix - s/p radical hysterectomy with BSO in 1986  4. Lymphedema of right lower extremity - Ambulatory referral to Physical Therapy  5. Paroxysmal atrial fibrillation [facility]), s/p cardiac ablation

## 2021-02-21 ENCOUNTER — Other Ambulatory Visit: Payer: Self-pay | Admitting: Physician Assistant

## 2021-02-21 DIAGNOSIS — E785 Hyperlipidemia, unspecified: Secondary | ICD-10-CM

## 2021-02-21 DIAGNOSIS — I10 Essential (primary) hypertension: Secondary | ICD-10-CM

## 2021-02-21 DIAGNOSIS — I480 Paroxysmal atrial fibrillation: Secondary | ICD-10-CM

## 2021-02-24 ENCOUNTER — Ambulatory Visit: Payer: Medicare Other | Attending: Obstetrics & Gynecology | Admitting: Physical Therapy

## 2021-02-24 ENCOUNTER — Other Ambulatory Visit: Payer: Self-pay

## 2021-02-24 DIAGNOSIS — I890 Lymphedema, not elsewhere classified: Secondary | ICD-10-CM | POA: Insufficient documentation

## 2021-02-24 NOTE — Therapy (Signed)
[facility] @ [address] [place], [place], [address] Phone: [phone]   Fax:  [phone]  Physical Therapy Evaluation  Patient Details  Name: Savannah Hill MRN: [id number] Date of Birth: Aug 18, 1950 Referring Provider (PT): Dr. Megan Salon   Encounter Date: 02/24/2021   PT End of Session - 02/24/21 1112     Visit Number 1    Number of Visits 25    Date for PT Re-Evaluation 05/17/21   pt will be starting treatment after Jan 1   PT Start Time 0900    PT Stop Time 1000    PT Time Calculation (min) 60 min    Activity Tolerance Patient tolerated treatment well    Behavior During Therapy [facility] for tasks assessed/performed             Past Medical History:  Diagnosis Date   Anemia    borderline   Atrial fibrillation ([place])    a. s/p ablation on 10/05/2016   Cancer ([place])    cervical/rad hysterectomy/bso wiht chemo for small cell ca   Dyspnea    Heart valve disorder    HLD (hyperlipidemia)    Hypertension    Joint pain    Lower extremity edema    Obesity    Palpitations     Past Surgical History:  Procedure Laterality Date   ABLATION OF DYSRHYTHMIC FOCUS  10/05/2016   ATRIAL FIBRILLATION ABLATION N/A 10/05/2016   Procedure: Atrial Fibrillation Ablation;  Surgeon: Constance Haw, MD;  Location: [place] [facility];  Service: Cardiovascular;  Laterality: N/A;   IR RADIOLOGY PERIPHERAL GUIDED IV START  09/28/2016   IR US GUIDE VASC ACCESS RIGHT  09/28/2016   RADICAL ABDOMINAL HYSTERECTOMY  1986   with BSO   REPLACEMENT TOTAL KNEE Right 2022   TEE WITHOUT CARDIOVERSION N/A 06/07/2016   Procedure: TRANSESOPHAGEAL ECHOCARDIOGRAM (TEE);  Surgeon: Sanda Klein, MD;  Location: [facility] ENDOSCOPY;  Service: Cardiovascular;  Laterality: N/A;    There were no vitals filed for this visit.    Subjective Assessment - 02/24/21 0909     Subjective Pt is here to find out about else she can do for the lymphedema  in her right leg.  She has had increase in the swelling in her leg since her total knee surgery.    Pertinent History radical hysterectomy in 1986 for small cell cervical endocrine cancer with 40 pelvic lymph nodes removed with chemo, no radiation.  She developed some swelling after the procedure but has noticed and increase as she has gotten older and with some weight gain.  She has a totat knee  June 2022 and has had an increase in lymphedema since.   She has an extremity  pump from [employer] that she got in June. She says she feel some fullness in her lower abdomen on the left side.  She has also used compression stockings. She had recent removal of basal cell carcinoma on left knee    Patient Stated Goals to be able to get her lymphedema under control    Currently in Pain? No/denies                [facility] PT Assessment - 02/24/21 0001       Assessment   Medical Diagnosis s/p cervical cancer lymphedema    Referring Provider (PT) Dr. Megan Salon    Onset Date/Surgical Date 03/19/84   approx   Hand Dominance Left  Precautions   Precautions Other (comment)    Precaution Comments recent total knee      Restrictions   Weight Bearing Restrictions No      Balance Screen   Has the patient fallen in the past 6 months Yes    How many times? 1   when line dancing with tennis shoes on the carpet   Has the patient had a decrease in activity level because of a fear of falling?  No    Is the patient reluctant to leave their home because of a fear of falling?  No      Home Environment   Living Environment Private residence    Living Arrangements Spouse/significant other   plans to move to one story house     Prior Function   Level of [place] Retired    Leisure occasionally goes to gym want to play golf again considers that she has an active lifestyle      Cognition   Overall Cognitive Status Within Functional Limits for tasks assessed       Observation/Other Assessments   Observations well healed abdominal scar. Visible lymphostatic lymphedema with congestion in left leg especially below knee.  She has some erythema in right anterior leg and hyperkeratosis in right posterior leg with skin thickening and raised areas. There is a deep skin fold at ankle from top of sock and foot appears flatter as she usually wears a tied shoe on her foot.    Skin Integrity stitched area at righ lateral knee with bandage covering it from recent basal cell removal by dermatologist      Sensation   Light Touch Not tested      Coordination   Gross Motor Movements are Fluid and Coordinated No   pt states her movements are difficult as her left leg feels so heavy     Posture/Postural Control   Posture/Postural Control No significant limitations      ROM / Strength   AROM / PROM / Strength AROM      AROM   Overall AROM  Within functional limits for tasks performed   recent right total knee appears function, did not measure     Palpation   Palpation comment thick congested tissue on right lower skin with that softens  with manual pressure               LYMPHEDEMA/ONCOLOGY QUESTIONNAIRE - 02/24/21 0001       Type   Cancer Type cervical      Surgeries   Other Surgery Date 03/19/84   approx   Number Lymph Nodes Removed 40      Date Lymphedema/Swelling Started   Date 03/19/86   approx     Treatment   Past Chemotherapy Treatment Yes    Date 03/19/84   approx     What other symptoms do you have   Are you Having Heaviness or Tightness Yes    Are you having Pain No    Are you having pitting edema Yes    Body Site right lower leg    Is it Hard or Difficult finding clothes that fit Yes    Do you have infections No    Is there Decreased scar mobility Yes   did not fully test due to recent totatl knee and basal cell removal surgery   [place] Sign Yes    Other Symptoms hyperkeratosis, lymphostatic lymphedema, erythema      Lymphedema  Stage   Stage STAGE 2 SPONTANEOUSLY IRREVERSIBLE      Lymphedema Assessments   Lymphedema Assessments Lower extremities      Right Lower Extremity Lymphedema   20 cm Proximal to Suprapatella 69.5 cm    10 cm Proximal to Suprapatella 60.5 cm    At Midpatella/Popliteal Crease 47 cm    30 cm Proximal to Floor at Lateral Plantar Foot 51.2 cm    20 cm Proximal to Floor at Lateral Plantar Foot 45 1    10 cm Proximal to Floor at Lateral Malleoli 31.5 cm   just above ankle crease   5 cm Proximal to 1st MTP Joint 23.5 cm    Around Proximal Great Toe 8 cm      Left Lower Extremity Lymphedema   20 cm Proximal to Suprapatella 67 cm    10 cm Proximal to Suprapatella 55 cm    At Midpatella/Popliteal Crease 43 cm    30 cm Proximal to Floor at Lateral Plantar Foot 42.5 cm    20 cm Proximal to Floor at Lateral Plantar Foot 33 cm    10 cm Proximal to Floor at Lateral Malleoli 23 cm    5 cm Proximal to 1st MTP Joint 21 cm    Around Proximal Great Toe 7.5 cm                      Objective measurements completed on examination: See above findings.       [facility] Adult PT Treatment/Exercise - 02/24/21 0001       Self-Care   Self-Care Other Self-Care Comments    Other Self-Care Comments  Prolonged time answering pt and husband questions and talking about treatment options: circular vs flatknit, daytime vs nighttime garments, different garments for pump that includes abdomen, components of complete decongestive therapy                       PT Short Term Goals - 02/24/21 1123       PT SHORT TERM GOAL #1   Title Pt/husband will be able to perform self manual lymph drainage or report using pump with appropriate sleeve    Time 4    Period Weeks    Status New      PT SHORT TERM GOAL #2   Title Pt/ husband will be able to do self compression bandaging    Time 4    Period Weeks    Status New      PT SHORT TERM GOAL #3   Title Pt will report she knows how to use  exercise to help control her lymphedema    Time 4    Period Weeks    Status New               PT Long Term Goals - 02/24/21 1125       PT LONG TERM GOAL #1   Title Pt will report she has or knows how to get appropriate day and nighttime garments to help control her lymphedema    Time 8    Period Weeks    Status New      PT LONG TERM GOAL #2   Title Pt reports she knows how to control her lymphedema at home with lymph drainage manually or with pump, compression and exercise    Time 8    Period Weeks    Status New  Plan - 02/24/21 1115     Clinical Impression Statement Pt comes to PT with progression of her chronic right lower extremity lymphedema from cervical cancer treatment in 1986 after a new right total knee replacement in June 2022. She has lyphostatic lymphedema with congestion and pitting, hyperkeratosis,  and erythema.  She has an extremity pump, put is experienceing abdominal fullness as well and may be able to get a different pump sleeve with abdominal component. She has never had flat knit or night garments and would like to learn more about that.  She has not had manual lymph draiange or compression bandaging by a CLT and would like to try that also.  Pt willing to come 3x a week for complete decongestive therapy and will start after the holidays so she will have a consistent schedule.    Personal Factors and Comorbidities Comorbidity 3+    Comorbidities chronic lymphedema, abdominal surgery for cervical cancer removal with 40 lymph nodes removed, right total knee in June 2022.    Examination-Activity Limitations Locomotion Level;Stairs    Stability/Clinical Decision Making Stable/Uncomplicated    Clinical Decision Making Low    Rehab Potential Excellent    PT Frequency 3x / week    PT Duration 8 weeks    PT Treatment/Interventions ADLs/Self Care Home Management;Therapeutic exercise;Patient/family education;Orthotic Fit/Training;Manual  techniques;Manual lymph drainage;Compression bandaging;Scar mobilization;Taping;Passive range of motion    PT Next Visit Plan send demographics for 2023 insurance benefits, perform and teach manual lymph drainage and compression bandaging, reinforce exercise, help pt get appropriate day and night garments, consider helping pt explore getting a different sleeve for pump that will provide abdominal decongestion    Consulted and Agree with Plan of Care Patient;Family member/caregiver    Family Member Consulted husband             Patient will benefit from skilled therapeutic intervention in order to improve the following deficits and impairments:  Increased edema, Decreased knowledge of use of DME, Decreased knowledge of precautions  Visit Diagnosis: Lymphedema, not elsewhere classified     Problem List Patient Active Problem List   Diagnosis Date Noted   Encounter for preoperative examination for general surgical procedure 08/18/2020   Insomnia due to anxiety and fear 08/18/2020   Stress at home- of MOM who is ETOH abuse 06/17/2019   Hyperlipidemia 06/17/2019   HLD (hyperlipidemia)    Obesity 12/05/2018   Vitamin D deficiency 12/05/2018   Postmenopausal 12/05/2018   Family history of diabetes mellitus in father 12/05/2018   Impaired fasting glucose 12/05/2018   Small cell carcinoma ([place])- of the cervix 12/05/2018   Insect bite 09/17/2018   Healthcare maintenance 09/17/2018   Generalized muscle ache 05/15/2018   Hip pain, bilateral 05/15/2018   Chronic pain of both shoulders 05/15/2018   Essential hypertension 01/29/2018   Insomnia 01/29/2018   Attention deficit hyperactivity disorder (ADHD) 01/29/2018   Mood disorder - mixed depression and anxiety 01/29/2018   Excessive drinking of alcohol 01/29/2018   Encounter for long-term (current) use of high-risk medication 01/29/2018   Inactivity 01/29/2018   Health education/counseling 01/29/2018   Chronic anticoagulation  10/06/2016   Paroxysmal atrial fibrillation ([place]) 10/05/2016   Mitral valve disease    PAF (paroxysmal atrial fibrillation) ([place]) 07/01/2013   LAFB (left anterior fascicular block) 07/01/2013   Lymphedema 07/01/2013   Chest pain at rest 10/01/2011   Leg swelling 10/01/2011   h/o Cervical cancer 10/01/2011   H/O total hysterectomy 10/01/2011   Donato Heinz. Owens Shark, PT  Norwood Levo, PT 02/24/2021, 11:27 AM  [facility] @ [address] [place], [place], [address] Phone: [phone]   Fax:  [phone]  Name: Savannah Hill MRN: [id number] Date of Birth: [birth year]

## 2021-03-01 ENCOUNTER — Other Ambulatory Visit: Payer: Self-pay | Admitting: Physician Assistant

## 2021-03-01 DIAGNOSIS — F5101 Primary insomnia: Secondary | ICD-10-CM

## 2021-03-24 ENCOUNTER — Other Ambulatory Visit: Payer: Self-pay

## 2021-03-24 ENCOUNTER — Ambulatory Visit: Payer: Medicare Other | Attending: Obstetrics & Gynecology

## 2021-03-24 DIAGNOSIS — I890 Lymphedema, not elsewhere classified: Secondary | ICD-10-CM | POA: Insufficient documentation

## 2021-03-24 NOTE — Patient Instructions (Signed)
PLEASE KEEP YOUR BANDAGES ON AS LONG AS POSSIBLE TO GET THE BEST SWELLING REDUCTION. Should your bandages become uncomfortable or feel too tight, follow these steps: Elevate your extremity higher than your heart.  Try to move your arm or leg joints against the firmness of the bandage to help with moving the fluid and allow the bandages to loosen a bit.  If the bandaging is still is too tight, it is ok to carefully remove the top layer.  There will still be more layers under it that can provide compression to your extremity. Finally, if you STILL have significant pain after trying these steps, it is ok to take the bandage off.  Check your skin carefully for any signs of irritation  PLEASE bring ALL bandage materials back to your next appointment as we will reuse what we can TAKE CARE OF YOUR BANDAGES SO THEY WILL LAST LONGER AND STAY IN BETTER CONDITION Washing bandages:  Wash periodically using a mild detergent in warm water.  Do not use fabric softener or bleach.  Place bandages in a mesh lingerie bag or in a tied off pillow case and use the gentle cycle of the washing machine or hand wash. If you hand wash, you may want to put them in the spin cycle of your washer to get the extra water out, but make sure you put them in a mesh bag first. Do not wring or stretch them while they are wet.  Drying bandages: Lay the bandages out smoothly on a towel away from direct sunlight or heating sources that can damage the fabric. Rolling bandages in a towel and gently squeezing the towel to remove excess water before laying them out can speed up the process.  If you use a drying rack, place a towel on top of the rack to lay the bandages on.  If they hang down to dry, they fabric could be stretched out and the bandage will lose its compression.   Or, keep bandages in the mesh bag and dry them in the dryer on the low or no heat cycle. Rolling bandages: Please roll your bandages after drying them so they are ready for  your next treatment. If they are rolled too loose, they will be difficult to apply.  If rolled too tight, they can get stretched out.   TAKE CARE OF YOUR SKIN Apply a low pH moisturizing lotion to your skin daily Avoid scratching your skin Treat skin irritations quickly  Know the 5 warning signs of infection: redness, pain, warmth to touch, fever and increased swelling.  Call your physician immediately if you notice any of these signs of a possible infection. Self bandaging the leg:   Follow along with this video:  [email or web address]  -OR-  Search in your internet browser: "self bandaging leg MD Ouida Sills"   And the video should appear in the video search results "Lymphedema Management: Self bandaging your leg" on YouTube   This video is for caregiver bandaging:  [email or web address]  -OR-  Search "Lymphedema management: Caregiver bandaging your leg" in your search browser and the video should appear in the search results

## 2021-03-24 NOTE — Therapy (Signed)
[facility] @ [address] [place], [place], [address] Phone: [phone]   Fax:  [phone]  Physical Therapy Treatment  Patient Details  Name: Savannah Hill MRN: [id number] Date of Birth: 1950/09/22 Referring Provider (PT): Dr. Megan Salon   Encounter Date: 03/24/2021   PT End of Session - 03/24/21 1228     Visit Number 2    Number of Visits 25    Date for PT Re-Evaluation 05/17/21    PT Start Time 5852    PT Stop Time 1104    PT Time Calculation (min) 62 min    Activity Tolerance Patient tolerated treatment well    Behavior During Therapy [facility] for tasks assessed/performed             Past Medical History:  Diagnosis Date   Anemia    borderline   Atrial fibrillation ([place])    a. s/p ablation on 10/05/2016   Cancer ([place])    cervical/rad hysterectomy/bso wiht chemo for small cell ca   Dyspnea    Heart valve disorder    HLD (hyperlipidemia)    Hypertension    Joint pain    Lower extremity edema    Obesity    Palpitations     Past Surgical History:  Procedure Laterality Date   ABLATION OF DYSRHYTHMIC FOCUS  10/05/2016   ATRIAL FIBRILLATION ABLATION N/A 10/05/2016   Procedure: Atrial Fibrillation Ablation;  Surgeon: Constance Haw, MD;  Location: [place] [facility];  Service: Cardiovascular;  Laterality: N/A;   IR RADIOLOGY PERIPHERAL GUIDED IV START  09/28/2016   IR US GUIDE VASC ACCESS RIGHT  09/28/2016   RADICAL ABDOMINAL HYSTERECTOMY  1986   with BSO   REPLACEMENT TOTAL KNEE Right 2022   TEE WITHOUT CARDIOVERSION N/A 06/07/2016   Procedure: TRANSESOPHAGEAL ECHOCARDIOGRAM (TEE);  Surgeon: Sanda Klein, MD;  Location: [facility] ENDOSCOPY;  Service: Cardiovascular;  Laterality: N/A;    There were no vitals filed for this visit.   Subjective Assessment - 03/24/21 1216     Subjective Pt did not know to bring shoes she could get on over bandages and to wear pants she can get over bandages.  She  did have on a compression stocking today.    Pertinent History radical hysterectomy in 1986 for small cell cervical endocrine cancer with 40 pelvic lymph nodes removed with chemo, no radiation.  She developed some swelling after the procedure but has noticed and increase as she has gotten older and with some weight gain.  She has a totat knee  June 2022 and has had an increase in lymphedema since.   She has an extremity  pump from [employer] that she got in June. She says she feel some fullness in her lower abdomen on the left side.  She has also used compression stockings. She had recent removal of basal cell carcinoma on left knee    Patient Stated Goals to be able to get her lymphedema under control    Currently in Pain? No/denies                               [facility] Adult PT Treatment/Exercise - 03/24/21 0001       Manual Therapy   Manual therapy comments Pt and her husband educated in types of shoes ie with velcro appropriate for wearing with bandages, and proper pants to accomodate for wraps.    Compression Bandaging  TG soft was cut for right LE and pts husband was educated in compression bandaging to toes x 2-3 repetitions, first foot wrap with figure 8's and 2nd wrap for heel locks.  Started with 8 cm wraps to foot, but switched to 6 cm wrap for foot and 8 cm for heel locks secondary to pt with a small foot.  Will also use 1-2 inch elastomull for toes.  REmoved wraps before leaving but kept wraps to take hoe and practice. Pt and her husband had numerous good questions. gave instructions for bandaging and MD Ouida Sills video link                     PT Education - 03/24/21 1228     Education Details Bandaging instructions for proper care, video link    Person(s) Educated Patient;Spouse    Comprehension Verbalized understanding;Returned demonstration;Need further instruction              PT Short Term Goals - 02/24/21 1123       PT SHORT TERM GOAL #1    Title Pt/husband will be able to perform self manual lymph drainage or report using pump with appropriate sleeve    Time 4    Period Weeks    Status New      PT SHORT TERM GOAL #2   Title Pt/ husband will be able to do self compression bandaging    Time 4    Period Weeks    Status New      PT SHORT TERM GOAL #3   Title Pt will report she knows how to use exercise to help control her lymphedema    Time 4    Period Weeks    Status New               PT Long Term Goals - 02/24/21 1125       PT LONG TERM GOAL #1   Title Pt will report she has or knows how to get appropriate day and nighttime garments to help control her lymphedema    Time 8    Period Weeks    Status New      PT LONG TERM GOAL #2   Title Pt reports she knows how to control her lymphedema at home with lymph drainage manually or with pump, compression and exercise    Time 8    Period Weeks    Status New                   Plan - 03/24/21 1229     Clinical Impression Statement Discussed many questions that pt and her husband had about compression, shoes, length of wrapping etc. Educated pts husband in compression bandaging to toes with elastomull, foot with 6 cm wrap, and 8 cm wrap to ankle for heel locks.(Used 8cm and 10 cm in clinic but gave 6 and 8 to try at home)  He practiced toe wrap several times and foot wraps x 1 each and did very well overall. They were given wraps to take home and the link to MD [facility] wrapping video, as well as written instructions for care and removal of bandages.  Asked them to practice wraps over the weekend if possible and we will start fresh on Monday.    Personal Factors and Comorbidities Comorbidity 3+    Comorbidities chronic lymphedema, abdominal surgery for cervical cancer removal with 40 lymph nodes removed, right total knee in June 2022.  Examination-Activity Limitations Locomotion Level;Stairs    Stability/Clinical Decision Making Stable/Uncomplicated     Rehab Potential Excellent    PT Frequency 3x / week    PT Duration 8 weeks    PT Treatment/Interventions ADLs/Self Care Home Management;Therapeutic exercise;Patient/family education;Orthotic Fit/Training;Manual techniques;Manual lymph drainage;Compression bandaging;Scar mobilization;Taping;Passive range of motion    PT Next Visit Plan send demographics for 2023 insurance benefits, perform and teach manual lymph drainage and compression bandaging, reinforce exercise, help pt get appropriate day and night garments, consider helping pt explore getting a different sleeve for pump that will provide abdominal decongestion    Consulted and Agree with Plan of Care Patient;Family member/caregiver    Family Member Consulted husband             Patient will benefit from skilled therapeutic intervention in order to improve the following deficits and impairments:  Increased edema, Decreased knowledge of use of DME, Decreased knowledge of precautions  Visit Diagnosis: Lymphedema, not elsewhere classified     Problem List Patient Active Problem List   Diagnosis Date Noted   Encounter for preoperative examination for general surgical procedure 08/18/2020   Insomnia due to anxiety and fear 08/18/2020   Stress at home- of MOM who is ETOH abuse 06/17/2019   Hyperlipidemia 06/17/2019   HLD (hyperlipidemia)    Obesity 12/05/2018   Vitamin D deficiency 12/05/2018   Postmenopausal 12/05/2018   Family history of diabetes mellitus in father 12/05/2018   Impaired fasting glucose 12/05/2018   Small cell carcinoma ([place])- of the cervix 12/05/2018   Insect bite 09/17/2018   Healthcare maintenance 09/17/2018   Generalized muscle ache 05/15/2018   Hip pain, bilateral 05/15/2018   Chronic pain of both shoulders 05/15/2018   Essential hypertension 01/29/2018   Insomnia 01/29/2018   Attention deficit hyperactivity disorder (ADHD) 01/29/2018   Mood disorder - mixed depression and anxiety 01/29/2018    Excessive drinking of alcohol 01/29/2018   Encounter for long-term (current) use of high-risk medication 01/29/2018   Inactivity 01/29/2018   Health education/counseling 01/29/2018   Chronic anticoagulation 10/06/2016   Paroxysmal atrial fibrillation ([place]) 10/05/2016   Mitral valve disease    PAF (paroxysmal atrial fibrillation) ([place]) 07/01/2013   LAFB (left anterior fascicular block) 07/01/2013   Lymphedema 07/01/2013   Chest pain at rest 10/01/2011   Leg swelling 10/01/2011   h/o Cervical cancer 10/01/2011   H/O total hysterectomy 10/01/2011    Claris Pong, PT 03/24/2021, 12:34 PM  [place] @ [place], [place], [address] Phone: [phone]   Fax:  [phone]  Name: ARYIANNA EARWOOD MRN: [id number] Date of Birth: 02-Apr-1950

## 2021-03-27 ENCOUNTER — Other Ambulatory Visit: Payer: Self-pay | Admitting: Physician Assistant

## 2021-03-27 ENCOUNTER — Other Ambulatory Visit: Payer: Self-pay

## 2021-03-27 ENCOUNTER — Ambulatory Visit: Payer: Medicare Other

## 2021-03-27 DIAGNOSIS — I890 Lymphedema, not elsewhere classified: Secondary | ICD-10-CM

## 2021-03-27 DIAGNOSIS — F5101 Primary insomnia: Secondary | ICD-10-CM

## 2021-03-27 NOTE — Therapy (Signed)
[facility] @ [place], [place], [address] Phone: [phone]   Fax:  [phone]  Physical Therapy Treatment  Patient Details  Name: Savannah Hill MRN: [id number] Date of Birth: 14-Feb-1951 Referring Provider (PT): Dr. Megan Salon   Encounter Date: 03/27/2021   PT End of Session - 03/27/21 1214     Visit Number 3    Number of Visits 25    Date for PT Re-Evaluation 05/17/21    PT Start Time 1102    PT Stop Time 1200    PT Time Calculation (min) 58 min    Activity Tolerance Patient tolerated treatment well    Behavior During Therapy [facility] for tasks assessed/performed             Past Medical History:  Diagnosis Date   Anemia    borderline   Atrial fibrillation ([place])    a. s/p ablation on 10/05/2016   Cancer ([place])    cervical/rad hysterectomy/bso wiht chemo for small cell ca   Dyspnea    Heart valve disorder    HLD (hyperlipidemia)    Hypertension    Joint pain    Lower extremity edema    Obesity    Palpitations     Past Surgical History:  Procedure Laterality Date   ABLATION OF DYSRHYTHMIC FOCUS  10/05/2016   ATRIAL FIBRILLATION ABLATION N/A 10/05/2016   Procedure: Atrial Fibrillation Ablation;  Surgeon: Constance Haw, MD;  Location: [facility];  Service: Cardiovascular;  Laterality: N/A;   IR RADIOLOGY PERIPHERAL GUIDED IV START  09/28/2016   IR US GUIDE VASC ACCESS RIGHT  09/28/2016   RADICAL ABDOMINAL HYSTERECTOMY  1986   with BSO   REPLACEMENT TOTAL KNEE Right 2022   TEE WITHOUT CARDIOVERSION N/A 06/07/2016   Procedure: TRANSESOPHAGEAL ECHOCARDIOGRAM (TEE);  Surgeon: Sanda Klein, MD;  Location: [facility] ENDOSCOPY;  Service: Cardiovascular;  Laterality: N/A;    There were no vitals filed for this visit.   Subjective Assessment - 03/27/21 1209     Subjective Got some velcro sandals and wore the same pants.  Have lotion on  today.  Husband reports practicing the wraps  before coming today so we could check them.    Pertinent History radical hysterectomy in 1986 for small cell cervical endocrine cancer with 40 pelvic lymph nodes removed with chemo, no radiation.  She developed some swelling after the procedure but has noticed and increase as she has gotten older and with some weight gain.  She has a totat knee  June 2022 and has had an increase in lymphedema since.   She has an extremity  pump from [employer] that she got in June. She says she feel some fullness in her lower abdomen on the left side.  She has also used compression stockings. She had recent removal of basal cell carcinoma on left knee    Patient Stated Goals to be able to get her lymphedema under control    Currently in Pain? No/denies    Multiple Pain Sites No                               [facility] Adult PT Treatment/Exercise - 03/27/21 0001       Manual Therapy   Compression Bandaging Checked wraps that pts husband had put on prior to coming.  Needed another round of toe wrap at bottom of toes, and more circles  at base of toes with 6 cm wrap. pts husband was re-educated in compression bandaging to toes x 2 repetitions, first foot wrap with figure 8's and 2nd wrap for heel locks each x 1..  Applied 10 cm comprilan roll over TG soft, 6 cm wrap for foot and 8 cm for heel locks secondary to pt with a small foot.  also  used 1-2 inch elastomull for toes.  Therapist then applied 12 cm wrap ankle to knee,15 cm comprilan soft knee to thigh, and then had pt stand and 2 12 cm wraps applied knee to thigh.  TG soft pulled down over it.                       PT Short Term Goals - 02/24/21 1123       PT SHORT TERM GOAL #1   Title Pt/husband will be able to perform self manual lymph drainage or report using pump with appropriate sleeve    Time 4    Period Weeks    Status New      PT SHORT TERM GOAL #2   Title Pt/ husband will be able to do self compression bandaging    Time 4     Period Weeks    Status New      PT SHORT TERM GOAL #3   Title Pt will report she knows how to use exercise to help control her lymphedema    Time 4    Period Weeks    Status New               PT Long Term Goals - 02/24/21 1125       PT LONG TERM GOAL #1   Title Pt will report she has or knows how to get appropriate day and nighttime garments to help control her lymphedema    Time 8    Period Weeks    Status New      PT LONG TERM GOAL #2   Title Pt reports she knows how to control her lymphedema at home with lymph drainage manually or with pump, compression and exercise    Time 8    Period Weeks    Status New                   Plan - 03/27/21 1214     Clinical Impression Statement Pts husband did much better today with review of toe wrap, 6 cm wrap to foot, and 8 cm to ankle but still required cueing.  He was also able to get the feel for the proper way to snap the roll to get compression instead of a long pull. Therapist performed remaining wrap from above ankle to knee, and then 2 weraps from knee to thigh.  Pt was able to get her Teva sandals on and her pants without difficulty    Personal Factors and Comorbidities Comorbidity 3+    Comorbidities chronic lymphedema, abdominal surgery for cervical cancer removal with 40 lymph nodes removed, right total knee in June 2022.    Examination-Activity Limitations Locomotion Level;Stairs    Stability/Clinical Decision Making Stable/Uncomplicated    Rehab Potential Excellent    PT Frequency 3x / week    PT Duration 8 weeks    PT Treatment/Interventions ADLs/Self Care Home Management;Therapeutic exercise;Patient/family education;Orthotic Fit/Training;Manual techniques;Manual lymph drainage;Compression bandaging;Scar mobilization;Taping;Passive range of motion    PT Next Visit Plan send demographics for 2023 insurance benefits, perform and teach manual lymph drainage  and compression bandaging, reinforce exercise, help pt  get appropriate day and night garments, consider helping pt explore getting a different sleeve for pump that will provide abdominal decongestion    Consulted and Agree with Plan of Care Patient;Family member/caregiver    Family Member Consulted husband             Patient will benefit from skilled therapeutic intervention in order to improve the following deficits and impairments:  Increased edema, Decreased knowledge of use of DME, Decreased knowledge of precautions  Visit Diagnosis: Lymphedema, not elsewhere classified     Problem List Patient Active Problem List   Diagnosis Date Noted   Encounter for preoperative examination for general surgical procedure 08/18/2020   Insomnia due to anxiety and fear 08/18/2020   Stress at home- of MOM who is ETOH abuse 06/17/2019   Hyperlipidemia 06/17/2019   HLD (hyperlipidemia)    Obesity 12/05/2018   Vitamin D deficiency 12/05/2018   Postmenopausal 12/05/2018   Family history of diabetes mellitus in father 12/05/2018   Impaired fasting glucose 12/05/2018   Small cell carcinoma ([place])- of the cervix 12/05/2018   Insect bite 09/17/2018   Healthcare maintenance 09/17/2018   Generalized muscle ache 05/15/2018   Hip pain, bilateral 05/15/2018   Chronic pain of both shoulders 05/15/2018   Essential hypertension 01/29/2018   Insomnia 01/29/2018   Attention deficit hyperactivity disorder (ADHD) 01/29/2018   Mood disorder - mixed depression and anxiety 01/29/2018   Excessive drinking of alcohol 01/29/2018   Encounter for long-term (current) use of high-risk medication 01/29/2018   Inactivity 01/29/2018   Health education/counseling 01/29/2018   Chronic anticoagulation 10/06/2016   Paroxysmal atrial fibrillation ([facility]) 10/05/2016   Mitral valve disease    PAF (paroxysmal atrial fibrillation) ([place]) 07/01/2013   LAFB (left anterior fascicular block) 07/01/2013   Lymphedema 07/01/2013   Chest pain at rest 10/01/2011   Leg swelling  10/01/2011   h/o Cervical cancer 10/01/2011   H/O total hysterectomy 10/01/2011    Claris Pong, PT 03/27/2021, 12:18 PM  [facility] @ [place], [place], [address] Phone: [phone]   Fax:  [phone]  Name: Savannah Hill MRN: [id number] Date of Birth: 26-Jan-1951

## 2021-03-29 ENCOUNTER — Other Ambulatory Visit: Payer: Self-pay

## 2021-03-29 ENCOUNTER — Ambulatory Visit: Payer: Medicare Other

## 2021-03-29 DIAGNOSIS — I890 Lymphedema, not elsewhere classified: Secondary | ICD-10-CM

## 2021-03-29 NOTE — Therapy (Signed)
[facility] @ [place], [place], [address] Phone: [phone]   Fax:  [phone]  Physical Therapy Treatment  Patient Details  Name: Savannah Hill MRN: [id number] Date of Birth: Feb 02, 1951 Referring Provider (PT): Dr. Megan Salon   Encounter Date: 03/29/2021   PT End of Session - 03/29/21 1058     Visit Number 4    Number of Visits 25    Date for PT Re-Evaluation 05/17/21    PT Start Time 1059    PT Stop Time 1152    PT Time Calculation (min) 53 min    Activity Tolerance Patient tolerated treatment well    Behavior During Therapy [facility] for tasks assessed/performed             Past Medical History:  Diagnosis Date   Anemia    borderline   Atrial fibrillation ([place])    a. s/p ablation on 10/05/2016   Cancer ([place])    cervical/rad hysterectomy/bso wiht chemo for small cell ca   Dyspnea    Heart valve disorder    HLD (hyperlipidemia)    Hypertension    Joint pain    Lower extremity edema    Obesity    Palpitations     Past Surgical History:  Procedure Laterality Date   ABLATION OF DYSRHYTHMIC FOCUS  10/05/2016   ATRIAL FIBRILLATION ABLATION N/A 10/05/2016   Procedure: Atrial Fibrillation Ablation;  Surgeon: Constance Haw, MD;  Location: [facility];  Service: Cardiovascular;  Laterality: N/A;   IR RADIOLOGY PERIPHERAL GUIDED IV START  09/28/2016   IR US GUIDE VASC ACCESS RIGHT  09/28/2016   RADICAL ABDOMINAL HYSTERECTOMY  1986   with BSO   REPLACEMENT TOTAL KNEE Right 2022   TEE WITHOUT CARDIOVERSION N/A 06/07/2016   Procedure: TRANSESOPHAGEAL ECHOCARDIOGRAM (TEE);  Surgeon: Sanda Klein, MD;  Location: [facility] ENDOSCOPY;  Service: Cardiovascular;  Laterality: N/A;    There were no vitals filed for this visit.   Subjective Assessment - 03/29/21 1057     Subjective I took wraps off at 9:50 and took a shower.    Pertinent History radical hysterectomy in 1986 for small cell  cervical endocrine cancer with 40 pelvic lymph nodes removed with chemo, no radiation.  She developed some swelling after the procedure but has noticed and increase as she has gotten older and with some weight gain.  She has a totat knee  June 2022 and has had an increase in lymphedema since.   She has an extremity  pump from [employer] that she got in June. She says she feel some fullness in her lower abdomen on the left side.  She has also used compression stockings. She had recent removal of basal cell carcinoma on left knee    Patient Stated Goals to be able to get her lymphedema under control    Currently in Pain? No/denies                               [facility] Adult PT Treatment/Exercise - 03/29/21 0001       Manual Therapy   Manual Lymphatic Drainage (MLD) Initiated MLD to short neck, Right axillary and inguinal LN's, 5 breaths, right inguino-axillary pathway, right lateral thigh, then medial to lateral and lateral thigh retracing pathway, then right lower leg front and back retracing steps and ending with LN's. Pt. practice LN activation, breaths and pathway and did  well after practicing.    Compression Bandaging Pt wrapped by therapist with explanation to husband. TG soft, elastomull to toes 1-4, comprilan soft 10 cm to knee, 15 cm to thigh from knee, 6 cm foot wrap with figure 8's, 8 cm heel locks and 3 12 cm wraps to thigh. TG soft pulled down over the top.                      PT Short Term Goals - 02/24/21 1123       PT SHORT TERM GOAL #1   Title Pt/husband will be able to perform self manual lymph drainage or report using pump with appropriate sleeve    Time 4    Period Weeks    Status New      PT SHORT TERM GOAL #2   Title Pt/ husband will be able to do self compression bandaging    Time 4    Period Weeks    Status New      PT SHORT TERM GOAL #3   Title Pt will report she knows how to use exercise to help control her lymphedema    Time 4     Period Weeks    Status New               PT Long Term Goals - 02/24/21 1125       PT LONG TERM GOAL #1   Title Pt will report she has or knows how to get appropriate day and nighttime garments to help control her lymphedema    Time 8    Period Weeks    Status New      PT LONG TERM GOAL #2   Title Pt reports she knows how to control her lymphedema at home with lymph drainage manually or with pump, compression and exercise    Time 8    Period Weeks    Status New                   Plan - 03/29/21 1057     Clinical Impression Statement pt removed wraps at home and came in unwrapped but had applied lotion and had wraps all rolled up.  Initiated MLD to the Right LE and had pt practice LN activation, breaths and pathway while therapist performed on Leg. Pt used good technique after cueing. pt and husband shown diagrams and explained importance of stretch.  PT wrapped LE with explanation to husband while performing bandaging. Will measure on Friday. Pt given  a second set of wraps    Personal Factors and Comorbidities Comorbidity 3+    Comorbidities chronic lymphedema, abdominal surgery for cervical cancer removal with 40 lymph nodes removed, right total knee in June 2022.    Examination-Activity Limitations Locomotion Level;Stairs    Stability/Clinical Decision Making Stable/Uncomplicated    Rehab Potential Excellent    PT Frequency 3x / week    PT Duration 8 weeks    PT Treatment/Interventions ADLs/Self Care Home Management;Therapeutic exercise;Patient/family education;Orthotic Fit/Training;Manual techniques;Manual lymph drainage;Compression bandaging;Scar mobilization;Taping;Passive range of motion    PT Next Visit Plan send demographics for 2023 insurance benefits, perform and teach manual lymph drainage and compression bandaging, reinforce exercise, help pt get appropriate day and night garments, consider helping pt explore getting a different sleeve for pump that will  provide abdominal decongestion    Consulted and Agree with Plan of Care Patient;Family member/caregiver    Family Member Consulted husband  Patient will benefit from skilled therapeutic intervention in order to improve the following deficits and impairments:     Visit Diagnosis: Lymphedema, not elsewhere classified     Problem List Patient Active Problem List   Diagnosis Date Noted   Encounter for preoperative examination for general surgical procedure 08/18/2020   Insomnia due to anxiety and fear 08/18/2020   Stress at home- of MOM who is ETOH abuse 06/17/2019   Hyperlipidemia 06/17/2019   HLD (hyperlipidemia)    Obesity 12/05/2018   Vitamin D deficiency 12/05/2018   Postmenopausal 12/05/2018   Family history of diabetes mellitus in father 12/05/2018   Impaired fasting glucose 12/05/2018   Small cell carcinoma ([place])- of the cervix 12/05/2018   Insect bite 09/17/2018   Healthcare maintenance 09/17/2018   Generalized muscle ache 05/15/2018   Hip pain, bilateral 05/15/2018   Chronic pain of both shoulders 05/15/2018   Essential hypertension 01/29/2018   Insomnia 01/29/2018   Attention deficit hyperactivity disorder (ADHD) 01/29/2018   Mood disorder - mixed depression and anxiety 01/29/2018   Excessive drinking of alcohol 01/29/2018   Encounter for long-term (current) use of high-risk medication 01/29/2018   Inactivity 01/29/2018   Health education/counseling 01/29/2018   Chronic anticoagulation 10/06/2016   Paroxysmal atrial fibrillation ([facility]) 10/05/2016   Mitral valve disease    PAF (paroxysmal atrial fibrillation) ([place]) 07/01/2013   LAFB (left anterior fascicular block) 07/01/2013   Lymphedema 07/01/2013   Chest pain at rest 10/01/2011   Leg swelling 10/01/2011   h/o Cervical cancer 10/01/2011   H/O total hysterectomy 10/01/2011    Claris Pong, PT 03/29/2021, 12:02 PM  [facility] @ [facility], [place], [address] Phone: [phone]   Fax:  [phone]  Name: CHARNISE LOVAN MRN: [id number] Date of Birth: [birth year]

## 2021-03-31 ENCOUNTER — Other Ambulatory Visit: Payer: Self-pay

## 2021-03-31 ENCOUNTER — Ambulatory Visit: Payer: Medicare Other

## 2021-03-31 DIAGNOSIS — I890 Lymphedema, not elsewhere classified: Secondary | ICD-10-CM

## 2021-03-31 NOTE — Therapy (Signed)
[facility] @ [place], [place], [address] Phone: [phone]   Fax:  [phone]  Physical Therapy Treatment  Patient Details  Name: Savannah Hill MRN: [id number] Date of Birth: [birth year] Referring Provider (PT): Dr. Megan Salon   Encounter Date: 03/31/2021   PT End of Session - 03/31/21 1226     Visit Number 5    Number of Visits 25    Date for PT Re-Evaluation 05/17/21    PT Start Time 1102    PT Stop Time 1210    PT Time Calculation (min) 68 min    Activity Tolerance Patient tolerated treatment well    Behavior During Therapy [facility] for tasks assessed/performed             Past Medical History:  Diagnosis Date   Anemia    borderline   Atrial fibrillation ([place])    a. s/p ablation on 10/05/2016   Cancer ([place])    cervical/rad hysterectomy/bso wiht chemo for small cell ca   Dyspnea    Heart valve disorder    HLD (hyperlipidemia)    Hypertension    Joint pain    Lower extremity edema    Obesity    Palpitations     Past Surgical History:  Procedure Laterality Date   ABLATION OF DYSRHYTHMIC FOCUS  10/05/2016   ATRIAL FIBRILLATION ABLATION N/A 10/05/2016   Procedure: Atrial Fibrillation Ablation;  Surgeon: Constance Haw, MD;  Location: [place] [facility];  Service: Cardiovascular;  Laterality: N/A;   IR RADIOLOGY PERIPHERAL GUIDED IV START  09/28/2016   IR US GUIDE VASC ACCESS RIGHT  09/28/2016   RADICAL ABDOMINAL HYSTERECTOMY  1986   with BSO   REPLACEMENT TOTAL KNEE Right 2022   TEE WITHOUT CARDIOVERSION N/A 06/07/2016   Procedure: TRANSESOPHAGEAL ECHOCARDIOGRAM (TEE);  Surgeon: Sanda Klein, MD;  Location: [facility] ENDOSCOPY;  Service: Cardiovascular;  Laterality: N/A;    There were no vitals filed for this visit.   Subjective Assessment - 03/31/21 1102     Subjective The wraps were fine but they are uncomfortable. something balled up under my foot and that bothered me.     Pertinent History radical hysterectomy in 1986 for small cell cervical endocrine cancer with 40 pelvic lymph nodes removed with chemo, no radiation.  She developed some swelling after the procedure but has noticed and increase as she has gotten older and with some weight gain.  She has a totat knee  June 2022 and has had an increase in lymphedema since.   She has an extremity  pump from [employer] that she got in June. She says she feel some fullness in her lower abdomen on the left side.  She has also used compression stockings. She had recent removal of basal cell carcinoma on left knee    Patient Stated Goals to be able to get her lymphedema under control    Currently in Pain? No/denies    Multiple Pain Sites No                   LYMPHEDEMA/ONCOLOGY QUESTIONNAIRE - 03/31/21 0001       Right Lower Extremity Lymphedema   20 cm Proximal to Suprapatella 67.5 cm    10 cm Proximal to Suprapatella 58.5 cm    At Midpatella/Popliteal Crease 45.4 cm    30 cm Proximal to Floor at Lateral Plantar Foot 48.2 cm    20 cm Proximal to Floor at Lateral Plantar  Foot 42.7 1    10  cm Proximal to Floor at Lateral Malleoli 31.5 cm    5 cm Proximal to 1st MTP Joint 23.2 cm    Around Proximal Great Toe 8 cm                        [facility] Adult PT Treatment/Exercise - 03/31/21 0001       Manual Therapy   Manual therapy comments Pt remeasured with good result    Manual Lymphatic Drainage (MLD) continued MLD to short neck, Right axillary and inguinal LN's, 5 breaths, right inguino-axillary pathway, right lateral thigh, then medial to lateral and lateral thigh retracing pathway, then right lower leg front and back and ankle/foot,retracing steps and ending with LN's.    Compression Bandaging Pt wrapped by therapist with explanation to husband. TG soft, elastomull to toes 1-2, comprilan soft 10 cm to knee, 15 cm to thigh from knee, 6 cm foot wrap with figure 8's, pts husband did remaining wraps10 cm  heel locks and 8 cm wrap to knee 12 cm wrap knee to thigh and 12 cm wrap mid leg to thigh. TG soft folded over.                       PT Short Term Goals - 02/24/21 1123       PT SHORT TERM GOAL #1   Title Pt/husband will be able to perform self manual lymph drainage or report using pump with appropriate sleeve    Time 4    Period Weeks    Status New      PT SHORT TERM GOAL #2   Title Pt/ husband will be able to do self compression bandaging    Time 4    Period Weeks    Status New      PT SHORT TERM GOAL #3   Title Pt will report she knows how to use exercise to help control her lymphedema    Time 4    Period Weeks    Status New               PT Long Term Goals - 02/24/21 1125       PT LONG TERM GOAL #1   Title Pt will report she has or knows how to get appropriate day and nighttime garments to help control her lymphedema    Time 8    Period Weeks    Status New      PT LONG TERM GOAL #2   Title Pt reports she knows how to control her lymphedema at home with lymph drainage manually or with pump, compression and exercise    Time 8    Period Weeks    Status New                   Plan - 03/31/21 1227     Clinical Impression Statement Pt showered before coming and came in unwrapped.  Pt was remeasured. Dorsum of foot and ankle very swollen, but very good results from 20 cm above floor to thigh with 2- 3 cm of reduction throughout. Performed MLD and compression bandaging. Pts husband did wrapping from heel locks to thigh and did exceptionally well requiring very little VC. he will wrap her on Sunday morning and she will come in wrapped so I can see how he does.  Reminded pt that the wrap is always removed if painful, numbness, tingling or  sliding and staying behind the knee to avoid constriction    Personal Factors and Comorbidities Comorbidity 3+    Comorbidities chronic lymphedema, abdominal surgery for cervical cancer removal with 40 lymph nodes  removed, right total knee in June 2022.    Examination-Activity Limitations Locomotion Level;Stairs    Stability/Clinical Decision Making Stable/Uncomplicated    Rehab Potential Excellent    PT Frequency 3x / week    PT Duration 8 weeks    PT Treatment/Interventions ADLs/Self Care Home Management;Therapeutic exercise;Patient/family education;Orthotic Fit/Training;Manual techniques;Manual lymph drainage;Compression bandaging;Scar mobilization;Taping;Passive range of motion    PT Next Visit Plan send demographics for 2023 insurance benefits, perform and teach manual lymph drainage and compression bandaging, reinforce exercise, help pt get appropriate day and night garments, consider helping pt explore getting a different sleeve for pump that will provide abdominal decongestion    Consulted and Agree with Plan of Care Patient;Family member/caregiver    Family Member Consulted husband             Patient will benefit from skilled therapeutic intervention in order to improve the following deficits and impairments:  Increased edema, Decreased knowledge of use of DME, Decreased knowledge of precautions  Visit Diagnosis: Lymphedema, not elsewhere classified     Problem List Patient Active Problem List   Diagnosis Date Noted   Encounter for preoperative examination for general surgical procedure 08/18/2020   Insomnia due to anxiety and fear 08/18/2020   Stress at home- of MOM who is ETOH abuse 06/17/2019   Hyperlipidemia 06/17/2019   HLD (hyperlipidemia)    Obesity 12/05/2018   Vitamin D deficiency 12/05/2018   Postmenopausal 12/05/2018   Family history of diabetes mellitus in father 12/05/2018   Impaired fasting glucose 12/05/2018   Small cell carcinoma ([place])- of the cervix 12/05/2018   Insect bite 09/17/2018   Healthcare maintenance 09/17/2018   Generalized muscle ache 05/15/2018   Hip pain, bilateral 05/15/2018   Chronic pain of both shoulders 05/15/2018   Essential hypertension  01/29/2018   Insomnia 01/29/2018   Attention deficit hyperactivity disorder (ADHD) 01/29/2018   Mood disorder - mixed depression and anxiety 01/29/2018   Excessive drinking of alcohol 01/29/2018   Encounter for long-term (current) use of high-risk medication 01/29/2018   Inactivity 01/29/2018   Health education/counseling 01/29/2018   Chronic anticoagulation 10/06/2016   Paroxysmal atrial fibrillation ([place]) 10/05/2016   Mitral valve disease    PAF (paroxysmal atrial fibrillation) ([place]) 07/01/2013   LAFB (left anterior fascicular block) 07/01/2013   Lymphedema 07/01/2013   Chest pain at rest 10/01/2011   Leg swelling 10/01/2011   h/o Cervical cancer 10/01/2011   H/O total hysterectomy 10/01/2011    Claris Pong, PT 03/31/2021, 12:31 PM  [facility] @ [place], [place], [address] Phone: [phone]   Fax:  [phone]  Name: Savannah Hill MRN: [id number] Date of Birth: 08-15-1950

## 2021-04-03 ENCOUNTER — Other Ambulatory Visit: Payer: Self-pay

## 2021-04-03 ENCOUNTER — Ambulatory Visit: Payer: Medicare Other

## 2021-04-03 DIAGNOSIS — I890 Lymphedema, not elsewhere classified: Secondary | ICD-10-CM

## 2021-04-03 NOTE — Therapy (Signed)
[facility] @ [place], [place], [address] Phone: [phone]   Fax:  [phone]  Physical Therapy Treatment  Patient Details  Name: Savannah Hill MRN: [id number] Date of Birth: 1951-03-14 Referring Provider (PT): Dr. Megan Salon   Encounter Date: 04/03/2021   PT End of Session - 04/03/21 1205     Visit Number 6    Number of Visits 25    Date for PT Re-Evaluation 05/17/21    PT Start Time 1105    PT Stop Time 1157    PT Time Calculation (min) 52 min    Activity Tolerance Patient tolerated treatment well             Past Medical History:  Diagnosis Date   Anemia    borderline   Atrial fibrillation ([place])    a. s/p ablation on 10/05/2016   Cancer ([place])    cervical/rad hysterectomy/bso wiht chemo for small cell ca   Dyspnea    Heart valve disorder    HLD (hyperlipidemia)    Hypertension    Joint pain    Lower extremity edema    Obesity    Palpitations     Past Surgical History:  Procedure Laterality Date   ABLATION OF DYSRHYTHMIC FOCUS  10/05/2016   ATRIAL FIBRILLATION ABLATION N/A 10/05/2016   Procedure: Atrial Fibrillation Ablation;  Surgeon: Constance Haw, MD;  Location: [facility];  Service: Cardiovascular;  Laterality: N/A;   IR RADIOLOGY PERIPHERAL GUIDED IV START  09/28/2016   IR US GUIDE VASC ACCESS RIGHT  09/28/2016   RADICAL ABDOMINAL HYSTERECTOMY  1986   with BSO   REPLACEMENT TOTAL KNEE Right 2022   TEE WITHOUT CARDIOVERSION N/A 06/07/2016   Procedure: TRANSESOPHAGEAL ECHOCARDIOGRAM (TEE);  Surgeon: Sanda Klein, MD;  Location: [facility] ENDOSCOPY;  Service: Cardiovascular;  Laterality: N/A;    There were no vitals filed for this visit.   Subjective Assessment - 04/03/21 1107     Subjective We wrapped yesterday morning on the way to church but she started to have pain so we took it off and redid it when I got home. The second one was better.    Pertinent  History radical hysterectomy in 1986 for small cell cervical endocrine cancer with 40 pelvic lymph nodes removed with chemo, no radiation.  She developed some swelling after the procedure but has noticed and increase as she has gotten older and with some weight gain.  She has a totat knee  June 2022 and has had an increase in lymphedema since.   She has an extremity  pump from [employer] that she got in June. She says she feel some fullness in her lower abdomen on the left side.  She has also used compression stockings. She had recent removal of basal cell carcinoma on left knee    Patient Stated Goals to be able to get her lymphedema under control    Currently in Pain? No/denies                               [facility] Adult PT Treatment/Exercise - 04/03/21 0001       Manual Therapy   Manual therapy comments pts wrap done by husband assessed.  Had some difficulty with toe wraps and not enough wrap at base of toes causing dorsal foot swelling but otherwise very good result at lower leg.  Wrap had slid at  upper leg.    Manual Lymphatic Drainage (MLD) continued MLD to short neck, Right axillary and inguinal LN's, 5 breaths, right inguino-axillary pathway, right lateral thigh, then medial to lateral and lateral thigh retracing pathway, then right lower leg front and back retracing steps and ending with LN's. pt performed inguinal-axillary pathway.   Compression Bandaging Pt wrapped by therapist with explanation to husband. TG soft, elastomull to toes 1-2, comprilan soft 10 cm to knee, 15 cm to thigh from knee, 6 cm foot wrap with figure 8's, 8 cm heel locks and 3 12 cm wraps to thigh.                       PT Short Term Goals - 02/24/21 1123       PT SHORT TERM GOAL #1   Title Pt/husband will be able to perform self manual lymph drainage or report using pump with appropriate sleeve    Time 4    Period Weeks    Status New      PT SHORT TERM GOAL #2   Title Pt/ husband  will be able to do self compression bandaging    Time 4    Period Weeks    Status New      PT SHORT TERM GOAL #3   Title Pt will report she knows how to use exercise to help control her lymphedema    Time 4    Period Weeks    Status New               PT Long Term Goals - 02/24/21 1125       PT LONG TERM GOAL #1   Title Pt will report she has or knows how to get appropriate day and nighttime garments to help control her lymphedema    Time 8    Period Weeks    Status New      PT LONG TERM GOAL #2   Title Pt reports she knows how to control her lymphedema at home with lymph drainage manually or with pump, compression and exercise    Time 8    Period Weeks    Status New                   Plan - 04/03/21 1205     Clinical Impression Statement pts husband wrapped on Sunday and pt came in wrapped.  Wrap removed and leg assessed.  Swelling at dorsum of foot because not enough pressure at base of toes, but wrap to knee was excellent and leg is visibly reduced and much softer.  Thigh wrap had come loose and had slid down, but overall a good job for first attempt wrapping independently. continued MLD and had pt perform inguino-axillary pathway.  Therapist did all wrapping today.    Personal Factors and Comorbidities Comorbidity 3+    Comorbidities chronic lymphedema, abdominal surgery for cervical cancer removal with 40 lymph nodes removed, right total knee in June 2022.    Stability/Clinical Decision Making Stable/Uncomplicated    Rehab Potential Excellent    PT Frequency 3x / week    PT Duration 8 weeks    PT Treatment/Interventions ADLs/Self Care Home Management;Therapeutic exercise;Patient/family education;Orthotic Fit/Training;Manual techniques;Manual lymph drainage;Compression bandaging;Scar mobilization;Taping;Passive range of motion    PT Next Visit Plan send demographics for 2023 insurance benefits, perform and teach manual lymph drainage and compression bandaging,  reinforce exercise, help pt get appropriate day and night garments, consider helping pt explore  getting a different sleeve for pump that will provide abdominal decongestion    Consulted and Agree with Plan of Care Patient;Family member/caregiver    Family Member Consulted husband             Patient will benefit from skilled therapeutic intervention in order to improve the following deficits and impairments:  Increased edema, Decreased knowledge of use of DME, Decreased knowledge of precautions  Visit Diagnosis: Lymphedema, not elsewhere classified     Problem List Patient Active Problem List   Diagnosis Date Noted   Encounter for preoperative examination for general surgical procedure 08/18/2020   Insomnia due to anxiety and fear 08/18/2020   Stress at home- of MOM who is ETOH abuse 06/17/2019   Hyperlipidemia 06/17/2019   HLD (hyperlipidemia)    Obesity 12/05/2018   Vitamin D deficiency 12/05/2018   Postmenopausal 12/05/2018   Family history of diabetes mellitus in father 12/05/2018   Impaired fasting glucose 12/05/2018   Small cell carcinoma ([place])- of the cervix 12/05/2018   Insect bite 09/17/2018   Healthcare maintenance 09/17/2018   Generalized muscle ache 05/15/2018   Hip pain, bilateral 05/15/2018   Chronic pain of both shoulders 05/15/2018   Essential hypertension 01/29/2018   Insomnia 01/29/2018   Attention deficit hyperactivity disorder (ADHD) 01/29/2018   Mood disorder - mixed depression and anxiety 01/29/2018   Excessive drinking of alcohol 01/29/2018   Encounter for long-term (current) use of high-risk medication 01/29/2018   Inactivity 01/29/2018   Health education/counseling 01/29/2018   Chronic anticoagulation 10/06/2016   Paroxysmal atrial fibrillation ([place]) 10/05/2016   Mitral valve disease    PAF (paroxysmal atrial fibrillation) ([place]) 07/01/2013   LAFB (left anterior fascicular block) 07/01/2013   Lymphedema 07/01/2013   Chest pain at rest  10/01/2011   Leg swelling 10/01/2011   h/o Cervical cancer 10/01/2011   H/O total hysterectomy 10/01/2011    Claris Pong, PT 04/03/2021, 12:08 PM  [facility] @ [place], [place], [address] Phone: [phone]   Fax:  [phone]  Name: Savannah Hill MRN: [id number] Date of Birth: 17-Jun-1950

## 2021-04-05 ENCOUNTER — Other Ambulatory Visit: Payer: Self-pay

## 2021-04-05 ENCOUNTER — Ambulatory Visit: Payer: Medicare Other

## 2021-04-05 DIAGNOSIS — I890 Lymphedema, not elsewhere classified: Secondary | ICD-10-CM

## 2021-04-05 NOTE — Therapy (Signed)
[facility] @ [place], [place], [address] Phone: [phone]   Fax:  [phone]  Physical Therapy Treatment  Patient Details  Name: Savannah Hill MRN: [id number] Date of Birth: [birth year] Referring Provider (PT): Dr. Megan Salon   Encounter Date: 04/05/2021   PT End of Session - 04/05/21 1103     Visit Number 7    Number of Visits 25    Date for PT Re-Evaluation 05/17/21    PT Start Time 1105    PT Stop Time 1157    PT Time Calculation (min) 52 min    Activity Tolerance Patient tolerated treatment well    Behavior During Therapy [facility] for tasks assessed/performed             Past Medical History:  Diagnosis Date   Anemia    borderline   Atrial fibrillation ([place])    a. s/p ablation on 10/05/2016   Cancer ([place])    cervical/rad hysterectomy/bso wiht chemo for small cell ca   Dyspnea    Heart valve disorder    HLD (hyperlipidemia)    Hypertension    Joint pain    Lower extremity edema    Obesity    Palpitations     Past Surgical History:  Procedure Laterality Date   ABLATION OF DYSRHYTHMIC FOCUS  10/05/2016   ATRIAL FIBRILLATION ABLATION N/A 10/05/2016   Procedure: Atrial Fibrillation Ablation;  Surgeon: Constance Haw, MD;  Location: [place] [facility];  Service: Cardiovascular;  Laterality: N/A;   IR RADIOLOGY PERIPHERAL GUIDED IV START  09/28/2016   IR US GUIDE VASC ACCESS RIGHT  09/28/2016   RADICAL ABDOMINAL HYSTERECTOMY  1986   with BSO   REPLACEMENT TOTAL KNEE Right 2022   TEE WITHOUT CARDIOVERSION N/A 06/07/2016   Procedure: TRANSESOPHAGEAL ECHOCARDIOGRAM (TEE);  Surgeon: Sanda Klein, MD;  Location: [facility] ENDOSCOPY;  Service: Cardiovascular;  Laterality: N/A;    There were no vitals filed for this visit.   Subjective Assessment - 04/05/21 1102     Subjective Took wraps off this am about 8:30 so I could take a shower.  There was a little irritation behind the knee that  was itchy.    Pertinent History radical hysterectomy in 1986 for small cell cervical endocrine cancer with 40 pelvic lymph nodes removed with chemo, no radiation.  She developed some swelling after the procedure but has noticed and increase as she has gotten older and with some weight gain.  She has a totat knee  June 2022 and has had an increase in lymphedema since.   She has an extremity  pump from [employer] that she got in June. She says she feel some fullness in her lower abdomen on the left side.  She has also used compression stockings. She had recent removal of basal cell carcinoma on left knee    Patient Stated Goals to be able to get her lymphedema under control    Currently in Pain? No/denies                               [facility] Adult PT Treatment/Exercise - 04/05/21 0001       Manual Therapy   Manual therapy comments new TG soft cut , cocoa butter applied behind knee irritation   Manual Lymphatic Drainage (MLD) continued MLD to short neck, Right axillary and inguinal LN's, 5 breaths, right inguino-axillary pathway, right lateral thigh,  then medial to lateral and lateral thigh retracing pathway, then right lower leg front and back retracing steps and ending with LN's. pt performed    Compression Bandaging . TG soft, elastomull to toes 1-2, comprilan soft 10 cm to knee, 15 cm to thigh from knee, 6 cm foot wrap with figure 8's, 8 cm heel locks, 10 cm wrap to knee, and 2 12 cm wraps to thigh.                       PT Short Term Goals - 02/24/21 1123       PT SHORT TERM GOAL #1   Title Pt/husband will be able to perform self manual lymph drainage or report using pump with appropriate sleeve    Time 4    Period Weeks    Status New      PT SHORT TERM GOAL #2   Title Pt/ husband will be able to do self compression bandaging    Time 4    Period Weeks    Status New      PT SHORT TERM GOAL #3   Title Pt will report she knows how to use exercise to help  control her lymphedema    Time 4    Period Weeks    Status New               PT Long Term Goals - 02/24/21 1125       PT LONG TERM GOAL #1   Title Pt will report she has or knows how to get appropriate day and nighttime garments to help control her lymphedema    Time 8    Period Weeks    Status New      PT LONG TERM GOAL #2   Title Pt reports she knows how to control her lymphedema at home with lymph drainage manually or with pump, compression and exercise    Time 8    Period Weeks    Status New                   Plan - 04/05/21 1204     Clinical Impression Statement Pt came in unwrapped after showering this am.  Leg continues to feel much softer and demonstrates good improvement in edema. Continued MLD and bandaging.  Small area of redness behind knee but no skin break down.  Pt advised to remove wraps if posterior knee gets too uncomfortable or to rewrap.  Will measure again on Friday. Discussed pumps and pt in agreement with me sending her info to [employer].    Personal Factors and Comorbidities Comorbidity 3+    Comorbidities chronic lymphedema, abdominal surgery for cervical cancer removal with 40 lymph nodes removed, right total knee in June 2022.    Examination-Activity Limitations Locomotion Level;Stairs    Stability/Clinical Decision Making Stable/Uncomplicated    Rehab Potential Excellent    PT Frequency 3x / week    PT Duration 8 weeks    PT Treatment/Interventions ADLs/Self Care Home Management;Therapeutic exercise;Patient/family education;Orthotic Fit/Training;Manual techniques;Manual lymph drainage;Compression bandaging;Scar mobilization;Taping;Passive range of motion    PT Next Visit Plan send demographics for 2023 insurance benefits, perform and teach manual lymph drainage and compression bandaging, reinforce exercise, help pt get appropriate day and night garments, consider helping pt explore getting a different sleeve for pump that will provide  abdominal decongestion    Consulted and Agree with Plan of Care Patient;Family member/caregiver    Family Member Consulted  husband             Patient will benefit from skilled therapeutic intervention in order to improve the following deficits and impairments:  Increased edema, Decreased knowledge of use of DME, Decreased knowledge of precautions  Visit Diagnosis: Lymphedema, not elsewhere classified     Problem List Patient Active Problem List   Diagnosis Date Noted   Encounter for preoperative examination for general surgical procedure 08/18/2020   Insomnia due to anxiety and fear 08/18/2020   Stress at home- of MOM who is ETOH abuse 06/17/2019   Hyperlipidemia 06/17/2019   HLD (hyperlipidemia)    Obesity 12/05/2018   Vitamin D deficiency 12/05/2018   Postmenopausal 12/05/2018   Family history of diabetes mellitus in father 12/05/2018   Impaired fasting glucose 12/05/2018   Small cell carcinoma ([place])- of the cervix 12/05/2018   Insect bite 09/17/2018   Healthcare maintenance 09/17/2018   Generalized muscle ache 05/15/2018   Hip pain, bilateral 05/15/2018   Chronic pain of both shoulders 05/15/2018   Essential hypertension 01/29/2018   Insomnia 01/29/2018   Attention deficit hyperactivity disorder (ADHD) 01/29/2018   Mood disorder - mixed depression and anxiety 01/29/2018   Excessive drinking of alcohol 01/29/2018   Encounter for long-term (current) use of high-risk medication 01/29/2018   Inactivity 01/29/2018   Health education/counseling 01/29/2018   Chronic anticoagulation 10/06/2016   Paroxysmal atrial fibrillation ([place]) 10/05/2016   Mitral valve disease    PAF (paroxysmal atrial fibrillation) ([place]) 07/01/2013   LAFB (left anterior fascicular block) 07/01/2013   Lymphedema 07/01/2013   Chest pain at rest 10/01/2011   Leg swelling 10/01/2011   h/o Cervical cancer 10/01/2011   H/O total hysterectomy 10/01/2011    Claris Pong, PT 04/05/2021, 12:09  PM  [facility] @ [place], [place], [address] Phone: [phone]   Fax:  [phone]  Name: Savannah Hill MRN: [id number] Date of Birth: 01-May-1950

## 2021-04-07 ENCOUNTER — Ambulatory Visit: Payer: Medicare Other

## 2021-04-07 ENCOUNTER — Other Ambulatory Visit: Payer: Self-pay

## 2021-04-07 DIAGNOSIS — I890 Lymphedema, not elsewhere classified: Secondary | ICD-10-CM

## 2021-04-07 NOTE — Therapy (Signed)
[facility] @ [place], [place], [address] Phone: [phone]   Fax:  [phone]  Physical Therapy Treatment  Patient Details  Name: Savannah Hill MRN: [id number] Date of Birth: 10/14/1950 Referring Provider (PT): Dr. Megan Salon   Encounter Date: 04/07/2021   PT End of Session - 04/07/21 1209     Visit Number 8    Number of Visits 25    Date for PT Re-Evaluation 05/17/21    PT Start Time 1104    PT Stop Time 1200    PT Time Calculation (min) 56 min    Activity Tolerance Patient tolerated treatment well    Behavior During Therapy [facility] for tasks assessed/performed             Past Medical History:  Diagnosis Date   Anemia    borderline   Atrial fibrillation ([place])    a. s/p ablation on 10/05/2016   Cancer ([place])    cervical/rad hysterectomy/bso wiht chemo for small cell ca   Dyspnea    Heart valve disorder    HLD (hyperlipidemia)    Hypertension    Joint pain    Lower extremity edema    Obesity    Palpitations     Past Surgical History:  Procedure Laterality Date   ABLATION OF DYSRHYTHMIC FOCUS  10/05/2016   ATRIAL FIBRILLATION ABLATION N/A 10/05/2016   Procedure: Atrial Fibrillation Ablation;  Surgeon: Constance Haw, MD;  Location: [place] [facility];  Service: Cardiovascular;  Laterality: N/A;   IR RADIOLOGY PERIPHERAL GUIDED IV START  09/28/2016   IR US GUIDE VASC ACCESS RIGHT  09/28/2016   RADICAL ABDOMINAL HYSTERECTOMY  1986   with BSO   REPLACEMENT TOTAL KNEE Right 2022   TEE WITHOUT CARDIOVERSION N/A 06/07/2016   Procedure: TRANSESOPHAGEAL ECHOCARDIOGRAM (TEE);  Surgeon: Sanda Klein, MD;  Location: [facility] ENDOSCOPY;  Service: Cardiovascular;  Laterality: N/A;    There were no vitals filed for this visit.   Subjective Assessment - 04/07/21 1206     Subjective The wraps slid way down at the thigh last night.  We took it off this am to shower. I have a flexi touch demo  next week   Patient is accompained by: Family member    Pertinent History radical hysterectomy in 1986 for small cell cervical endocrine cancer with 40 pelvic lymph nodes removed with chemo, no radiation.  She developed some swelling after the procedure but has noticed and increase as she has gotten older and with some weight gain.  She has a totat knee  June 2022 and has had an increase in lymphedema since.   She has an extremity  pump from [employer] that she got in June. She says she feel some fullness in her lower abdomen on the left side.  She has also used compression stockings. She had recent removal of basal cell carcinoma on left knee    Patient Stated Goals to be able to get her lymphedema under control    Currently in Pain? No/denies    Multiple Pain Sites No                   LYMPHEDEMA/ONCOLOGY QUESTIONNAIRE - 04/07/21 0001       Right Lower Extremity Lymphedema   20 cm Proximal to Suprapatella 65.5 cm    10 cm Proximal to Suprapatella 57.1 cm    At Midpatella/Popliteal Crease 44 cm    30 cm Proximal to Floor  at Lateral Plantar Foot 45.5 cm    20 cm Proximal to Floor at Lateral Plantar Foot 39.1 1    10  cm Proximal to Floor at Lateral Malleoli 30.5 cm    5 cm Proximal to 1st MTP Joint 22.9 cm    Around Proximal Great Toe 7.9 cm                        [facility] Adult PT Treatment/Exercise - 04/07/21 0001       Manual Therapy   Manual therapy comments peach foam cut for top of foot and placed over elastomull and under TG soft before wrapping to decrease edema at dorsum of foot    Manual Lymphatic Drainage (MLD) continued MLD to short neck, Right axillary and inguinal LN's, 5 breaths, right inguino-axillary pathway, right lateral thigh, then medial to lateral and lateral thigh retracing pathway, then right lower leg front and back retracing steps and ending with LN's. pt performed    Compression Bandaging . TG soft, elastomull to toes 1-4, comprilan soft 10 cm to  knee, 15 cm to thigh from knee, 6 cm foot wrap with figure 8's, 8 cm heel locks, 10 cm wrap to knee, and 2 12 cm wraps to thigh, 1st with figure 8, then 12 cm wrap below knee to thigh                       PT Short Term Goals - 02/24/21 1123       PT SHORT TERM GOAL #1   Title Pt/husband will be able to perform self manual lymph drainage or report using pump with appropriate sleeve    Time 4    Period Weeks    Status New      PT SHORT TERM GOAL #2   Title Pt/ husband will be able to do self compression bandaging    Time 4    Period Weeks    Status New      PT SHORT TERM GOAL #3   Title Pt will report she knows how to use exercise to help control her lymphedema    Time 4    Period Weeks    Status New               PT Long Term Goals - 02/24/21 1125       PT LONG TERM GOAL #1   Title Pt will report she has or knows how to get appropriate day and nighttime garments to help control her lymphedema    Time 8    Period Weeks    Status New      PT LONG TERM GOAL #2   Title Pt reports she knows how to control her lymphedema at home with lymph drainage manually or with pump, compression and exercise    Time 8    Period Weeks    Status New                   Plan - 04/07/21 1210     Clinical Impression Statement Pt was remeasured with excellent result.  Edema reduction throughout right LE of nearly 2 inches or more starting at 20 cm proximal to floor and into thigh.Wrap done slightly differently today to try and prevent sliding at thigh. Also added peach foam to dorsum of foot to decrease edema there    Personal Factors and Comorbidities Comorbidity 3+    Comorbidities chronic lymphedema, abdominal  surgery for cervical cancer removal with 40 lymph nodes removed, right total knee in June 2022.    Examination-Activity Limitations Locomotion Level;Stairs    Stability/Clinical Decision Making Stable/Uncomplicated    Rehab Potential Excellent    PT  Frequency 3x / week    PT Duration 8 weeks    PT Treatment/Interventions ADLs/Self Care Home Management;Therapeutic exercise;Patient/family education;Orthotic Fit/Training;Manual techniques;Manual lymph drainage;Compression bandaging;Scar mobilization;Taping;Passive range of motion    PT Next Visit Plan , perform and teach manual lymph drainage and compression bandaging, reinforce exercise, help pt get appropriate day and night garments, consider helping pt explore getting a different sleeve for pump that will provide abdominal decongestion    Recommended Other Services having flexi demo next week    Consulted and Agree with Plan of Care Patient;Family member/caregiver    Family Member Consulted husband             Patient will benefit from skilled therapeutic intervention in order to improve the following deficits and impairments:  Increased edema, Decreased knowledge of use of DME, Decreased knowledge of precautions  Visit Diagnosis: Lymphedema, not elsewhere classified     Problem List Patient Active Problem List   Diagnosis Date Noted   Encounter for preoperative examination for general surgical procedure 08/18/2020   Insomnia due to anxiety and fear 08/18/2020   Stress at home- of MOM who is ETOH abuse 06/17/2019   Hyperlipidemia 06/17/2019   HLD (hyperlipidemia)    Obesity 12/05/2018   Vitamin D deficiency 12/05/2018   Postmenopausal 12/05/2018   Family history of diabetes mellitus in father 12/05/2018   Impaired fasting glucose 12/05/2018   Small cell carcinoma ([place])- of the cervix 12/05/2018   Insect bite 09/17/2018   Healthcare maintenance 09/17/2018   Generalized muscle ache 05/15/2018   Hip pain, bilateral 05/15/2018   Chronic pain of both shoulders 05/15/2018   Essential hypertension 01/29/2018   Insomnia 01/29/2018   Attention deficit hyperactivity disorder (ADHD) 01/29/2018   Mood disorder - mixed depression and anxiety 01/29/2018   Excessive drinking of  alcohol 01/29/2018   Encounter for long-term (current) use of high-risk medication 01/29/2018   Inactivity 01/29/2018   Health education/counseling 01/29/2018   Chronic anticoagulation 10/06/2016   Paroxysmal atrial fibrillation ([place]) 10/05/2016   Mitral valve disease    PAF (paroxysmal atrial fibrillation) ([place]) 07/01/2013   LAFB (left anterior fascicular block) 07/01/2013   Lymphedema 07/01/2013   Chest pain at rest 10/01/2011   Leg swelling 10/01/2011   h/o Cervical cancer 10/01/2011   H/O total hysterectomy 10/01/2011    Claris Pong, PT 04/07/2021, 12:16 PM  [facility] @ [place], [place], [address] Phone: [phone]   Fax:  [phone]  Name: Savannah Hill MRN: [id number] Date of Birth: 01-Feb-1951

## 2021-04-10 ENCOUNTER — Other Ambulatory Visit: Payer: Self-pay

## 2021-04-10 ENCOUNTER — Ambulatory Visit: Payer: Medicare Other

## 2021-04-10 DIAGNOSIS — I890 Lymphedema, not elsewhere classified: Secondary | ICD-10-CM | POA: Diagnosis not present

## 2021-04-10 NOTE — Therapy (Signed)
[facility] @ [place], [place], [address] Phone: [phone]   Fax:  [phone]  Physical Therapy Treatment  Patient Details  Name: Savannah Hill MRN: [id number] Date of Birth: November 04, 1950 Referring Provider (PT): Dr. Megan Salon   Encounter Date: 04/10/2021   PT End of Session - 04/10/21 1059     Visit Number 9    Number of Visits 25    Date for PT Re-Evaluation 05/17/21    PT Start Time 1102    PT Stop Time 1205    PT Time Calculation (min) 63 min    Activity Tolerance Patient tolerated treatment well    Behavior During Therapy [facility] for tasks assessed/performed             Past Medical History:  Diagnosis Date   Anemia    borderline   Atrial fibrillation ([place])    a. s/p ablation on 10/05/2016   Cancer ([place])    cervical/rad hysterectomy/bso wiht chemo for small cell ca   Dyspnea    Heart valve disorder    HLD (hyperlipidemia)    Hypertension    Joint pain    Lower extremity edema    Obesity    Palpitations     Past Surgical History:  Procedure Laterality Date   ABLATION OF DYSRHYTHMIC FOCUS  10/05/2016   ATRIAL FIBRILLATION ABLATION N/A 10/05/2016   Procedure: Atrial Fibrillation Ablation;  Surgeon: Constance Haw, MD;  Location: [place] [facility];  Service: Cardiovascular;  Laterality: N/A;   IR RADIOLOGY PERIPHERAL GUIDED IV START  09/28/2016   IR US GUIDE VASC ACCESS RIGHT  09/28/2016   RADICAL ABDOMINAL HYSTERECTOMY  1986   with BSO   REPLACEMENT TOTAL KNEE Right 2022   TEE WITHOUT CARDIOVERSION N/A 06/07/2016   Procedure: TRANSESOPHAGEAL ECHOCARDIOGRAM (TEE);  Surgeon: Sanda Klein, MD;  Location: [facility] ENDOSCOPY;  Service: Cardiovascular;  Laterality: N/A;    There were no vitals filed for this visit.   Subjective Assessment - 04/10/21 1059     Subjective Pts husband redid the wrap on Sunday morning.  It took 45 min but he felt good about it, and she felt very  comfortable in it.    Pertinent History radical hysterectomy in 1986 for small cell cervical endocrine cancer with 40 pelvic lymph nodes removed with chemo, no radiation.  She developed some swelling after the procedure but has noticed and increase as she has gotten older and with some weight gain.  She has a totat knee  June 2022 and has had an increase in lymphedema since.   She has an extremity  pump from [employer] that she got in June. She says she feel some fullness in her lower abdomen on the left side.  She has also used compression stockings. She had recent removal of basal cell carcinoma on left knee    Patient Stated Goals to be able to get her lymphedema under control    Currently in Pain? No/denies    Multiple Pain Sites No                               [facility] Adult PT Treatment/Exercise - 04/10/21 0001       Manual Therapy   Manual therapy comments Discussed night garments, stockings, donning devices    Manual Lymphatic Drainage (MLD) continued MLD to short neck, Right axillary and inguinal LN's, 5 breaths, right inguino-axillary  pathway, right lateral thigh, then medial to lateral and lateral thigh retracing pathway, then right lower leg front and back retracing steps and ending with LN''s   Compression Bandaging . TG soft, elastomull to toes 1-4, comprilan soft 10 cm to knee, 15 cm to thigh from knee, 6 cm foot wrap with figure 8's, 8 cm heel locks, 10 cm wrap to knee, and 1 12 cm  mid leg to thigh, 1 12 cm wrap knee to thigh, 1 12 cm above knee to thigh.                      PT Short Term Goals - 02/24/21 1123       PT SHORT TERM GOAL #1   Title Pt/husband will be able to perform self manual lymph drainage or report using pump with appropriate sleeve    Time 4    Period Weeks    Status New      PT SHORT TERM GOAL #2   Title Pt/ husband will be able to do self compression bandaging    Time 4    Period Weeks    Status New      PT SHORT TERM  GOAL #3   Title Pt will report she knows how to use exercise to help control her lymphedema    Time 4    Period Weeks    Status New               PT Long Term Goals - 02/24/21 1125       PT LONG TERM GOAL #1   Title Pt will report she has or knows how to get appropriate day and nighttime garments to help control her lymphedema    Time 8    Period Weeks    Status New      PT LONG TERM GOAL #2   Title Pt reports she knows how to control her lymphedema at home with lymph drainage manually or with pump, compression and exercise    Time 8    Period Weeks    Status New                   Plan - 04/10/21 1059     Clinical Impression Statement continued MLD and compression bandaging.  Pts leg continues to have much better texture and visibly improved swelling.  Discussed stockings and night garments. Pt will get on Compression guru to look at options. pt purchases stockings now in [place] and are probably class 1. She will bring a new pair so we can check.    Personal Factors and Comorbidities Comorbidity 3+    Comorbidities chronic lymphedema, abdominal surgery for cervical cancer removal with 40 lymph nodes removed, right total knee in June 2022.    Examination-Activity Limitations Locomotion Level;Stairs    Stability/Clinical Decision Making Stable/Uncomplicated    Rehab Potential Excellent    PT Frequency 3x / week    PT Duration 8 weeks    PT Treatment/Interventions ADLs/Self Care Home Management;Therapeutic exercise;Patient/family education;Orthotic Fit/Training;Manual techniques;Manual lymph drainage;Compression bandaging;Scar mobilization;Taping;Passive range of motion    PT Next Visit Plan , perform and teach manual lymph drainage and compression bandaging, reinforce exercise, help pt get appropriate day and night garments, consider helping pt explore getting a different sleeve for pump that will provide abdominal decongestion    Consulted and Agree with Plan of  Care Patient;Family member/caregiver  Patient will benefit from skilled therapeutic intervention in order to improve the following deficits and impairments:  Increased edema, Decreased knowledge of use of DME, Decreased knowledge of precautions  Visit Diagnosis: Lymphedema, not elsewhere classified     Problem List Patient Active Problem List   Diagnosis Date Noted   Encounter for preoperative examination for general surgical procedure 08/18/2020   Insomnia due to anxiety and fear 08/18/2020   Stress at home- of MOM who is ETOH abuse 06/17/2019   Hyperlipidemia 06/17/2019   HLD (hyperlipidemia)    Obesity 12/05/2018   Vitamin D deficiency 12/05/2018   Postmenopausal 12/05/2018   Family history of diabetes mellitus in father 12/05/2018   Impaired fasting glucose 12/05/2018   Small cell carcinoma ([place])- of the cervix 12/05/2018   Insect bite 09/17/2018   Healthcare maintenance 09/17/2018   Generalized muscle ache 05/15/2018   Hip pain, bilateral 05/15/2018   Chronic pain of both shoulders 05/15/2018   Essential hypertension 01/29/2018   Insomnia 01/29/2018   Attention deficit hyperactivity disorder (ADHD) 01/29/2018   Mood disorder - mixed depression and anxiety 01/29/2018   Excessive drinking of alcohol 01/29/2018   Encounter for long-term (current) use of high-risk medication 01/29/2018   Inactivity 01/29/2018   Health education/counseling 01/29/2018   Chronic anticoagulation 10/06/2016   Paroxysmal atrial fibrillation ([facility]) 10/05/2016   Mitral valve disease    PAF (paroxysmal atrial fibrillation) ([place]) 07/01/2013   LAFB (left anterior fascicular block) 07/01/2013   Lymphedema 07/01/2013   Chest pain at rest 10/01/2011   Leg swelling 10/01/2011   h/o Cervical cancer 10/01/2011   H/O total hysterectomy 10/01/2011    Claris Pong, PT 04/10/2021, 12:12 PM  [facility] @ [place], [place], [address] Phone: [phone]   Fax:  [phone]  Name: Savannah Hill MRN: [id number] Date of Birth: 10/10/1950

## 2021-04-12 ENCOUNTER — Other Ambulatory Visit: Payer: Self-pay

## 2021-04-12 ENCOUNTER — Ambulatory Visit: Payer: Medicare Other

## 2021-04-12 DIAGNOSIS — I890 Lymphedema, not elsewhere classified: Secondary | ICD-10-CM | POA: Diagnosis not present

## 2021-04-12 NOTE — Therapy (Addendum)
Progress Note Reporting Period 02/24/2021 to 04/12/2021  See note below for Objective Data and Assessment of Progress/Goals.      [place] @ [place], [place], [address] Phone: [phone]   Fax:  [phone]  Physical Therapy Treatment  Patient Details  Name: Savannah Hill MRN: [id number] Date of Birth: 06-24-1950 Referring Provider (PT): Dr. Megan Salon   Encounter Date: 04/12/2021   PT End of Session - 04/12/21 1109     Visit Number 10    Number of Visits 25    Date for PT Re-Evaluation 05/17/21    PT Start Time 1103    PT Stop Time 1202    PT Time Calculation (min) 59 min    Activity Tolerance Patient tolerated treatment well    Behavior During Therapy [facility] for tasks assessed/performed             Past Medical History:  Diagnosis Date   Anemia    borderline   Atrial fibrillation ([place])    a. s/p ablation on 10/05/2016   Cancer ([place])    cervical/rad hysterectomy/bso wiht chemo for small cell ca   Dyspnea    Heart valve disorder    HLD (hyperlipidemia)    Hypertension    Joint pain    Lower extremity edema    Obesity    Palpitations     Past Surgical History:  Procedure Laterality Date   ABLATION OF DYSRHYTHMIC FOCUS  10/05/2016   ATRIAL FIBRILLATION ABLATION N/A 10/05/2016   Procedure: Atrial Fibrillation Ablation;  Surgeon: Constance Haw, MD;  Location: [place] [place] [facility];  Service: Cardiovascular;  Laterality: N/A;   IR RADIOLOGY PERIPHERAL GUIDED IV START  09/28/2016   IR US GUIDE VASC ACCESS RIGHT  09/28/2016   RADICAL ABDOMINAL HYSTERECTOMY  1986   with BSO   REPLACEMENT TOTAL KNEE Right 2022   TEE WITHOUT CARDIOVERSION N/A 06/07/2016   Procedure: TRANSESOPHAGEAL ECHOCARDIOGRAM (TEE);  Surgeon: Sanda Klein, MD;  Location: [facility] ENDOSCOPY;  Service: Cardiovascular;  Laterality: N/A;    There were no vitals filed for this visit.   Subjective Assessment -  04/12/21 1106     Subjective took the wraps off today at 9:30.  Husband is home sick with Covid.    Pertinent History radical hysterectomy in 1986 for small cell cervical endocrine cancer with 40 pelvic lymph nodes removed with chemo, no radiation.  She developed some swelling after the procedure but has noticed and increase as she has gotten older and with some weight gain.  She has a totat knee  June 2022 and has had an increase in lymphedema since.   She has an extremity  pump from [employer] that she got in June. She says she feel some fullness in her lower abdomen on the left side.  She has also used compression stockings. She had recent removal of basal cell carcinoma on left knee    Patient Stated Goals to be able to get her lymphedema under control    Currently in Pain? No/denies    Multiple Pain Sites No                                          PT Short Term Goals - 02/24/21 1123       PT SHORT TERM GOAL #1   Title Pt/husband will be able  to perform self manual lymph drainage or report using pump with appropriate sleeve    Time 4    Period Weeks    Status New      PT SHORT TERM GOAL #2   Title Pt/ husband will be able to do self compression bandaging    Time 4    Period Weeks    Status Achieved     PT SHORT TERM GOAL #3   Title Pt will report she knows how to use exercise to help control her lymphedema    Time 4    Period Weeks    Status New               PT Long Term Goals - 02/24/21 1125       PT LONG TERM GOAL #1   Title Pt will report she has or knows how to get appropriate day and nighttime garments to help control her lymphedema    Time 8    Period Weeks    Status New      PT LONG TERM GOAL #2   Title Pt reports she knows how to control her lymphedema at home with lymph drainage manually or with pump, compression and exercise    Time 8    Period Weeks    Status New                   Plan - 04/12/21 1209      Clinical Impression Statement Continued MLD and compresion bandaging.  Looked at pts old stocking from the new package which was a 10-20 mm hg. Leg continues to have better texture, and pt reports her knee has never felt better.  Will remeasure on Friday.    Personal Factors and Comorbidities Comorbidity 3+    Comorbidities chronic lymphedema, abdominal surgery for cervical cancer removal with 40 lymph nodes removed, right total knee in June 2022.    Examination-Activity Limitations Locomotion Level;Stairs    Stability/Clinical Decision Making Stable/Uncomplicated    Rehab Potential Excellent    PT Frequency 3x / week    PT Duration 8 weeks    PT Treatment/Interventions ADLs/Self Care Home Management;Therapeutic exercise;Patient/family education;Orthotic Fit/Training;Manual techniques;Manual lymph drainage;Compression bandaging;Scar mobilization;Taping;Passive range of motion    PT Next Visit Plan , perform and teach manual lymph drainage and compression bandaging, reinforce exercise, help pt get appropriate day and night garments, consider helping pt explore getting a different sleeve for pump that will provide abdominal decongestion    Consulted and Agree with Plan of Care Patient             Patient will benefit from skilled therapeutic intervention in order to improve the following deficits and impairments:  Increased edema, Decreased knowledge of use of DME, Decreased knowledge of precautions  Visit Diagnosis: Lymphedema, not elsewhere classified     Problem List Patient Active Problem List   Diagnosis Date Noted   Encounter for preoperative examination for general surgical procedure 08/18/2020   Insomnia due to anxiety and fear 08/18/2020   Stress at home- of MOM who is ETOH abuse 06/17/2019   Hyperlipidemia 06/17/2019   HLD (hyperlipidemia)    Obesity 12/05/2018   Vitamin D deficiency 12/05/2018   Postmenopausal 12/05/2018   Family history of diabetes mellitus in father  12/05/2018   Impaired fasting glucose 12/05/2018   Small cell carcinoma ([place])- of the cervix 12/05/2018   Insect bite 09/17/2018   Healthcare maintenance 09/17/2018   Generalized muscle ache 05/15/2018  Hip pain, bilateral 05/15/2018   Chronic pain of both shoulders 05/15/2018   Essential hypertension 01/29/2018   Insomnia 01/29/2018   Attention deficit hyperactivity disorder (ADHD) 01/29/2018   Mood disorder - mixed depression and anxiety 01/29/2018   Excessive drinking of alcohol 01/29/2018   Encounter for long-term (current) use of high-risk medication 01/29/2018   Inactivity 01/29/2018   Health education/counseling 01/29/2018   Chronic anticoagulation 10/06/2016   Paroxysmal atrial fibrillation ([place]) 10/05/2016   Mitral valve disease    PAF (paroxysmal atrial fibrillation) ([place]) 07/01/2013   LAFB (left anterior fascicular block) 07/01/2013   Lymphedema 07/01/2013   Chest pain at rest 10/01/2011   Leg swelling 10/01/2011   h/o Cervical cancer 10/01/2011   H/O total hysterectomy 10/01/2011    Claris Pong, PT 04/12/2021, 12:12 PM  [place] @ [place], [place], [address] Phone: [phone]   Fax:  [phone]  Name: Savannah Hill MRN: [id number] Date of Birth: 12/14/1950

## 2021-04-14 ENCOUNTER — Other Ambulatory Visit: Payer: Self-pay

## 2021-04-14 ENCOUNTER — Ambulatory Visit: Payer: Medicare Other

## 2021-04-14 DIAGNOSIS — I890 Lymphedema, not elsewhere classified: Secondary | ICD-10-CM | POA: Diagnosis not present

## 2021-04-14 NOTE — Therapy (Signed)
[place] @ [place], [place], [address] Phone: [phone]   Fax:  [phone]  Physical Therapy Treatment  Patient Details  Name: Savannah Hill MRN: [id number] Date of Birth: 02/24/1951 Referring Provider (PT): Dr. Megan Salon   Encounter Date: 04/14/2021   PT End of Session - 04/14/21 1217     Visit Number 11    Number of Visits 25    Date for PT Re-Evaluation 05/17/21    PT Start Time 1118    PT Stop Time 1215    PT Time Calculation (min) 57 min    Activity Tolerance Patient tolerated treatment well    Behavior During Therapy [facility] for tasks assessed/performed             Past Medical History:  Diagnosis Date   Anemia    borderline   Atrial fibrillation ([place])    a. s/p ablation on 10/05/2016   Cancer ([place])    cervical/rad hysterectomy/bso wiht chemo for small cell ca   Dyspnea    Heart valve disorder    HLD (hyperlipidemia)    Hypertension    Joint pain    Lower extremity edema    Obesity    Palpitations     Past Surgical History:  Procedure Laterality Date   ABLATION OF DYSRHYTHMIC FOCUS  10/05/2016   ATRIAL FIBRILLATION ABLATION N/A 10/05/2016   Procedure: Atrial Fibrillation Ablation;  Surgeon: Constance Haw, MD;  Location: [facility];  Service: Cardiovascular;  Laterality: N/A;   IR RADIOLOGY PERIPHERAL GUIDED IV START  09/28/2016   IR US GUIDE VASC ACCESS RIGHT  09/28/2016   RADICAL ABDOMINAL HYSTERECTOMY  1986   with BSO   REPLACEMENT TOTAL KNEE Right 2022   TEE WITHOUT CARDIOVERSION N/A 06/07/2016   Procedure: TRANSESOPHAGEAL ECHOCARDIOGRAM (TEE);  Surgeon: Sanda Klein, MD;  Location: [facility] ENDOSCOPY;  Service: Cardiovascular;  Laterality: N/A;    There were no vitals filed for this visit.   Subjective Assessment - 04/14/21 1217     Subjective I think my leg is looking good. I had the flexi touch demo yesterday,    Pertinent History radical  hysterectomy in 1986 for small cell cervical endocrine cancer with 40 pelvic lymph nodes removed with chemo, no radiation.  She developed some swelling after the procedure but has noticed and increase as she has gotten older and with some weight gain.  She has a totat knee  June 2022 and has had an increase in lymphedema since.   She has an extremity  pump from [employer] that she got in June. She says she feel some fullness in her lower abdomen on the left side.  She has also used compression stockings. She had recent removal of basal cell carcinoma on left knee                   LYMPHEDEMA/ONCOLOGY QUESTIONNAIRE - 04/14/21 0001       Right Lower Extremity Lymphedema   20 cm Proximal to Suprapatella 66 cm    10 cm Proximal to Suprapatella 57.1 cm    At Midpatella/Popliteal Crease 44.5 cm    30 cm Proximal to Floor at Lateral Plantar Foot 45.3 cm    20 cm Proximal to Floor at Lateral Plantar Foot 39 1    10 cm Proximal to Floor at Lateral Malleoli 29 cm  [place] Adult PT Treatment/Exercise - 04/14/21 0001       Manual Therapy   Manual Lymphatic Drainage (MLD) continued MLD to short neck, Right axillary and inguinal LN's, 5 breaths, right inguino-axillary pathway, right lateral thigh, then medial to lateral and lateral thigh retracing pathway, then right lower leg front and back retracing steps and ending with LN's. pt performed pathway    Compression Bandaging . TG soft, elastomull to toes 1-4, comprilan soft 10 cm to knee, 15 cm to thigh from knee, 6 cm foot wrap with figure 8's, 8 cm heel locks, 10 cm wrap to knee, and 1 12 cm  mid leg to thigh, 1 wrap knee to thigh, 1 12 cm above knee to thighwraps to thigh, 1st with figure 8, then 12 cm wrap below knee to thigh                       PT Short Term Goals - 02/24/21 1123       PT SHORT TERM GOAL #1   Title Pt/husband will be able to perform self manual lymph drainage or report using  pump with appropriate sleeve    Time 4    Period Weeks    Status New      PT SHORT TERM GOAL #2   Title Pt/ husband will be able to do self compression bandaging    Time 4    Period Weeks    Status New      PT SHORT TERM GOAL #3   Title Pt will report she knows how to use exercise to help control her lymphedema    Time 4    Period Weeks    Status New               PT Long Term Goals - 02/24/21 1125       PT LONG TERM GOAL #1   Title Pt will report she has or knows how to get appropriate day and nighttime garments to help control her lymphedema    Time 8    Period Weeks    Status New      PT LONG TERM GOAL #2   Title Pt reports she knows how to control her lymphedema at home with lymph drainage manually or with pump, compression and exercise    Time 8    Period Weeks    Status New                   Plan - 04/14/21 1218     Clinical Impression Statement pt remeasured with change today only at 10 cm prox to floor, and others remaining fairly stable. continued MLD to the right LE and compression bandaging. Did not put in orange foam at dorsum of foot secondary to some increased redness there where it may have slid. Knee is much more palpable and has improved tissue softness.    Personal Factors and Comorbidities Comorbidity 3+    Comorbidities chronic lymphedema, abdominal surgery for cervical cancer removal with 40 lymph nodes removed, right total knee in June 2022.    Examination-Activity Limitations Locomotion Level;Stairs    Stability/Clinical Decision Making Stable/Uncomplicated    Rehab Potential Excellent    PT Frequency 3x / week    PT Duration 8 weeks    PT Treatment/Interventions ADLs/Self Care Home Management;Therapeutic exercise;Patient/family education;Orthotic Fit/Training;Manual techniques;Manual lymph drainage;Compression bandaging;Scar mobilization;Taping;Passive range of motion    PT Next Visit Plan , perform and teach manual lymph  drainage  and compression bandaging, reinforce exercise, help pt get appropriate day and night garments, consider helping pt explore getting a different sleeve for pump that will provide abdominal decongestion, may be ready for garments next week    Consulted and Agree with Plan of Care Patient             Patient will benefit from skilled therapeutic intervention in order to improve the following deficits and impairments:  Increased edema, Decreased knowledge of use of DME, Decreased knowledge of precautions  Visit Diagnosis: Lymphedema, not elsewhere classified     Problem List Patient Active Problem List   Diagnosis Date Noted   Encounter for preoperative examination for general surgical procedure 08/18/2020   Insomnia due to anxiety and fear 08/18/2020   Stress at home- of MOM who is ETOH abuse 06/17/2019   Hyperlipidemia 06/17/2019   HLD (hyperlipidemia)    Obesity 12/05/2018   Vitamin D deficiency 12/05/2018   Postmenopausal 12/05/2018   Family history of diabetes mellitus in father 12/05/2018   Impaired fasting glucose 12/05/2018   Small cell carcinoma ([place])- of the cervix 12/05/2018   Insect bite 09/17/2018   Healthcare maintenance 09/17/2018   Generalized muscle ache 05/15/2018   Hip pain, bilateral 05/15/2018   Chronic pain of both shoulders 05/15/2018   Essential hypertension 01/29/2018   Insomnia 01/29/2018   Attention deficit hyperactivity disorder (ADHD) 01/29/2018   Mood disorder - mixed depression and anxiety 01/29/2018   Excessive drinking of alcohol 01/29/2018   Encounter for long-term (current) use of high-risk medication 01/29/2018   Inactivity 01/29/2018   Health education/counseling 01/29/2018   Chronic anticoagulation 10/06/2016   Paroxysmal atrial fibrillation ([facility]) 10/05/2016   Mitral valve disease    PAF (paroxysmal atrial fibrillation) ([place]) 07/01/2013   LAFB (left anterior fascicular block) 07/01/2013   Lymphedema 07/01/2013   Chest pain at rest  10/01/2011   Leg swelling 10/01/2011   h/o Cervical cancer 10/01/2011   H/O total hysterectomy 10/01/2011    Claris Pong, PT 04/14/2021, 12:24 PM  [facility] @ [place], [place], [address] Phone: [phone]   Fax:  [phone]  Name: Savannah Hill MRN: [id number] Date of Birth: 10/28/1950

## 2021-04-17 ENCOUNTER — Ambulatory Visit: Payer: Medicare Other

## 2021-04-17 ENCOUNTER — Other Ambulatory Visit: Payer: Self-pay

## 2021-04-17 DIAGNOSIS — I890 Lymphedema, not elsewhere classified: Secondary | ICD-10-CM

## 2021-04-17 NOTE — Therapy (Signed)
[facility] @ [place], [place], [address] Phone: [phone]   Fax:  [phone]  Physical Therapy Treatment  Patient Details  Name: Savannah Hill MRN: [id number] Date of Birth: 02-02-1951 Referring Provider (PT): Dr. Megan Salon   Encounter Date: 04/17/2021   PT End of Session - 04/17/21 1159     Visit Number 12    Number of Visits 25    Date for PT Re-Evaluation 05/17/21    PT Start Time 1100    PT Stop Time 1158    PT Time Calculation (min) 58 min    Activity Tolerance Patient tolerated treatment well    Behavior During Therapy [facility] for tasks assessed/performed             Past Medical History:  Diagnosis Date   Anemia    borderline   Atrial fibrillation ([place])    a. s/p ablation on 10/05/2016   Cancer ([place])    cervical/rad hysterectomy/bso wiht chemo for small cell ca   Dyspnea    Heart valve disorder    HLD (hyperlipidemia)    Hypertension    Joint pain    Lower extremity edema    Obesity    Palpitations     Past Surgical History:  Procedure Laterality Date   ABLATION OF DYSRHYTHMIC FOCUS  10/05/2016   ATRIAL FIBRILLATION ABLATION N/A 10/05/2016   Procedure: Atrial Fibrillation Ablation;  Surgeon: Constance Haw, MD;  Location: [facility];  Service: Cardiovascular;  Laterality: N/A;   IR RADIOLOGY PERIPHERAL GUIDED IV START  09/28/2016   IR US GUIDE VASC ACCESS RIGHT  09/28/2016   RADICAL ABDOMINAL HYSTERECTOMY  1986   with BSO   REPLACEMENT TOTAL KNEE Right 2022   TEE WITHOUT CARDIOVERSION N/A 06/07/2016   Procedure: TRANSESOPHAGEAL ECHOCARDIOGRAM (TEE);  Surgeon: Sanda Klein, MD;  Location: [facility] ENDOSCOPY;  Service: Cardiovascular;  Laterality: N/A;    There were no vitals filed for this visit.   Subjective Assessment - 04/17/21 1102     Subjective I dont' think my leg is changing that much now.  At night when I lay on the couch I feel like a shiver up my  leg and it may happen a few times.    Pertinent History radical hysterectomy in 1986 for small cell cervical endocrine cancer with 40 pelvic lymph nodes removed with chemo, no radiation.  She developed some swelling after the procedure but has noticed and increase as she has gotten older and with some weight gain.  She has a totat knee  June 2022 and has had an increase in lymphedema since.   She has an extremity  pump from [employer] that she got in June. She says she feel some fullness in her lower abdomen on the left side.  She has also used compression stockings. She had recent removal of basal cell carcinoma on left knee    Patient Stated Goals to be able to get her lymphedema under control    Currently in Pain? No/denies                               [facility] Adult PT Treatment/Exercise - 04/17/21 0001       Manual Therapy   Manual therapy comments Showed pictures of night garments, told about [employer], got permission to send info to [employer].    Manual Lymphatic Drainage (MLD) continued MLD to  short neck, Right axillary and inguinal LN's, 5 breaths, right inguino-axillary pathway, right lateral thigh, then medial to lateral and lateral thigh retracing pathway, then right lower leg front and back retracing steps and ending with LN's. pt performed pathway    Compression Bandaging . TG soft, elastomull to toes 1-4, comprilan soft 10 cm to knee, 15 cm to thigh from knee, 6 cm foot wrap with figure 8's, 8 cm heel locks, 10 cm wrap to knee, and 1 12 cm  mid leg to thigh, 1 wrap knee to thigh, 1 12 cm above knee to thighwraps to thigh, 1st with figure 8, then 12 cm wrap below knee to thigh                       PT Short Term Goals - 02/24/21 1123       PT SHORT TERM GOAL #1   Title Pt/husband will be able to perform self manual lymph drainage or report using pump with appropriate sleeve    Time 4    Period Weeks    Status New      PT SHORT TERM GOAL #2   Title  Pt/ husband will be able to do self compression bandaging    Time 4    Period Weeks    Status Schieved     PT SHORT TERM GOAL #3   Title Pt will report she knows how to use exercise to help control her lymphedema    Time 4    Period Weeks    Status New               PT Long Term Goals - 02/24/21 1125       PT LONG TERM GOAL #1   Title Pt will report she has or knows how to get appropriate day and nighttime garments to help control her lymphedema    Time 8    Period Weeks    Status New      PT LONG TERM GOAL #2   Title Pt reports she knows how to control her lymphedema at home with lymph drainage manually or with pump, compression and exercise    Time 8    Period Weeks    Status New                   Plan - 04/17/21 1200     Clinical Impression Statement Continued manual lymphatic drainage and compression bandaging by PT.  Pt notes she has not lost any wt. this week.  Entire lower leg with improvements in tissue texture but no visual changes noted today.  Showed pt some pictures for options of night garment and discussed [employer] cap of $500.00. Pt gave approval to send info to [employer].    Personal Factors and Comorbidities Comorbidity 3+    Comorbidities chronic lymphedema, abdominal surgery for cervical cancer removal with 40 lymph nodes removed, right total knee in June 2022.    Examination-Activity Limitations Locomotion Level;Stairs    Stability/Clinical Decision Making Stable/Uncomplicated    Rehab Potential Excellent    PT Frequency 3x / week    PT Duration 8 weeks    PT Treatment/Interventions ADLs/Self Care Home Management;Therapeutic exercise;Patient/family education;Orthotic Fit/Training;Manual techniques;Manual lymph drainage;Compression bandaging;Scar mobilization;Taping;Passive range of motion    PT Next Visit Plan , perform and teach manual lymph drainage and compression bandaging, reinforce exercise, help pt get appropriate day and night  garments, consider helping pt explore getting a different  sleeve for pump that will provide abdominal decongestion, may be ready for garments next week    Recommended Other Services had flexi demo;awaiting cost    Consulted and Agree with Plan of Care Patient             Patient will benefit from skilled therapeutic intervention in order to improve the following deficits and impairments:  Increased edema, Decreased knowledge of use of DME, Decreased knowledge of precautions  Visit Diagnosis: Lymphedema, not elsewhere classified     Problem List Patient Active Problem List   Diagnosis Date Noted   Encounter for preoperative examination for general surgical procedure 08/18/2020   Insomnia due to anxiety and fear 08/18/2020   Stress at home- of MOM who is ETOH abuse 06/17/2019   Hyperlipidemia 06/17/2019   HLD (hyperlipidemia)    Obesity 12/05/2018   Vitamin D deficiency 12/05/2018   Postmenopausal 12/05/2018   Family history of diabetes mellitus in father 12/05/2018   Impaired fasting glucose 12/05/2018   Small cell carcinoma ([place])- of the cervix 12/05/2018   Insect bite 09/17/2018   Healthcare maintenance 09/17/2018   Generalized muscle ache 05/15/2018   Hip pain, bilateral 05/15/2018   Chronic pain of both shoulders 05/15/2018   Essential hypertension 01/29/2018   Insomnia 01/29/2018   Attention deficit hyperactivity disorder (ADHD) 01/29/2018   Mood disorder - mixed depression and anxiety 01/29/2018   Excessive drinking of alcohol 01/29/2018   Encounter for long-term (current) use of high-risk medication 01/29/2018   Inactivity 01/29/2018   Health education/counseling 01/29/2018   Chronic anticoagulation 10/06/2016   Paroxysmal atrial fibrillation ([facility]) 10/05/2016   Mitral valve disease    PAF (paroxysmal atrial fibrillation) ([place]) 07/01/2013   LAFB (left anterior fascicular block) 07/01/2013   Lymphedema 07/01/2013   Chest pain at rest 10/01/2011   Leg swelling  10/01/2011   h/o Cervical cancer 10/01/2011   H/O total hysterectomy 10/01/2011    Claris Pong, PT 04/17/2021, 12:03 PM  [facility] @ [place], [place], [address] Phone: [phone]   Fax:  [phone]  Name: TANYIKA BARROS MRN: [id number] Date of Birth: [birth year]

## 2021-04-19 ENCOUNTER — Other Ambulatory Visit: Payer: Self-pay

## 2021-04-19 ENCOUNTER — Ambulatory Visit: Payer: Medicare Other | Attending: Obstetrics & Gynecology

## 2021-04-19 DIAGNOSIS — I890 Lymphedema, not elsewhere classified: Secondary | ICD-10-CM | POA: Insufficient documentation

## 2021-04-19 NOTE — Therapy (Signed)
[facility] @ [address] [place], [place], [address] Phone: [phone]   Fax:  [phone]  Physical Therapy Treatment  Patient Details  Name: Savannah Hill MRN: [id number] Date of Birth: Jul 05, 1950 Referring Provider (PT): Dr. Megan Salon   Encounter Date: 04/19/2021   PT End of Session - 04/19/21 1200     Visit Number 13    Number of Visits 25    Date for PT Re-Evaluation 05/17/21    PT Start Time 1103    PT Stop Time 1154    PT Time Calculation (min) 51 min    Activity Tolerance Patient tolerated treatment well    Behavior During Therapy [facility] for tasks assessed/performed             Past Medical History:  Diagnosis Date   Anemia    borderline   Atrial fibrillation ([place])    a. s/p ablation on 10/05/2016   Cancer ([place])    cervical/rad hysterectomy/bso wiht chemo for small cell ca   Dyspnea    Heart valve disorder    HLD (hyperlipidemia)    Hypertension    Joint pain    Lower extremity edema    Obesity    Palpitations     Past Surgical History:  Procedure Laterality Date   ABLATION OF DYSRHYTHMIC FOCUS  10/05/2016   ATRIAL FIBRILLATION ABLATION N/A 10/05/2016   Procedure: Atrial Fibrillation Ablation;  Surgeon: Constance Haw, MD;  Location: [facility];  Service: Cardiovascular;  Laterality: N/A;   IR RADIOLOGY PERIPHERAL GUIDED IV START  09/28/2016   IR US GUIDE VASC ACCESS RIGHT  09/28/2016   RADICAL ABDOMINAL HYSTERECTOMY  1986   with BSO   REPLACEMENT TOTAL KNEE Right 2022   TEE WITHOUT CARDIOVERSION N/A 06/07/2016   Procedure: TRANSESOPHAGEAL ECHOCARDIOGRAM (TEE);  Surgeon: Sanda Klein, MD;  Location: [facility] ENDOSCOPY;  Service: Cardiovascular;  Laterality: N/A;    There were no vitals filed for this visit.                      [facility] Adult PT Treatment/Exercise - 04/19/21 0001       Manual Therapy   Manual therapy comments gave pt info for [employer] in  [place] to call and check on stockings, gave info on compression guru site for tribute night and [employer] to look at.    Manual Lymphatic Drainage (MLD) continued MLD to short neck, Right axillary and inguinal LN's, 5 breaths, right inguino-axillary pathway, right lateral thigh, then medial to lateral and lateral thigh retracing pathway, then right lower leg front and back retracing steps and ending with LN's. pt performed pathway . Pt performed pathway   Compression Bandaging . TG soft, elastomull to toes 1-4, comprilan soft 10 cm to knee, 15 cm to thigh from knee, 6 cm foot wrap with figure 8's, 8 cm heel locks, 10 cm wrap to knee, and 1 12 cm  mid leg to thigh, 1 wrap knee to thigh, 1 12 cm above knee to mid thigh thighwraps to thigh , then 1 12 cm wrap below knee to thigh, and 1 12 cm above knee to upper thigh                       PT Short Term Goals - 02/24/21 1123       PT SHORT TERM GOAL #1   Title Pt/husband will be able to perform self  manual lymph drainage or report using pump with appropriate sleeve    Time 4    Period Weeks    Status New      PT SHORT TERM GOAL #2   Title Pt/ husband will be able to do self compression bandaging    Time 4    Period Weeks    Status New      PT SHORT TERM GOAL #3   Title Pt will report she knows how to use exercise to help control her lymphedema    Time 4    Period Weeks    Status New               PT Long Term Goals - 02/24/21 1125       PT LONG TERM GOAL #1   Title Pt will report she has or knows how to get appropriate day and nighttime garments to help control her lymphedema    Time 8    Period Weeks    Status New      PT LONG TERM GOAL #2   Title Pt reports she knows how to control her lymphedema at home with lymph drainage manually or with pump, compression and exercise    Time 8    Period Weeks    Status New                   Plan - 04/19/21 1201     Clinical Impression Statement  Continued MLD and compression bandaging and will remeasure on Friday. Pts leg looks and feels very good and pt is likely near ready to have her new garments. Entire lower leg and knee with great improvement in tissue texture.  Thigh wrap tends to slide and hasn't reduced as much.    Personal Factors and Comorbidities Comorbidity 3+    Comorbidities chronic lymphedema, abdominal surgery for cervical cancer removal with 40 lymph nodes removed, right total knee in June 2022.    Examination-Activity Limitations Locomotion Level;Stairs    Stability/Clinical Decision Making Stable/Uncomplicated    Rehab Potential Excellent    PT Frequency 3x / week    PT Duration 8 weeks    PT Treatment/Interventions ADLs/Self Care Home Management;Therapeutic exercise;Patient/family education;Orthotic Fit/Training;Manual techniques;Manual lymph drainage;Compression bandaging;Scar mobilization;Taping;Passive range of motion    PT Next Visit Plan continue MLD, compression bandaging, pts insurance covers Flexi 100%, remeasure Fri and consider measuring next week for garments    Consulted and Agree with Plan of Care Patient             Patient will benefit from skilled therapeutic intervention in order to improve the following deficits and impairments:  Increased edema, Decreased knowledge of use of DME, Decreased knowledge of precautions  Visit Diagnosis: Lymphedema, not elsewhere classified     Problem List Patient Active Problem List   Diagnosis Date Noted   Encounter for preoperative examination for general surgical procedure 08/18/2020   Insomnia due to anxiety and fear 08/18/2020   Stress at home- of MOM who is ETOH abuse 06/17/2019   Hyperlipidemia 06/17/2019   HLD (hyperlipidemia)    Obesity 12/05/2018   Vitamin D deficiency 12/05/2018   Postmenopausal 12/05/2018   Family history of diabetes mellitus in father 12/05/2018   Impaired fasting glucose 12/05/2018   Small cell carcinoma ([place])- of the  cervix 12/05/2018   Insect bite 09/17/2018   Healthcare maintenance 09/17/2018   Generalized muscle ache 05/15/2018   Hip pain, bilateral 05/15/2018   Chronic pain of both  shoulders 05/15/2018   Essential hypertension 01/29/2018   Insomnia 01/29/2018   Attention deficit hyperactivity disorder (ADHD) 01/29/2018   Mood disorder - mixed depression and anxiety 01/29/2018   Excessive drinking of alcohol 01/29/2018   Encounter for long-term (current) use of high-risk medication 01/29/2018   Inactivity 01/29/2018   Health education/counseling 01/29/2018   Chronic anticoagulation 10/06/2016   Paroxysmal atrial fibrillation ([place]) 10/05/2016   Mitral valve disease    PAF (paroxysmal atrial fibrillation) ([place]) 07/01/2013   LAFB (left anterior fascicular block) 07/01/2013   Lymphedema 07/01/2013   Chest pain at rest 10/01/2011   Leg swelling 10/01/2011   h/o Cervical cancer 10/01/2011   H/O total hysterectomy 10/01/2011    Claris Pong, PT 04/19/2021, 12:04 PM  [facility] @ [place], [place], [address] Phone: [phone]   Fax:  [phone]  Name: Savannah Hill MRN: [id number] Date of Birth: [birth year]

## 2021-04-21 ENCOUNTER — Ambulatory Visit: Payer: Medicare Other

## 2021-04-21 ENCOUNTER — Other Ambulatory Visit: Payer: Self-pay

## 2021-04-21 DIAGNOSIS — I890 Lymphedema, not elsewhere classified: Secondary | ICD-10-CM

## 2021-04-21 NOTE — Therapy (Signed)
[facility] @ [address] [place], [place], [address] Phone: [phone]   Fax:  [phone]  Physical Therapy Treatment  Patient Details  Name: Savannah Hill MRN: [id number] Date of Birth: [birth year] Referring Provider (PT): Dr. Megan Salon   Encounter Date: 04/21/2021   PT End of Session - 04/21/21 1220     Visit Number 14    Date for PT Re-Evaluation 05/17/21    PT Start Time 1104    PT Stop Time 1203    PT Time Calculation (min) 59 min    Activity Tolerance Patient tolerated treatment well    Behavior During Therapy [facility] for tasks assessed/performed             Past Medical History:  Diagnosis Date   Anemia    borderline   Atrial fibrillation ([place])    a. s/p ablation on 10/05/2016   Cancer ([place])    cervical/rad hysterectomy/bso wiht chemo for small cell ca   Dyspnea    Heart valve disorder    HLD (hyperlipidemia)    Hypertension    Joint pain    Lower extremity edema    Obesity    Palpitations     Past Surgical History:  Procedure Laterality Date   ABLATION OF DYSRHYTHMIC FOCUS  10/05/2016   ATRIAL FIBRILLATION ABLATION N/A 10/05/2016   Procedure: Atrial Fibrillation Ablation;  Surgeon: Constance Haw, MD;  Location: [facility];  Service: Cardiovascular;  Laterality: N/A;   IR RADIOLOGY PERIPHERAL GUIDED IV START  09/28/2016   IR US GUIDE VASC ACCESS RIGHT  09/28/2016   RADICAL ABDOMINAL HYSTERECTOMY  1986   with BSO   REPLACEMENT TOTAL KNEE Right 2022   TEE WITHOUT CARDIOVERSION N/A 06/07/2016   Procedure: TRANSESOPHAGEAL ECHOCARDIOGRAM (TEE);  Surgeon: Sanda Klein, MD;  Location: [facility] ENDOSCOPY;  Service: Cardiovascular;  Laterality: N/A;    There were no vitals filed for this visit.   Subjective Assessment - 04/21/21 1103     Subjective Very pleased with how leg looks and feels.  It doesn't feel like it is changing much now    Patient is accompained by: Family member     Pertinent History radical hysterectomy in 1986 for small cell cervical endocrine cancer with 40 pelvic lymph nodes removed with chemo, no radiation.  She developed some swelling after the procedure but has noticed and increase as she has gotten older and with some weight gain.  She has a totat knee  June 2022 and has had an increase in lymphedema since.   She has an extremity  pump from [employer] that she got in June. She says she feel some fullness in her lower abdomen on the left side.  She has also used compression stockings. She had recent removal of basal cell carcinoma on left knee    Patient Stated Goals to be able to get her lymphedema under control    Currently in Pain? No/denies    Multiple Pain Sites No                   LYMPHEDEMA/ONCOLOGY QUESTIONNAIRE - 04/21/21 0001       Right Lower Extremity Lymphedema   20 cm Proximal to Suprapatella 65 cm    10 cm Proximal to Suprapatella 57.5 cm    At Midpatella/Popliteal Crease 43.5 cm    30 cm Proximal to Floor at Lateral Plantar Foot 47 cm    20 cm Proximal to Floor at  Lateral Plantar Foot 40 1    10 cm Proximal to Floor at Lateral Malleoli 30.1 cm    5 cm Proximal to 1st MTP Joint 22.4 cm    Around Proximal Great Toe 7.9 cm                        [facility] Adult PT Treatment/Exercise - 04/21/21 0001       Manual Therapy   Manual therapy comments pt remeasured, pt shown EZ slide and leg cage for donning    Manual Lymphatic Drainage (MLD) continued MLD to short neck, Right axillary and inguinal LN's, 5 breaths, right inguino-axillary pathway, right lateral thigh, then medial to lateral and lateral thigh retracing pathway, then right lower leg front and back retracing steps and ending with LN's.    Compression Bandaging . TG soft, elastomull to toes 1-4, comprilan soft 10 cm to knee, 15 cm to thigh from knee, 6 cm foot wrap with figure 8's, 8 cm heel locks, 10 cm wrap to knee, and 1 12 cm  mid leg to mid thigh, 1 12  cm wrap knee to thigh, 1 12 cm above knee to mid thigh , then 1 12 cm wrap below knee to thigh.                      PT Short Term Goals - 02/24/21 1123       PT SHORT TERM GOAL #1   Title Pt/husband will be able to perform self manual lymph drainage or report using pump with appropriate sleeve    Time 4    Period Weeks    Status New      PT SHORT TERM GOAL #2   Title Pt/ husband will be able to do self compression bandaging    Time 4    Period Weeks    Status New      PT SHORT TERM GOAL #3   Title Pt will report she knows how to use exercise to help control her lymphedema    Time 4    Period Weeks    Status New               PT Long Term Goals - 02/24/21 1125       PT LONG TERM GOAL #1   Title Pt will report she has or knows how to get appropriate day and nighttime garments to help control her lymphedema    Time 8    Period Weeks    Status New      PT LONG TERM GOAL #2   Title Pt reports she knows how to control her lymphedema at home with lymph drainage manually or with pump, compression and exercise    Time 8    Period Weeks    Status New                   Plan - 04/21/21 1221     Clinical Impression Statement Pt remeasured with some increases noted in right lower leg and some slight decreases but remaining fairly stable.  Pt is now ready to be fit for garments day and night. She would like to try some class 2 stockings from [place] and was given the info. She is considering going on Monday prior to her appt here.  She and her husband were shown several different donning aids and took the slipeze with them to try if they go on Monday.  We are awaiting measuring by [employer] this week for night garments and possible flat knit stocking depending on cost. Pt has had 4 full weeks of conservative therpies including compression, MLD, elevation and exercise for her chronic LE lymphedema due sto small cell cervical cancer and the removal of 40 pelvic  LN's.  Despite consistent use of compression, the basic pump, and MLD her symptoms persist. Leg texture is much improved overall, however she does continue with hyperkeratosis and hyperplasia lymphostatic lymphedema.. She has used the [employer] [id number] basic compression pump daily for many months at 50 mmhg and yet swelling remains and interferes with her normal daily activities. The Flexitouch pump is medically necessary for her to avoid congestion in the pelvic/abdominal area and to  maintain/reduce her LE swelling which will reduce her risk for infection and will enable her to have a more functional lifestyle.    Personal Factors and Comorbidities Comorbidity 3+    Comorbidities chronic lymphedema, abdominal surgery for cervical cancer removal with 40 lymph nodes removed, right total knee in June 2022.    Examination-Activity Limitations Locomotion Level;Stairs    Stability/Clinical Decision Making Stable/Uncomplicated    Rehab Potential Excellent    PT Frequency 3x / week    PT Treatment/Interventions ADLs/Self Care Home Management;Therapeutic exercise;Patient/family education;Orthotic Fit/Training;Manual techniques;Manual lymph drainage;Compression bandaging;Scar mobilization;Taping;Passive range of motion    PT Next Visit Plan continue MLD, compression bandaging, pts insurance covers Flexi 100%, remeasure Fri and consider measuring next week for garments, exercises    PT Home Exercise Plan husband bandaging, trunk MLD when using basic pump    Consulted and Agree with Plan of Care Patient    Family Member Consulted husband             Patient will benefit from skilled therapeutic intervention in order to improve the following deficits and impairments:  Increased edema, Decreased knowledge of use of DME, Decreased knowledge of precautions  Visit Diagnosis: Lymphedema, not elsewhere classified     Problem List Patient Active Problem List   Diagnosis Date Noted   Encounter for preoperative  examination for general surgical procedure 08/18/2020   Insomnia due to anxiety and fear 08/18/2020   Stress at home- of MOM who is ETOH abuse 06/17/2019   Hyperlipidemia 06/17/2019   HLD (hyperlipidemia)    Obesity 12/05/2018   Vitamin D deficiency 12/05/2018   Postmenopausal 12/05/2018   Family history of diabetes mellitus in father 12/05/2018   Impaired fasting glucose 12/05/2018   Small cell carcinoma ([place])- of the cervix 12/05/2018   Insect bite 09/17/2018   Healthcare maintenance 09/17/2018   Generalized muscle ache 05/15/2018   Hip pain, bilateral 05/15/2018   Chronic pain of both shoulders 05/15/2018   Essential hypertension 01/29/2018   Insomnia 01/29/2018   Attention deficit hyperactivity disorder (ADHD) 01/29/2018   Mood disorder - mixed depression and anxiety 01/29/2018   Excessive drinking of alcohol 01/29/2018   Encounter for long-term (current) use of high-risk medication 01/29/2018   Inactivity 01/29/2018   Health education/counseling 01/29/2018   Chronic anticoagulation 10/06/2016   Paroxysmal atrial fibrillation ([facility]) 10/05/2016   Mitral valve disease    PAF (paroxysmal atrial fibrillation) ([place]) 07/01/2013   LAFB (left anterior fascicular block) 07/01/2013   Lymphedema 07/01/2013   Chest pain at rest 10/01/2011   Leg swelling 10/01/2011   h/o Cervical cancer 10/01/2011   H/O total hysterectomy 10/01/2011    Claris Pong, PT 04/21/2021, 12:48 PM  [facility] @ [place], [place], [address] Phone: [phone]   Fax:  [phone]  Name: Savannah Hill MRN: [id number] Date of Birth: 1950-05-08

## 2021-04-24 ENCOUNTER — Ambulatory Visit: Payer: Medicare Other | Admitting: Physical Therapy

## 2021-04-24 ENCOUNTER — Encounter: Payer: Self-pay | Admitting: Physical Therapy

## 2021-04-24 ENCOUNTER — Other Ambulatory Visit: Payer: Self-pay

## 2021-04-24 DIAGNOSIS — I890 Lymphedema, not elsewhere classified: Secondary | ICD-10-CM

## 2021-04-24 NOTE — Therapy (Signed)
[facility] @ [place], [place], [address] Phone: [phone]   Fax:  [phone]  Physical Therapy Treatment  Patient Details  Name: Savannah Hill MRN: [id number] Date of Birth: [birth year] Referring Provider (PT): Dr. Megan Salon   Encounter Date: 04/24/2021   PT End of Session - 04/24/21 1223     Visit Number 15    Number of Visits 25    Date for PT Re-Evaluation 05/17/21    PT Start Time 1100    PT Stop Time 1202    PT Time Calculation (min) 62 min    Activity Tolerance Patient tolerated treatment well    Behavior During Therapy [facility] for tasks assessed/performed             Past Medical History:  Diagnosis Date   Anemia    borderline   Atrial fibrillation ([place])    a. s/p ablation on 10/05/2016   Cancer ([place])    cervical/rad hysterectomy/bso wiht chemo for small cell ca   Dyspnea    Heart valve disorder    HLD (hyperlipidemia)    Hypertension    Joint pain    Lower extremity edema    Obesity    Palpitations     Past Surgical History:  Procedure Laterality Date   ABLATION OF DYSRHYTHMIC FOCUS  10/05/2016   ATRIAL FIBRILLATION ABLATION N/A 10/05/2016   Procedure: Atrial Fibrillation Ablation;  Surgeon: Constance Haw, MD;  Location: [facility];  Service: Cardiovascular;  Laterality: N/A;   IR RADIOLOGY PERIPHERAL GUIDED IV START  09/28/2016   IR US GUIDE VASC ACCESS RIGHT  09/28/2016   RADICAL ABDOMINAL HYSTERECTOMY  1986   with BSO   REPLACEMENT TOTAL KNEE Right 2022   TEE WITHOUT CARDIOVERSION N/A 06/07/2016   Procedure: TRANSESOPHAGEAL ECHOCARDIOGRAM (TEE);  Surgeon: Sanda Klein, MD;  Location: [facility] ENDOSCOPY;  Service: Cardiovascular;  Laterality: N/A;    There were no vitals filed for this visit.   Subjective Assessment - 04/24/21 1220     Subjective Pt went to [facility] for [facility] Therapy this morning, but they are closed on Monday and Tuesday. Her husband is  bandaging at home and he fells that is going ok. She is waiting to hear from [facility]. She has a pain in her left great toe last night that she thinks might have come from her sandal.  There is no warmness or redness or open area at the toe    Patient is accompained by: Family member    Pertinent History radical hysterectomy in 1986 for small cell cervical endocrine cancer with 40 pelvic lymph nodes removed with chemo, no radiation.  She developed some swelling after the procedure but has noticed and increase as she has gotten older and with some weight gain.  She has a totat knee  June 2022 and has had an increase in lymphedema since.   She has an extremity  pump from [employer] that she got in June. She says she feel some fullness in her lower abdomen on the left side.  She has also used compression stockings. She had recent removal of basal cell carcinoma on left knee    Currently in Pain? No/denies                               [facility] Adult PT Treatment/Exercise - 04/24/21 0001       Manual Therapy  Manual Lymphatic Drainage (MLD) continued MLD to short neck, Right axillary and inguinal LN's, 5 breaths, right inguino-axillary pathway, right lateral thigh, then medial to lateral and lateral thigh retracing pathway, then right lower leg front and back retracing steps and ending with LN's.   left leg elevated and encouraged pt to keep foot/leg elevated and do foot and toe exercises at home   Compression Bandaging . TG soft, elastomull to toes 1-4, comprilan soft 10 cm to knee, 15 cm to thigh from knee, 6 cm foot wrap with figure 8's, 8 cm heel locks, 10 cm wrap to knee, and 1 12 cm  mid leg to thigh, 1 wrap knee to thigh, 1 12 cm above knee to mid thigh thighwraps to thigh , then 1 12 cm wrap below knee to thigh, and 1 12 cm above knee to upper thigh                       PT Short Term Goals - 04/24/21 1225       PT SHORT TERM GOAL #1   Title Pt/husband will be  able to perform self manual lymph drainage or report using pump with appropriate sleeve    Period Weeks    Status On-going      PT SHORT TERM GOAL #2   Title Pt/ husband will be able to do self compression bandaging    Status Achieved      PT SHORT TERM GOAL #3   Title Pt will report she knows how to use exercise to help control her lymphedema    Time 4    Period Weeks    Status On-going               PT Long Term Goals - 04/24/21 1225       PT LONG TERM GOAL #1   Title Pt will report she has or knows how to get appropriate day and nighttime garments to help control her lymphedema    Period Weeks    Status On-going      PT LONG TERM GOAL #2   Period Weeks    Status On-going                   Plan - 04/24/21 1223     Clinical Impression Statement Pt and husband report they are doing well at home. Pt had some fullness at forefoot just behind toes that were not covered well by bandages husband had applied, but this was reduced by end of session. Pt is anxious to get her stockings and Flexitouch so they can manage at home    Comorbidities chronic lymphedema, abdominal surgery for cervical cancer removal with 40 lymph nodes removed, right total knee in June 2022.    Examination-Activity Limitations Locomotion Level;Stairs    Stability/Clinical Decision Making Stable/Uncomplicated    Rehab Potential Excellent    PT Frequency 3x / week    PT Duration 8 weeks    PT Treatment/Interventions ADLs/Self Care Home Management;Therapeutic exercise;Patient/family education;Orthotic Fit/Training;Manual techniques;Manual lymph drainage;Compression bandaging;Scar mobilization;Taping;Passive range of motion    PT Next Visit Plan continue MLD, compression bandaging, pts insurance covers Flexi 100%, remeasure Fri and consider measuring next week for garments, exercises    PT Home Exercise Plan husband bandaging, trunk MLD when using basic pump    Consulted and Agree with Plan of Care  Patient    Family Member Consulted husband  Patient will benefit from skilled therapeutic intervention in order to improve the following deficits and impairments:  Increased edema, Decreased knowledge of use of DME, Decreased knowledge of precautions  Visit Diagnosis: Lymphedema, not elsewhere classified     Problem List Patient Active Problem List   Diagnosis Date Noted   Encounter for preoperative examination for general surgical procedure 08/18/2020   Insomnia due to anxiety and fear 08/18/2020   Stress at home- of MOM who is ETOH abuse 06/17/2019   Hyperlipidemia 06/17/2019   HLD (hyperlipidemia)    Obesity 12/05/2018   Vitamin D deficiency 12/05/2018   Postmenopausal 12/05/2018   Family history of diabetes mellitus in father 12/05/2018   Impaired fasting glucose 12/05/2018   Small cell carcinoma ([place])- of the cervix 12/05/2018   Insect bite 09/17/2018   Healthcare maintenance 09/17/2018   Generalized muscle ache 05/15/2018   Hip pain, bilateral 05/15/2018   Chronic pain of both shoulders 05/15/2018   Essential hypertension 01/29/2018   Insomnia 01/29/2018   Attention deficit hyperactivity disorder (ADHD) 01/29/2018   Mood disorder - mixed depression and anxiety 01/29/2018   Excessive drinking of alcohol 01/29/2018   Encounter for long-term (current) use of high-risk medication 01/29/2018   Inactivity 01/29/2018   Health education/counseling 01/29/2018   Chronic anticoagulation 10/06/2016   Paroxysmal atrial fibrillation ([place]) 10/05/2016   Mitral valve disease    PAF (paroxysmal atrial fibrillation) ([place]) 07/01/2013   LAFB (left anterior fascicular block) 07/01/2013   Lymphedema 07/01/2013   Chest pain at rest 10/01/2011   Leg swelling 10/01/2011   h/o Cervical cancer 10/01/2011   H/O total hysterectomy 10/01/2011   Donato Heinz. Owens Shark PT  Norwood Levo, PT 04/24/2021, 12:26 PM  [facility] @  [place], [place], [address] Phone: [phone]   Fax:  [phone]  Name: Savannah Hill MRN: [id number] Date of Birth: 1950/11/20

## 2021-04-26 ENCOUNTER — Ambulatory Visit: Payer: Medicare Other

## 2021-04-26 ENCOUNTER — Other Ambulatory Visit: Payer: Self-pay

## 2021-04-26 DIAGNOSIS — I890 Lymphedema, not elsewhere classified: Secondary | ICD-10-CM

## 2021-04-26 NOTE — Therapy (Signed)
[facility] @ [place], [place], [address] Phone: [phone]   Fax:  [phone]  Physical Therapy Treatment  Patient Details  Name: Savannah Hill MRN: [id number] Date of Birth: June 10, 1950 Referring Provider (PT): Dr. Megan Salon   Encounter Date: 04/26/2021   PT End of Session - 04/26/21 1719     Visit Number 41   KX modifier   Number of Visits 25    Date for PT Re-Evaluation 05/17/21    PT Start Time 1504    PT Stop Time 1602    PT Time Calculation (min) 58 min    Activity Tolerance Patient tolerated treatment well    Behavior During Therapy [facility] for tasks assessed/performed             Past Medical History:  Diagnosis Date   Anemia    borderline   Atrial fibrillation ([place])    a. s/p ablation on 10/05/2016   Cancer ([place])    cervical/rad hysterectomy/bso wiht chemo for small cell ca   Dyspnea    Heart valve disorder    HLD (hyperlipidemia)    Hypertension    Joint pain    Lower extremity edema    Obesity    Palpitations     Past Surgical History:  Procedure Laterality Date   ABLATION OF DYSRHYTHMIC FOCUS  10/05/2016   ATRIAL FIBRILLATION ABLATION N/A 10/05/2016   Procedure: Atrial Fibrillation Ablation;  Surgeon: Constance Haw, MD;  Location: [facility];  Service: Cardiovascular;  Laterality: N/A;   IR RADIOLOGY PERIPHERAL GUIDED IV START  09/28/2016   IR US GUIDE VASC ACCESS RIGHT  09/28/2016   RADICAL ABDOMINAL HYSTERECTOMY  1986   with BSO   REPLACEMENT TOTAL KNEE Right 2022   TEE WITHOUT CARDIOVERSION N/A 06/07/2016   Procedure: TRANSESOPHAGEAL ECHOCARDIOGRAM (TEE);  Surgeon: Sanda Klein, MD;  Location: [facility] ENDOSCOPY;  Service: Cardiovascular;  Laterality: N/A;    There were no vitals filed for this visit.   Subjective Assessment - 04/26/21 1716     Subjective I had to take a shower today so we unwrapped leg and Rick Rewrapped.  I have decided I want the  [employer] Medaform night garment and to get a stocking at [employer]. I am not interested in the flat knit stocking. My big toe was really sore on Monday for some reason so we padded it alittle. It seems fine now    Pertinent History radical hysterectomy in 1986 for small cell cervical endocrine cancer with 40 pelvic lymph nodes removed with chemo, no radiation.  She developed some swelling after the procedure but has noticed and increase as she has gotten older and with some weight gain.  She has a totat knee  June 2022 and has had an increase in lymphedema since.   She has an extremity  pump from [employer] that she got in June. She says she feel some fullness in her lower abdomen on the left side.  She has also used compression stockings. She had recent removal of basal cell carcinoma on left knee    Patient Stated Goals to be able to get her lymphedema under control    Currently in Pain? No/denies    Multiple Pain Sites No                               [facility] Adult PT Treatment/Exercise - 04/26/21 0001  Manual Therapy   Manual therapy comments Pt unwrapped    Manual Lymphatic Drainage (MLD) continued MLD to short neck, Right axillary and inguinal LN's, 5 breaths, right inguino-axillary pathway, right lateral thigh, then medial to lateral and lateral thigh retracing pathway, then right lower leg front and back retracing steps and ending with LN's.    Compression Bandaging . TG soft, elastomull to toes 1-4, small piece of comprilan soft over big toe before toe wraps, comprilan soft 10 cm to knee, 15 cm comprilan soft to thigh from knee, 6 cm foot wrap with figure 8's, 8 cm heel locks, 12 cm wrap to knee, and 1 12 cm  mid leg to thigh, 1 12 cm wrap knee to thigh, 1 12 cm above knee to thigh. TG soft pulled down over the top.                       PT Short Term Goals - 04/24/21 1225       PT SHORT TERM GOAL #1   Title Pt/husband will be able to perform self manual  lymph drainage or report using pump with appropriate sleeve    Period Weeks    Status On-going      PT SHORT TERM GOAL #2   Title Pt/ husband will be able to do self compression bandaging    Status Achieved      PT SHORT TERM GOAL #3   Title Pt will report she knows how to use exercise to help control her lymphedema    Time 4    Period Weeks    Status On-going               PT Long Term Goals - 04/24/21 1225       PT LONG TERM GOAL #1   Title Pt will report she has or knows how to get appropriate day and nighttime garments to help control her lymphedema    Period Weeks    Status On-going      PT LONG TERM GOAL #2   Period Weeks    Status On-going                    Patient will benefit from skilled therapeutic intervention in order to improve the following deficits and impairments:     Visit Diagnosis: Lymphedema, not elsewhere classified     Problem List Patient Active Problem List   Diagnosis Date Noted   Encounter for preoperative examination for general surgical procedure 08/18/2020   Insomnia due to anxiety and fear 08/18/2020   Stress at home- of MOM who is ETOH abuse 06/17/2019   Hyperlipidemia 06/17/2019   HLD (hyperlipidemia)    Obesity 12/05/2018   Vitamin D deficiency 12/05/2018   Postmenopausal 12/05/2018   Family history of diabetes mellitus in father 12/05/2018   Impaired fasting glucose 12/05/2018   Small cell carcinoma ([place])- of the cervix 12/05/2018   Insect bite 09/17/2018   Healthcare maintenance 09/17/2018   Generalized muscle ache 05/15/2018   Hip pain, bilateral 05/15/2018   Chronic pain of both shoulders 05/15/2018   Essential hypertension 01/29/2018   Insomnia 01/29/2018   Attention deficit hyperactivity disorder (ADHD) 01/29/2018   Mood disorder - mixed depression and anxiety 01/29/2018   Excessive drinking of alcohol 01/29/2018   Encounter for long-term (current) use of high-risk medication 01/29/2018    Inactivity 01/29/2018   Health education/counseling 01/29/2018   Chronic anticoagulation 10/06/2016   Paroxysmal atrial fibrillation (  [place]) 10/05/2016   Mitral valve disease    PAF (paroxysmal atrial fibrillation) ([place]) 07/01/2013   LAFB (left anterior fascicular block) 07/01/2013   Lymphedema 07/01/2013   Chest pain at rest 10/01/2011   Leg swelling 10/01/2011   h/o Cervical cancer 10/01/2011   H/O total hysterectomy 10/01/2011    Claris Pong, PT 04/26/2021, 5:27 PM  [facility] @ [place], [place], [address] Phone: [phone]   Fax:  [phone]  Name: Savannah Hill MRN: [id number] Date of Birth: June 06, 1950

## 2021-04-28 ENCOUNTER — Other Ambulatory Visit: Payer: Self-pay

## 2021-04-28 ENCOUNTER — Ambulatory Visit: Payer: Medicare Other

## 2021-04-28 DIAGNOSIS — I890 Lymphedema, not elsewhere classified: Secondary | ICD-10-CM | POA: Diagnosis not present

## 2021-04-28 NOTE — Therapy (Signed)
[facility] @ [place], [place], [address] Phone: [phone]   Fax:  [phone]  Physical Therapy Treatment  Patient Details  Name: Savannah Hill MRN: [id number] Date of Birth: 1950/08/09 Referring Provider (PT): Dr. Megan Salon   Encounter Date: 04/28/2021   PT End of Session - 04/28/21 1312     Visit Number 39   kx   Number of Visits 25    Date for PT Re-Evaluation 05/17/21    PT Start Time 1001    PT Stop Time 1055    PT Time Calculation (min) 54 min    Activity Tolerance Patient tolerated treatment well    Behavior During Therapy [facility] for tasks assessed/performed             Past Medical History:  Diagnosis Date   Anemia    borderline   Atrial fibrillation ([place])    a. s/p ablation on 10/05/2016   Cancer ([place])    cervical/rad hysterectomy/bso wiht chemo for small cell ca   Dyspnea    Heart valve disorder    HLD (hyperlipidemia)    Hypertension    Joint pain    Lower extremity edema    Obesity    Palpitations     Past Surgical History:  Procedure Laterality Date   ABLATION OF DYSRHYTHMIC FOCUS  10/05/2016   ATRIAL FIBRILLATION ABLATION N/A 10/05/2016   Procedure: Atrial Fibrillation Ablation;  Surgeon: Constance Haw, MD;  Location: [facility];  Service: Cardiovascular;  Laterality: N/A;   IR RADIOLOGY PERIPHERAL GUIDED IV START  09/28/2016   IR US GUIDE VASC ACCESS RIGHT  09/28/2016   RADICAL ABDOMINAL HYSTERECTOMY  1986   with BSO   REPLACEMENT TOTAL KNEE Right 2022   TEE WITHOUT CARDIOVERSION N/A 06/07/2016   Procedure: TRANSESOPHAGEAL ECHOCARDIOGRAM (TEE);  Surgeon: Sanda Klein, MD;  Location: [facility] ENDOSCOPY;  Service: Cardiovascular;  Laterality: N/A;    There were no vitals filed for this visit.   Subjective Assessment - 04/28/21 1001     Subjective Bought a large pair of 30-40 compression thigh high and an extra large 20-30. They did not have an extra  large pair in 30-40 mm hg in [place].  I put on the 30-40 but it was really hard and it keps rolling down so I cut the top. I'm not sure I can deal with that.    Pertinent History radical hysterectomy in 1986 for small cell cervical endocrine cancer with 40 pelvic lymph nodes removed with chemo, no radiation.  She developed some swelling after the procedure but has noticed and increase as she has gotten older and with some weight gain.  She has a totat knee  June 2022 and has had an increase in lymphedema since.   She has an extremity  pump from [employer] that she got in June. She says she feel some fullness in her lower abdomen on the left side.  She has also used compression stockings. She had recent removal of basal cell carcinoma on left knee    Patient Stated Goals to be able to get her lymphedema under control    Currently in Pain? No/denies    Multiple Pain Sites No                               [facility] Adult PT Treatment/Exercise - 04/28/21 0001       Manual  Therapy   Manual therapy comments pt came in with class 2 thigh high where she had cut the top. showed pt how to don stocking using slipez and she found it much easier to do using gloves as well. Had the most difficulty with bunching at the ankle.  she had cut the silicone border and even placing half inch foam under border did not prevent stocking from rolling.  showed pt and husband where to search in computer for donning aids. [employer] had a great price for Slip EZ but it wasn't in stock. Removed stocking and pt was wrapped by PT.  Stocking not a good fit.  We will have her get measured next Wednesday to see what her options might be.    Compression Bandaging . TG soft, elastomull to toes 1-4, small piece of comprilan soft over big toe before toe wraps, comprilan soft 10 cm to knee, 15 cm to thigh from knee, 6 cm foot wrap with figure 8's, 8 cm heel locks, 12 cm wrap to knee, and 1 12 cm  mid leg to thigh, 1 12 cm wrap knee  to thigh, 1 12 cm above knee to thigh. TG soft pulled down over the top.                       PT Short Term Goals - 04/24/21 1225       PT SHORT TERM GOAL #1   Title Pt/husband will be able to perform self manual lymph drainage or report using pump with appropriate sleeve    Period Weeks    Status On-going      PT SHORT TERM GOAL #2   Title Pt/ husband will be able to do self compression bandaging    Status Achieved      PT SHORT TERM GOAL #3   Title Pt will report she knows how to use exercise to help control her lymphedema    Time 4    Period Weeks    Status On-going               PT Long Term Goals - 04/24/21 1225       PT LONG TERM GOAL #1   Title Pt will report she has or knows how to get appropriate day and nighttime garments to help control her lymphedema    Period Weeks    Status On-going      PT LONG TERM GOAL #2   Period Weeks    Status On-going                   Plan - 04/28/21 1318     Clinical Impression Statement Pt purchased a class 1 XL and class 2(L) stocking in [place] yesterday. She came in with the class 2 on, but had cut the silicone border because it was too tight and was rolling down.  Foam would not fix it.  Pt practiced donning the class 2 with the SlipEZ and using rubber gloves.  She had a much easier time of it, but had some difficulty with it around the ankle.  She may try the class 1 stocking on Sunday when she goes to church and will then have her husband wrap afterwards.  She is to be fit for the night device next Wed. and will be measured for stockings as well.    Personal Factors and Comorbidities Comorbidity 3+    Comorbidities chronic lymphedema, abdominal surgery for cervical cancer removal with  40 lymph nodes removed, right total knee in June 2022.    Examination-Activity Limitations Locomotion Level;Stairs    Stability/Clinical Decision Making Stable/Uncomplicated    Rehab Potential Excellent    PT  Frequency 3x / week    PT Duration 8 weeks    PT Treatment/Interventions ADLs/Self Care Home Management;Therapeutic exercise;Patient/family education;Orthotic Fit/Training;Manual techniques;Manual lymph drainage;Compression bandaging;Scar mobilization;Taping;Passive range of motion    PT Next Visit Plan continue MLD, compression bandaging, pts insurance covers Flexi 100%, remeasure Mon and consider measuring next week for garments, exercises    PT Home Exercise Plan husband bandaging, trunk MLD when using basic pump    Consulted and Agree with Plan of Care Patient    Family Member Consulted husband             Patient will benefit from skilled therapeutic intervention in order to improve the following deficits and impairments:  Increased edema, Decreased knowledge of use of DME, Decreased knowledge of precautions  Visit Diagnosis: Lymphedema, not elsewhere classified     Problem List Patient Active Problem List   Diagnosis Date Noted   Encounter for preoperative examination for general surgical procedure 08/18/2020   Insomnia due to anxiety and fear 08/18/2020   Stress at home- of MOM who is ETOH abuse 06/17/2019   Hyperlipidemia 06/17/2019   HLD (hyperlipidemia)    Obesity 12/05/2018   Vitamin D deficiency 12/05/2018   Postmenopausal 12/05/2018   Family history of diabetes mellitus in father 12/05/2018   Impaired fasting glucose 12/05/2018   Small cell carcinoma ([place])- of the cervix 12/05/2018   Insect bite 09/17/2018   Healthcare maintenance 09/17/2018   Generalized muscle ache 05/15/2018   Hip pain, bilateral 05/15/2018   Chronic pain of both shoulders 05/15/2018   Essential hypertension 01/29/2018   Insomnia 01/29/2018   Attention deficit hyperactivity disorder (ADHD) 01/29/2018   Mood disorder - mixed depression and anxiety 01/29/2018   Excessive drinking of alcohol 01/29/2018   Encounter for long-term (current) use of high-risk medication 01/29/2018   Inactivity  01/29/2018   Health education/counseling 01/29/2018   Chronic anticoagulation 10/06/2016   Paroxysmal atrial fibrillation ([facility]) 10/05/2016   Mitral valve disease    PAF (paroxysmal atrial fibrillation) ([place]) 07/01/2013   LAFB (left anterior fascicular block) 07/01/2013   Lymphedema 07/01/2013   Chest pain at rest 10/01/2011   Leg swelling 10/01/2011   h/o Cervical cancer 10/01/2011   H/O total hysterectomy 10/01/2011    Claris Pong, PT 04/28/2021, 1:25 PM  [facility] @ [place], [place], [address] Phone: [phone]   Fax:  [phone]  Name: Savannah Hill MRN: [id number] Date of Birth: 13-Jun-1950

## 2021-05-01 ENCOUNTER — Ambulatory Visit: Payer: Medicare Other

## 2021-05-01 ENCOUNTER — Other Ambulatory Visit: Payer: Self-pay

## 2021-05-01 DIAGNOSIS — I890 Lymphedema, not elsewhere classified: Secondary | ICD-10-CM | POA: Diagnosis not present

## 2021-05-01 NOTE — Therapy (Signed)
[facility] @ [place], [place], [address] Phone: [phone]   Fax:  [phone]  Physical Therapy Treatment  Patient Details  Name: Savannah Hill MRN: [id number] Date of Birth: [birth year] Referring Provider (PT): Dr. Megan Salon   Encounter Date: 05/01/2021   PT End of Session - 05/01/21 1159     Visit Number 18    Number of Visits 25    Date for PT Re-Evaluation 05/17/21    PT Start Time 1104    PT Stop Time 5102    PT Time Calculation (min) 52 min    Activity Tolerance Patient tolerated treatment well    Behavior During Therapy [facility] for tasks assessed/performed             Past Medical History:  Diagnosis Date   Anemia    borderline   Atrial fibrillation ([place])    a. s/p ablation on 10/05/2016   Cancer ([place])    cervical/rad hysterectomy/bso wiht chemo for small cell ca   Dyspnea    Heart valve disorder    HLD (hyperlipidemia)    Hypertension    Joint pain    Lower extremity edema    Obesity    Palpitations     Past Surgical History:  Procedure Laterality Date   ABLATION OF DYSRHYTHMIC FOCUS  10/05/2016   ATRIAL FIBRILLATION ABLATION N/A 10/05/2016   Procedure: Atrial Fibrillation Ablation;  Surgeon: Constance Haw, MD;  Location: [facility];  Service: Cardiovascular;  Laterality: N/A;   IR RADIOLOGY PERIPHERAL GUIDED IV START  09/28/2016   IR US GUIDE VASC ACCESS RIGHT  09/28/2016   RADICAL ABDOMINAL HYSTERECTOMY  1986   with BSO   REPLACEMENT TOTAL KNEE Right 2022   TEE WITHOUT CARDIOVERSION N/A 06/07/2016   Procedure: TRANSESOPHAGEAL ECHOCARDIOGRAM (TEE);  Surgeon: Sanda Klein, MD;  Location: [facility] ENDOSCOPY;  Service: Cardiovascular;  Laterality: N/A;    There were no vitals filed for this visit.   Subjective Assessment - 05/01/21 1105     Subjective I bought the EZ slide for the leg and some gloves . I wore bandages all weekend and tried the class 1 stocking  today.  Rick rebandaged me on Saturday night.    Pertinent History radical hysterectomy in 1986 for small cell cervical endocrine cancer with 40 pelvic lymph nodes removed with chemo, no radiation.  She developed some swelling after the procedure but has noticed and increase as she has gotten older and with some weight gain.  She has a totat knee  June 2022 and has had an increase in lymphedema since.   She has an extremity  pump from [employer] that she got in June. She says she feel some fullness in her lower abdomen on the left side.  She has also used compression stockings. She had recent removal of basal cell carcinoma on left knee    Patient Stated Goals to be able to get her lymphedema under control    Currently in Pain? No/denies                   LYMPHEDEMA/ONCOLOGY QUESTIONNAIRE - 05/01/21 0001       Right Lower Extremity Lymphedema   20 cm Proximal to Suprapatella 64.3 cm    10 cm Proximal to Suprapatella 58 cm    At Midpatella/Popliteal Crease 43.2 cm    30 cm Proximal to Floor at Lateral Plantar Foot 46.7 cm    20 cm  Proximal to Floor at Lateral Plantar Foot 40.5 1    10  cm Proximal to Floor at Lateral Malleoli 29.5 cm    Around Proximal Great Toe 8.2 cm                        [facility] Adult PT Treatment/Exercise - 05/01/21 0001       Manual Therapy   Manual therapy comments pt came in wearing class 1 Thigh high that she used EZ slide to don. Stocking continues to roll down on all sides of the upper thigh and foot is way too long for pt. Discussed looking at other options on Wednesday    Manual Lymphatic Drainage (MLD) continued MLD to short neck, Right axillary and inguinal LN's, 5 breaths, right inguino-axillary pathway, right lateral thigh, then medial to lateral and lateral thigh retracing pathway, then right lower leg front and back retracing steps and ending with LN's.    Compression Bandaging . TG soft, elastomull to toes 1-4, small piece of comprilan  soft over big toe before toe wraps, comprilan soft 10 cm to knee, 15 cm to thigh from knee, 6 cm foot wrap with figure 8's, 8 cm heel locks, 12 cm wrap to knee, and 1 12 cm  mid leg to thigh, 1 12 cm wrap knee to thigh, 1 12 cm above knee to thigh. TG soft pulled down over the top.                       PT Short Term Goals - 04/24/21 1225       PT SHORT TERM GOAL #1   Title Pt/husband will be able to perform self manual lymph drainage or report using pump with appropriate sleeve    Period Weeks    Status On-going      PT SHORT TERM GOAL #2   Title Pt/ husband will be able to do self compression bandaging    Status Achieved      PT SHORT TERM GOAL #3   Title Pt will report she knows how to use exercise to help control her lymphedema    Time 4    Period Weeks    Status On-going               PT Long Term Goals - 04/24/21 1225       PT LONG TERM GOAL #1   Title Pt will report she has or knows how to get appropriate day and nighttime garments to help control her lymphedema    Period Weeks    Status On-going      PT LONG TERM GOAL #2   Period Weeks    Status On-going                   Plan - 05/01/21 1201     Clinical Impression Statement pt came in wearing the Class 1 stocking from [place].  It is cutting in at her ankle, and rolling very badly at the top.  There does not seem to be a way to prevent rolling at the top.  Will look at flat knit options or other ready made stocking options. Continued MLD and compression bandaging.  Remeasured right leg with leg mostly stable. Slight increase at 30 cm proximal to floor.    Personal Factors and Comorbidities Comorbidity 3+    Comorbidities chronic lymphedema, abdominal surgery for cervical cancer removal with 40 lymph nodes removed, right total  knee in June 2022.    Examination-Activity Limitations Locomotion Level;Stairs    Stability/Clinical Decision Making Stable/Uncomplicated    Clinical Decision  Making Low    Rehab Potential Excellent    PT Frequency 3x / week    PT Duration 8 weeks    PT Treatment/Interventions ADLs/Self Care Home Management;Therapeutic exercise;Patient/family education;Orthotic Fit/Training;Manual techniques;Manual lymph drainage;Compression bandaging;Scar mobilization;Taping;Passive range of motion    PT Next Visit Plan continue MLD, compression bandaging, pts insurance covers Flexi 100%, remeasure Mon and consider measuring next week for garments, exercises    PT Home Exercise Plan husband bandaging, trunk MLD when using basic pump    Consulted and Agree with Plan of Care Patient             Patient will benefit from skilled therapeutic intervention in order to improve the following deficits and impairments:  Increased edema, Decreased knowledge of use of DME, Decreased knowledge of precautions  Visit Diagnosis: Lymphedema, not elsewhere classified     Problem List Patient Active Problem List   Diagnosis Date Noted   Encounter for preoperative examination for general surgical procedure 08/18/2020   Insomnia due to anxiety and fear 08/18/2020   Stress at home- of MOM who is ETOH abuse 06/17/2019   Hyperlipidemia 06/17/2019   HLD (hyperlipidemia)    Obesity 12/05/2018   Vitamin D deficiency 12/05/2018   Postmenopausal 12/05/2018   Family history of diabetes mellitus in father 12/05/2018   Impaired fasting glucose 12/05/2018   Small cell carcinoma ([place])- of the cervix 12/05/2018   Insect bite 09/17/2018   Healthcare maintenance 09/17/2018   Generalized muscle ache 05/15/2018   Hip pain, bilateral 05/15/2018   Chronic pain of both shoulders 05/15/2018   Essential hypertension 01/29/2018   Insomnia 01/29/2018   Attention deficit hyperactivity disorder (ADHD) 01/29/2018   Mood disorder - mixed depression and anxiety 01/29/2018   Excessive drinking of alcohol 01/29/2018   Encounter for long-term (current) use of high-risk medication 01/29/2018    Inactivity 01/29/2018   Health education/counseling 01/29/2018   Chronic anticoagulation 10/06/2016   Paroxysmal atrial fibrillation (HCC) 10/05/2016   Mitral valve disease    PAF (paroxysmal atrial fibrillation) ([place]) 07/01/2013   LAFB (left anterior fascicular block) 07/01/2013   Lymphedema 07/01/2013   Chest pain at rest 10/01/2011   Leg swelling 10/01/2011   h/o Cervical cancer 10/01/2011   H/O total hysterectomy 10/01/2011    Claris Pong, PT 05/01/2021, 12:05 PM  [facility] @ [place], [place], [address] Phone: [phone]   Fax:  [phone]  Name: Savannah Hill MRN: [id number] Date of Birth: 1950/10/23

## 2021-05-03 ENCOUNTER — Ambulatory Visit: Payer: Medicare Other

## 2021-05-03 ENCOUNTER — Other Ambulatory Visit: Payer: Self-pay

## 2021-05-03 DIAGNOSIS — I890 Lymphedema, not elsewhere classified: Secondary | ICD-10-CM

## 2021-05-03 NOTE — Therapy (Signed)
[facility] @ [address] [place], [place], [address] Phone: [phone]   Fax:  [phone]  Physical Therapy Treatment  Patient Details  Name: Savannah Hill MRN: [id number] Date of Birth: May 20, 1950 Referring Provider (PT): Dr. Megan Salon   Encounter Date: 05/03/2021   PT End of Session - 05/03/21 1645     Visit Number 19    Number of Visits 25    Date for PT Re-Evaluation 05/17/21    PT Start Time 7169    PT Stop Time 6789    PT Time Calculation (min) 51 min    Activity Tolerance Patient tolerated treatment well    Behavior During Therapy [facility] for tasks assessed/performed             Past Medical History:  Diagnosis Date   Anemia    borderline   Atrial fibrillation ([place])    a. s/p ablation on 10/05/2016   Cancer ([place])    cervical/rad hysterectomy/bso wiht chemo for small cell ca   Dyspnea    Heart valve disorder    HLD (hyperlipidemia)    Hypertension    Joint pain    Lower extremity edema    Obesity    Palpitations     Past Surgical History:  Procedure Laterality Date   ABLATION OF DYSRHYTHMIC FOCUS  10/05/2016   ATRIAL FIBRILLATION ABLATION N/A 10/05/2016   Procedure: Atrial Fibrillation Ablation;  Surgeon: Constance Haw, MD;  Location: [facility];  Service: Cardiovascular;  Laterality: N/A;   IR RADIOLOGY PERIPHERAL GUIDED IV START  09/28/2016   IR US GUIDE VASC ACCESS RIGHT  09/28/2016   RADICAL ABDOMINAL HYSTERECTOMY  1986   with BSO   REPLACEMENT TOTAL KNEE Right 2022   TEE WITHOUT CARDIOVERSION N/A 06/07/2016   Procedure: TRANSESOPHAGEAL ECHOCARDIOGRAM (TEE);  Surgeon: Sanda Klein, MD;  Location: [facility] ENDOSCOPY;  Service: Cardiovascular;  Laterality: N/A;    There were no vitals filed for this visit.   Subjective Assessment - 05/03/21 1548     Subjective Came in wrapped to be measured by Sharrie Rothman from [facility]    Pertinent History radical hysterectomy in 1986 for  small cell cervical endocrine cancer with 40 pelvic lymph nodes removed with chemo, no radiation.  She developed some swelling after the procedure but has noticed and increase as she has gotten older and with some weight gain.  She has a totat knee  June 2022 and has had an increase in lymphedema since.   She has an extremity  pump from [employer] that she got in June. She says she feel some fullness in her lower abdomen on the left side.  She has also used compression stockings. She had recent removal of basal cell carcinoma on left knee                               [facility] Adult PT Treatment/Exercise - 05/03/21 0001       Manual Therapy   Manual therapy comments pt measured by Sharrie Rothman from [facility]    Manual Lymphatic Drainage (MLD) continued MLD to short neck, Right axillary and inguinal LN's, 5 breaths, right inguino-axillary pathway, right lateral thigh, then medial to lateral and lateral thigh retracing pathway, then right lower leg front and back retracing steps and ending with LN's.    Compression Bandaging . TG soft, elastomull to toes 1-4, small piece of comprilan soft over big  toe before toe wraps, comprilan soft 10 cm to knee, 15 cm to thigh from knee, 6 cm foot wrap with figure 8's, 8 cm heel locks, 12 cm wrap to knee, and 1 12 cm  mid leg to thigh, 1 12 cm wrap knee to thigh, 1 12 cm above knee to thigh. TG soft pulled down over the top.                       PT Short Term Goals - 04/24/21 1225       PT SHORT TERM GOAL #1   Title Pt/husband will be able to perform self manual lymph drainage or report using pump with appropriate sleeve    Period Weeks    Status On-going      PT SHORT TERM GOAL #2   Title Pt/ husband will be able to do self compression bandaging    Status Achieved      PT SHORT TERM GOAL #3   Title Pt will report she knows how to use exercise to help control her lymphedema    Time 4    Period Weeks    Status On-going                PT Long Term Goals - 04/24/21 1225       PT LONG TERM GOAL #1   Title Pt will report she has or knows how to get appropriate day and nighttime garments to help control her lymphedema    Period Weeks    Status On-going      PT LONG TERM GOAL #2   Period Weeks    Status On-going                   Plan - 05/03/21 1648     Clinical Impression Statement Pt was measured by Sharrie Rothman from [facility] prior to her appt with me. Continued MLD and compression bandaging was performed by PT.  Pts wrap had slid down today so thigh may have been more swollen.  She fits into an exostrong ready made stocking and was measured for [employer] Medaform night garment.  Leg continues to be much softer.    Personal Factors and Comorbidities Comorbidity 3+    Comorbidities chronic lymphedema, abdominal surgery for cervical cancer removal with 40 lymph nodes removed, right total knee in June 2022.    Examination-Activity Limitations Locomotion Level;Stairs    Stability/Clinical Decision Making Stable/Uncomplicated    Rehab Potential Excellent    PT Frequency 3x / week    PT Duration 8 weeks    PT Treatment/Interventions ADLs/Self Care Home Management;Therapeutic exercise;Patient/family education;Orthotic Fit/Training;Manual techniques;Manual lymph drainage;Compression bandaging;Scar mobilization;Taping;Passive range of motion    PT Next Visit Plan continue MLD, compression bandaging, pts insurance covers Flexi 100%,    PT Home Exercise Plan husband bandaging, trunk MLD when using basic pump    Consulted and Agree with Plan of Care Patient             Patient will benefit from skilled therapeutic intervention in order to improve the following deficits and impairments:  Increased edema, Decreased knowledge of use of DME, Decreased knowledge of precautions  Visit Diagnosis: Lymphedema, not elsewhere classified     Problem List Patient Active Problem List   Diagnosis Date Noted   Encounter for  preoperative examination for general surgical procedure 08/18/2020   Insomnia due to anxiety and fear 08/18/2020   Stress at home- of MOM who is ETOH abuse  06/17/2019   Hyperlipidemia 06/17/2019   HLD (hyperlipidemia)    Obesity 12/05/2018   Vitamin D deficiency 12/05/2018   Postmenopausal 12/05/2018   Family history of diabetes mellitus in father 12/05/2018   Impaired fasting glucose 12/05/2018   Small cell carcinoma ([place])- of the cervix 12/05/2018   Insect bite 09/17/2018   Healthcare maintenance 09/17/2018   Generalized muscle ache 05/15/2018   Hip pain, bilateral 05/15/2018   Chronic pain of both shoulders 05/15/2018   Essential hypertension 01/29/2018   Insomnia 01/29/2018   Attention deficit hyperactivity disorder (ADHD) 01/29/2018   Mood disorder - mixed depression and anxiety 01/29/2018   Excessive drinking of alcohol 01/29/2018   Encounter for long-term (current) use of high-risk medication 01/29/2018   Inactivity 01/29/2018   Health education/counseling 01/29/2018   Chronic anticoagulation 10/06/2016   Paroxysmal atrial fibrillation ([place]) 10/05/2016   Mitral valve disease    PAF (paroxysmal atrial fibrillation) ([place]) 07/01/2013   LAFB (left anterior fascicular block) 07/01/2013   Lymphedema 07/01/2013   Chest pain at rest 10/01/2011   Leg swelling 10/01/2011   h/o Cervical cancer 10/01/2011   H/O total hysterectomy 10/01/2011    Claris Pong, PT 05/03/2021, 4:52 PM  [facility] @ [address] [place], [place], [address] Phone: [phone]   Fax:  [phone]  Name: Savannah Hill MRN: [id number] Date of Birth: 07/12/1950

## 2021-05-05 ENCOUNTER — Ambulatory Visit: Payer: Medicare Other

## 2021-05-05 ENCOUNTER — Other Ambulatory Visit: Payer: Self-pay

## 2021-05-05 DIAGNOSIS — I890 Lymphedema, not elsewhere classified: Secondary | ICD-10-CM | POA: Diagnosis not present

## 2021-05-05 NOTE — Therapy (Signed)
[facility] @ [address] [place] [place], [place], [address] Phone: [phone]   Fax:  [phone]  Physical Therapy Treatment  Patient Details  Name: Savannah Hill MRN: [id number] Date of Birth: May 30, 1950 Referring Provider (PT): Dr. Megan Salon   Encounter Date: 05/05/2021   PT End of Session - 05/05/21 1102     Visit Number 20    Number of Visits 25    Date for PT Re-Evaluation 05/17/21    PT Start Time 1002    PT Stop Time 1055    PT Time Calculation (min) 53 min    Activity Tolerance Patient tolerated treatment well    Behavior During Therapy [facility] for tasks assessed/performed             Past Medical History:  Diagnosis Date   Anemia    borderline   Atrial fibrillation ([place])    a. s/p ablation on 10/05/2016   Cancer ([place])    cervical/rad hysterectomy/bso wiht chemo for small cell ca   Dyspnea    Heart valve disorder    HLD (hyperlipidemia)    Hypertension    Joint pain    Lower extremity edema    Obesity    Palpitations     Past Surgical History:  Procedure Laterality Date   ABLATION OF DYSRHYTHMIC FOCUS  10/05/2016   ATRIAL FIBRILLATION ABLATION N/A 10/05/2016   Procedure: Atrial Fibrillation Ablation;  Surgeon: Constance Haw, MD;  Location: [facility];  Service: Cardiovascular;  Laterality: N/A;   IR RADIOLOGY PERIPHERAL GUIDED IV START  09/28/2016   IR US GUIDE VASC ACCESS RIGHT  09/28/2016   RADICAL ABDOMINAL HYSTERECTOMY  1986   with BSO   REPLACEMENT TOTAL KNEE Right 2022   TEE WITHOUT CARDIOVERSION N/A 06/07/2016   Procedure: TRANSESOPHAGEAL ECHOCARDIOGRAM (TEE);  Surgeon: Sanda Klein, MD;  Location: [facility] ENDOSCOPY;  Service: Cardiovascular;  Laterality: N/A;    There were no vitals filed for this visit.   Subjective Assessment - 05/05/21 1007     Subjective I have the light stocking on today. It doesn't roll down at the top like the others did but its got a larger  border at the top    Pertinent History radical hysterectomy in 1986 for small cell cervical endocrine cancer with 40 pelvic lymph nodes removed with chemo, no radiation.  She developed some swelling after the procedure but has noticed and increase as she has gotten older and with some weight gain.  She has a totat knee  June 2022 and has had an increase in lymphedema since.   She has an extremity  pump from [employer] that she got in June. She says she feel some fullness in her lower abdomen on the left side.  She has also used compression stockings. She had recent removal of basal cell carcinoma on left knee    Patient Stated Goals to be able to get her lymphedema under control    Currently in Pain? No/denies    Multiple Pain Sites No                                          PT Short Term Goals - 04/24/21 1225       PT SHORT TERM GOAL #1   Title Pt/husband will be able to perform self manual lymph drainage or report using pump  with appropriate sleeve    Period Weeks    Status On-going      PT SHORT TERM GOAL #2   Title Pt/ husband will be able to do self compression bandaging    Status Achieved      PT SHORT TERM GOAL #3   Title Pt will report she knows how to use exercise to help control her lymphedema    Time 4    Period Weeks    Status On-going               PT Long Term Goals - 04/24/21 1225       PT LONG TERM GOAL #1   Title Pt will report she has or knows how to get appropriate day and nighttime garments to help control her lymphedema    Period Weeks    Status achieved      PT LONG TERM GOAL #2   Period Weeks    Status On-going                   Plan - 05/05/21 1104     Clinical Impression Statement Pt came in with her lighter compression stocking on. It stayed up very well at the top without rolling.  Dorsum of foot looked very good as did her leg throughout.  She was fit for new garments on Wednesday and is awaiting those.   Continued Manual lymphatic drainage and compression bandaging. Pt and her husband are very compliant.    Personal Factors and Comorbidities Comorbidity 3+    Comorbidities chronic lymphedema, abdominal surgery for cervical cancer removal with 40 lymph nodes removed, right total knee in June 2022.    Examination-Activity Limitations Locomotion Level;Stairs    Stability/Clinical Decision Making Stable/Uncomplicated    Rehab Potential Excellent    PT Frequency 3x / week    PT Duration 8 weeks    PT Treatment/Interventions ADLs/Self Care Home Management;Therapeutic exercise;Patient/family education;Orthotic Fit/Training;Manual techniques;Manual lymph drainage;Compression bandaging;Scar mobilization;Taping;Passive range of motion    PT Next Visit Plan continue MLD, compression bandaging, pts insurance covers Flexi 100%,    PT Home Exercise Plan husband bandaging, trunk MLD when using basic pump    Recommended Other Services awaiting night and day garment,flexitouch    Consulted and Agree with Plan of Care Patient             Patient will benefit from skilled therapeutic intervention in order to improve the following deficits and impairments:  Increased edema, Decreased knowledge of use of DME, Decreased knowledge of precautions  Visit Diagnosis: Lymphedema, not elsewhere classified     Problem List Patient Active Problem List   Diagnosis Date Noted   Encounter for preoperative examination for general surgical procedure 08/18/2020   Insomnia due to anxiety and fear 08/18/2020   Stress at home- of MOM who is ETOH abuse 06/17/2019   Hyperlipidemia 06/17/2019   HLD (hyperlipidemia)    Obesity 12/05/2018   Vitamin D deficiency 12/05/2018   Postmenopausal 12/05/2018   Family history of diabetes mellitus in father 12/05/2018   Impaired fasting glucose 12/05/2018   Small cell carcinoma ([place])- of the cervix 12/05/2018   Insect bite 09/17/2018   Healthcare maintenance 09/17/2018    Generalized muscle ache 05/15/2018   Hip pain, bilateral 05/15/2018   Chronic pain of both shoulders 05/15/2018   Essential hypertension 01/29/2018   Insomnia 01/29/2018   Attention deficit hyperactivity disorder (ADHD) 01/29/2018   Mood disorder - mixed depression and anxiety 01/29/2018  Excessive drinking of alcohol 01/29/2018   Encounter for long-term (current) use of high-risk medication 01/29/2018   Inactivity 01/29/2018   Health education/counseling 01/29/2018   Chronic anticoagulation 10/06/2016   Paroxysmal atrial fibrillation ([place]) 10/05/2016   Mitral valve disease    PAF (paroxysmal atrial fibrillation) ([place]) 07/01/2013   LAFB (left anterior fascicular block) 07/01/2013   Lymphedema 07/01/2013   Chest pain at rest 10/01/2011   Leg swelling 10/01/2011   h/o Cervical cancer 10/01/2011   H/O total hysterectomy 10/01/2011    Claris Pong, PT 05/05/2021, 11:07 AM  [facility] @ [address] [place], [place], [address] Phone: [phone]   Fax:  [phone]  Name: ALAZNE QUANT MRN: [id number] Date of Birth: April 19, 1950

## 2021-05-08 ENCOUNTER — Ambulatory Visit: Payer: Medicare Other

## 2021-05-08 ENCOUNTER — Other Ambulatory Visit: Payer: Self-pay

## 2021-05-08 DIAGNOSIS — I890 Lymphedema, not elsewhere classified: Secondary | ICD-10-CM | POA: Diagnosis not present

## 2021-05-08 NOTE — Therapy (Signed)
[facility] @ [address] [place], [place], [address] Phone: [phone]   Fax:  [phone]  Physical Therapy Treatment  Patient Details  Name: Savannah Hill MRN: [id number] Date of Birth: 1950-03-28 Referring Provider (PT): Dr. Megan Salon   Encounter Date: 05/08/2021   PT End of Session - 05/08/21 1005     Visit Number 21    Number of Visits 25    Date for PT Re-Evaluation 05/17/21    PT Start Time 1005    PT Stop Time 1049    PT Time Calculation (min) 44 min    Activity Tolerance Patient tolerated treatment well    Behavior During Therapy [facility] for tasks assessed/performed             Past Medical History:  Diagnosis Date   Anemia    borderline   Atrial fibrillation ([place])    a. s/p ablation on 10/05/2016   Cancer ([place])    cervical/rad hysterectomy/bso wiht chemo for small cell ca   Dyspnea    Heart valve disorder    HLD (hyperlipidemia)    Hypertension    Joint pain    Lower extremity edema    Obesity    Palpitations     Past Surgical History:  Procedure Laterality Date   ABLATION OF DYSRHYTHMIC FOCUS  10/05/2016   ATRIAL FIBRILLATION ABLATION N/A 10/05/2016   Procedure: Atrial Fibrillation Ablation;  Surgeon: Constance Haw, MD;  Location: [facility];  Service: Cardiovascular;  Laterality: N/A;   IR RADIOLOGY PERIPHERAL GUIDED IV START  09/28/2016   IR US GUIDE VASC ACCESS RIGHT  09/28/2016   RADICAL ABDOMINAL HYSTERECTOMY  1986   with BSO   REPLACEMENT TOTAL KNEE Right 2022   TEE WITHOUT CARDIOVERSION N/A 06/07/2016   Procedure: TRANSESOPHAGEAL ECHOCARDIOGRAM (TEE);  Surgeon: Sanda Klein, MD;  Location: [facility] ENDOSCOPY;  Service: Cardiovascular;  Laterality: N/A;    There were no vitals filed for this visit.   Subjective Assessment - 05/08/21 1002     Subjective I have the stocking on today. I ended up wearing the 10-20 mmmg stocking to the party on Saturday night.  Rick  wrapped yesterday morning at 7:30.    Patient is accompained by: Family member    Pertinent History radical hysterectomy in 1986 for small cell cervical endocrine cancer with 40 pelvic lymph nodes removed with chemo, no radiation.  She developed some swelling after the procedure but has noticed and increase as she has gotten older and with some weight gain.  She has a totat knee  June 2022 and has had an increase in lymphedema since.   She has an extremity  pump from [employer] that she got in June. She says she feel some fullness in her lower abdomen on the left side.  She has also used compression stockings. She had recent removal of basal cell carcinoma on left knee    Patient Stated Goals to be able to get her lymphedema under control    Currently in Pain? No/denies    Multiple Pain Sites No                   LYMPHEDEMA/ONCOLOGY QUESTIONNAIRE - 05/08/21 0001       Right Lower Extremity Lymphedema   Other 121 cm   measured at hips                       [facility] Adult  PT Treatment/Exercise - 05/08/21 0001       Manual Therapy   Manual Lymphatic Drainage (MLD) continued MLD to short neck, Right axillary and inguinal LN's, 5 breaths, right inguino-axillary pathway, right lateral thigh, then medial to lateral and lateral thigh retracing pathway, then right lower leg front and back retracing steps and ending with LN's.    Compression Bandaging . TG soft, elastomull to toes 1-4, small piece of comprilan soft over big toe before toe wraps, comprilan soft 10 cm to knee, 15 cm to thigh from knee, 6 cm foot wrap with figure 8's, 8 cm heel locks, 12 cm wrap to knee, and 1 12 cm  mid leg to thigh, 1 12 cm wrap knee to thigh, 1 12 cm above knee to thigh. TG soft pulled down over the top.                       PT Short Term Goals - 04/24/21 1225       PT SHORT TERM GOAL #1   Title Pt/husband will be able to perform self manual lymph drainage or report using pump with  appropriate sleeve    Period Weeks    Status On-going      PT SHORT TERM GOAL #2   Title Pt/ husband will be able to do self compression bandaging    Status Achieved      PT SHORT TERM GOAL #3   Title Pt will report she knows how to use exercise to help control her lymphedema    Time 4    Period Weeks    Status On-going               PT Long Term Goals - 04/24/21 1225       PT LONG TERM GOAL #1   Title Pt will report she has or knows how to get appropriate day and nighttime garments to help control her lymphedema    Period Weeks    Status achieved      PT LONG TERM GOAL #2   Period Weeks    Status On-going                   Plan - 05/08/21 1210     Clinical Impression Statement Continued Manual lymphatic drainage and compression bandaging to reduce/maintain edema while pt is awaiting her new garments.Pt has had 4 full weeks of conservative therapies including compression, MLD, elevation and exercise for her chronic LE lymphedema due sto small cell cervical cancer and the removal of 40 pelvic LN's.  Despite consistent use of compression, the basic pump, and MLD her symptoms persist. Leg texture is much improved overall, however she does continue with hyperkeratosis and hyperplasia lymphostatic lymphedema.. She has used the [employer] [phone] basic compression pump daily for many months at 50 mmhg and yet swelling remains and interferes with her normal daily activities. Hip measurement taken today is 121 cm and is basically unchanged. The Flexitouch pump is medically necessary for her to avoid congestion in the pelvic/abdominal area and to  maintain/reduce her LE swelling which will reduce her risk for infection and will enable her to have a more functional lifestyle    Personal Factors and Comorbidities Comorbidity 3+    Comorbidities chronic lymphedema, abdominal surgery for cervical cancer removal with 40 lymph nodes removed, right total knee in June 2022.     Examination-Activity Limitations Locomotion Level;Stairs    Stability/Clinical Decision Making Stable/Uncomplicated  Rehab Potential Excellent    PT Frequency 3x / week    PT Duration 8 weeks    PT Treatment/Interventions ADLs/Self Care Home Management;Therapeutic exercise;Patient/family education;Orthotic Fit/Training;Manual techniques;Manual lymph drainage;Compression bandaging;Scar mobilization;Taping;Passive range of motion    PT Next Visit Plan continue MLD, compression bandaging, ,    PT Home Exercise Plan husband bandaging, trunk MLD when using basic pump    Consulted and Agree with Plan of Care Patient             Patient will benefit from skilled therapeutic intervention in order to improve the following deficits and impairments:  Increased edema, Decreased knowledge of use of DME, Decreased knowledge of precautions  Visit Diagnosis: Lymphedema, not elsewhere classified     Problem List Patient Active Problem List   Diagnosis Date Noted   Encounter for preoperative examination for general surgical procedure 08/18/2020   Insomnia due to anxiety and fear 08/18/2020   Stress at home- of MOM who is ETOH abuse 06/17/2019   Hyperlipidemia 06/17/2019   HLD (hyperlipidemia)    Obesity 12/05/2018   Vitamin D deficiency 12/05/2018   Postmenopausal 12/05/2018   Family history of diabetes mellitus in father 12/05/2018   Impaired fasting glucose 12/05/2018   Small cell carcinoma ([place])- of the cervix 12/05/2018   Insect bite 09/17/2018   Healthcare maintenance 09/17/2018   Generalized muscle ache 05/15/2018   Hip pain, bilateral 05/15/2018   Chronic pain of both shoulders 05/15/2018   Essential hypertension 01/29/2018   Insomnia 01/29/2018   Attention deficit hyperactivity disorder (ADHD) 01/29/2018   Mood disorder - mixed depression and anxiety 01/29/2018   Excessive drinking of alcohol 01/29/2018   Encounter for long-term (current) use of high-risk medication 01/29/2018    Inactivity 01/29/2018   Health education/counseling 01/29/2018   Chronic anticoagulation 10/06/2016   Paroxysmal atrial fibrillation ([facility]) 10/05/2016   Mitral valve disease    PAF (paroxysmal atrial fibrillation) ([place]) 07/01/2013   LAFB (left anterior fascicular block) 07/01/2013   Lymphedema 07/01/2013   Chest pain at rest 10/01/2011   Leg swelling 10/01/2011   h/o Cervical cancer 10/01/2011   H/O total hysterectomy 10/01/2011    Claris Pong, PT 05/08/2021, 12:14 PM  [facility] @ [place], [place], [address] Phone: [phone]   Fax:  [phone]  Name: MARIELIZ STRANG MRN: [id number] Date of Birth: 1950-06-26

## 2021-05-10 ENCOUNTER — Ambulatory Visit: Payer: Medicare Other

## 2021-05-10 ENCOUNTER — Other Ambulatory Visit: Payer: Self-pay

## 2021-05-10 DIAGNOSIS — I890 Lymphedema, not elsewhere classified: Secondary | ICD-10-CM | POA: Diagnosis not present

## 2021-05-10 NOTE — Therapy (Signed)
[facility] @ [place], [place], [address] Phone: [phone]   Fax:  [phone]  Physical Therapy Treatment  Patient Details  Name: Savannah Hill MRN: [id number] Date of Birth: 1950/12/08 Referring Provider (PT): Dr. Megan Salon   Encounter Date: 05/10/2021   PT End of Session - 05/10/21 0957     Visit Number 22    Number of Visits 25    Date for PT Re-Evaluation 05/17/21    PT Start Time 0900    PT Stop Time 0954    PT Time Calculation (min) 54 min    Activity Tolerance Patient tolerated treatment well    Behavior During Therapy [facility] for tasks assessed/performed             Past Medical History:  Diagnosis Date   Anemia    borderline   Atrial fibrillation ([place])    a. s/p ablation on 10/05/2016   Cancer ([place])    cervical/rad hysterectomy/bso wiht chemo for small cell ca   Dyspnea    Heart valve disorder    HLD (hyperlipidemia)    Hypertension    Joint pain    Lower extremity edema    Obesity    Palpitations     Past Surgical History:  Procedure Laterality Date   ABLATION OF DYSRHYTHMIC FOCUS  10/05/2016   ATRIAL FIBRILLATION ABLATION N/A 10/05/2016   Procedure: Atrial Fibrillation Ablation;  Surgeon: Constance Haw, MD;  Location: [facility];  Service: Cardiovascular;  Laterality: N/A;   IR RADIOLOGY PERIPHERAL GUIDED IV START  09/28/2016   IR US GUIDE VASC ACCESS RIGHT  09/28/2016   RADICAL ABDOMINAL HYSTERECTOMY  1986   with BSO   REPLACEMENT TOTAL KNEE Right 2022   TEE WITHOUT CARDIOVERSION N/A 06/07/2016   Procedure: TRANSESOPHAGEAL ECHOCARDIOGRAM (TEE);  Surgeon: Sanda Klein, MD;  Location: [facility] ENDOSCOPY;  Service: Cardiovascular;  Laterality: N/A;    There were no vitals filed for this visit.   Subjective Assessment - 05/10/21 0858     Subjective Removed wraps to shower and have my light stocking on    Pertinent History radical hysterectomy in 1986 for  small cell cervical endocrine cancer with 40 pelvic lymph nodes removed with chemo, no radiation.  She developed some swelling after the procedure but has noticed and increase as she has gotten older and with some weight gain.  She has a totat knee  June 2022 and has had an increase in lymphedema since.   She has an extremity  pump from [employer] that she got in June. She says she feel some fullness in her lower abdomen on the left side.  She has also used compression stockings. She had recent removal of basal cell carcinoma on left knee                   LYMPHEDEMA/ONCOLOGY QUESTIONNAIRE - 05/10/21 0001       Right Lower Extremity Lymphedema   10 cm Proximal to Suprapatella 57 cm (P)     At Midpatella/Popliteal Crease 43.2 cm (P)     30 cm Proximal to Floor at Lateral Plantar Foot 48 cm (P)     20 cm Proximal to Floor at Lateral Plantar Foot 43.5 1 (P)     10 cm Proximal to Floor at Lateral Malleoli 30 cm (P)     5 cm Proximal to 1st MTP Joint 23 cm (P)     Around Proximal Great Toe  8.3 cm (P)                         [facility] Adult PT Treatment/Exercise - 05/10/21 0001       Manual Therapy   Manual therapy comments pt remeasured    Manual Lymphatic Drainage (MLD) continued MLD to short neck, Right axillary and inguinal LN's, 5 breaths, right inguino-axillary pathway, right lateral thigh, then medial to lateral and lateral thigh retracing pathway, then right lower leg front and back retracing steps and ending with LN's.    Compression Bandaging . TG soft, elastomull to toes 1-4, small piece of comprilan soft over big toe before toe wraps, comprilan soft 10 cm to knee, 15 cm to thigh from knee, 6 cm foot wrap with figure 8's, 8 cm heel locks, 12 cm wrap to knee, and 1 12 cm  mid leg to thigh, 1 12 cm wrap knee to thigh, 1 12 cm above knee to thigh. TG soft pulled down over the top.                       PT Short Term Goals - 04/24/21 1225       PT SHORT TERM  GOAL #1   Title Pt/husband will be able to perform self manual lymph drainage or report using pump with appropriate sleeve    Period Weeks    Status On-going      PT SHORT TERM GOAL #2   Title Pt/ husband will be able to do self compression bandaging    Status Achieved      PT SHORT TERM GOAL #3   Title Pt will report she knows how to use exercise to help control her lymphedema    Time 4    Period Weeks    Status On-going               PT Long Term Goals - 04/24/21 1225       PT LONG TERM GOAL #1   Title Pt will report she has or knows how to get appropriate day and nighttime garments to help control her lymphedema    Period Weeks    Status achieved     PT LONG TERM GOAL #2   Period Weeks    Status On-going                   Plan - 05/10/21 3557     Clinical Impression Statement Pt was remeasured today with slight increases in lower leg.  Still awaiting approval from [employer] and stockings to be ordered. Contjnued MLD and compression bandaging.  Pt has good tolerance for wraps.    Personal Factors and Comorbidities Comorbidity 3+    Comorbidities chronic lymphedema, abdominal surgery for cervical cancer removal with 40 lymph nodes removed, right total knee in June 2022.    Examination-Activity Limitations Locomotion Level;Stairs    Stability/Clinical Decision Making Stable/Uncomplicated    Rehab Potential Excellent    PT Frequency 3x / week    PT Duration 8 weeks    PT Treatment/Interventions ADLs/Self Care Home Management;Therapeutic exercise;Patient/family education;Orthotic Fit/Training;Manual techniques;Manual lymph drainage;Compression bandaging;Scar mobilization;Taping;Passive range of motion    PT Next Visit Plan continue MLD, compression bandaging, ,    PT Home Exercise Plan husband bandaging, trunk MLD when using basic pump    Consulted and Agree with Plan of Care Patient  Patient will benefit from skilled therapeutic intervention in  order to improve the following deficits and impairments:  Increased edema, Decreased knowledge of use of DME, Decreased knowledge of precautions  Visit Diagnosis: Lymphedema, not elsewhere classified     Problem List Patient Active Problem List   Diagnosis Date Noted   Encounter for preoperative examination for general surgical procedure 08/18/2020   Insomnia due to anxiety and fear 08/18/2020   Stress at home- of MOM who is ETOH abuse 06/17/2019   Hyperlipidemia 06/17/2019   HLD (hyperlipidemia)    Obesity 12/05/2018   Vitamin D deficiency 12/05/2018   Postmenopausal 12/05/2018   Family history of diabetes mellitus in father 12/05/2018   Impaired fasting glucose 12/05/2018   Small cell carcinoma ([place])- of the cervix 12/05/2018   Insect bite 09/17/2018   Healthcare maintenance 09/17/2018   Generalized muscle ache 05/15/2018   Hip pain, bilateral 05/15/2018   Chronic pain of both shoulders 05/15/2018   Essential hypertension 01/29/2018   Insomnia 01/29/2018   Attention deficit hyperactivity disorder (ADHD) 01/29/2018   Mood disorder - mixed depression and anxiety 01/29/2018   Excessive drinking of alcohol 01/29/2018   Encounter for long-term (current) use of high-risk medication 01/29/2018   Inactivity 01/29/2018   Health education/counseling 01/29/2018   Chronic anticoagulation 10/06/2016   Paroxysmal atrial fibrillation (HCC) 10/05/2016   Mitral valve disease    PAF (paroxysmal atrial fibrillation) ([place]) 07/01/2013   LAFB (left anterior fascicular block) 07/01/2013   Lymphedema 07/01/2013   Chest pain at rest 10/01/2011   Leg swelling 10/01/2011   h/o Cervical cancer 10/01/2011   H/O total hysterectomy 10/01/2011    Claris Pong, PT 05/10/2021, 11:04 AM  [facility] @ [place], [place], [address] Phone: [phone]   Fax:  [phone]  Name: Savannah Hill MRN: [id number] Date of Birth:  [birth year]

## 2021-05-12 ENCOUNTER — Other Ambulatory Visit: Payer: Self-pay

## 2021-05-12 ENCOUNTER — Ambulatory Visit: Payer: Medicare Other

## 2021-05-12 DIAGNOSIS — I890 Lymphedema, not elsewhere classified: Secondary | ICD-10-CM | POA: Diagnosis not present

## 2021-05-12 NOTE — Therapy (Signed)
[facility] @ [place], [place], [address] Phone: [phone]   Fax:  [phone]  Physical Therapy Treatment  Patient Details  Name: Savannah Hill MRN: [id number] Date of Birth: 1951/01/17 Referring Provider (PT): Dr. Megan Salon   Encounter Date: 05/12/2021   PT End of Session - 05/12/21 1006     Visit Number 23    Number of Visits 25    Date for PT Re-Evaluation 05/17/21    PT Start Time 1004    PT Stop Time 1055    PT Time Calculation (min) 51 min    Activity Tolerance Patient tolerated treatment well    Behavior During Therapy [facility] for tasks assessed/performed             Past Medical History:  Diagnosis Date   Anemia    borderline   Atrial fibrillation ([place])    a. s/p ablation on 10/05/2016   Cancer ([place])    cervical/rad hysterectomy/bso wiht chemo for small cell ca   Dyspnea    Heart valve disorder    HLD (hyperlipidemia)    Hypertension    Joint pain    Lower extremity edema    Obesity    Palpitations     Past Surgical History:  Procedure Laterality Date   ABLATION OF DYSRHYTHMIC FOCUS  10/05/2016   ATRIAL FIBRILLATION ABLATION N/A 10/05/2016   Procedure: Atrial Fibrillation Ablation;  Surgeon: Constance Haw, MD;  Location: [facility];  Service: Cardiovascular;  Laterality: N/A;   IR RADIOLOGY PERIPHERAL GUIDED IV START  09/28/2016   IR US GUIDE VASC ACCESS RIGHT  09/28/2016   RADICAL ABDOMINAL HYSTERECTOMY  1986   with BSO   REPLACEMENT TOTAL KNEE Right 2022   TEE WITHOUT CARDIOVERSION N/A 06/07/2016   Procedure: TRANSESOPHAGEAL ECHOCARDIOGRAM (TEE);  Surgeon: Sanda Klein, MD;  Location: [facility];  Service: Cardiovascular;  Laterality: N/A;    There were no vitals filed for this visit.   Subjective Assessment - 05/12/21 1006     Subjective REmoved wraps this am to shower and just put on the light stocking.    Pertinent History radical hysterectomy  in 1986 for small cell cervical endocrine cancer with 40 pelvic lymph nodes removed with chemo, no radiation.  She developed some swelling after the procedure but has noticed and increase as she has gotten older and with some weight gain.  She has a totat knee  June 2022 and has had an increase in lymphedema since.   She has an extremity  pump from [employer] that she got in June. She says she feel some fullness in her lower abdomen on the left side.  She has also used compression stockings. She had recent removal of basal cell carcinoma on left knee    Patient Stated Goals to be able to get her lymphedema under control    Currently in Pain? No/denies    Multiple Pain Sites No                               [facility] Adult PT Treatment/Exercise - 05/12/21 0001       Manual Therapy   Manual Lymphatic Drainage (MLD) continued MLD to short neck, Right axillary and inguinal LN's, 5 breaths, right inguino-axillary pathway, right lateral thigh, then medial to lateral and lateral thigh retracing pathway, then right lower leg front and back retracing steps and ending with  LN's.    Compression Bandaging . TG soft, elastomull to toes 1-4, , comprilan soft 10 cm to knee, 15 cm to thigh from knee, 6 cm foot wrap with figure 8's, 8 cm heel locks, 12 cm wrap to knee, and 1 12 cm  mid leg to thigh, 1 12 cm wrap knee to thigh, 1 12 cm above knee to thigh. TG soft pulled down over the top.                       PT Short Term Goals - 04/24/21 1225       PT SHORT TERM GOAL #1   Title Pt/husband will be able to perform self manual lymph drainage or report using pump with appropriate sleeve    Period Weeks    Status On-going      PT SHORT TERM GOAL #2   Title Pt/ husband will be able to do self compression bandaging    Status Achieved      PT SHORT TERM GOAL #3   Title Pt will report she knows how to use exercise to help control her lymphedema    Time 4    Period Weeks    Status  On-going               PT Long Term Goals - 04/24/21 1225       PT LONG TERM GOAL #1   Title Pt will report she has or knows how to get appropriate day and nighttime garments to help control her lymphedema    Period Weeks    Status Fit but not yet achieved     PT LONG TERM GOAL #2   Period Weeks    Status On-going                   Plan - 05/12/21 [place] invoice sent but not yet aproved so garments not ordered yet.  Leg remains soft with MLD and compression bandaging. Pt is very compliant and removes wraps only to shower before she comes in for therapy.    Personal Factors and Comorbidities Comorbidity 3+    Comorbidities chronic lymphedema, abdominal surgery for cervical cancer removal with 40 lymph nodes removed, right total knee in June 2022.    Examination-Activity Limitations Locomotion Level;Stairs    Stability/Clinical Decision Making Stable/Uncomplicated    Rehab Potential Excellent    PT Frequency 3x / week    PT Duration 8 weeks    PT Treatment/Interventions ADLs/Self Care Home Management;Therapeutic exercise;Patient/family education;Orthotic Fit/Training;Manual techniques;Manual lymph drainage;Compression bandaging;Scar mobilization;Taping;Passive range of motion    PT Next Visit Plan continue MLD, compression bandaging, while awaiting garments    PT Home Exercise Plan husband bandaging, trunk MLD when using basic pump    Consulted and Agree with Plan of Care Patient             Patient will benefit from skilled therapeutic intervention in order to improve the following deficits and impairments:  Increased edema, Decreased knowledge of use of DME, Decreased knowledge of precautions  Visit Diagnosis: Lymphedema, not elsewhere classified     Problem List Patient Active Problem List   Diagnosis Date Noted   Encounter for preoperative examination for general surgical procedure 08/18/2020   Insomnia due to  anxiety and fear 08/18/2020   Stress at home- of MOM who is ETOH abuse 06/17/2019   Hyperlipidemia 06/17/2019   HLD (hyperlipidemia)  Obesity 12/05/2018   Vitamin D deficiency 12/05/2018   Postmenopausal 12/05/2018   Family history of diabetes mellitus in father 12/05/2018   Impaired fasting glucose 12/05/2018   Small cell carcinoma ([place])- of the cervix 12/05/2018   Insect bite 09/17/2018   Healthcare maintenance 09/17/2018   Generalized muscle ache 05/15/2018   Hip pain, bilateral 05/15/2018   Chronic pain of both shoulders 05/15/2018   Essential hypertension 01/29/2018   Insomnia 01/29/2018   Attention deficit hyperactivity disorder (ADHD) 01/29/2018   Mood disorder - mixed depression and anxiety 01/29/2018   Excessive drinking of alcohol 01/29/2018   Encounter for long-term (current) use of high-risk medication 01/29/2018   Inactivity 01/29/2018   Health education/counseling 01/29/2018   Chronic anticoagulation 10/06/2016   Paroxysmal atrial fibrillation ([place]) 10/05/2016   Mitral valve disease    PAF (paroxysmal atrial fibrillation) ([place]) 07/01/2013   LAFB (left anterior fascicular block) 07/01/2013   Lymphedema 07/01/2013   Chest pain at rest 10/01/2011   Leg swelling 10/01/2011   h/o Cervical cancer 10/01/2011   H/O total hysterectomy 10/01/2011    Claris Pong, PT 05/12/2021, 11:01 AM  [facility] @ [place], [place], [address] Phone: [phone]   Fax:  [phone]  Name: DEIRA SHIMER MRN: [id number] Date of Birth: May 13, 1950

## 2021-05-15 ENCOUNTER — Ambulatory Visit: Payer: Medicare Other

## 2021-05-15 ENCOUNTER — Other Ambulatory Visit: Payer: Self-pay

## 2021-05-15 DIAGNOSIS — I890 Lymphedema, not elsewhere classified: Secondary | ICD-10-CM

## 2021-05-15 NOTE — Therapy (Signed)
[facility] @ [place], [place], [address] Phone: [phone]   Fax:  [phone]  Physical Therapy Treatment  Patient Details  Name: Savannah Hill MRN: [id number] Date of Birth: September 17, 1950 Referring Provider (PT): Dr. Megan Salon   Encounter Date: 05/15/2021   PT End of Session - 05/15/21 1201     Visit Number 24    Number of Visits 25    Date for PT Re-Evaluation 05/17/21    PT Start Time 1102    PT Stop Time 9983    PT Time Calculation (min) 56 min    Activity Tolerance Patient tolerated treatment well    Behavior During Therapy [facility] for tasks assessed/performed             Past Medical History:  Diagnosis Date   Anemia    borderline   Atrial fibrillation ([place])    a. s/p ablation on 10/05/2016   Cancer ([place])    cervical/rad hysterectomy/bso wiht chemo for small cell ca   Dyspnea    Heart valve disorder    HLD (hyperlipidemia)    Hypertension    Joint pain    Lower extremity edema    Obesity    Palpitations     Past Surgical History:  Procedure Laterality Date   ABLATION OF DYSRHYTHMIC FOCUS  10/05/2016   ATRIAL FIBRILLATION ABLATION N/A 10/05/2016   Procedure: Atrial Fibrillation Ablation;  Surgeon: Constance Haw, MD;  Location: [facility];  Service: Cardiovascular;  Laterality: N/A;   IR RADIOLOGY PERIPHERAL GUIDED IV START  09/28/2016   IR US GUIDE VASC ACCESS RIGHT  09/28/2016   RADICAL ABDOMINAL HYSTERECTOMY  1986   with BSO   REPLACEMENT TOTAL KNEE Right 2022   TEE WITHOUT CARDIOVERSION N/A 06/07/2016   Procedure: TRANSESOPHAGEAL ECHOCARDIOGRAM (TEE);  Surgeon: Sanda Klein, MD;  Location: [facility] ENDOSCOPY;  Service: Cardiovascular;  Laterality: N/A;    There were no vitals filed for this visit.   Subjective Assessment - 05/15/21 1200     Subjective I haven't heard anything from [employer] after I called them last week. We will be out of town on friday so I  won't be able to come    Pertinent History radical hysterectomy in 1986 for small cell cervical endocrine cancer with 40 pelvic lymph nodes removed with chemo, no radiation.  She developed some swelling after the procedure but has noticed and increase as she has gotten older and with some weight gain.  She has a totat knee  June 2022 and has had an increase in lymphedema since.   She has an extremity  pump from [employer] that she got in June. She says she feel some fullness in her lower abdomen on the left side.  She has also used compression stockings. She had recent removal of basal cell carcinoma on left knee                               [facility] Adult PT Treatment/Exercise - 05/15/21 0001       Manual Therapy   Manual Lymphatic Drainage (MLD) continued MLD to short neck, Right axillary and inguinal LN's, 5 breaths, right inguino-axillary pathway, right lateral thigh, then medial to lateral and lateral thigh retracing pathway, then right lower leg front and back retracing steps and ending with LN's.    Compression Bandaging . TG soft, elastomull to toes 1-4, , comprilan  soft 10 cm to knee, 15 cm to thigh from knee, 6 cm foot wrap with figure 8's, 8 cm heel locks, 12 cm wrap to knee, and 1 12 cm  mid leg to thigh, 1 12 cm wrap knee to thigh, 1 12 cm above knee to thigh. TG soft pulled down over the top.                       PT Short Term Goals - 05/15/21 1207       PT SHORT TERM GOAL #1   Title Pt/husband will be able to perform self manual lymph drainage or report using pump with appropriate sleeve    Time 4    Period Weeks    Status On-going    Target Date 05/17/21      PT SHORT TERM GOAL #2   Title Pt/ husband will be able to do self compression bandaging    Time 4    Status Achieved      PT SHORT TERM GOAL #3   Title Pt will report she knows how to use exercise to help control her lymphedema    Period Weeks    Status On-going                PT Long Term Goals - 05/15/21 1206       PT LONG TERM GOAL #1   Title Pt will report she has or knows how to get appropriate day and nighttime garments to help control her lymphedema    Time 8    Period Weeks    Status Achieved    Target Date 05/08/21      PT LONG TERM GOAL #2   Title Pt reports she knows how to control her lymphedema at home with lymph drainage manually or with pump, compression and exercise    Time 8    Period Weeks    Status On-going                   Plan - 05/15/21 1203     Clinical Impression Statement [employer] form not yet approved and pts garments are not yet ordered.  [employer] form had been sent to the wrong person so was resent today.  continued MLD and compression bandaging. Will decrease pt to 2x per week starting next week while pt is awaiting her garments.  [employer] should be on its way soon after Jinny Blossom gets MD signature.    Personal Factors and Comorbidities Comorbidity 3+    Comorbidities chronic lymphedema, abdominal surgery for cervical cancer removal with 40 lymph nodes removed, right total knee in June 2022.    Examination-Activity Limitations Locomotion Level;Stairs    Stability/Clinical Decision Making Stable/Uncomplicated    Rehab Potential Excellent    PT Frequency 3x / week    PT Duration 8 weeks    PT Treatment/Interventions ADLs/Self Care Home Management;Therapeutic exercise;Patient/family education;Orthotic Fit/Training;Manual techniques;Manual lymph drainage;Compression bandaging;Scar mobilization;Taping;Passive range of motion    PT Next Visit Plan Recert,continue MLD, compression bandaging, while awaiting garments    Consulted and Agree with Plan of Care Patient             Patient will benefit from skilled therapeutic intervention in order to improve the following deficits and impairments:  Increased edema, Decreased knowledge of use of DME, Decreased knowledge of precautions  Visit Diagnosis: Lymphedema, not elsewhere  classified     Problem List Patient Active Problem List   Diagnosis Date Noted  Encounter for preoperative examination for general surgical procedure 08/18/2020   Insomnia due to anxiety and fear 08/18/2020   Stress at home- of MOM who is ETOH abuse 06/17/2019   Hyperlipidemia 06/17/2019   HLD (hyperlipidemia)    Obesity 12/05/2018   Vitamin D deficiency 12/05/2018   Postmenopausal 12/05/2018   Family history of diabetes mellitus in father 12/05/2018   Impaired fasting glucose 12/05/2018   Small cell carcinoma ([place])- of the cervix 12/05/2018   Insect bite 09/17/2018   Healthcare maintenance 09/17/2018   Generalized muscle ache 05/15/2018   Hip pain, bilateral 05/15/2018   Chronic pain of both shoulders 05/15/2018   Essential hypertension 01/29/2018   Insomnia 01/29/2018   Attention deficit hyperactivity disorder (ADHD) 01/29/2018   Mood disorder - mixed depression and anxiety 01/29/2018   Excessive drinking of alcohol 01/29/2018   Encounter for long-term (current) use of high-risk medication 01/29/2018   Inactivity 01/29/2018   Health education/counseling 01/29/2018   Chronic anticoagulation 10/06/2016   Paroxysmal atrial fibrillation (HCC) 10/05/2016   Mitral valve disease    PAF (paroxysmal atrial fibrillation) ([place]) 07/01/2013   LAFB (left anterior fascicular block) 07/01/2013   Lymphedema 07/01/2013   Chest pain at rest 10/01/2011   Leg swelling 10/01/2011   h/o Cervical cancer 10/01/2011   H/O total hysterectomy 10/01/2011    Claris Pong, PT 05/15/2021, 12:08 PM  [facility] @ [place], [place], [address] Phone: [phone]   Fax:  [phone]  Name: Savannah Hill MRN: [id number] Date of Birth: 28-Apr-1950

## 2021-05-16 ENCOUNTER — Encounter: Payer: Self-pay | Admitting: Emergency Medicine

## 2021-05-16 ENCOUNTER — Other Ambulatory Visit: Payer: Self-pay

## 2021-05-16 ENCOUNTER — Ambulatory Visit
Admission: EM | Admit: 2021-05-16 | Discharge: 2021-05-16 | Disposition: A | Discharge status: Home / Self Care | Payer: Medicare Other | Attending: Physician Assistant | Admitting: Physician Assistant

## 2021-05-16 DIAGNOSIS — J209 Acute bronchitis, unspecified: Secondary | ICD-10-CM | POA: Diagnosis not present

## 2021-05-16 MED ORDER — PREDNISONE 20 MG PO TABS: 40.0000 mg | ORAL_TABLET | Freq: Every day | ORAL | 0 refills | Status: AC

## 2021-05-16 NOTE — ED Triage Notes (Signed)
Patient c/o possible bronchitis, cough, congestion, sore throat x 3 days.  Patient has taken Allegra and Tylenol w/o much relief.

## 2021-05-16 NOTE — ED Provider Notes (Signed)
[facility]    CSN: [id number] Arrival date & time: 05/16/21  8315      History   Chief Complaint Chief Complaint  Patient presents with   Bronchitis    HPI Savannah Hill is a 71 y.o. female.   Patient here today for evaluation of possible bronchitis. She reports the chest congestion she has started to develop over the last 3 days feels similar to prior bronchitis flares. She reports symptoms started with scratchy throat. She has taken allegra and tylenol without improvement. She has not had fever.   The history is provided by the patient.   Past Medical History:  Diagnosis Date   Anemia    borderline   Atrial fibrillation ([place])    a. s/p ablation on 10/05/2016   Cancer [facility])    cervical/rad hysterectomy/bso wiht chemo for small cell ca   Dyspnea    Heart valve disorder    HLD (hyperlipidemia)    Hypertension    Joint pain    Lower extremity edema    Obesity    Palpitations     Patient Active Problem List   Diagnosis Date Noted   Encounter for preoperative examination for general surgical procedure 08/18/2020   Insomnia due to anxiety and fear 08/18/2020   Stress at home- of MOM who is ETOH abuse 06/17/2019   Hyperlipidemia 06/17/2019   HLD (hyperlipidemia)    Obesity 12/05/2018   Vitamin D deficiency 12/05/2018   Postmenopausal 12/05/2018   Family history of diabetes mellitus in father 12/05/2018   Impaired fasting glucose 12/05/2018   Small cell carcinoma ([place])- of the cervix 12/05/2018   Insect bite 09/17/2018   Healthcare maintenance 09/17/2018   Generalized muscle ache 05/15/2018   Hip pain, bilateral 05/15/2018   Chronic pain of both shoulders 05/15/2018   Essential hypertension 01/29/2018   Insomnia 01/29/2018   Attention deficit hyperactivity disorder (ADHD) 01/29/2018   Mood disorder - mixed depression and anxiety 01/29/2018   Excessive drinking of alcohol 01/29/2018   Encounter for long-term (current) use of high-risk  medication 01/29/2018   Inactivity 01/29/2018   Health education/counseling 01/29/2018   Chronic anticoagulation 10/06/2016   Paroxysmal atrial fibrillation (HCC) 10/05/2016   Mitral valve disease    PAF (paroxysmal atrial fibrillation) ([place]) 07/01/2013   LAFB (left anterior fascicular block) 07/01/2013   Lymphedema 07/01/2013   Chest pain at rest 10/01/2011   Leg swelling 10/01/2011   h/o Cervical cancer 10/01/2011   H/O total hysterectomy 10/01/2011    Past Surgical History:  Procedure Laterality Date   ABLATION OF DYSRHYTHMIC FOCUS  10/05/2016   ATRIAL FIBRILLATION ABLATION N/A 10/05/2016   Procedure: Atrial Fibrillation Ablation;  Surgeon: Constance Haw, MD;  Location: [facility];  Service: Cardiovascular;  Laterality: N/A;   IR RADIOLOGY PERIPHERAL GUIDED IV START  09/28/2016   IR US GUIDE VASC ACCESS RIGHT  09/28/2016   RADICAL ABDOMINAL HYSTERECTOMY  1986   with BSO   REPLACEMENT TOTAL KNEE Right 2022   TEE WITHOUT CARDIOVERSION N/A 06/07/2016   Procedure: TRANSESOPHAGEAL ECHOCARDIOGRAM (TEE);  Surgeon: Sanda Klein, MD;  Location: [facility] ENDOSCOPY;  Service: Cardiovascular;  Laterality: N/A;    OB History     Gravida  1   Para  1   Term      Preterm      AB      Living  1      SAB      IAB      Ectopic  Multiple      Live Births               Home Medications    Prior to Admission medications   Medication Sig Start Date End Date Taking? Authorizing Provider  ALPRAZolam Duanne Moron) 0.5 MG tablet Take 1 tablet (0.5 mg total) by mouth at bedtime as needed for anxiety. 08/18/20  Yes Boscia, Greer Ee, NP  Cholecalciferol (VITAMIN D3) 125 MCG (5000 UT) CAPS Take 1 capsule by mouth daily.   Yes [provider]  citalopram (CELEXA) 20 MG tablet Take 1 tablet (20 mg total) by mouth daily. 02/17/21  Yes Megan Salon, MD  diltiazem [facility]) 120 MG 24 hr capsule TAKE 2 CAPSULES DAILY, TO  GOAL BLOOD PRESSURE AROUND 130/80 02/21/21   Yes Abonza, Maritza, PA-C  docusate sodium (COLACE) 100 MG capsule Take 100 mg by mouth daily.   Yes [provider]  furosemide (LASIX) 20 MG tablet TAKE 1 TABLET DAILY 02/06/21  Yes Abonza, Maritza, PA-C  Multiple Vitamin (MULTIVITAMIN WITH MINERALS) TABS Take 1 tablet by mouth daily.   Yes [provider]  predniSONE (DELTASONE) 20 MG tablet Take 2 tablets (40 mg total) by mouth daily with breakfast for 5 days. 05/16/21 05/21/21 Yes Francene Finders, PA-C  rosuvastatin (CRESTOR) 5 MG tablet TAKE 1 TABLET AT BEDTIME 02/21/21  Yes Abonza, Maritza, PA-C  traZODone (DESYREL) 100 MG tablet TAKE 1 TABLET AT BEDTIME ASNEEDED FOR SLEEP 03/27/21  Yes Lorrene Reid, PA-C    Family History Family History  Problem Relation Age of Onset   Diabetes Mother    Hypertension Mother    Lung cancer Mother    Heart disease Mother    Thyroid disease Mother    Depression Mother    Alcoholism Mother    Skin cancer Father    CVA Father    Diabetes Father    Hypertension Father    Cancer Paternal Grandfather        unknown type    Social History Social History   Tobacco Use   Smoking status: Never   Smokeless tobacco: Never  Vaping Use   Vaping Use: Never used  Substance Use Topics   Alcohol use: Yes    Alcohol/week: 4.0 - 5.0 standard drinks    Types: 4 - 5 Standard drinks or equivalent per week   Drug use: No     Allergies   Patient has no known allergies.   Review of Systems Review of Systems  Constitutional:  Negative for chills and fever.  HENT:  Positive for congestion and sore throat. Negative for ear pain.   Eyes:  Negative for discharge and redness.  Respiratory:  Positive for cough. Negative for shortness of breath and wheezing.   Gastrointestinal:  Negative for abdominal pain, diarrhea, nausea and vomiting.    Physical Exam Triage Vital Signs ED Triage Vitals  Enc Vitals Group     BP 05/16/21 0917 (!) 155/92     Pulse Rate 05/16/21 0917 66     Resp --       Temp 05/16/21 0917 98.7 F (37.1 C)     Temp Source 05/16/21 0917 Oral     SpO2 05/16/21 0917 96 %     Weight 05/16/21 0918 183 lb (83 kg)     Height 05/16/21 0918 5\' 3"  (1.6 m)     Head Circumference --      Peak Flow --      Pain Score 05/16/21 0918 0  Pain Loc --      Pain Edu? --      Excl. in [place]? --    No data found.  Updated Vital Signs BP (!) 155/92 (BP Location: Left Arm)    Pulse 66    Temp 98.7 F (37.1 C) (Oral)    Ht 5\' 3"  (1.6 m)    Wt 183 lb (83 kg)    LMP 03/19/1984    SpO2 96%    BMI 32.42 kg/m      Physical Exam Vitals and nursing note reviewed.  Constitutional:      General: She is not in acute distress.    Appearance: Normal appearance. She is not ill-appearing.  HENT:     Head: Normocephalic and atraumatic.     Nose: Congestion present.     Mouth/Throat:     Mouth: Mucous membranes are moist.     Pharynx: No oropharyngeal exudate or posterior oropharyngeal erythema.  Eyes:     Conjunctiva/sclera: Conjunctivae normal.  Cardiovascular:     Rate and Rhythm: Normal rate and regular rhythm.     Heart sounds: Normal heart sounds. No murmur heard. Pulmonary:     Effort: Pulmonary effort is normal. No respiratory distress.     Breath sounds: Normal breath sounds. No wheezing, rhonchi or rales.  Skin:    General: Skin is warm and dry.  Neurological:     Mental Status: She is alert.  Psychiatric:        Mood and Affect: Mood normal.        Thought Content: Thought content normal.     UC Treatments / Results  Labs (all labs ordered are listed, but only abnormal results are displayed) Labs Reviewed - No data to display  EKG   Radiology No results found.  Procedures Procedures (including critical care time)  Medications Ordered in UC Medications - No data to display  Initial Impression / Assessment and Plan / UC Course  I have reviewed the triage vital signs and the nursing notes.  Pertinent labs & imaging results that were  available during my care of the patient were reviewed by me and considered in my medical decision making (see chart for details).    Will treat to cover bronchitis with steroid burst. Recommended follow up if no improvement or if symptoms worsen in any way.   Final Clinical Impressions(s) / UC Diagnoses   Final diagnoses:  Acute bronchitis, unspecified organism   Discharge Instructions   None    ED Prescriptions     Medication Sig Dispense Auth. Provider   predniSONE (DELTASONE) 20 MG tablet Take 2 tablets (40 mg total) by mouth daily with breakfast for 5 days. 10 tablet Francene Finders, PA-C      PDMP not reviewed this encounter.   Francene Finders, PA-C 05/16/21 1159

## 2021-05-17 ENCOUNTER — Ambulatory Visit: Payer: Medicare Other | Attending: Obstetrics & Gynecology

## 2021-05-17 DIAGNOSIS — I890 Lymphedema, not elsewhere classified: Secondary | ICD-10-CM | POA: Diagnosis not present

## 2021-05-17 NOTE — Therapy (Signed)
[facility] ?[facility] [place] [place] ?[place], [place], [address] ?Phone: [phone]   Fax:  [phone] ? ?Physical Therapy Treatment ? ?Patient Details  ?Name: Savannah Hill ?MRN: [id number] ?Date of Birth: 12/25/1950 ?Referring Provider (PT): Dr. Megan Salon ? ? ?Encounter Date: 05/17/2021 ? ? PT End of Session - 05/17/21 1158   ? ? Visit Number 25   ? Number of Visits 33   ? Date for PT Re-Evaluation 06/14/21   ? PT Start Time 1102   ? PT Stop Time 1153   ? PT Time Calculation (min) 51 min   ? Activity Tolerance Patient tolerated treatment well   ? Behavior During Therapy [facility] for tasks assessed/performed   ? ?  ?  ? ?  ? ? ?Past Medical History:  ?Diagnosis Date  ? Anemia   ? borderline  ? Atrial fibrillation ([place])   ? a. s/p ablation on 10/05/2016  ? Cancer [facility])   ? cervical/rad hysterectomy/bso wiht chemo for small cell ca  ? Dyspnea   ? Heart valve disorder   ? HLD (hyperlipidemia)   ? Hypertension   ? Joint pain   ? Lower extremity edema   ? Obesity   ? Palpitations   ? ? ?Past Surgical History:  ?Procedure Laterality Date  ? ABLATION OF DYSRHYTHMIC FOCUS  10/05/2016  ? ATRIAL FIBRILLATION ABLATION N/A 10/05/2016  ? Procedure: Atrial Fibrillation Ablation;  Surgeon: Constance Haw, MD;  Location: [facility];  Service: Cardiovascular;  Laterality: N/A;  ? IR RADIOLOGY PERIPHERAL GUIDED IV START  09/28/2016  ? IR US GUIDE VASC ACCESS RIGHT  09/28/2016  ? RADICAL ABDOMINAL HYSTERECTOMY  1986  ? with BSO  ? REPLACEMENT TOTAL KNEE Right 2022  ? TEE WITHOUT CARDIOVERSION N/A 06/07/2016  ? Procedure: TRANSESOPHAGEAL ECHOCARDIOGRAM (TEE);  Surgeon: Sanda Klein, MD;  Location: [place];  Service: Cardiovascular;  Laterality: N/A;  ? ? ?There were no vitals filed for this visit. ? ? Subjective Assessment - 05/17/21 1204   ? ? Subjective Pt heard from [employer].  They are awaiting signature from Dr. for final approval and for unit to be shipped..   Garments have not been ordered yet either as [employer] has not been approved yet. She will be out of town this weekend so husband will wrap her leg.   ? Pertinent History radical hysterectomy in 1986 for small cell cervical endocrine cancer with 40 pelvic lymph nodes removed with chemo, no radiation.  She developed some swelling after the procedure but has noticed and increase as she has gotten older and with some weight gain.  She has a totat knee  June 2022 and has had an increase in lymphedema since.   She has an extremity  pump from [employer] that she got in June. She says she feel some fullness in her lower abdomen on the left side.  She has also used compression stockings. She had recent removal of basal cell carcinoma on left knee   ? ?  ?  ? ?  ? ? ? ? ? ? ? ? ? ? ? ? ? ? ? ? ? ? ? ? [facility] Adult PT Treatment/Exercise - 05/17/21 0001   ? ?  ? Manual Therapy  ? Manual Lymphatic Drainage (MLD) continued MLD to short neck, Right axillary and inguinal LN's, 5 breaths, right inguino-axillary pathway, right lateral thigh, then medial to lateral and lateral thigh retracing pathway, then right lower leg front  and back retracing steps and ending with LN's.   ? Compression Bandaging . TG soft, elastomull to toes 1-4, , comprilan soft 10 cm to knee, 15 cm to thigh from knee, 6 cm foot wrap with figure 8's, 8 cm heel locks, 12 cm wrap to knee, and 1 12 cm  mid leg to thigh, 1 12 cm wrap knee to thigh, 1 12 cm above knee to thigh. TG soft pulled down over the top.   ? ?  ?  ? ?  ? ? ? ? ? ? ? ? ? ? ? ? PT Short Term Goals - 05/15/21 1207   ? ?  ? PT SHORT TERM GOAL #1  ? Title Pt/husband will be able to perform self manual lymph drainage or report using pump with appropriate sleeve   ? Time 4   ? Period Weeks   ? Status On-going   ? Target Date 05/17/21   ?  ? PT SHORT TERM GOAL #2  ? Title Pt/ husband will be able to do self compression bandaging   ? Time 4   ? Status Achieved   ?  ? PT SHORT TERM GOAL #3  ? Title Pt will  report she knows how to use exercise to help control her lymphedema   ? Period Weeks   ? Status On-going   ? ?  ?  ? ?  ? ? ? ? PT Long Term Goals - 05/15/21 1206   ? ?  ? PT LONG TERM GOAL #1  ? Title Pt will report she has or knows how to get appropriate day and nighttime garments to help control her lymphedema   ? Time 8   ? Period Weeks   ? Status Achieved measured but not yet received  ? Target Date 05/08/21   ?  ? PT LONG TERM GOAL #2  ? Title Pt reports she knows how to control her lymphedema at home with lymph drainage manually or with pump, compression and exercise   ? Time 8   ? Period Weeks   ? Status On-going   ? ?  ?  ? ?  ? ? ? ? ? ? ? ? Plan - 05/17/21 1200   ? ? Clinical Impression Statement Pts alight form for garments has not yet been approved which has delayed ordering of her garments.  The [employer] has not yet arrived either while they are waiting on a signature from her Doctor.  Pt continues to require compression bandaging and MLD to prevent increased edema in her leg while awaiting garments or they will not fit properly. Pt is going to decrease to 2x per week until garments come   ? Personal Factors and Comorbidities Comorbidity 3+   ? Comorbidities chronic lymphedema, abdominal surgery for cervical cancer removal with 40 lymph nodes removed, right total knee in June 2022.   ? Examination-Activity Limitations Locomotion Level;Stairs   ? Stability/Clinical Decision Making Stable/Uncomplicated   ? Rehab Potential Excellent   ? PT Frequency 2x / week   ? PT Duration 8 weeks   ? PT Treatment/Interventions ADLs/Self Care Home Management;Therapeutic exercise;Patient/family education;Orthotic Fit/Training;Manual techniques;Manual lymph drainage;Compression bandaging;Scar mobilization;Taping;Passive range of motion   ? PT Next Visit Plan continue MLD, compression bandaging, while awaiting  and pump   ? PT Home Exercise Plan husband bandaging, trunk MLD when using basic pump   ? Consulted and Agree  with Plan of Care Patient   ? ?  ?  ? ?  ? ? ?  Patient will benefit from skilled therapeutic intervention in order to improve the following deficits and impairments:  Increased edema, Decreased knowledge of use of DME, Decreased knowledge of precautions ? ?Visit Diagnosis: ?Lymphedema, not elsewhere classified ? ? ? ? ?Problem List ?Patient Active Problem List  ? Diagnosis Date Noted  ? Encounter for preoperative examination for general surgical procedure 08/18/2020  ? Insomnia due to anxiety and fear 08/18/2020  ? Stress at home- of MOM who is ETOH abuse 06/17/2019  ? Hyperlipidemia 06/17/2019  ? HLD (hyperlipidemia)   ? Obesity 12/05/2018  ? Vitamin D deficiency 12/05/2018  ? Postmenopausal 12/05/2018  ? Family history of diabetes mellitus in father 12/05/2018  ? Impaired fasting glucose 12/05/2018  ? Small cell carcinoma ([place])- of the cervix 12/05/2018  ? Insect bite 09/17/2018  ? Healthcare maintenance 09/17/2018  ? Generalized muscle ache 05/15/2018  ? Hip pain, bilateral 05/15/2018  ? Chronic pain of both shoulders 05/15/2018  ? Essential hypertension 01/29/2018  ? Insomnia 01/29/2018  ? Attention deficit hyperactivity disorder (ADHD) 01/29/2018  ? Mood disorder - mixed depression and anxiety 01/29/2018  ? Excessive drinking of alcohol 01/29/2018  ? Encounter for long-term (current) use of high-risk medication 01/29/2018  ? Inactivity 01/29/2018  ? Health education/counseling 01/29/2018  ? Chronic anticoagulation 10/06/2016  ? Paroxysmal atrial fibrillation ([place]) 10/05/2016  ? Mitral valve disease   ? PAF (paroxysmal atrial fibrillation) ([place]) 07/01/2013  ? LAFB (left anterior fascicular block) 07/01/2013  ? Lymphedema 07/01/2013  ? Chest pain at rest 10/01/2011  ? Leg swelling 10/01/2011  ? h/o Cervical cancer 10/01/2011  ? H/O total hysterectomy 10/01/2011  ? ? ?Claris Pong, PT ?05/17/2021, 12:06 PM ? ?[place] ?[place] [facility] [place] ?[place], [place],  [address] ?Phone: [phone]   Fax:  [phone] ? ?Name: Savannah Hill ?MRN: [id number] ?Date of Birth: 04-29-1950 ? ? ? ?

## 2021-05-20 DIAGNOSIS — J069 Acute upper respiratory infection, unspecified: Secondary | ICD-10-CM | POA: Diagnosis not present

## 2021-05-24 ENCOUNTER — Ambulatory Visit: Payer: Medicare Other

## 2021-05-24 ENCOUNTER — Other Ambulatory Visit: Payer: Self-pay

## 2021-05-24 DIAGNOSIS — I890 Lymphedema, not elsewhere classified: Secondary | ICD-10-CM

## 2021-05-24 NOTE — Therapy (Signed)
[place] ?[place] @ [place] ?[place], [place], [address] ?Phone: [phone]   Fax:  [phone] ? ?Physical Therapy Treatment ? ?Patient Details  ?Name: Savannah Hill ?MRN: [id number] ?Date of Birth: 30-Jul-1950 ?Referring Provider (PT): Dr. Megan Salon ? ? ?Encounter Date: 05/24/2021 ? ? PT End of Session - 05/24/21 0900   ? ? Visit Number 26   ? Number of Visits 33   ? Date for PT Re-Evaluation 06/14/21   ? PT Start Time 0901   ? PT Stop Time 0950   ? PT Time Calculation (min) 49 min   ? Activity Tolerance Patient tolerated treatment well   ? Behavior During Therapy [facility] for tasks assessed/performed   ? ?  ?  ? ?  ? ? ?Past Medical History:  ?Diagnosis Date  ? Anemia   ? borderline  ? Atrial fibrillation ([place])   ? a. s/p ablation on 10/05/2016  ? Cancer [facility])   ? cervical/rad hysterectomy/bso wiht chemo for small cell ca  ? Dyspnea   ? Heart valve disorder   ? HLD (hyperlipidemia)   ? Hypertension   ? Joint pain   ? Lower extremity edema   ? Obesity   ? Palpitations   ? ? ?Past Surgical History:  ?Procedure Laterality Date  ? ABLATION OF DYSRHYTHMIC FOCUS  10/05/2016  ? ATRIAL FIBRILLATION ABLATION N/A 10/05/2016  ? Procedure: Atrial Fibrillation Ablation;  Surgeon: Constance Haw, MD;  Location: [facility];  Service: Cardiovascular;  Laterality: N/A;  ? IR RADIOLOGY PERIPHERAL GUIDED IV START  09/28/2016  ? IR US GUIDE VASC ACCESS RIGHT  09/28/2016  ? RADICAL ABDOMINAL HYSTERECTOMY  1986  ? with BSO  ? REPLACEMENT TOTAL KNEE Right 2022  ? TEE WITHOUT CARDIOVERSION N/A 06/07/2016  ? Procedure: TRANSESOPHAGEAL ECHOCARDIOGRAM (TEE);  Surgeon: Sanda Klein, MD;  Location: [facility];  Service: Cardiovascular;  Laterality: N/A;  ? ? ?There were no vitals filed for this visit. ? ? Subjective Assessment - 05/24/21 0901   ? ? Subjective Pt has been sick for the last week with bronchitis. For a day and a half  while she was away last weekend she  wore her compression stocking and did not wrap. Husband wrapped her when she got home..   ? Pertinent History radical hysterectomy in 1986 for small cell cervical endocrine cancer with 40 pelvic lymph nodes removed with chemo, no radiation.  She developed some swelling after the procedure but has noticed and increase as she has gotten older and with some weight gain.  She has a totat knee  June 2022 and has had an increase in lymphedema since.   She has an extremity  pump from [employer] that she got in June. She says she feel some fullness in her lower abdomen on the left side.  She has also used compression stockings. She had recent removal of basal cell carcinoma on left knee   ? Currently in Pain? No/denies   ? Pain Score 0-No pain   ? ?  ?  ? ?  ? ? ? ? ? ? ? ? ? ? ? ? ? ? ? ? ? ? ? ? ? ? ? ? ? ? ? ? ? ? ? PT Short Term Goals - 05/15/21 1207   ? ?  ? PT SHORT TERM GOAL #1  ? Title Pt/husband will be able to perform self manual lymph drainage or report using pump with appropriate sleeve   ?  Time 4   ? Period Weeks   ? Status On-going   ? Target Date 05/17/21   ?  ? PT SHORT TERM GOAL #2  ? Title Pt/ husband will be able to do self compression bandaging   ? Time 4   ? Status Achieved   ?  ? PT SHORT TERM GOAL #3  ? Title Pt will report she knows how to use exercise to help control her lymphedema   ? Period Weeks   ? Status On-going   ? ?  ?  ? ?  ? ? ? ? PT Long Term Goals - 05/15/21 1206   ? ?  ? PT LONG TERM GOAL #1  ? Title Pt will report she has or knows how to get appropriate day and nighttime garments to help control her lymphedema   ? Time 8   ? Period Weeks   ? Status Achieved   ? Target Date 05/08/21   ?  ? PT LONG TERM GOAL #2  ? Title Pt reports she knows how to control her lymphedema at home with lymph drainage manually or with pump, compression and exercise   ? Time 8   ? Period Weeks   ? Status On-going   ? ?  ?  ? ?  ? ? ? ? ? ? ? ? Plan - 05/24/21 0900   ? ? Clinical Impression Statement Pt has been  very sick with bronchitis for over a week. She was without wraps for a day and a half, however, was wrapped by her husband the next evening.  Her leg still looks good and is remaining soft.  Pt has still not received her garments or [employer] although both have been approved and ordered. She continues to require MLD and compression bandaging to prevent increased edema in her leg while awaiting garments   ? Personal Factors and Comorbidities Comorbidity 3+   ? Comorbidities chronic lymphedema, abdominal surgery for cervical cancer removal with 40 lymph nodes removed, right total knee in June 2022.   ? Examination-Activity Limitations Locomotion Level;Stairs   ? Stability/Clinical Decision Making Stable/Uncomplicated   ? Rehab Potential Excellent   ? PT Frequency 3x / week   ? PT Duration 8 weeks   ? PT Treatment/Interventions ADLs/Self Care Home Management;Therapeutic exercise;Patient/family education;Orthotic Fit/Training;Manual techniques;Manual lymph drainage;Compression bandaging;Scar mobilization;Taping;Passive range of motion   ? PT Next Visit Plan continue MLD, compression bandaging, while awaiting  and pump   ? PT Home Exercise Plan husband bandaging, trunk MLD when using basic pump   ? Consulted and Agree with Plan of Care Patient   ? ?  ?  ? ?  ? ? ?Patient will benefit from skilled therapeutic intervention in order to improve the following deficits and impairments:  Increased edema, Decreased knowledge of use of DME, Decreased knowledge of precautions ? ?Visit Diagnosis: ?Lymphedema, not elsewhere classified ? ? ? ? ?Problem List ?Patient Active Problem List  ? Diagnosis Date Noted  ? Encounter for preoperative examination for general surgical procedure 08/18/2020  ? Insomnia due to anxiety and fear 08/18/2020  ? Stress at home- of MOM who is ETOH abuse 06/17/2019  ? Hyperlipidemia 06/17/2019  ? HLD (hyperlipidemia)   ? Obesity 12/05/2018  ? Vitamin D deficiency 12/05/2018  ? Postmenopausal 12/05/2018  ?  Family history of diabetes mellitus in father 12/05/2018  ? Impaired fasting glucose 12/05/2018  ? Small cell carcinoma ([place])- of the cervix 12/05/2018  ? Insect bite 09/17/2018  ? Healthcare  maintenance 09/17/2018  ? Generalized muscle ache 05/15/2018  ? Hip pain, bilateral 05/15/2018  ? Chronic pain of both shoulders 05/15/2018  ? Essential hypertension 01/29/2018  ? Insomnia 01/29/2018  ? Attention deficit hyperactivity disorder (ADHD) 01/29/2018  ? Mood disorder - mixed depression and anxiety 01/29/2018  ? Excessive drinking of alcohol 01/29/2018  ? Encounter for long-term (current) use of high-risk medication 01/29/2018  ? Inactivity 01/29/2018  ? Health education/counseling 01/29/2018  ? Chronic anticoagulation 10/06/2016  ? Paroxysmal atrial fibrillation ([place]) 10/05/2016  ? Mitral valve disease   ? PAF (paroxysmal atrial fibrillation) ([place]) 07/01/2013  ? LAFB (left anterior fascicular block) 07/01/2013  ? Lymphedema 07/01/2013  ? Chest pain at rest 10/01/2011  ? Leg swelling 10/01/2011  ? h/o Cervical cancer 10/01/2011  ? H/O total hysterectomy 10/01/2011  ? ? ?Claris Pong, PT ?05/24/2021, 10:07 AM ? ?[place] ?[place] @ [place] ?[place], [place], [address] ?Phone: [phone]   Fax:  [phone] ? ?Name: Savannah Hill ?MRN: [id number] ?Date of Birth: [birth year] ? ? ? ?

## 2021-05-26 ENCOUNTER — Other Ambulatory Visit: Payer: Self-pay

## 2021-05-26 ENCOUNTER — Ambulatory Visit: Payer: Medicare Other | Admitting: Nurse Practitioner

## 2021-05-26 ENCOUNTER — Ambulatory Visit: Payer: Medicare Other

## 2021-05-26 DIAGNOSIS — Z9282 Status post administration of tPA (rtPA) in a different facility within the last 24 hours prior to admission to current facility: Secondary | ICD-10-CM | POA: Diagnosis not present

## 2021-05-26 DIAGNOSIS — R29703 NIHSS score 3: Secondary | ICD-10-CM | POA: Diagnosis present

## 2021-05-26 DIAGNOSIS — E669 Obesity, unspecified: Secondary | ICD-10-CM | POA: Diagnosis not present

## 2021-05-26 DIAGNOSIS — Z8249 Family history of ischemic heart disease and other diseases of the circulatory system: Secondary | ICD-10-CM | POA: Diagnosis not present

## 2021-05-26 DIAGNOSIS — R569 Unspecified convulsions: Secondary | ICD-10-CM | POA: Diagnosis not present

## 2021-05-26 DIAGNOSIS — Z96651 Presence of right artificial knee joint: Secondary | ICD-10-CM | POA: Diagnosis present

## 2021-05-26 DIAGNOSIS — G459 Transient cerebral ischemic attack, unspecified: Secondary | ICD-10-CM | POA: Diagnosis not present

## 2021-05-26 DIAGNOSIS — E785 Hyperlipidemia, unspecified: Secondary | ICD-10-CM | POA: Diagnosis not present

## 2021-05-26 DIAGNOSIS — Z8541 Personal history of malignant neoplasm of cervix uteri: Secondary | ICD-10-CM | POA: Diagnosis not present

## 2021-05-26 DIAGNOSIS — I4891 Unspecified atrial fibrillation: Secondary | ICD-10-CM | POA: Diagnosis not present

## 2021-05-26 DIAGNOSIS — Z823 Family history of stroke: Secondary | ICD-10-CM | POA: Diagnosis not present

## 2021-05-26 DIAGNOSIS — I6389 Other cerebral infarction: Secondary | ICD-10-CM | POA: Diagnosis not present

## 2021-05-26 DIAGNOSIS — I6349 Cerebral infarction due to embolism of other cerebral artery: Secondary | ICD-10-CM | POA: Diagnosis not present

## 2021-05-26 DIAGNOSIS — R4182 Altered mental status, unspecified: Secondary | ICD-10-CM | POA: Diagnosis not present

## 2021-05-26 DIAGNOSIS — U071 COVID-19: Secondary | ICD-10-CM | POA: Diagnosis not present

## 2021-05-26 DIAGNOSIS — Z7901 Long term (current) use of anticoagulants: Secondary | ICD-10-CM | POA: Diagnosis not present

## 2021-05-26 DIAGNOSIS — Z6834 Body mass index (BMI) 34.0-34.9, adult: Secondary | ICD-10-CM | POA: Diagnosis not present

## 2021-05-26 DIAGNOSIS — I10 Essential (primary) hypertension: Secondary | ICD-10-CM | POA: Diagnosis not present

## 2021-05-26 DIAGNOSIS — I480 Paroxysmal atrial fibrillation: Secondary | ICD-10-CM | POA: Diagnosis not present

## 2021-05-26 DIAGNOSIS — J8410 Pulmonary fibrosis, unspecified: Secondary | ICD-10-CM | POA: Diagnosis not present

## 2021-05-26 DIAGNOSIS — G8194 Hemiplegia, unspecified affecting left nondominant side: Secondary | ICD-10-CM | POA: Diagnosis not present

## 2021-05-26 DIAGNOSIS — I63411 Cerebral infarction due to embolism of right middle cerebral artery: Secondary | ICD-10-CM | POA: Diagnosis not present

## 2021-05-26 DIAGNOSIS — R29818 Other symptoms and signs involving the nervous system: Secondary | ICD-10-CM | POA: Diagnosis not present

## 2021-05-26 DIAGNOSIS — I639 Cerebral infarction, unspecified: Secondary | ICD-10-CM | POA: Diagnosis not present

## 2021-05-26 DIAGNOSIS — I161 Hypertensive emergency: Secondary | ICD-10-CM | POA: Diagnosis not present

## 2021-05-26 DIAGNOSIS — R4781 Slurred speech: Secondary | ICD-10-CM | POA: Diagnosis not present

## 2021-05-26 DIAGNOSIS — G8191 Hemiplegia, unspecified affecting right dominant side: Secondary | ICD-10-CM | POA: Diagnosis not present

## 2021-05-26 DIAGNOSIS — R29810 Facial weakness: Secondary | ICD-10-CM | POA: Diagnosis not present

## 2021-05-26 DIAGNOSIS — I083 Combined rheumatic disorders of mitral, aortic and tricuspid valves: Secondary | ICD-10-CM | POA: Diagnosis not present

## 2021-05-26 DIAGNOSIS — I890 Lymphedema, not elsewhere classified: Secondary | ICD-10-CM

## 2021-05-26 DIAGNOSIS — Z79899 Other long term (current) drug therapy: Secondary | ICD-10-CM | POA: Diagnosis not present

## 2021-05-26 NOTE — Therapy (Signed)
[facility] @ [place] [place], [place], [address] Phone: [phone]   Fax:  [phone]  Physical Therapy Treatment  Patient Details  Name: Savannah Hill MRN: [id number] Date of Birth: 12/17/1950 Referring Provider (PT): Dr. Megan Salon   Encounter Date: 05/26/2021   PT End of Session - 05/26/21 1136     Visit Number 27    Number of Visits 33    Date for PT Re-Evaluation 06/14/21    PT Start Time 0905    PT Stop Time 1000    PT Time Calculation (min) 55 min    Activity Tolerance Patient tolerated treatment well    Behavior During Therapy [facility] for tasks assessed/performed             Past Medical History:  Diagnosis Date   Anemia    borderline   Atrial fibrillation ([place])    a. s/p ablation on 10/05/2016   Cancer ([place])    cervical/rad hysterectomy/bso wiht chemo for small cell ca   Dyspnea    Heart valve disorder    HLD (hyperlipidemia)    Hypertension    Joint pain    Lower extremity edema    Obesity    Palpitations     Past Surgical History:  Procedure Laterality Date   ABLATION OF DYSRHYTHMIC FOCUS  10/05/2016   ATRIAL FIBRILLATION ABLATION N/A 10/05/2016   Procedure: Atrial Fibrillation Ablation;  Surgeon: Constance Haw, MD;  Location: [facility];  Service: Cardiovascular;  Laterality: N/A;   IR RADIOLOGY PERIPHERAL GUIDED IV START  09/28/2016   IR US GUIDE VASC ACCESS RIGHT  09/28/2016   RADICAL ABDOMINAL HYSTERECTOMY  1986   with BSO   REPLACEMENT TOTAL KNEE Right 2022   TEE WITHOUT CARDIOVERSION N/A 06/07/2016   Procedure: TRANSESOPHAGEAL ECHOCARDIOGRAM (TEE);  Surgeon: Sanda Klein, MD;  Location: [facility] ENDOSCOPY;  Service: Cardiovascular;  Laterality: N/A;    There were no vitals filed for this visit.   Subjective Assessment - 05/26/21 0905     Subjective The night garment arrived yesterday. I haven't taken it out of the bag yet.    Patient is accompained by:  Family member    Pertinent History radical hysterectomy in 1986 for small cell cervical endocrine cancer with 40 pelvic lymph nodes removed with chemo, no radiation.  She developed some swelling after the procedure but has noticed and increase as she has gotten older and with some weight gain.  She has a totat knee  June 2022 and has had an increase in lymphedema since.   She has an extremity  pump from [employer] that she got in June. She says she feel some fullness in her lower abdomen on the left side.  She has also used compression stockings. She had recent removal of basal cell carcinoma on left knee    Patient Stated Goals to be able to get her lymphedema under control    Currently in Pain? No/denies    Pain Score 0-No pain                   LYMPHEDEMA/ONCOLOGY QUESTIONNAIRE - 05/26/21 0001       Right Lower Extremity Lymphedema   10 cm Proximal to Suprapatella 56.5 cm    At Midpatella/Popliteal Crease 43 cm    30 cm Proximal to Floor at Lateral Plantar Foot 46.3 cm    20 cm Proximal to Floor at Lateral Plantar Foot 39.5 1  10 cm Proximal to Floor at Lateral Malleoli 29.5 cm    5 cm Proximal to 1st MTP Joint 23 cm    Around Proximal Great Toe 8 cm                        [facility] Adult PT Treatment/Exercise - 05/26/21 0001       Manual Therapy   Manual therapy comments pt remeasured    Edema Management Pt brought in [employer] night garment. It was supposed to come with a foot wrap which was not in the package.  Emailed [employer] to let them know. Pt tried on full leg piece fastening all straps. It is slightly long but was comfortable for pt.  Also came with an understocking which fit well.    Compression Bandaging . TG soft, elastomull to toes 1-4, , comprilan soft 10 cm to knee, 15 cm to thigh from knee, 6 cm foot wrap with figure 8's, 8 cm heel locks, 12 cm wrap to knee, and 1 12 cm  mid leg to thigh, 1 12 cm wrap knee to thigh, 1 12 cm above knee to thigh. TG soft  pulled down over the top.                       PT Short Term Goals - 05/15/21 1207       PT SHORT TERM GOAL #1   Title Pt/husband will be able to perform self manual lymph drainage or report using pump with appropriate sleeve    Time 4    Period Weeks    Status On-going    Target Date 05/17/21      PT SHORT TERM GOAL #2   Title Pt/ husband will be able to do self compression bandaging    Time 4    Status Achieved      PT SHORT TERM GOAL #3   Title Pt will report she knows how to use exercise to help control her lymphedema    Period Weeks    Status On-going               PT Long Term Goals - 05/15/21 1206       PT LONG TERM GOAL #1   Title Pt will report she has or knows how to get appropriate day and nighttime garments to help control her lymphedema    Time 8    Period Weeks    Status Achieved    Target Date 05/08/21      PT LONG TERM GOAL #2   Title Pt reports she knows how to control her lymphedema at home with lymph drainage manually or with pump, compression and exercise    Time 8    Period Weeks    Status On-going                   Plan - 05/26/21 1141     Clinical Impression Statement Pt brought in her [employer] night garment; It is slightly long but comfortable for pt, however, it did not contain the separate foot piece as indicated on the back of the package..  Emailed [employer] to let them know so it could be corrected.  Remeasured pt and pt has had good reduction since she was last measured in February. She was told she can try the night garment as long as her foot is wrapped if she wants to try on Saturday night and see how  it feels.  She still does not have her compression stocking and will continue to require daytime bandaging until it comes in.  She is also awaiting her Flexit[employer] device.  She will need to continue therapy until she has received all garments to maintain reduction of her leg.    Comorbidities chronic lymphedema,  abdominal surgery for cervical cancer removal with 40 lymph nodes removed, right total knee in June 2022.    Examination-Activity Limitations Locomotion Level;Stairs    Stability/Clinical Decision Making Stable/Uncomplicated    Rehab Potential Excellent    PT Frequency 3x / week    PT Duration 8 weeks    PT Treatment/Interventions ADLs/Self Care Home Management;Therapeutic exercise;Patient/family education;Orthotic Fit/Training;Manual techniques;Manual lymph drainage;Compression bandaging;Scar mobilization;Taping;Passive range of motion    PT Next Visit Plan continue MLD, compression bandaging, while awaiting  and pump    PT Home Exercise Plan husband bandaging, trunk MLD when using basic pump    Consulted and Agree with Plan of Care Patient             Patient will benefit from skilled therapeutic intervention in order to improve the following deficits and impairments:  Increased edema, Decreased knowledge of use of DME, Decreased knowledge of precautions  Visit Diagnosis: Lymphedema, not elsewhere classified     Problem List Patient Active Problem List   Diagnosis Date Noted   Encounter for preoperative examination for general surgical procedure 08/18/2020   Insomnia due to anxiety and fear 08/18/2020   Stress at home- of MOM who is ETOH abuse 06/17/2019   Hyperlipidemia 06/17/2019   HLD (hyperlipidemia)    Obesity 12/05/2018   Vitamin D deficiency 12/05/2018   Postmenopausal 12/05/2018   Family history of diabetes mellitus in father 12/05/2018   Impaired fasting glucose 12/05/2018   Small cell carcinoma ([place])- of the cervix 12/05/2018   Insect bite 09/17/2018   Healthcare maintenance 09/17/2018   Generalized muscle ache 05/15/2018   Hip pain, bilateral 05/15/2018   Chronic pain of both shoulders 05/15/2018   Essential hypertension 01/29/2018   Insomnia 01/29/2018   Attention deficit hyperactivity disorder (ADHD) 01/29/2018   Mood disorder - mixed depression and  anxiety 01/29/2018   Excessive drinking of alcohol 01/29/2018   Encounter for long-term (current) use of high-risk medication 01/29/2018   Inactivity 01/29/2018   Health education/counseling 01/29/2018   Chronic anticoagulation 10/06/2016   Paroxysmal atrial fibrillation (HCC) 10/05/2016   Mitral valve disease    PAF (paroxysmal atrial fibrillation) ([place]) 07/01/2013   LAFB (left anterior fascicular block) 07/01/2013   Lymphedema 07/01/2013   Chest pain at rest 10/01/2011   Leg swelling 10/01/2011   h/o Cervical cancer 10/01/2011   H/O total hysterectomy 10/01/2011    Claris Pong, PT 05/26/2021, 11:46 AM  [facility] @ [place], [place], [address] Phone: [phone]   Fax:  [phone]  Name: DANIE HANNIG MRN: [id number] Date of Birth: [birth year]

## 2021-05-28 ENCOUNTER — Inpatient Hospital Stay (HOSPITAL_COMMUNITY): Payer: Medicare Other

## 2021-05-28 ENCOUNTER — Emergency Department (HOSPITAL_COMMUNITY): Payer: Medicare Other

## 2021-05-28 ENCOUNTER — Inpatient Hospital Stay (HOSPITAL_COMMUNITY)
Admission: EM | Admit: 2021-05-28 | Discharge: 2021-05-29 | DRG: 061 | Disposition: A | Discharge status: Home / Self Care | Payer: Medicare Other | Attending: Neurology | Admitting: Neurology

## 2021-05-28 ENCOUNTER — Encounter (HOSPITAL_COMMUNITY): Payer: Self-pay

## 2021-05-28 ENCOUNTER — Other Ambulatory Visit: Payer: Self-pay

## 2021-05-28 DIAGNOSIS — I6349 Cerebral infarction due to embolism of other cerebral artery: Secondary | ICD-10-CM

## 2021-05-28 DIAGNOSIS — I63411 Cerebral infarction due to embolism of right middle cerebral artery: Principal | ICD-10-CM

## 2021-05-28 DIAGNOSIS — I480 Paroxysmal atrial fibrillation: Secondary | ICD-10-CM | POA: Diagnosis present

## 2021-05-28 DIAGNOSIS — I10 Essential (primary) hypertension: Secondary | ICD-10-CM | POA: Diagnosis present

## 2021-05-28 DIAGNOSIS — Z823 Family history of stroke: Secondary | ICD-10-CM

## 2021-05-28 DIAGNOSIS — Z96651 Presence of right artificial knee joint: Secondary | ICD-10-CM | POA: Diagnosis present

## 2021-05-28 DIAGNOSIS — Z9282 Status post administration of tPA (rtPA) in a different facility within the last 24 hours prior to admission to current facility: Secondary | ICD-10-CM

## 2021-05-28 DIAGNOSIS — Z8249 Family history of ischemic heart disease and other diseases of the circulatory system: Secondary | ICD-10-CM | POA: Diagnosis not present

## 2021-05-28 DIAGNOSIS — Z79899 Other long term (current) drug therapy: Secondary | ICD-10-CM

## 2021-05-28 DIAGNOSIS — U071 COVID-19: Secondary | ICD-10-CM | POA: Diagnosis present

## 2021-05-28 DIAGNOSIS — I6389 Other cerebral infarction: Secondary | ICD-10-CM

## 2021-05-28 DIAGNOSIS — G8194 Hemiplegia, unspecified affecting left nondominant side: Secondary | ICD-10-CM | POA: Diagnosis present

## 2021-05-28 DIAGNOSIS — E669 Obesity, unspecified: Secondary | ICD-10-CM | POA: Diagnosis present

## 2021-05-28 DIAGNOSIS — Z6834 Body mass index (BMI) 34.0-34.9, adult: Secondary | ICD-10-CM | POA: Diagnosis not present

## 2021-05-28 DIAGNOSIS — I161 Hypertensive emergency: Secondary | ICD-10-CM

## 2021-05-28 DIAGNOSIS — R569 Unspecified convulsions: Secondary | ICD-10-CM | POA: Diagnosis present

## 2021-05-28 DIAGNOSIS — R29810 Facial weakness: Secondary | ICD-10-CM | POA: Diagnosis present

## 2021-05-28 DIAGNOSIS — I639 Cerebral infarction, unspecified: Secondary | ICD-10-CM | POA: Diagnosis present

## 2021-05-28 DIAGNOSIS — R4182 Altered mental status, unspecified: Secondary | ICD-10-CM | POA: Diagnosis not present

## 2021-05-28 DIAGNOSIS — G8191 Hemiplegia, unspecified affecting right dominant side: Secondary | ICD-10-CM | POA: Diagnosis present

## 2021-05-28 DIAGNOSIS — Z8541 Personal history of malignant neoplasm of cervix uteri: Secondary | ICD-10-CM | POA: Diagnosis not present

## 2021-05-28 DIAGNOSIS — Z7901 Long term (current) use of anticoagulants: Secondary | ICD-10-CM | POA: Diagnosis not present

## 2021-05-28 DIAGNOSIS — G459 Transient cerebral ischemic attack, unspecified: Secondary | ICD-10-CM | POA: Diagnosis not present

## 2021-05-28 DIAGNOSIS — I4891 Unspecified atrial fibrillation: Secondary | ICD-10-CM | POA: Diagnosis not present

## 2021-05-28 DIAGNOSIS — E785 Hyperlipidemia, unspecified: Secondary | ICD-10-CM | POA: Diagnosis present

## 2021-05-28 DIAGNOSIS — I083 Combined rheumatic disorders of mitral, aortic and tricuspid valves: Secondary | ICD-10-CM | POA: Diagnosis present

## 2021-05-28 DIAGNOSIS — R29703 NIHSS score 3: Secondary | ICD-10-CM | POA: Diagnosis present

## 2021-05-28 DIAGNOSIS — J8410 Pulmonary fibrosis, unspecified: Secondary | ICD-10-CM | POA: Diagnosis not present

## 2021-05-28 DIAGNOSIS — I63439 Cerebral infarction due to embolism of unspecified posterior cerebral artery: Secondary | ICD-10-CM

## 2021-05-28 DIAGNOSIS — Z8673 Personal history of transient ischemic attack (TIA), and cerebral infarction without residual deficits: Secondary | ICD-10-CM | POA: Diagnosis present

## 2021-05-28 DIAGNOSIS — R29818 Other symptoms and signs involving the nervous system: Secondary | ICD-10-CM | POA: Diagnosis not present

## 2021-05-28 DIAGNOSIS — R4781 Slurred speech: Secondary | ICD-10-CM | POA: Diagnosis not present

## 2021-05-28 LAB — I-STAT CHEM 8, ED
BUN: 20 mg/dL (ref 8–23)
Calcium, Ion: 1.29 mmol/L (ref 1.15–1.40)
Chloride: 101 mmol/L (ref 98–111)
Creatinine, Ser: 1.1 mg/dL — ABNORMAL HIGH (ref 0.44–1.00)
Glucose, Bld: 88 mg/dL (ref 70–99)
HCT: 42 % (ref 36.0–46.0)
Hemoglobin: 14.3 g/dL (ref 12.0–15.0)
Potassium: 3.2 mmol/L — ABNORMAL LOW (ref 3.5–5.1)
Sodium: 141 mmol/L (ref 135–145)
TCO2: 28 mmol/L (ref 22–32)

## 2021-05-28 LAB — RAPID URINE DRUG SCREEN, HOSP PERFORMED
Amphetamines: NOT DETECTED
Barbiturates: NOT DETECTED
Benzodiazepines: NOT DETECTED
Cocaine: NOT DETECTED
Opiates: NOT DETECTED
Tetrahydrocannabinol: NOT DETECTED

## 2021-05-28 LAB — ECHOCARDIOGRAM COMPLETE
AR max vel: 2.4 cm2
AV Area VTI: 2.16 cm2
AV Area mean vel: 2.24 cm2
AV Mean grad: 3 mmHg
AV Peak grad: 5.9 mmHg
Ao pk vel: 1.21 m/s
Area-P 1/2: 3.68 cm2
MV M vel: 5.34 m/s
MV Peak grad: 114.1 mmHg
S' Lateral: 3 cm
Weight: 3142.88 oz

## 2021-05-28 LAB — URINALYSIS, COMPLETE (UACMP) WITH MICROSCOPIC
Bacteria, UA: NONE SEEN
Bilirubin Urine: NEGATIVE
Glucose, UA: NEGATIVE mg/dL
Hgb urine dipstick: NEGATIVE
Ketones, ur: NEGATIVE mg/dL
Leukocytes,Ua: NEGATIVE
Nitrite: NEGATIVE
Protein, ur: NEGATIVE mg/dL
Specific Gravity, Urine: 1.011 (ref 1.005–1.030)
pH: 7 (ref 5.0–8.0)

## 2021-05-28 LAB — COMPREHENSIVE METABOLIC PANEL
ALT: 11 U/L (ref 0–44)
AST: 24 U/L (ref 15–41)
Albumin: 4 g/dL (ref 3.5–5.0)
Alkaline Phosphatase: 60 U/L (ref 38–126)
Anion gap: 12 (ref 5–15)
BUN: 19 mg/dL (ref 8–23)
CO2: 25 mmol/L (ref 22–32)
Calcium: 9.6 mg/dL (ref 8.9–10.3)
Chloride: 102 mmol/L (ref 98–111)
Creatinine, Ser: 1.02 mg/dL — ABNORMAL HIGH (ref 0.44–1.00)
GFR, Estimated: 59 mL/min — ABNORMAL LOW (ref >60)
Glucose, Bld: 93 mg/dL (ref 70–99)
Potassium: 3.2 mmol/L — ABNORMAL LOW (ref 3.5–5.1)
Sodium: 139 mmol/L (ref 135–145)
Total Bilirubin: 0.6 mg/dL (ref 0.3–1.2)
Total Protein: 7 g/dL (ref 6.5–8.1)

## 2021-05-28 LAB — DIFFERENTIAL
Abs Immature Granulocytes: 0.08 10*3/uL — ABNORMAL HIGH (ref 0.00–0.07)
Basophils Absolute: 0 10*3/uL (ref 0.0–0.1)
Basophils Relative: 0 %
Eosinophils Absolute: 0.3 10*3/uL (ref 0.0–0.5)
Eosinophils Relative: 2 %
Immature Granulocytes: 1 %
Lymphocytes Relative: 38 %
Lymphs Abs: 4.3 10*3/uL — ABNORMAL HIGH (ref 0.7–4.0)
Monocytes Absolute: 0.8 10*3/uL (ref 0.1–1.0)
Monocytes Relative: 8 %
Neutro Abs: 5.6 10*3/uL (ref 1.7–7.7)
Neutrophils Relative %: 51 %

## 2021-05-28 LAB — TYPE AND SCREEN
ABO/RH(D): A POS
Antibody Screen: NEGATIVE

## 2021-05-28 LAB — CBC
HCT: 40.7 % (ref 36.0–46.0)
Hemoglobin: 13.4 g/dL (ref 12.0–15.0)
MCH: 31.5 pg (ref 26.0–34.0)
MCHC: 32.9 g/dL (ref 30.0–36.0)
MCV: 95.8 fL (ref 80.0–100.0)
Platelets: 350 10*3/uL (ref 150–400)
RBC: 4.25 MIL/uL (ref 3.87–5.11)
RDW: 13.4 % (ref 11.5–15.5)
WBC: 11.1 10*3/uL — ABNORMAL HIGH (ref 4.0–10.5)
nRBC: 0 % (ref 0.0–0.2)

## 2021-05-28 LAB — RESP PANEL BY RT-PCR (FLU A&B, COVID) ARPGX2
Influenza A by PCR: NEGATIVE
Influenza B by PCR: NEGATIVE
SARS Coronavirus 2 by RT PCR: POSITIVE — AB

## 2021-05-28 LAB — PROTIME-INR
INR: 0.9 (ref 0.8–1.2)
Prothrombin Time: 12.2 seconds (ref 11.4–15.2)

## 2021-05-28 LAB — MAGNESIUM: Magnesium: 1.7 mg/dL (ref 1.7–2.4)

## 2021-05-28 LAB — PHOSPHORUS: Phosphorus: 4.5 mg/dL (ref 2.5–4.6)

## 2021-05-28 LAB — TROPONIN I (HIGH SENSITIVITY): Troponin I (High Sensitivity): 45 ng/L — ABNORMAL HIGH (ref <18)

## 2021-05-28 LAB — CBG MONITORING, ED: Glucose-Capillary: 81 mg/dL (ref 70–99)

## 2021-05-28 LAB — MRSA NEXT GEN BY PCR, NASAL: MRSA by PCR Next Gen: NOT DETECTED

## 2021-05-28 LAB — HIV ANTIBODY (ROUTINE TESTING W REFLEX): HIV Screen 4th Generation wRfx: NONREACTIVE

## 2021-05-28 LAB — ETHANOL: Alcohol, Ethyl (B): 16 mg/dL — ABNORMAL HIGH (ref <10)

## 2021-05-28 LAB — APTT: aPTT: 26 seconds (ref 24–36)

## 2021-05-28 MED ORDER — TENECTEPLASE FOR STROKE
0.2500 mg/kg | PACK | Freq: Once | INTRAVENOUS | Status: AC
  Administered 2021-05-28: 22 mg via INTRAVENOUS
  Filled 2021-05-28: qty 10

## 2021-05-28 MED ORDER — CHLORHEXIDINE GLUCONATE CLOTH 2 % EX PADS
6.0000 | MEDICATED_PAD | Freq: Every day | CUTANEOUS | Status: DC
  Administered 2021-05-28 – 2021-05-29 (×2): 6 via TOPICAL

## 2021-05-28 MED ORDER — SODIUM CHLORIDE 0.9 % IV SOLN: INTRAVENOUS | Status: DC

## 2021-05-28 MED ORDER — STROKE: EARLY STAGES OF RECOVERY BOOK: Freq: Once | Status: AC

## 2021-05-28 MED ORDER — PANTOPRAZOLE SODIUM 40 MG IV SOLR: 40.0000 mg | Freq: Every day | INTRAVENOUS | Status: DC

## 2021-05-28 MED ORDER — ACETAMINOPHEN 160 MG/5ML PO SOLN: 650.0000 mg | ORAL | Status: DC | PRN

## 2021-05-28 MED ORDER — ACETAMINOPHEN 325 MG PO TABS
650.0000 mg | ORAL_TABLET | ORAL | Status: DC | PRN
  Administered 2021-05-29: 650 mg via ORAL
  Filled 2021-05-28: qty 2

## 2021-05-28 MED ORDER — PANTOPRAZOLE SODIUM 40 MG PO TBEC
40.0000 mg | DELAYED_RELEASE_TABLET | Freq: Every day | ORAL | Status: DC
  Administered 2021-05-28: 40 mg via ORAL
  Filled 2021-05-28: qty 1

## 2021-05-28 MED ORDER — LABETALOL HCL 5 MG/ML IV SOLN
10.0000 mg | Freq: Once | INTRAVENOUS | Status: AC
  Administered 2021-05-28: 10 mg via INTRAVENOUS

## 2021-05-28 MED ORDER — LABETALOL HCL 5 MG/ML IV SOLN
10.0000 mg | INTRAVENOUS | Status: DC | PRN
  Administered 2021-05-28: 10 mg via INTRAVENOUS
  Filled 2021-05-28: qty 4

## 2021-05-28 MED ORDER — ACETAMINOPHEN 650 MG RE SUPP: 650.0000 mg | RECTAL | Status: DC | PRN

## 2021-05-28 MED ORDER — SODIUM CHLORIDE 0.9% FLUSH
3.0000 mL | Freq: Once | INTRAVENOUS | Status: AC
  Administered 2021-05-28: 3 mL via INTRAVENOUS

## 2021-05-28 MED ORDER — MAGNESIUM SULFATE 2 GM/50ML IV SOLN
2.0000 g | Freq: Once | INTRAVENOUS | Status: AC
  Administered 2021-05-28: 2 g via INTRAVENOUS
  Filled 2021-05-28: qty 50

## 2021-05-28 MED ORDER — POTASSIUM CHLORIDE CRYS ER 20 MEQ PO TBCR
40.0000 meq | EXTENDED_RELEASE_TABLET | Freq: Once | ORAL | Status: AC
  Administered 2021-05-28: 40 meq via ORAL
  Filled 2021-05-28: qty 2

## 2021-05-28 MED ORDER — POTASSIUM CHLORIDE 10 MEQ/100ML IV SOLN: 10.0000 meq | INTRAVENOUS | Status: DC

## 2021-05-28 MED ORDER — SENNOSIDES-DOCUSATE SODIUM 8.6-50 MG PO TABS: 1.0000 | ORAL_TABLET | Freq: Two times a day (BID) | ORAL | Status: DC

## 2021-05-28 NOTE — Evaluation (Signed)
Occupational Therapy Evaluation ?Patient Details ?Name: Savannah Hill ?MRN: [id number] ?DOB: 1950-04-05 ?Today's Date: 05/28/2021 ? ? ?History of Present Illness This 71 y.o. female admitted with Lt sided facial droop and Rt sided weakness.  TNK administered.    MRI pending.  PMH includes: Anemia, cervical CA, HLD, HTN, A-Fib, s/p TKA  ? ?Clinical Impression ?  ?Pt admitted with the above listed diagnosis.  She demonstrates deficits with problem solving.  She is able to complete ADLs independently and appears to be at baseline from a physical standpoint.  She was unable to perform serial 7 subtraction.   Pt and spouse insist this would be baseline for her, but she made glaring errors.  PTA pt was fully independent and very active.   After speaking with SLP, pt does appear to have cognitive deficits which were apparent with further testing.  Recommend OPOT at discharge.  No further acute OT needs identified.   ?   ? ?Recommendations for follow up therapy are one component of a multi-disciplinary discharge planning process, led by the attending physician.  Recommendations may be updated based on patient status, additional functional criteria and insurance authorization.  ? ?Follow Up Recommendations ? Outpatient OT  ?  ?Assistance Recommended at Discharge Intermittent Supervision/Assistance  ?Patient can return home with the following Direct supervision/assist for financial management;Direct supervision/assist for medications management ? ?  ?Functional Status Assessment ? Patient has had a recent decline in their functional status and demonstrates the ability to make significant improvements in function in a reasonable and predictable amount of time.  ?Equipment Recommendations ? None recommended by OT  ?  ?Recommendations for Other Services   ? ? ?  ?Precautions / Restrictions Precautions ?Precautions: None  ? ?  ? ?Mobility Bed Mobility ?Overal bed mobility: Independent ?  ?  ?  ?  ?  ?  ?  ?   ? ?Transfers ?Overall transfer level: Independent ?  ?  ?  ?  ?  ?  ?  ?  ?  ?  ? ?  ?Balance Overall balance assessment: No apparent balance deficits (not formally assessed) (pt able to retrieve items from floor and from overhead without LOB.  Able to stand one leg with Lt and Rt LEs > 10 seconds) ?  ?  ?  ?  ?  ?  ?  ?  ?  ?  ?  ?  ?  ?  ?  ?  ?  ?  ?   ? ?ADL either performed or assessed with clinical judgement  ? ?ADL Overall ADL's : Independent ?  ?  ?  ?  ?  ?  ?  ?  ?  ?  ?  ?  ?  ?  ?  ?  ?  ?  ?  ?General ADL Comments: simulated shower in standing.  ? ? ? ?Vision Baseline Vision/History: 0 No visual deficits ?Ability to See in Adequate Light: 0 Adequate ?Vision Assessment?: Yes ?Eye Alignment: Within Functional Limits ?Ocular Range of Motion: Within Functional Limits ?Alignment/Gaze Preference: Within Defined Limits ?Tracking/Visual Pursuits: Able to track stimulus in all quads without difficulty ?Convergence: Within functional limits ?Visual Fields: No apparent deficits  ?   ?Perception   ?  ?Praxis   ?  ? ?Pertinent Vitals/Pain Pain Assessment ?Pain Assessment: No/denies pain  ? ? ? ?Hand Dominance Right ?  ?Extremity/Trunk Assessment Upper Extremity Assessment ?Upper Extremity Assessment: Overall WFL for tasks assessed ?  ?Lower Extremity Assessment ?Lower  Extremity Assessment: Overall WFL for tasks assessed ?  ?Cervical / Trunk Assessment ?Cervical / Trunk Assessment: Normal ?  ?Communication Communication ?Communication: No difficulties ?  ?Cognition Arousal/Alertness: Awake/alert ?Behavior During Therapy: [facility] for tasks assessed/performed ?Overall Cognitive Status: Impaired/Different from baseline ?  ?  ?  ?  ?  ?  ?  ?  ?  ?  ?  ?  ?  ?  ?  ?  ?General Comments: Pt scored 2/28 on the Short Blessed Test.  She was unable to perform serial 7 subtraction.  With multiple attempts and with using her fingers, she was able to accurately subtract 7 twice, but unable to continue.  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 ?  ?  ?General Comments  spouse present.   Discussed post stroke fatigue. ? ?  ?Exercises   ?  ?Shoulder Instructions    ? ? ?Home Living Family/patient expects to be discharged to:: Private residence ?Living Arrangements: Spouse/significant other ?Available Help at Discharge: Family;Available PRN/intermittently;Available 24 hours/day ?Type of Home: House ?Home Access: Stairs to enter ?Entrance Stairs-Number of Steps: 4 ?  ?Home Layout: Two level ?  ?  ?Bathroom Shower/Tub: Walk-in shower ?  ?Bathroom Toilet: Standard ?  ?  ?Home Equipment: None ?  ?  ? Lives With: Spouse ? ?  ?Prior Functioning/Environment Prior Level of Function : Independent/Modified Independent;Driving ?  ?  ?  ?  ?  ?  ?Mobility Comments: is independent - does have lymphedeme in Rt LE ?  ?  ? ?  ?  ?OT Problem List: Decreased cognition ?  ?   ?OT Treatment/Interventions:    ?  ?OT Goals(Current goals can be found in the care plan section) Acute Rehab OT Goals ?Patient Stated Goal: to go home ?OT Goal Formulation: All assessment and education complete, DC therapy  ?OT Frequency:   ?  ? ?Co-evaluation   ?  ?  ?  ?  ? ?  ?AM-PAC OT "6 Clicks" Daily Activity     ?Outcome Measure Help from another person eating meals?: None ?Help from another person taking care of personal grooming?: None ?Help from another person toileting, which includes using toliet, bedpan, or urinal?: None ?Help from another person bathing (including washing, rinsing, drying)?: None ?Help from another person to put on and taking off regular upper body clothing?: None ?Help from another person to put on and taking off regular lower body clothing?: None ?6 Click Score: 24 ?  ?End of Session Nurse Communication: Mobility status ? ?Activity Tolerance: Patient tolerated treatment well ?Patient left: in bed;with call bell/phone within reach;with family/visitor present ? ?OT Visit Diagnosis: Cognitive  communication deficit (R41.841) ?Symptoms and signs involving cognitive functions: Cerebral infarction  ?              ?Time: 4128-7867 ?OT Time Calculation (min): 44 min ?Charges:  OT General Charges ?$OT Visit: 1 Visit ?OT Evaluation ?$OT Eval Moderate Complexity: 1 Mod ?OT Treatments ?$Self Care/Home Management : 8-22 mins ?$Therapeutic Activity: 8-22 mins ?Poppi Scantling C., OTR/L ?Acute Rehabilitation Services ?Pager [phone] ?Office [phone] ? ? ?Lucille Passy M ?05/28/2021, 2:29 PM ?

## 2021-05-28 NOTE — H&P (Signed)
Please see my note 05/28/2021  4:23 AM ? ?Electronically signed by:  ?Lynnae Sandhoff, MD ?Page: [phone] ?05/28/2021, 6:36 AM ? ? ?

## 2021-05-28 NOTE — Consult Note (Addendum)
Neurology Consult H&P ? ?Savannah Hill ?MR# [id number] ?05/28/2021 ? ?CC: left face droop. ? ?History is obtained from: patient and chart. ? ?HPI: Savannah Hill is a 71 y.o. female PMHx as reviewed below at home and her husband noticed left sided facial droop with right sided weakness at 0145. Her [place] [address].  ? ?Patient arrives shivering. ? ?EMS reports HTN and atrial fibrillation with RVR.  ? ?  ?Taken to CT on arrival - Labetalol at 0407, TNK given at [place].  ? ?Had bronchitis last week. ? ? ?LKW: 2300 ?tNK given: Yes ?IR Thrombectomy No, not indicated ?Modified Rankin Scale: 0-Completely asymptomatic and back to baseline post- stroke ?NIHSS: 3 ? ?ROS: A complete ROS was performed and is negative except as noted in the HPI.  ?Past Medical History:  ?Diagnosis Date  ? Anemia   ? borderline  ? Atrial fibrillation ([place])   ? a. s/p ablation on 10/05/2016  ? Cancer [facility])   ? cervical/rad hysterectomy/bso wiht chemo for small cell ca  ? Dyspnea   ? Heart valve disorder   ? HLD (hyperlipidemia)   ? Hypertension   ? Joint pain   ? Lower extremity edema   ? Obesity   ? Palpitations   ? ?Family History  ?Problem Relation Age of Onset  ? Diabetes Mother   ? Hypertension Mother   ? Lung cancer Mother   ? Heart disease Mother   ? Thyroid disease Mother   ? Depression Mother   ? Alcoholism Mother   ? Skin cancer Father   ? CVA Father   ? Diabetes Father   ? Hypertension Father   ? Cancer Paternal Grandfather   ?     unknown type  ? ?Social History:  reports that she has never smoked. She has never used smokeless tobacco. She reports current alcohol use of about 4.0 - 5.0 standard drinks per week. She reports that she does not use drugs. ? ?Prior to Admission medications   ?Medication Sig Start Date End Date Taking? Authorizing Provider  ?ALPRAZolam (XANAX) 0.5 MG tablet Take 1 tablet (0.5 mg total) by mouth at bedtime as needed for anxiety. 08/18/20   Ronnell Freshwater, NP  ?Cholecalciferol (VITAMIN D3) 125 MCG (5000 UT)  CAPS Take 1 capsule by mouth daily.    [provider]  ?citalopram (CELEXA) 20 MG tablet Take 1 tablet (20 mg total) by mouth daily. 02/17/21   Megan Salon, MD  ?diltiazem [facility]) 120 MG 24 hr capsule TAKE 2 CAPSULES DAILY, TO  GOAL BLOOD PRESSURE AROUND 130/80 02/21/21   Abonza, Maritza, PA-C  ?docusate sodium (COLACE) 100 MG capsule Take 100 mg by mouth daily.    [provider]  ?furosemide (LASIX) 20 MG tablet TAKE 1 TABLET DAILY 02/06/21   Lorrene Reid, PA-C  ?Multiple Vitamin (MULTIVITAMIN WITH MINERALS) TABS Take 1 tablet by mouth daily.    [provider]  ?rosuvastatin (CRESTOR) 5 MG tablet TAKE 1 TABLET AT BEDTIME 02/21/21   Lorrene Reid, PA-C  ?traZODone (DESYREL) 100 MG tablet TAKE 1 TABLET AT BEDTIME ASNEEDED FOR SLEEP 03/27/21   Lorrene Reid, PA-C  ? ?Exam: ?Current vital signs: ?Wt 89.1 kg   LMP 03/19/1984   BMI 34.80 kg/m?  ? ?Physical Exam  ?Constitutional: Appears well-developed and well-nourished.  ?Psych: Affect appropriate to situation ?Eyes: No scleral injection ?HENT: No OP obstruction. ?Head: Normocephalic.  ?Cardiovascular: Normal rate and regular rhythm.  ?Respiratory: Effort normal, symmetric excursions bilaterally, no audible wheezing. ?  GI: Soft.  No distension. There is no tenderness.  ?Skin: WDI ? ?Neuro: ?Mental Status: ?Patient is awake, alert, oriented to person, place, month, year, and situation. ?Patient is able to give a clear and coherent history. ?Speech fluent, intact comprehension and repetition. ?No signs of aphasia or neglect. ?Visual Fields are full. Pupils are equal, round, and reactive to light. ?EOMI without ptosis or diplopia. ?No nystagmus, saccadic intrusions. ?Weakness on maintaining left eye closure ?Facial sensation is symmetric to temperature ?Left lower face weakness with drooling.  ?Hearing is intact to voice. ?Uvula midline and palate elevates symmetrically. ?Shoulder shrug is symmetric. ?Tongue is midline with small  laceration on left anterolateral aspect.  ?Tone is normal. Bulk is normal. 5/5 strength was present in all four extremities. ?Sensation is symmetric to light touch and temperature in the arms and legs. ?Deep Tendon Reflexes: 2+ and symmetric in the biceps and patellae. ?Toes are downgoing bilaterally there is lymphedema in the right lower extremity with significant bandage. ?Coordination: No decomposition of movement, dysmetria ?Gait - Deferred ? ?I have reviewed labs in epic and the pertinent results are: ?CBG 81 ? ?I have reviewed the images obtained: ?NCT head showed no acute ischemic changes, hemorrhage mass.   ? ?Assessment: Savannah Hill is a 71 y.o. female PMHx as noted above with hypertensive emergency and acute onset left face weakness suspicious for stroke.  Though aspects of her exam suggested forehead sparing Bell's palsy, EMS did report atrial fibrillation with RVR and will move forward with thrombolytics. ? ?There was a delay in administering thrombolytics as the patient was hypertensive and required labetalol and repeat blood pressure check. ? ?Plan: ?Patient administered tPA/tNK, with no contraindications to administration.  ? ? ?Plan: ?Admit to ICU for 24 hours with frequent neuro checks.  ?Labs: CBC, type and cross, CMP, Mg, Phos, lipids, HbA1c, troponin, urinalysis. ?Replete electrolytes as needed. ?Post tNK blood pressure: <180/171m Hg for first 24 hours the gradual reduction over next few days. ?Maintain O2 sats > 94% ?Normothermia - For temperature >37.5C - acetaminophen '650mg'$  q4-6 hours PRN. ?Repeat CT head in 6 hours if patient has not had MRI brain. ?No antiplatelet medications or anticoagulants for 24 hours following tPA/tNK and after imaging.  ?Blood pressure management per post tPA/tNK-protocol order set.  ?Gentle IV hydration.  ?Relative euglycemia and treat hyperglycemia (>200 mg/dL)/hypoglycemia (< '60mg'$ /dL). ?PT/OT/Speech. ?MRI of brain, MRA of head and neck, TTE.  ?Telemetry  monitoring for arrhythmia. ?Bedside swallow screen. ?Stroke education. ?N.p.o. until cleared by speech. ?Recommend PT/OT/SLP consult. ?If there is acute neurologic decline STAT CT head.  ? ?PPx: ?    GI - H2 ?    DVT - SCDs for now. ?Precautions: Aspiration/seizure/fall. ? ? ?This patient is critically ill and at significant risk of neurological worsening, death and care requires constant monitoring of vital signs, hemodynamics,respiratory and cardiac monitoring, neurological assessment, discussion with family, other specialists and medical decision making of high complexity. I spent 73 minutes of neurocritical care time  in the care of  this patient. This was time spent independent of any time provided by nurse practitioner or PA. ? ?Electronically signed by:  ?HLynnae Sandhoff MD ?Page: [phone]?05/28/2021, 4:23 AM ? ?If 7pm- 7am, please page neurology on call as listed [place] ? ?

## 2021-05-28 NOTE — ED Provider Notes (Cosign Needed)
[facility] Emergency Department Provider Note MRN:  [id number]  [place] date & time: 05/28/21     Chief Complaint   Code Stroke   History of Present Illness   Savannah Hill is a 71 y.o. year-old female presents to the ED with chief complaint of left sided facial droop.  Husband states that he noticed L sided facial droop with right sided weakness at 0145.  [place] 2300.  NIHSS 3 on arrival. She denies any pain.     Review of Systems  Pertinent review of systems noted in HPI.    Physical Exam   Vitals:   05/28/21 0445 05/28/21 0500  BP: (!) 146/94 139/81  Pulse: 79 78  Resp: 16 19  Temp: 98.2 F (36.8 C)   SpO2: 94% 94%    CONSTITUTIONAL:  well-appearing, NAD NEURO:  Alert and oriented x 3, left sided facial droop EYES:  eyes equal and reactive ENT/NECK:  Supple, no stridor  CARDIO:  normal rate, regular rhythm, appears well-perfused  PULM:  No respiratory distress,  GI/GU:  non-distended,  MSK/SPINE:  No gross deformities, no edema, moves all extremities  SKIN:  no rash, atraumatic   *Additional and/or pertinent findings included in MDM below  Diagnostic and Interventional Summary    Labs Reviewed  CBC - Abnormal; Notable for the following components:      Result Value   WBC 11.1 (*)    All other components within normal limits  DIFFERENTIAL - Abnormal; Notable for the following components:   Lymphs Abs 4.3 (*)    Abs Immature Granulocytes 0.08 (*)    All other components within normal limits  COMPREHENSIVE METABOLIC PANEL - Abnormal; Notable for the following components:   Potassium 3.2 (*)    Creatinine, Ser 1.02 (*)    GFR, Estimated 59 (*)    All other components within normal limits  I-STAT CHEM 8, ED - Abnormal; Notable for the following components:   Potassium 3.2 (*)    Creatinine, Ser 1.10 (*)    All other components within normal limits  RESP PANEL BY RT-PCR (FLU A&B, COVID) ARPGX2  PROTIME-INR  APTT  CBG  MONITORING, ED    CT HEAD CODE STROKE WO CONTRAST  Final Result      Medications  labetalol (NORMODYNE) injection 10 mg (has no administration in time range)  sodium chloride flush (NS) 0.9 % injection 3 mL (3 mLs Intravenous Given 05/28/21 0436)  tenecteplase (TNKASE) injection for Stroke 22 mg (22 mg Intravenous Given 05/28/21 0407)  labetalol (NORMODYNE) injection 10 mg (10 mg Intravenous Given 05/28/21 0407)     Procedures  /  Critical Care .Critical Care Performed by: Montine Circle, PA-C Authorized by: Montine Circle, PA-C   Critical care provider statement:    Critical care time (minutes):  35   Critical care was necessary to treat or prevent imminent or life-threatening deterioration of the following conditions:  CNS failure or compromise   Critical care was time spent personally by me on the following activities:  Development of treatment plan with patient or surrogate, discussions with consultants, evaluation of patient's response to treatment, examination of patient, ordering and review of laboratory studies, ordering and review of radiographic studies, ordering and performing treatments and interventions, pulse oximetry, re-evaluation of patient's condition and review of old charts  ED Course and Medical Decision Making  I have reviewed the triage vital signs, the nursing notes, and pertinent available records from the EMR.  Complexity of Problems Addressed:  High Complexity: Acute illness/injury posing a threat to life or bodily function, requiring emergent diagnostic workup, evaluation, and treatment as below. Comorbidities affecting this illness/injury include: HTN, paroxysmal afib Social Determinants Affecting Care: Complexity of care is increased due to .   ED Course: After considering the following differential, stroke, bell's palsy, I agree with code stroke orders. I visualized the CT, which is notable for no obvious stroke and agree with radiologist  interpretation..     Consultants: Dr. Theda Sers from neurology is appreciated for admitting.  Treatment and Plan: Patient is within the code stroke window.  Patient treated with TNKASE by neuro.  Neurology to admit.  Patient's exam and diagnostic results are concerning for stroke.  Feel that patient will need admission to the hospital for further treatment and evaluation.    Final Clinical Impressions(s) / ED Diagnoses     ICD-10-CM   1. Stroke ([place])  I63.9 DG CHEST PORT 1 VIEW    DG CHEST PORT 1 VIEW      ED Discharge Orders     None         Discharge Instructions Discussed with and Provided to Patient:   Discharge Instructions   None      Montine Circle, PA-C 05/28/21 0601

## 2021-05-28 NOTE — Progress Notes (Addendum)
PHARMACIST CODE STROKE RESPONSE ? ?Notified to mix TNK at 0403 by Dr. Theda Sers ?Delivered TNK to RN at [place] ? ?TNK dose = 22 mg (4.4 mL) IV over 5 seconds ? ?Issues/delays encountered (if applicable): None ? ?FYI: Due to Daylight Savings Time, pt remained in the time window for TNK ? ?Narda Bonds, PharmD, BCPS ?Clinical Pharmacist ?Phone: [phone] ? ?

## 2021-05-28 NOTE — Evaluation (Signed)
Speech Language Pathology Evaluation ?Patient Details ?Name: Savannah Hill ?MRN: [id number] ?DOB: [birth year] ?Today's Date: 05/28/2021 ?Time: 9937-1696 ?SLP Time Calculation (min) (ACUTE ONLY): 22 min ? ?Problem List:  ?Patient Active Problem List  ? Diagnosis Date Noted  ? Stroke (cerebrum) ([place]) 05/28/2021  ? Encounter for preoperative examination for general surgical procedure 08/18/2020  ? Insomnia due to anxiety and fear 08/18/2020  ? Stress at home- of MOM who is ETOH abuse 06/17/2019  ? Hyperlipidemia 06/17/2019  ? HLD (hyperlipidemia)   ? Obesity 12/05/2018  ? Vitamin D deficiency 12/05/2018  ? Postmenopausal 12/05/2018  ? Family history of diabetes mellitus in father 12/05/2018  ? Impaired fasting glucose 12/05/2018  ? Small cell carcinoma ([place])- of the cervix 12/05/2018  ? Insect bite 09/17/2018  ? Healthcare maintenance 09/17/2018  ? Generalized muscle ache 05/15/2018  ? Hip pain, bilateral 05/15/2018  ? Chronic pain of both shoulders 05/15/2018  ? Essential hypertension 01/29/2018  ? Insomnia 01/29/2018  ? Attention deficit hyperactivity disorder (ADHD) 01/29/2018  ? Mood disorder - mixed depression and anxiety 01/29/2018  ? Excessive drinking of alcohol 01/29/2018  ? Encounter for long-term (current) use of high-risk medication 01/29/2018  ? Inactivity 01/29/2018  ? Health education/counseling 01/29/2018  ? Chronic anticoagulation 10/06/2016  ? Paroxysmal atrial fibrillation ([place]) 10/05/2016  ? Mitral valve disease   ? PAF (paroxysmal atrial fibrillation) ([place]) 07/01/2013  ? LAFB (left anterior fascicular block) 07/01/2013  ? Lymphedema 07/01/2013  ? Chest pain at rest 10/01/2011  ? Leg swelling 10/01/2011  ? h/o Cervical cancer 10/01/2011  ? H/O total hysterectomy 10/01/2011  ? ?Past Medical History:  ?Past Medical History:  ?Diagnosis Date  ? Anemia   ? borderline  ? Atrial fibrillation ([place])   ? a. s/p ablation on 10/05/2016  ? Cancer [facility])   ? cervical/rad hysterectomy/bso wiht chemo for small  cell ca  ? Dyspnea   ? Heart valve disorder   ? HLD (hyperlipidemia)   ? Hypertension   ? Joint pain   ? Lower extremity edema   ? Obesity   ? Palpitations   ? ?Past Surgical History:  ?Past Surgical History:  ?Procedure Laterality Date  ? ABLATION OF DYSRHYTHMIC FOCUS  10/05/2016  ? ATRIAL FIBRILLATION ABLATION N/A 10/05/2016  ? Procedure: Atrial Fibrillation Ablation;  Surgeon: Constance Haw, MD;  Location: [facility];  Service: Cardiovascular;  Laterality: N/A;  ? IR RADIOLOGY PERIPHERAL GUIDED IV START  09/28/2016  ? IR US GUIDE VASC ACCESS RIGHT  09/28/2016  ? RADICAL ABDOMINAL HYSTERECTOMY  1986  ? with BSO  ? REPLACEMENT TOTAL KNEE Right 2022  ? TEE WITHOUT CARDIOVERSION N/A 06/07/2016  ? Procedure: TRANSESOPHAGEAL ECHOCARDIOGRAM (TEE);  Surgeon: Sanda Klein, MD;  Location: [facility] ENDOSCOPY;  Service: Cardiovascular;  Laterality: N/A;  ? ?HPI:  ?Pt is a 71 y.o. female who presented to the ED due to left sided facial droop with right sided weakness. TNK given. CT head negative. MRI pending at time of evaluation. EEG WNL. PMH: Anemia, cervical CA, HLD, HTN, A-Fib, s/p TKA  ? ?Assessment / Plan / Recommendation ?Clinical Impression ? Pt participated in speech-language-cognition evaluation with her husband present. Both parties denied the pt having any baseline deficits in the aforementioned areas. Pt and her husband initially denied any acute changes in cognition, but at the end of the evaluation, pt expressed that more complex tasks were surprisingly difficult and her husband stated that her cognition appeared at least a "a little" different than at baseline.  Motor speech and language skills were [facility] Mental Status Examination was completed to evaluate the pt's cognitive-linguistic skills. She achieved a score of 25/30 which is below the normal limits of 27 or more out of 30 and is suggestive of a mild impairment. She exhibited difficulty in the areas of awareness,  selective attention, memory, complex problem solving, and executive function. Skilled SLP services are clinically indicated at this time to improve cognitive-linguistic function. ?   ?SLP Assessment ? SLP Recommendation/Assessment: Patient needs continued [facility] Services ?SLP Visit Diagnosis: Cognitive communication deficit (R41.841)  ?  ?Recommendations for follow up therapy are one component of a multi-disciplinary discharge planning process, led by the attending physician.  Recommendations may be updated based on patient status, additional functional criteria and insurance authorization. ?   ?Follow Up Recommendations ? Outpatient SLP  ?  ?Assistance Recommended at Discharge ? Set up Supervision/Assistance  ?Functional Status Assessment Patient has had a recent decline in their functional status and demonstrates the ability to make significant improvements in function in a reasonable and predictable amount of time.  ?Frequency and Duration min 2x/week  ?2 weeks ?  ?   ?SLP Evaluation ?Cognition ? Overall Cognitive Status: Impaired/Different from baseline ?Arousal/Alertness: Awake/alert ?Orientation Level: Oriented X4 ?Year: 2023 ?Month: March ?Day of Week: Correct ?Attention: Focused;Sustained;Selective ?Focused Attention: Appears intact ?Sustained Attention: Appears intact ?Selective Attention: Impaired ?Selective Attention Impairment: Verbal complex ?Memory: Impaired ?Memory Impairment: Retrieval deficit;Storage deficit (Immediate: 5/5 with repetition; delayed: 4/5; with cues: 1/1) ?Awareness: Impaired ?Awareness Impairment: Emergent impairment ?Problem Solving: Impaired ?Problem Solving Impairment: Verbal complex (Safety: 3/3; money: 1/3; Time: 1/1) ?Executive Function: Organizing;Reasoning;Sequencing ?Reasoning: Appears intact ?Sequencing: Impaired ?Sequencing Impairment: Verbal complex (clock drawing: 2/4) ?Organizing: Appears intact (backward digit span: 3/3)  ?  ?   ?Comprehension ?  Auditory Comprehension ?Overall Auditory Comprehension: Appears within functional limits for tasks assessed ?Yes/No Questions: Within Functional Limits ?Commands: Within Functional Limits ?Conversation: Complex  ?  ?Expression Expression ?Primary Mode of Expression: Verbal ?Verbal Expression ?Overall Verbal Expression: Appears within functional limits for tasks assessed ?Initiation: No impairment ?Level of Generative/Spontaneous Verbalization: Conversation ?Repetition: No impairment ?Naming: No impairment ?Pragmatics: No impairment ?Written Expression ?Dominant Hand: Right   ?Oral / Motor ? Oral Motor/Sensory Function ?Overall Oral Motor/Sensory Function: Within functional limits ?Motor Speech ?Overall Motor Speech: Appears within functional limits for tasks assessed ?Respiration: Within functional limits ?Phonation: Normal ?Resonance: Within functional limits ?Articulation: Within functional limitis ?Intelligibility: Intelligible ?Motor Planning: Witnin functional limits ?Motor Speech Errors: Not applicable   ?        ?Azelia Reiger I. Hardin Negus, [place], CCC-SLP ?Acute Rehabilitation Services ?Office number [phone] ?Pager [phone] ? ?Savannah Hill ?05/28/2021, 2:01 PM ? ? ? ? ?

## 2021-05-28 NOTE — Progress Notes (Signed)
EEG complete - results pending 

## 2021-05-28 NOTE — ED Triage Notes (Addendum)
BIB [facility] after husband noticed L sided facial droop with R sided weakness at 0145. [place] 2300. NIHSS 3 on arrival. HTN enroute.  ? ?Taken to CT on arrival - Labetalol at 0407, TNK given at [place] per Dr. Theda Sers.   ? ? ?

## 2021-05-28 NOTE — Procedures (Signed)
Patient Name: Savannah Hill  ?MRN: [id number]  ?Epilepsy Attending: Lora Havens  ?Referring Physician/Provider: Janine Ores, NP ?Date: 05/28/2021 ?Duration: 21.32 mins ? ?Patient history: 71 y.o. female PMHx as noted above with hypertensive emergency and acute onset left face weakness suspicious for stroke. EEG to evaluate for seizure.  ? ?Level of alertness: Awake ? ?AEDs during EEG study: None ? ?Technical aspects: This EEG study was done with scalp electrodes positioned according to the 10-20 International system of electrode placement. Electrical activity was acquired at a sampling rate of '500Hz'$  and reviewed with a high frequency filter of '70Hz'$  and a low frequency filter of '1Hz'$ . EEG data were recorded continuously and digitally stored.  ? ?Description: The posterior dominant rhythm consists of 8 Hz activity of moderate voltage (25-35 uV) seen predominantly in posterior head regions, symmetric and reactive to eye opening and eye closing. Physiologic photic driving was not seen during photic stimulation.  Hyperventilation was not performed.    ? ?Of note, study was technically difficult due to significant myogenic artifact  ? ?IMPRESSION: ?This technically difficult study is within normal limits. No seizures or epileptiform discharges were seen throughout the recording. ? ?If suspicion for interictal activity remains a concern, a repeat study can be considered.  ? ?Lora Havens  ? ?

## 2021-05-28 NOTE — Progress Notes (Addendum)
STROKE TEAM PROGRESS NOTE   INTERVAL HISTORY Tested positive for COVID Ablation in 2018 and off eliquis in 2022 Normal night routine, husband noticed her thrashing in the bed and asked if she was okay and he said he was calling 911. She does not remember biting her tongue or thrashing. He touched her leg and she stopped and the restarted again. She remembers him turning the lights on and the ems crew showing up.  EEG and EKG ordered.  Plan for MRI tonight with and without contrast.  Vitals:   05/28/21 0630 05/28/21 0645 05/28/21 0700 05/28/21 0800  BP: (!) 111/97 121/76 (!) 142/98 140/82  Pulse: 75 75 77 78  Resp: '20 19 15 '$ (!) 23  Temp:      TempSrc:      SpO2: 94% 93% 95% 92%  Weight:       CBC:  Recent Labs  Lab 05/28/21 0353 05/28/21 0356  WBC 11.1*  --   NEUTROABS 5.6  --   HGB 13.4 14.3  HCT 40.7 42.0  MCV 95.8  --   PLT 350  --    Basic Metabolic Panel:  Recent Labs  Lab 05/28/21 0353 05/28/21 0356 05/28/21 0531  NA 139 141  --   K 3.2* 3.2*  --   CL 102 101  --   CO2 25  --   --   GLUCOSE 93 88  --   BUN 19 20  --   CREATININE 1.02* 1.10*  --   CALCIUM 9.6  --   --   MG  --   --  1.7  PHOS  --   --  4.5   Lipid Panel: No results for input(s): CHOL, TRIG, HDL, CHOLHDL, VLDL, LDLCALC in the last 168 hours. HgbA1c: No results for input(s): HGBA1C in the last 168 hours. Urine Drug Screen: No results for input(s): LABOPIA, COCAINSCRNUR, LABBENZ, AMPHETMU, THCU, LABBARB in the last 168 hours.  Alcohol Level  Recent Labs  Lab 05/28/21 0531  ETH 16*    IMAGING past 24 hours DG CHEST PORT 1 VIEW  Result Date: 05/28/2021 CLINICAL DATA:  71 year old female with history of stroke. EXAM: PORTABLE CHEST 1 VIEW COMPARISON:  Chest x-ray 10/01/2011. FINDINGS: Lung volumes are normal. No consolidative airspace disease. No pleural effusions. No pneumothorax. Small calcified granulomas are noted in the left lung base. No other pulmonary nodule or mass noted. Pulmonary  vasculature and the cardiomediastinal silhouette are within normal limits. IMPRESSION: No radiographic evidence of acute cardiopulmonary disease. Electronically Signed   By: Vinnie Langton M.D.   On: 05/28/2021 05:33   CT HEAD CODE STROKE WO CONTRAST  Result Date: 05/28/2021 CLINICAL DATA:  Code stroke. EXAM: CT HEAD WITHOUT CONTRAST TECHNIQUE: Contiguous axial images were obtained from the base of the skull through the vertex without intravenous contrast. RADIATION DOSE REDUCTION: This exam was performed according to the departmental dose-optimization program which includes automated exposure control, adjustment of the mA and/or kV according to patient size and/or use of iterative reconstruction technique. COMPARISON:  None available. FINDINGS: Brain: Examination mildly degraded by motion. Cerebral volume within normal limits for age. Mild chronic small vessel ischemic disease. No acute intracranial hemorrhage. No convincing large vessel territory infarct. No mass lesion, mass effect or midline shift. No hydrocephalus or extra-axial fluid collection. Vascular: No hyperdense vessel. Scattered vascular calcifications noted within the carotid siphons. Skull: Scalp soft tissues demonstrate no acute finding. Calvarium intact. Sinuses/Orbits: Right gaze noted. Globes and orbital soft tissues otherwise unremarkable.  Mild mucosal thickening noted about the maxillary sinuses. Paranasal sinuses are otherwise largely clear. No mastoid or middle ear effusion. Other: None. ASPECTS [facility] Stroke Program Early CT Score) - Ganglionic level infarction (caudate, lentiform nuclei, internal capsule, insula, M1-M3 cortex): 7 - Supraganglionic infarction (M4-M6 cortex): 3 Total score (0-10 with 10 being normal): 10 IMPRESSION: 1. No acute intracranial abnormality. 2. ASPECTS is 10. 3. Mild chronic small vessel ischemic disease. These results were communicated to Dr. Theda Sers at 4:10 am on 05/28/2021 by text page via the [facility]  messaging system. Electronically Signed   By: Jeannine Boga M.D.   On: 05/28/2021 04:11    PHYSICAL EXAM  Physical Exam  Constitutional: Appears well-developed and well-nourished.  Seen walking from the bathroom with RN assistance.  Cardiovascular: Normal rate, rhythm appears irregular on monitor Respiratory: Effort normal, non-labored breathing  Neuro: Mental Status: Patient is awake, alert, oriented to person, place, month, year, and situation. Patient is able to give a clear and coherent history. No signs of aphasia or neglect Cranial Nerves: II: Visual Fields are full. Pupils are equal, round, and reactive to light.   III,IV, VI: EOMI without ptosis or diploplia.  V: Facial sensation is symmetric to temperature VII: Facial movement is symmetric resting and smiling VIII: Hearing is intact to voice X: Palate elevates symmetrically XI: Shoulder shrug is symmetric. XII: Tongue protrudes midline without atrophy or fasciculations.  Motor: Tone is normal. Bulk is normal. 5/5 strength was present in all four extremities.  Sensory: Sensation is symmetric to light touch and temperature in the arms and legs. No extinction to DSS present.  Deep Tendon Reflexes: 2+ and symmetric in the biceps and patellae.  Plantars: Toes are downgoing bilaterally.  Cerebellar: FNF and HKS are intact bilaterally    ASSESSMENT/PLAN Ms. KENITHA GLENDINNING is a 71 y.o. female with history of atrial fibrillation, anemia, cervical cancer, dyspnea, heart valve disorder, HLD, HTN, joint pain presenting with left side facial droop and right side weakness starting at 0145 on 3/12.  Patient states that she woke her husband up with sudden jerking motions in the bed, she does not remember this.  She remembers him turning the light on and EMS arriving.  When she awoke her husband stated that she had a left facial droop and right-sided weakness, but she does not remember this.  He also showed her this morning  that she had bitten her tongue, which she did not realize.  She was previously on Eliquis after her ablation until September 2022.  She is willing to resume Eliquis if necessary.  EKG ordered.  EEG ordered.  Stroke vsTIA secondary to history of atrial fibrillation s/p ablation and eliquis d/c'd in 11/2020 Code Stroke CT head No acute abnormality. ASPECTS 10.    MRI pending MRA pending EEG-significant myogenic artifact, technically within normal limits 2D Echo Pending LDL 77 HgbA1c 5.9 VTE prophylaxis - SCDs    Diet   Diet NPO time specified   No antithrombotic prior to admission, now on No antithrombotic until 24 hrs post TNKase and MRI is done Therapy recommendations:  Pending Disposition:  Pending  Hypertension Home meds:  None Stable Permissive hypertension (OK if < 220/120) but gradually normalize in 5-7 days Long-term BP goal normotensive  Hyperlipidemia Home meds:  Rosuvastatin, resumed in hospital LDL 77, goal < 70 Continue statin at discharge   Other Stroke Risk Factors Advanced Age >/= 65  ETOH use, alcohol level 16, advised to drink no more than 1 drink(s) a  day Obesity, Body mass index is 34.8 kg/m., BMI >/= 30 associated with increased stroke risk, recommend weight loss, diet and exercise as appropriate   Other Active Problems   Hospital day # 0  Patient seen and examined by NP/APP with MD. MD to update note as needed.   Janine Ores, DNP, FNP-BC [facility] Pager: [phone]  ATTENDING ATTESTATION:  71 year old female with history of atrial fibrillation, anemia, cervical cancer, heart valve disorder.  She sees cardiology outpatient and she had an ablation for atrial fibrillation was initially on Eliquis but stopped in fall of last year due to cardiology recommendations.  She can feel when she has atrial fibrillation events.  She did notice with her recent bronchitis and flareup she would have heart palpitations.  She had acute onset left  facial weakness and right-sided extremity weakness came in as acute stroke and received TNK.  She mentions just that she had an episode of thrashing around in the bed that her husband noticed S3s and called EMS initially.  She also bit her left-sided tongue.  She denies losing control of urine or bowel movement.  No history of seizures previously.  She did tell that she drank a little too much the night before and had 3 drinks of Shearon Stalls which is more than her usual baseline of wine.  For this reason we ordered EEG this may be a provoked seizure event.  EEG is negative.  No need for seizure medications at this time.  I discussed with her that alcohol may lower seizure threshold.  Brain MRI will be with and without contrast later this evening.  CT for this morning canceled as she is stable.  Dr. Reeves Forth evaluated pt independently, reviewed imaging, chart, labs. Discussed and formulated plan with the APP. Please see APP note above for details.      This patient is critically ill due to stroke s/p tPA and at significant risk of neurological worsening, death form heart failure, respiratory failure, recurrent stroke, bleeding from [facility], seizure, sepsis. This patient's care requires constant monitoring of vital signs, hemodynamics, respiratory and cardiac monitoring, review of multiple databases, neurological assessment, discussion with family, other specialists and medical decision making of high complexity. I spent 35 minutes of neurocritical care time in the care of this patient.   Erasto Sleight,MD   To contact Stroke Continuity provider, please refer to [email or web address] After hours, contact General Neurology

## 2021-05-28 NOTE — ED Notes (Signed)
Pt failed swallow screen d/t cough after drinking. Dr. Theda Sers notified.  ?

## 2021-05-28 NOTE — Code Documentation (Signed)
Responded to Code Stroke called at [phone] for L facial droop and dysarthria, [id number] yesterday. Pt arrived at 0351(Daylight savings time), NIH-3 for L facial droop and dysarthria, CBG-87, CT head negative for acute changes. BP-184/93 prior to TNK administration. '10mg'$  labetalol and TNK given at 0407. Plan to admit to ICU.  ?

## 2021-05-28 NOTE — Progress Notes (Signed)
eLink Physician-Brief Progress Note ?Patient Name: Savannah Hill ?DOB: [birth year] ?MRN: [id number] ? ? ?Date of Service ? 05/28/2021  ?HPI/Events of Note ? 71 year old woman with HTN, admitted with likely CVA, s/p thrombolysis. Neuro is primary. Awake and alert on camera. HR in 70s, SBP in 130s at this time.   ?eICU Interventions ? Plan per neuro.   ? ? ? ?Intervention Category ?Evaluation Type: New Patient Evaluation ? ?Chon Buhl G Diego Delancey ?05/28/2021, 6:52 AM ?

## 2021-05-28 NOTE — Progress Notes (Signed)
PT Cancellation Note ? ?Patient Details ?Name: Savannah Hill ?MRN: [id number] ?DOB: 07-Jan-1951 ? ? ?Cancelled Treatment:    Reason Eval/Treat Not Completed: Active bedrest order ? ?Wyona Almas, PT, DPT ?Acute Rehabilitation Services ?Pager [phone] ?Office [phone] ? ? ? ?[name] ?05/28/2021, 7:05 AM ?

## 2021-05-28 NOTE — Progress Notes (Signed)
Echocardiogram ?2D Echocardiogram has been performed. ? ?Arlyss Gandy ?05/28/2021, 3:35 PM ?

## 2021-05-29 ENCOUNTER — Ambulatory Visit: Payer: Medicare Other

## 2021-05-29 DIAGNOSIS — Z7901 Long term (current) use of anticoagulants: Secondary | ICD-10-CM

## 2021-05-29 DIAGNOSIS — I63411 Cerebral infarction due to embolism of right middle cerebral artery: Principal | ICD-10-CM

## 2021-05-29 DIAGNOSIS — R569 Unspecified convulsions: Secondary | ICD-10-CM

## 2021-05-29 DIAGNOSIS — I480 Paroxysmal atrial fibrillation: Secondary | ICD-10-CM

## 2021-05-29 LAB — CBC
HCT: 32.9 % — ABNORMAL LOW (ref 36.0–46.0)
Hemoglobin: 11.2 g/dL — ABNORMAL LOW (ref 12.0–15.0)
MCH: 31.9 pg (ref 26.0–34.0)
MCHC: 34 g/dL (ref 30.0–36.0)
MCV: 93.7 fL (ref 80.0–100.0)
Platelets: 273 10*3/uL (ref 150–400)
RBC: 3.51 MIL/uL — ABNORMAL LOW (ref 3.87–5.11)
RDW: 13.7 % (ref 11.5–15.5)
WBC: 14.3 10*3/uL — ABNORMAL HIGH (ref 4.0–10.5)
nRBC: 0 % (ref 0.0–0.2)

## 2021-05-29 LAB — HEMOGLOBIN A1C
Hgb A1c MFr Bld: 5.9 % — ABNORMAL HIGH (ref 4.8–5.6)
Mean Plasma Glucose: 123 mg/dL

## 2021-05-29 LAB — BASIC METABOLIC PANEL
Anion gap: 7 (ref 5–15)
BUN: 8 mg/dL (ref 8–23)
CO2: 26 mmol/L (ref 22–32)
Calcium: 8.5 mg/dL — ABNORMAL LOW (ref 8.9–10.3)
Chloride: 106 mmol/L (ref 98–111)
Creatinine, Ser: 0.9 mg/dL (ref 0.44–1.00)
GFR, Estimated: >60 mL/min (ref >60)
Glucose, Bld: 103 mg/dL — ABNORMAL HIGH (ref 70–99)
Potassium: 4.3 mmol/L (ref 3.5–5.1)
Sodium: 139 mmol/L (ref 135–145)

## 2021-05-29 LAB — LIPID PANEL
Cholesterol: 126 mg/dL (ref 0–200)
HDL: 51 mg/dL (ref >40)
LDL Cholesterol: 65 mg/dL (ref 0–99)
Total CHOL/HDL Ratio: 2.5 RATIO
Triglycerides: 52 mg/dL (ref <150)
VLDL: 10 mg/dL (ref 0–40)

## 2021-05-29 LAB — MAGNESIUM: Magnesium: 2 mg/dL (ref 1.7–2.4)

## 2021-05-29 LAB — ABO/RH: ABO/RH(D): A POS

## 2021-05-29 MED ORDER — CITALOPRAM HYDROBROMIDE 10 MG PO TABS
20.0000 mg | ORAL_TABLET | Freq: Every day | ORAL | Status: DC
  Administered 2021-05-29: 20 mg via ORAL
  Filled 2021-05-29: qty 2

## 2021-05-29 MED ORDER — GADOBUTROL 1 MMOL/ML IV SOLN
9.0000 mL | Freq: Once | INTRAVENOUS | Status: AC | PRN
  Administered 2021-05-29: 9 mL via INTRAVENOUS

## 2021-05-29 MED ORDER — FUROSEMIDE 20 MG PO TABS
20.0000 mg | ORAL_TABLET | Freq: Every day | ORAL | Status: DC
Start: 2021-05-29 — End: 2021-05-29
  Administered 2021-05-29: 20 mg via ORAL
  Filled 2021-05-29: qty 1

## 2021-05-29 MED ORDER — DILTIAZEM HCL ER COATED BEADS 120 MG PO CP24
240.0000 mg | ORAL_CAPSULE | Freq: Every day | ORAL | Status: DC
  Administered 2021-05-29: 240 mg via ORAL
  Filled 2021-05-29 (×2): qty 2

## 2021-05-29 MED ORDER — ROSUVASTATIN CALCIUM 5 MG PO TABS
5.0000 mg | ORAL_TABLET | Freq: Every day | ORAL | Status: DC
  Administered 2021-05-29: 5 mg via ORAL
  Filled 2021-05-29: qty 1

## 2021-05-29 MED ORDER — ADULT MULTIVITAMIN W/MINERALS CH
1.0000 | ORAL_TABLET | Freq: Every day | ORAL | Status: DC
  Administered 2021-05-29: 1 via ORAL
  Filled 2021-05-29: qty 1

## 2021-05-29 MED ORDER — APIXABAN 5 MG PO TABS
5.0000 mg | ORAL_TABLET | Freq: Two times a day (BID) | ORAL | Status: DC
  Administered 2021-05-29: 5 mg via ORAL
  Filled 2021-05-29: qty 1

## 2021-05-29 MED ORDER — LEVETIRACETAM 500 MG PO TABS
500.0000 mg | ORAL_TABLET | Freq: Two times a day (BID) | ORAL | Status: DC
  Administered 2021-05-29: 500 mg via ORAL
  Filled 2021-05-29: qty 1

## 2021-05-29 MED ORDER — LEVETIRACETAM 500 MG PO TABS: 500.0000 mg | ORAL_TABLET | Freq: Two times a day (BID) | ORAL | 2 refills | Status: DC

## 2021-05-29 MED ORDER — APIXABAN 5 MG PO TABS: 5.0000 mg | ORAL_TABLET | Freq: Two times a day (BID) | ORAL | 2 refills | Status: DC

## 2021-05-29 NOTE — Progress Notes (Signed)
ANTICOAGULATION CONSULT NOTE ? ?Pharmacy Consult for Eliquis ?Indication: atrial fibrillation ? ?Labs: ?Recent Labs  ?  05/28/21 ?0353 05/28/21 ?0356 05/29/21 ?0643  ?HGB 13.4 14.3 11.2*  ?HCT 40.7 42.0 32.9*  ?PLT 350  --  273  ?APTT 26  --   --   ?LABPROT 12.2  --   --   ?INR 0.9  --   --   ?CREATININE 1.02* 1.10* 0.90  ? ? ?Assessment: ?51 YOF previously on Eliquis for hx of atrial fibrillation, s/p ablation in 2018, stopped September 2022 by cardiology ?Patient is indicated for oral anticoagulation (CHA2DSVASc of 4 per EP)  ?Hgb decreased to 11.2  ?Platelets are stable and WNL  ?No bleeding issues reported  ? ?Goal of Therapy:  ?Stroke prevention  ?  ?Plan:  ?Eliquis 5 mg PO BID  ?Monitor CBC closely, s/sx of bleeding  ? ?Eliseo Gum, PharmD Candidate  ?05/29/2021 2:11 PM  ?

## 2021-05-29 NOTE — Progress Notes (Signed)
?  Transition of Care (TOC) Screening Note ? ? ?Patient Details  ?Name: Savannah Hill ?Date of Birth: [birth year] ? ? ?Transition of Care (TOC) CM/SW Contact:    ?Benard Halsted, LCSW ?Phone Number: ?05/29/2021, 10:08 AM ? ? ? ?Transition of Care Department [facility]) has reviewed patient. We will continue to monitor patient advancement through interdisciplinary progression rounds. If new patient transition needs arise, please place a TOC consult. ? ? ?

## 2021-05-29 NOTE — Evaluation (Signed)
Physical Therapy Evaluation ?Patient Details ?Name: Savannah Hill ?MRN: [id number] ?DOB: 1950-04-27 ?Today's Date: 05/29/2021 ? ?History of Present Illness ? This 71 y.o. female admitted with Lt sided facial droop and Rt sided weakness.  TNK administered.    MRI pending.  PMH includes: Anemia, cervical CA, HLD, HTN, A-Fib, s/p TKA. Pt is currently seeing Outpatient PT for lymphedema managment of her R LE.  ?Clinical Impression ? Pt was independent with bed mobility and transfers. Assistance was provided with ambulation, on stairs, and during higher level balance testing for line management and safety. Pt scored a 23/24 on DGI testing which is indicative of a community ambulator. Pt had a drop in O2 saturation after performing stair training and was educated on increasing activity tolerance. Not recommending physical therapy post-acute. Pt's functional mobility is back to baseline, PT is signing off. ?   ? ?Recommendations for follow up therapy are one component of a multi-disciplinary discharge planning process, led by the attending physician.  Recommendations may be updated based on patient status, additional functional criteria and insurance authorization. ? ?Follow Up Recommendations No PT follow up ? ?  ?Assistance Recommended at Discharge PRN  ?Patient can return home with the following ?   ? ?  ?Equipment Recommendations None recommended by PT  ?Recommendations for Other Services ?    ?  ?Functional Status Assessment Patient has not had a recent decline in their functional status  ? ?  ?Precautions / Restrictions Precautions ?Precautions: None ?Precaution Comments: On COVID percautions  ? ?  ? ?Mobility ? Bed Mobility ?Overal bed mobility: Independent ?  ?  ?  ?  ?  ?  ?  ?  ? ?Transfers ?Overall transfer level: Independent ?  ?  ?  ?  ?  ?  ?  ?  ?  ?  ? ?Ambulation/Gait ?Ambulation/Gait assistance: Supervision ?Gait Distance (Feet): 200 Feet ?Assistive device: None ?Gait Pattern/deviations: WFL(Within  Functional Limits), Decreased stride length ?  ?  ?  ?General Gait Details: Pt ambulated in the hallway with supervision for line managment. Pt was min gaurd for DGI tasks. ? ?Stairs ?Stairs: Yes ?Stairs assistance: Min guard ?Stair Management: One rail Right, Forwards, Alternating pattern ?Number of Stairs: 10 ?General stair comments: Pt was able to ascend and descend stairs with min guard for line management. Pt O2 saturation dropped to 90% after stairs ? ?Wheelchair Mobility ?  ? ?Modified Rankin (Stroke Patients Only) ?Modified Rankin (Stroke Patients Only) ?Pre-Morbid Rankin Score: No symptoms ?Modified Rankin: No significant disability ? ?  ? ?Balance Overall balance assessment: Independent ?  ?  ?  ?  ?  ?  ?  ?  ?  ?  ?  ?  ?  ?  ?  ?Standardized Balance Assessment ?Standardized Balance Assessment : Dynamic Gait Index ?  ?Dynamic Gait Index ?Level Surface: Normal ?Change in Gait Speed: Normal ?Gait with Horizontal Head Turns: Normal ?Gait with Vertical Head Turns: Normal ?Gait and Pivot Turn: Normal ?Step Over Obstacle: Normal ?Step Around Obstacles: Normal ?Steps: Mild Impairment ?Total Score: 23 ?   ? ? ? ?Pertinent Vitals/Pain    ? ? ?Home Living Family/patient expects to be discharged to:: Private residence ?Living Arrangements: Spouse/significant other ?Available Help at Discharge: Family;Available PRN/intermittently;Available 24 hours/day ?Type of Home: House ?Home Access: Stairs to enter ?  ?Entrance Stairs-Number of Steps: 4 ?Alternate Level Stairs-Number of Steps: 10 ?Home Layout: Two level ?Home Equipment: None ?   ?  ?Prior Function Prior Level  of Function : Independent/Modified Independent;Driving ?  ?  ?  ?  ?  ?  ?Mobility Comments: is independent - does have lymphedema in Rt LE ?  ?  ? ? ?Hand Dominance  ? Dominant Hand: Right ? ?  ?Extremity/Trunk Assessment  ?   ?  ? ?Lower Extremity Assessment ?Lower Extremity Assessment: RLE deficits/detail;Overall [facility] for tasks assessed ?RLE Deficits /  Details: Pt is undergoing therapy for lymphedema ?  ? ?Cervical / Trunk Assessment ?Cervical / Trunk Assessment: Normal  ?Communication  ? Communication: No difficulties  ?Cognition Arousal/Alertness: Awake/alert ?Behavior During Therapy: [facility] for tasks assessed/performed ?Overall Cognitive Status: Within Functional Limits for tasks assessed ?  ?  ?  ?  ?  ?  ?  ?  ?  ?  ?  ?  ?  ?  ?  ?  ?General Comments: Pt was A+Ox4 and followed commans throughout the session. ?  ?  ? ?  ?General Comments General comments (skin integrity, edema, etc.): Pt occaionally had light use of railing on stairs. Pt was educated on increasing activity tolerance in a graded manor. ? ?  ?Exercises    ? ?Assessment/Plan  ?  ?PT Assessment Patient does not need any further PT services  ?PT Problem List   ? ?   ?  ?PT Treatment Interventions     ? ?PT Goals (Current goals can be found in the Care Plan section)  ?Acute Rehab PT Goals ?PT Goal Formulation: All assessment and education complete, DC therapy ? ?  ?Frequency   ?  ? ? ?Co-evaluation   ?  ?  ?  ?  ? ? ?  ?AM-PAC PT "6 Clicks" Mobility  ?Outcome Measure Help needed turning from your back to your side while in a flat bed without using bedrails?: None ?Help needed moving from lying on your back to sitting on the side of a flat bed without using bedrails?: None ?Help needed moving to and from a bed to a chair (including a wheelchair)?: None ?Help needed standing up from a chair using your arms (e.g., wheelchair or bedside chair)?: None ?Help needed to walk in hospital room?: None ?Help needed climbing 3-5 steps with a railing? : None ?6 Click Score: 24 ? ?  ?End of Session Equipment Utilized During Treatment: Gait belt ?Activity Tolerance: Patient tolerated treatment well ?Patient left: in bed ?  ?PT Visit Diagnosis: Other symptoms and signs involving the nervous system (R29.898) ?  ? ?Time: 5188-4166 ?PT Time Calculation (min) (ACUTE ONLY): 23 min ? ? ?Charges:   PT Evaluation ?$PT Eval  Low Complexity: 1 Low ?  ?  ?   ? ? ?Quenton Fetter, SPT ? ? ?Quenton Fetter ?05/29/2021, 1:25 PM ? ?

## 2021-05-29 NOTE — Consult Note (Signed)
Cardiology Consultation:   Patient ID: Savannah Hill MRN: [id number]; DOB: [birth year]  Admit date: 05/28/2021 Date of Consult: 05/29/2021  PCP:  Savannah Hill, [place] Providers Cardiologist:  Savannah Haw, MD        Patient Profile:   Savannah Hill is a 71 y.o. female with a hx of HTN, obesity, PVCs, AFib, lymphedema (2/2 h/o pelvic lymph node dissection), chronic venous insufficiency (follows with vasc, Savannah Hill), who is being seen 05/29/2021 for the evaluation of AFib hx at the request of Savannah Hill.  Afib Hx Diagnosed 2013 PVI ablation 2018  History of Present Illness:   Savannah Hill last saw Savannah Hill Sept 2022, she was s/p knee surgery w/RLE swelling, otherwise doing well.  She had not had any symptoms of recurrent AFib at that time and her Eliquis discontinued.  She was admitted yesterday when her husband noticed a L sided facial droop and L sided weakness, neurology consult mentions EMS found her hypertensive and apparently in rapid Afib HTN treated with labetalol and then did get tPA/tNK  MRI noted: 1.2 cm acute ischemic cortical infarct involving the posterior right frontal region. Associated susceptibility artifact at this level could reflect mild petechial hemorrhage versus a prominent vessel  EP is asked to see her today to discuss her a/c going forward  I have reviewed EMS tracings Artifact is noted, 12 leads are SR, single lead strips are irregular though I suspect also are SR can not r/o AFib BP with 190/120 and 208/126  EKG here is SR, PACs  LABS K+ 3.2 >> 4.3 BUN/Creat 8/0.90 Mag 1.7, 2.0 WBC 11.1 > 14.3 H/H 11/32 Plts 272  COVID +  She is doing very well, post stroke.Shew was pretty sick 2 weeks ago with a cough/cold illness, her ribs hurt she was coughing so much, "bad bronchitis" was prescribed prednisone for a bronchitis flare, she also reports given a ZPack and tesselon pearls.  She went to [place] on a trip  they had planned and tried her best to keep up with everyone. She does think she was having at least intermittent AFib through this.   Past Medical History:  Diagnosis Date   Anemia    borderline   Atrial fibrillation ([place])    a. s/p ablation on 10/05/2016   Cancer [facility])    cervical/rad hysterectomy/bso wiht chemo for small cell ca   Dyspnea    Heart valve disorder    HLD (hyperlipidemia)    Hypertension    Joint pain    Lower extremity edema    Obesity    Palpitations     Past Surgical History:  Procedure Laterality Date   ABLATION OF DYSRHYTHMIC FOCUS  10/05/2016   ATRIAL FIBRILLATION ABLATION N/A 10/05/2016   Procedure: Atrial Fibrillation Ablation;  Surgeon: Savannah Haw, MD;  Location: [facility];  Service: Cardiovascular;  Laterality: N/A;   IR RADIOLOGY PERIPHERAL GUIDED IV START  09/28/2016   IR US GUIDE VASC ACCESS RIGHT  09/28/2016   RADICAL ABDOMINAL HYSTERECTOMY  1986   with BSO   REPLACEMENT TOTAL KNEE Right 2022   TEE WITHOUT CARDIOVERSION N/A 06/07/2016   Procedure: TRANSESOPHAGEAL ECHOCARDIOGRAM (TEE);  Surgeon: Sanda Klein, MD;  Location: [facility] ENDOSCOPY;  Service: Cardiovascular;  Laterality: N/A;     Home Medications:  Prior to Admission medications   Medication Sig Start Date End Date Taking? Authorizing Provider  benzonatate (TESSALON) 100 MG capsule Take 100 mg by mouth 3 (three) times  daily as needed for cough. 05/20/21  Yes [provider]  Cholecalciferol (VITAMIN D3) 125 MCG (5000 UT) CAPS Take 1 capsule by mouth daily.   Yes [provider]  citalopram (CELEXA) 20 MG tablet Take 1 tablet (20 mg total) by mouth daily. 02/17/21  Yes Megan Salon, MD  diltiazem (TIAZAC) 120 MG 24 hr capsule TAKE 2 CAPSULES DAILY, TO  GOAL BLOOD PRESSURE AROUND 130/80 Patient taking differently: Take 240 mg by mouth daily. TAKE 2 CAPSULES DAILY, TO  GOAL BLOOD PRESSURE AROUND 130/80 02/21/21  Yes Abonza, Maritza, PA-C  docusate sodium  (COLACE) 100 MG capsule Take 100 mg by mouth daily.   Yes [provider]  furosemide (LASIX) 20 MG tablet TAKE 1 TABLET DAILY Patient taking differently: Take 20 mg by mouth daily. 02/06/21  Yes Abonza, Herb Grays, PA-C  Multiple Vitamin (MULTIVITAMIN WITH MINERALS) TABS Take 1 tablet by mouth daily.   Yes [provider]  rosuvastatin (CRESTOR) 5 MG tablet TAKE 1 TABLET AT BEDTIME Patient taking differently: Take 5 mg by mouth daily. 02/21/21  Yes Abonza, Maritza, PA-C  traZODone (DESYREL) 100 MG tablet TAKE 1 TABLET AT BEDTIME ASNEEDED FOR SLEEP Patient taking differently: Take 100 mg by mouth at bedtime as needed for sleep. TAKE 1 TABLET AT BEDTIME ASNEEDED FOR SLEEP 03/27/21  Yes Abonza, Maritza, PA-C  ALPRAZolam (XANAX) 0.5 MG tablet Take 1 tablet (0.5 mg total) by mouth at bedtime as needed for anxiety. Patient not taking: Reported on 05/28/2021 08/18/20   Ronnell Freshwater, NP  azithromycin (ZITHROMAX) 250 MG tablet Take by mouth. Patient not taking: Reported on 05/28/2021 05/20/21   [provider]    Inpatient Medications: Scheduled Meds:  Chlorhexidine Gluconate Cloth  6 each Topical Daily   pantoprazole  40 mg Oral QHS   senna-docusate  1 tablet Oral BID   Continuous Infusions:  sodium chloride 50 mL/hr at 05/29/21 0903   PRN Meds: acetaminophen **OR** acetaminophen (TYLENOL) oral liquid 160 mg/5 mL **OR** acetaminophen, labetalol  Allergies:   No Known Allergies  Social History:   Social History   Socioeconomic History   Marital status: Married    Spouse name: Alicianna Litchford   Number of children: 1   Years of education: Not on file   Highest education level: Not on file  Occupational History   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, [employer]  Tobacco Use   Smoking status: Never   Smokeless tobacco: Never  Vaping Use   Vaping Use: Never used  Substance and Sexual Activity   Alcohol use: Yes    Alcohol/week: 4.0 - 5.0 standard drinks     Types: 4 - 5 Standard drinks or equivalent per week   Drug use: No   Sexual activity: Yes    Partners: Male    Birth control/protection: Surgical  Other Topics Concern   Not on file  Social History Narrative   [place], [place]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   Married   Social Determinants of Health   Financial Resource Strain: Not on file  Food Insecurity: Not on file  Transportation Needs: Not on file  Physical Activity: Not on file  Stress: Not on file  Social Connections: Not on file  Intimate Partner Violence: Not on file    Family History:   Family History  Problem Relation Age of Onset   Diabetes Mother    Hypertension Mother    Lung cancer Mother  Heart disease Mother    Thyroid disease Mother    Depression Mother    Alcoholism Mother    Skin cancer Father    CVA Father    Diabetes Father    Hypertension Father    Cancer Paternal Grandfather        unknown type     ROS:  Please see the history of present illness.  All other ROS reviewed and negative.     Physical Exam/Data:   Vitals:   05/29/21 0430 05/29/21 0500 05/29/21 0502 05/29/21 0600  BP: 117/69  (!) 156/80 129/69  Pulse: 80  87 80  Resp: '19  18 17  '$ Temp:  99.9 F (37.7 C)    TempSrc:  Oral    SpO2: 96%  95% 96%  Weight:        Intake/Output Summary (Last 24 hours) at 05/29/2021 0920 Last data filed at 05/29/2021 0400 Gross per 24 hour  Intake 1449.92 ml  Output --  Net 1449.92 ml   Last 3 Weights 05/28/2021 05/16/2021 02/17/2021  Weight (lbs) 196 lb 6.9 oz 183 lb 189 lb 6.4 oz  Weight (kg) 89.1 kg 83.008 kg 85.911 kg     Body mass index is 34.8 kg/m.  General:  Well nourished, well developed, in no acute distress HEENT: normal Neck: no JVD Vascular: No carotid bruits; Distal pulses 2+ bilaterally Cardiac:  RRR; no murmurs, gallops or rubs Lungs:  CTA b/l, no wheezing, rhonchi or rales  Abd: soft, nontender, no hepatomegaly  Ext: RLE is wrapped, clearly  edematous Musculoskeletal:  No deformities Skin: warm and dry  Neuro:  no obvious gross focal abnormalities noted Psych:  Normal affect   EKG:  The EKG was personally reviewed and demonstrates:    SR 74bpm, 1st degree AVblock 2106m, IVCD/LAD SR 74bpm, 1st degree AVblock, LAD  Telemetry:  Telemetry was personally reviewed and demonstrates:   SR, PACs, one nonsustained WCT 10beats  Relevant CV Studies:  05/28/21: TTE 1. Left ventricular ejection fraction, by estimation, is 55 to 60%. The  left ventricle has normal function. The left ventricle has no regional  wall motion abnormalities. Left ventricular diastolic parameters are  indeterminate.   2. Right ventricular systolic function is normal. The right ventricular  size is normal. There is normal pulmonary artery systolic pressure.   3. Left atrial size was mildly dilated.   4. A small pericardial effusion is present. There is no evidence of  cardiac tamponade.   5. The mitral valve is grossly normal. Moderate mitral valve  regurgitation. No evidence of mitral stenosis.   6. The aortic valve is tricuspid. There is mild calcification of the  aortic valve. Aortic valve regurgitation is not visualized. Aortic valve  sclerosis is present, with no evidence of aortic valve stenosis.   7. The inferior vena cava is normal in size with <50% respiratory  variability, suggesting right atrial pressure of 8 mmHg.   Comparison(s): No significant change from prior study.    10/05/2016: EPS/ablation CONCLUSIONS: 1. Sinus rhythm upon presentation.   2. Successful electrical isolation and anatomical encircling of all four pulmonary veins with radiofrequency current. 3. No inducible arrhythmias following ablation both on and off of dobutamine 4. No early apparent complications.   TTE  06/07/16 - Left ventricle: Systolic function was normal. The estimated   ejection fraction was in the range of 55% to 60%. Wall motion was   normal; there were  no regional wall motion abnormalities. There   was a  reduced contribution of atrial contraction to ventricular   filling, due to increased ventricular diastolic pressure or   atrial contractile dysfunction. Features are consistent with a   pseudonormal left ventricular filling pattern, with concomitant   abnormal relaxation and increased filling pressure (grade 2   diastolic dysfunction). - Aortic valve: No evidence of vegetation. - Mitral valve: Moderately dilated annulus. Structurally normal   valve. There was moderate regurgitation directed centrally. - Left atrium: The atrium was mildly to moderately dilated. No   evidence of thrombus in the atrial cavity or appendage. - Right atrium: No evidence of thrombus in the atrial cavity or   appendage. No evidence of thrombus in the atrial cavity or   appendage. - Atrial septum: No defect or patent foramen ovale was identified.   There was no right-to-left atrial level shunt, following an   increase in RA pressure induced by provocative maneuvers. - Pulmonic valve: No evidence of vegetation. - Pulmonary arteries: Systolic pressure was mildly increased. PA   peak pressure: 40 mm Hg (S). - Pericardium, extracardiac: A trivial pericardial effusion was   identified.   SPECT 06/15/16 Nuclear stress EF: 58%. There was no ST segment deviation noted during stress. The study is normal. This is a low risk study. The left ventricular ejection fraction is hyperdynamic (>65%).   Normal pharmacologic nuclear stress test with no evidence of prior infarct or ischemia.   Laboratory Data:  High Sensitivity Troponin:   Recent Labs  Lab 05/28/21 0531  TROPONINIHS 45*     Chemistry Recent Labs  Lab 05/28/21 0353 05/28/21 0356 05/28/21 0531 05/29/21 0643  NA 139 141  --  139  K 3.2* 3.2*  --  4.3  CL 102 101  --  106  CO2 25  --   --  26  GLUCOSE 93 88  --  103*  BUN 19 20  --  8  CREATININE 1.02* 1.10*  --  0.90  CALCIUM 9.6  --   --   8.5*  MG  --   --  1.7 2.0  GFRNONAA 59*  --   --  >60  ANIONGAP 12  --   --  7    Recent Labs  Lab 05/28/21 0353  PROT 7.0  ALBUMIN 4.0  AST 24  ALT 11  ALKPHOS 60  BILITOT 0.6   Lipids  Recent Labs  Lab 05/29/21 0643  CHOL 126  TRIG 52  HDL 51  LDLCALC 65  CHOLHDL 2.5    Hematology Recent Labs  Lab 05/28/21 0353 05/28/21 0356 05/29/21 0643  WBC 11.1*  --  14.3*  RBC 4.25  --  3.51*  HGB 13.4 14.3 11.2*  HCT 40.7 42.0 32.9*  MCV 95.8  --  93.7  MCH 31.5  --  31.9  MCHC 32.9  --  34.0  RDW 13.4  --  13.7  PLT 350  --  273   Thyroid No results for input(s): TSH, FREET4 in the last 168 hours.  BNPNo results for input(s): BNP, PROBNP in the last 168 hours.  DDimer No results for input(s): DDIMER in the last 168 hours.   Radiology/Studies:  MR ANGIO HEAD WO CONTRAST Result Date: 05/29/2021 CLINICAL DATA:  Initial evaluation for neuro deficit, stroke suspected. Possible seizure. EXAM: MRI HEAD WITHOUT AND WITH CONTRAST MRA HEAD WITHOUT CONTRAST MRA NECK WITHOUT AND WITH CONTRAST TECHNIQUE: Multiplanar, multi-echo pulse sequences of the brain and surrounding structures were acquired without intravenous contrast. Angiographic images of the Circle  of Willis were acquired using MRA technique without intravenous contrast. Angiographic images of the neck were acquired using MRA technique without and with intravenous contrast. Carotid stenosis measurements (when applicable) are obtained utilizing NASCET criteria, using the distal internal carotid diameter as the denominator. CONTRAST:  39m GADAVIST GADOBUTROL 1 MMOL/ML IV SOLN COMPARISON:  Prior CT from 05/28/2021. FINDINGS: MRI HEAD FINDINGS Brain: Cerebral volume within normal limits. Mild scattered patchy T2/FLAIR hyperintensity involving the supratentorial cerebral white matter, most likely related chronic microvascular ischemic disease, minimal for age. There is an approximate 1.2 cm focus of restricted diffusion involving  the cortical gray matter of the posterior right frontal region, consistent with a small acute ischemic infarct (series 5, image 73). Associated susceptibility artifact at this level could reflect petechial hemorrhage versus a prominent vessel (series 14, image 31). No significant mass effect. No other evidence for acute or subacute ischemia. Gray-white matter differentiation otherwise maintained. No other areas of remote cortical infarction. No other acute or chronic intracranial blood products. No mass lesion, midline shift or mass effect. No hydrocephalus or extra-axial fluid collection. Partially empty sella noted. Midline structures intact and normal. No abnormal enhancement or evidence for metastatic disease. Vascular: Major intracranial vascular flow voids are maintained. Skull and upper cervical spine: Craniocervical junction within normal limits. Bone marrow signal intensity normal. No focal marrow replacing lesion. No scalp soft tissue abnormality. Sinuses/Orbits: Globes and orbital soft tissues within normal limits. Chronic left maxillary sinusitis noted. Small retention cyst noted within the right maxillary sinus. Trace bilateral mastoid effusions noted. Visualized nasopharynx unremarkable. Other: None. MRA HEAD FINDINGS Anterior circulation: Visualized distal cervical segments of the internal carotid arteries are widely patent with antegrade flow. Petrous, cavernous, and supraclinoid segments patent without stenosis or other abnormality. A1 segments patent bilaterally. Left A1 hypoplastic, accounting for the diminutive left ICA is compared to the right. Normal anterior communicating artery complex. Both ACAs widely patent. Normal in stenosis or occlusion. Normal MCA bifurcations. No proximal MCA branch occlusion. Distal MCA branches perfused and symmetric. Posterior circulation: Visualized V4 segments widely patent. Left vertebral artery dominant. Left PICA patent at its origin. Right PICA origin not  seen. Basilar patent to its distal aspect without stenosis. Superior cerebral arteries patent bilaterally. Left PCA supplied via the basilar. Right PCA supplied via the basilar as well as a robust right posterior communicating artery. Both PCAs well perfused or distal aspects without stenosis. Anatomic variants: Hypoplastic left A1 segment. No intracranial aneurysm. MRA NECK FINDINGS Aortic arch: Visualized aortic arch normal caliber with normal branch pattern. No stenosis or other abnormality about the origin the great vessels. Right carotid system: Right common and internal carotid arteries mildly tortuous and partially medialized into the retropharyngeal space. No significant atheromatous narrowing or irregularity about the right carotid bulb. No dissection or other acute vascular finding. Left carotid system: Left common and internal carotid arteries patent without stenosis or evidence for dissection. No significant atheromatous stenosis about the left carotid bulb. Vertebral arteries: Both vertebral arteries arise from the subclavian arteries. No proximal subclavian artery stenosis. Left vertebral artery dominant. Vertebral arteries patent without stenosis or dissection. Other: None IMPRESSION: MRI HEAD: 1. 1.2 cm acute ischemic cortical infarct involving the posterior right frontal region. Associated susceptibility artifact at this level could reflect mild petechial hemorrhage versus a prominent vessel. 2. Underlying mild chronic microvascular ischemic disease. No evidence for metastatic disease. 3. Chronic left maxillary sinusitis. MRA HEAD: Normal intracranial MRA. No large vessel occlusion, hemodynamically significant stenosis, or other acute vascular  abnormality. MRA NECK: Normal MRA of the neck. Electronically Signed   By: Jeannine Boga M.D.   On: 05/29/2021 01:43     DG CHEST PORT 1 VIEW Result Date: 05/28/2021 CLINICAL DATA:  71 year old female with history of stroke. EXAM: PORTABLE CHEST 1  VIEW COMPARISON:  Chest x-ray 10/01/2011. FINDINGS: Lung volumes are normal. No consolidative airspace disease. No pleural effusions. No pneumothorax. Small calcified granulomas are noted in the left lung base. No other pulmonary nodule or mass noted. Pulmonary vasculature and the cardiomediastinal silhouette are within normal limits. IMPRESSION: No radiographic evidence of acute cardiopulmonary disease. Electronically Signed   By: Vinnie Langton M.D.   On: 05/28/2021 05:33   EEG adult Result Date: 05/28/2021 Lora Havens, MD     05/28/2021 12:32 PM Patient Name: ANQUANETTE BAHNER MRN: [id number] Epilepsy Attending: Lora Havens Referring Physician/Provider: Janine Ores, NP Date: 05/28/2021 Duration: 21.32 mins Patient history: 72 y.o. female PMHx as noted above with hypertensive emergency and acute onset left face weakness suspicious for stroke. EEG to evaluate for seizure. Level of alertness: Awake AEDs during EEG study: None Technical aspects: This EEG study was done with scalp electrodes positioned according to the 10-20 International system of electrode placement. Electrical activity was acquired at a sampling rate of '500Hz'$  and reviewed with a high frequency filter of '70Hz'$  and a low frequency filter of '1Hz'$ . EEG data were recorded continuously and digitally stored. Description: The posterior dominant rhythm consists of 8 Hz activity of moderate voltage (25-35 uV) seen predominantly in posterior head regions, symmetric and reactive to eye opening and eye closing. Physiologic photic driving was not seen during photic stimulation.  Hyperventilation was not performed.   Of note, study was technically difficult due to significant myogenic artifact IMPRESSION: This technically difficult study is within normal limits. No seizures or epileptiform discharges were seen throughout the recording. If suspicion for interictal activity remains a concern, a repeat study can be considered. Lora Havens   CT  HEAD CODE STROKE WO CONTRAST Result Date: 05/28/2021 CLINICAL DATA:  Code stroke. EXAM: CT HEAD WITHOUT CONTRAST TECHNIQUE: Contiguous axial images were obtained from the base of the skull through the vertex without intravenous contrast. RADIATION DOSE REDUCTION: This exam was performed according to the departmental dose-optimization program which includes automated exposure control, adjustment of the mA and/or kV according to patient size and/or use of iterative reconstruction technique. COMPARISON:  None available. FINDINGS: Brain: Examination mildly degraded by motion. Cerebral volume within normal limits for age. Mild chronic small vessel ischemic disease. No acute intracranial hemorrhage. No convincing large vessel territory infarct. No mass lesion, mass effect or midline shift. No hydrocephalus or extra-axial fluid collection. Vascular: No hyperdense vessel. Scattered vascular calcifications noted within the carotid siphons. Skull: Scalp soft tissues demonstrate no acute finding. Calvarium intact. Sinuses/Orbits: Right gaze noted. Globes and orbital soft tissues otherwise unremarkable. Mild mucosal thickening noted about the maxillary sinuses. Paranasal sinuses are otherwise largely clear. No mastoid or middle ear effusion. Other: None. ASPECTS [facility] Stroke Program Early CT Score) - Ganglionic level infarction (caudate, lentiform nuclei, internal capsule, insula, M1-M3 cortex): 7 - Supraganglionic infarction (M4-M6 cortex): 3 Total score (0-10 with 10 being normal): 10 IMPRESSION: 1. No acute intracranial abnormality. 2. ASPECTS is 10. 3. Mild chronic small vessel ischemic disease. These results were communicated to Dr. Theda Sers at 4:10 am on 05/28/2021 by text page via the [facility] messaging system. Electronically Signed   By: Jeannine Boga M.D.   On: 05/28/2021  04:11     Assessment and Plan:   Paroxysmal AFib S/p PVI ablation 2018  presents with stroke S/p TNK with marked recovery CHA2DSVasc  is now 4 She will need to  resume [place] when deemed safe post TNK/timing per neurology team   Dr. Quentin Ore will see her later today I will make EP follow up   Risk Assessment/Risk Scores:    For questions or updates, please contact [facility] Please consult [email or web address] for contact info under    Signed, Baldwin Jamaica, PA-C  05/29/2021 9:20 AM

## 2021-05-29 NOTE — Progress Notes (Signed)
Education reviewed upon discharge regarding the following: medications, follow-up appointments, referrals, stroke symptoms and prevention, and activation of EMS if stroke symptoms arise. AVS handout reviewed and sent home. Belongings sent home. Understanding of information taught at discharge confirmed through teach-back by both patient and husband.  ?

## 2021-05-29 NOTE — Discharge Summary (Addendum)
Stroke Discharge Summary  Patient ID: Savannah Hill   MRN: [id number]      DOB: 1951-03-01  Date of Admission: 05/28/2021 Date of Discharge: 05/29/2021  Attending Physician:  Rosalin Hawking MD Consultant(s):    cardiology  Patient's PCP:  Lorrene Reid, PA-C  DISCHARGE DIAGNOSIS:  Principal Problem:   Stroke - right MCA cortical infarct likely due to embolic from PAF not on [facility]  Secondary diagnosis Seizure PAF HTN HLD obesity   Allergies as of 05/29/2021   No Known Allergies      Medication List     STOP taking these medications    azithromycin 250 MG tablet Commonly known as: ZITHROMAX       TAKE these medications    ALPRAZolam 0.5 MG tablet Commonly known as: XANAX Take 1 tablet (0.5 mg total) by mouth at bedtime as needed for anxiety.   apixaban 5 MG Tabs tablet Commonly known as: ELIQUIS Take 1 tablet (5 mg total) by mouth 2 (two) times daily.   benzonatate 100 MG capsule Commonly known as: TESSALON Take 100 mg by mouth 3 (three) times daily as needed for cough.   citalopram 20 MG tablet Commonly known as: CELEXA Take 1 tablet (20 mg total) by mouth daily.   diltiazem 120 MG 24 hr capsule Commonly known as: TIAZAC TAKE 2 CAPSULES DAILY, TO  GOAL BLOOD PRESSURE AROUND 130/80 What changed:  how much to take how to take this when to take this   docusate sodium 100 MG capsule Commonly known as: COLACE Take 100 mg by mouth daily.   furosemide 20 MG tablet Commonly known as: LASIX TAKE 1 TABLET DAILY   levETIRAcetam 500 MG tablet Commonly known as: KEPPRA Take 1 tablet (500 mg total) by mouth 2 (two) times daily.   multivitamin with minerals Tabs tablet Take 1 tablet by mouth daily.   rosuvastatin 5 MG tablet Commonly known as: CRESTOR TAKE 1 TABLET AT BEDTIME What changed: when to take this   traZODone 100 MG tablet Commonly known as: DESYREL TAKE 1 TABLET AT BEDTIME ASNEEDED FOR SLEEP What changed: See the new  instructions.   Vitamin D3 125 MCG (5000 UT) Caps Take 1 capsule by mouth daily.        LABORATORY STUDIES CBC    Component Value Date/Time   WBC 14.3 (H) 05/29/2021 0643   RBC 3.51 (L) 05/29/2021 0643   HGB 11.2 (L) 05/29/2021 0643   HGB 12.8 01/31/2021 1013   HGB 12.7 03/02/2013 1530   HCT 32.9 (L) 05/29/2021 0643   HCT 39.2 01/31/2021 1013   PLT 273 05/29/2021 0643   PLT 353 01/31/2021 1013   MCV 93.7 05/29/2021 0643   MCV 93 01/31/2021 1013   MCH 31.9 05/29/2021 0643   MCHC 34.0 05/29/2021 0643   RDW 13.7 05/29/2021 0643   RDW 13.1 01/31/2021 1013   LYMPHSABS 4.3 (H) 05/28/2021 0353   LYMPHSABS 1.7 01/31/2021 1013   MONOABS 0.8 05/28/2021 0353   EOSABS 0.3 05/28/2021 0353   EOSABS 0.2 01/31/2021 1013   BASOSABS 0.0 05/28/2021 0353   BASOSABS 0.0 01/31/2021 1013   CMP    Component Value Date/Time   NA 139 05/29/2021 0643   NA 141 01/31/2021 1013   K 4.3 05/29/2021 0643   CL 106 05/29/2021 0643   CO2 26 05/29/2021 0643   GLUCOSE 103 (H) 05/29/2021 0643   BUN 8 05/29/2021 0643   BUN 11 01/31/2021 1013   CREATININE 0.90 05/29/2021  8786   CREATININE 0.96 12/19/2015 1524   CALCIUM 8.5 (L) 05/29/2021 0643   PROT 7.0 05/28/2021 0353   PROT 7.1 01/31/2021 1013   ALBUMIN 4.0 05/28/2021 0353   ALBUMIN 4.5 01/31/2021 1013   AST 24 05/28/2021 0353   ALT 11 05/28/2021 0353   ALKPHOS 60 05/28/2021 0353   BILITOT 0.6 05/28/2021 0353   BILITOT 0.6 01/31/2021 1013   GFRNONAA >60 05/29/2021 0643   GFRAA 71 01/21/2020 0954   COAGS Lab Results  Component Value Date   INR 0.9 05/28/2021   INR 1.0 08/18/2020   INR 1.0 04/30/2016   Lipid Panel    Component Value Date/Time   CHOL 126 05/29/2021 0643   CHOL 158 01/31/2021 1013   TRIG 52 05/29/2021 0643   HDL 51 05/29/2021 0643   HDL 65 01/31/2021 1013   CHOLHDL 2.5 05/29/2021 0643   VLDL 10 05/29/2021 0643   LDLCALC 65 05/29/2021 0643   LDLCALC 77 01/31/2021 1013   HgbA1C  Lab Results  Component Value  Date   HGBA1C 5.9 (H) 01/31/2021   Urinalysis    Component Value Date/Time   COLORURINE YELLOW 05/28/2021 1432   APPEARANCEUR CLEAR 05/28/2021 1432   LABSPEC 1.011 05/28/2021 1432   PHURINE 7.0 05/28/2021 1432   GLUCOSEU NEGATIVE 05/28/2021 1432   HGBUR NEGATIVE 05/28/2021 1432   BILIRUBINUR NEGATIVE 05/28/2021 1432   BILIRUBINUR N 06/03/2015 1257   KETONESUR NEGATIVE 05/28/2021 1432   PROTEINUR NEGATIVE 05/28/2021 1432   UROBILINOGEN negative 06/03/2015 1257   NITRITE NEGATIVE 05/28/2021 1432   LEUKOCYTESUR NEGATIVE 05/28/2021 1432   Urine Drug Screen     Component Value Date/Time   LABOPIA NONE DETECTED 05/28/2021 1432   COCAINSCRNUR NONE DETECTED 05/28/2021 1432   LABBENZ NONE DETECTED 05/28/2021 1432   AMPHETMU NONE DETECTED 05/28/2021 1432   THCU NONE DETECTED 05/28/2021 1432   LABBARB NONE DETECTED 05/28/2021 1432    Alcohol Level    Component Value Date/Time   ETH 16 (H) 05/28/2021 0531     SIGNIFICANT DIAGNOSTIC STUDIES MR ANGIO HEAD WO CONTRAST  Result Date: 05/29/2021 CLINICAL DATA:  Initial evaluation for neuro deficit, stroke suspected. Possible seizure. EXAM: MRI HEAD WITHOUT AND WITH CONTRAST MRA HEAD WITHOUT CONTRAST MRA NECK WITHOUT AND WITH CONTRAST TECHNIQUE: Multiplanar, multi-echo pulse sequences of the brain and surrounding structures were acquired without intravenous contrast. Angiographic images of the Circle of Willis were acquired using MRA technique without intravenous contrast. Angiographic images of the neck were acquired using MRA technique without and with intravenous contrast. Carotid stenosis measurements (when applicable) are obtained utilizing NASCET criteria, using the distal internal carotid diameter as the denominator. CONTRAST:  24m GADAVIST GADOBUTROL 1 MMOL/ML IV SOLN COMPARISON:  Prior CT from 05/28/2021. FINDINGS: MRI HEAD FINDINGS Brain: Cerebral volume within normal limits. Mild scattered patchy T2/FLAIR hyperintensity involving the  supratentorial cerebral white matter, most likely related chronic microvascular ischemic disease, minimal for age. There is an approximate 1.2 cm focus of restricted diffusion involving the cortical gray matter of the posterior right frontal region, consistent with a small acute ischemic infarct (series 5, image 73). Associated susceptibility artifact at this level could reflect petechial hemorrhage versus a prominent vessel (series 14, image 31). No significant mass effect. No other evidence for acute or subacute ischemia. Gray-white matter differentiation otherwise maintained. No other areas of remote cortical infarction. No other acute or chronic intracranial blood products. No mass lesion, midline shift or mass effect. No hydrocephalus or extra-axial fluid collection. Partially empty sella  noted. Midline structures intact and normal. No abnormal enhancement or evidence for metastatic disease. Vascular: Major intracranial vascular flow voids are maintained. Skull and upper cervical spine: Craniocervical junction within normal limits. Bone marrow signal intensity normal. No focal marrow replacing lesion. No scalp soft tissue abnormality. Sinuses/Orbits: Globes and orbital soft tissues within normal limits. Chronic left maxillary sinusitis noted. Small retention cyst noted within the right maxillary sinus. Trace bilateral mastoid effusions noted. Visualized nasopharynx unremarkable. Other: None. MRA HEAD FINDINGS Anterior circulation: Visualized distal cervical segments of the internal carotid arteries are widely patent with antegrade flow. Petrous, cavernous, and supraclinoid segments patent without stenosis or other abnormality. A1 segments patent bilaterally. Left A1 hypoplastic, accounting for the diminutive left ICA is compared to the right. Normal anterior communicating artery complex. Both ACAs widely patent. Normal in stenosis or occlusion. Normal MCA bifurcations. No proximal MCA branch occlusion. Distal  MCA branches perfused and symmetric. Posterior circulation: Visualized V4 segments widely patent. Left vertebral artery dominant. Left PICA patent at its origin. Right PICA origin not seen. Basilar patent to its distal aspect without stenosis. Superior cerebral arteries patent bilaterally. Left PCA supplied via the basilar. Right PCA supplied via the basilar as well as a robust right posterior communicating artery. Both PCAs well perfused or distal aspects without stenosis. Anatomic variants: Hypoplastic left A1 segment. No intracranial aneurysm. MRA NECK FINDINGS Aortic arch: Visualized aortic arch normal caliber with normal branch pattern. No stenosis or other abnormality about the origin the great vessels. Right carotid system: Right common and internal carotid arteries mildly tortuous and partially medialized into the retropharyngeal space. No significant atheromatous narrowing or irregularity about the right carotid bulb. No dissection or other acute vascular finding. Left carotid system: Left common and internal carotid arteries patent without stenosis or evidence for dissection. No significant atheromatous stenosis about the left carotid bulb. Vertebral arteries: Both vertebral arteries arise from the subclavian arteries. No proximal subclavian artery stenosis. Left vertebral artery dominant. Vertebral arteries patent without stenosis or dissection. Other: None IMPRESSION: MRI HEAD: 1. 1.2 cm acute ischemic cortical infarct involving the posterior right frontal region. Associated susceptibility artifact at this level could reflect mild petechial hemorrhage versus a prominent vessel. 2. Underlying mild chronic microvascular ischemic disease. No evidence for metastatic disease. 3. Chronic left maxillary sinusitis. MRA HEAD: Normal intracranial MRA. No large vessel occlusion, hemodynamically significant stenosis, or other acute vascular abnormality. MRA NECK: Normal MRA of the neck. Electronically Signed   By:  Jeannine Boga M.D.   On: 05/29/2021 01:43   MR ANGIO NECK W WO CONTRAST  Result Date: 05/29/2021 CLINICAL DATA:  Initial evaluation for neuro deficit, stroke suspected. Possible seizure. EXAM: MRI HEAD WITHOUT AND WITH CONTRAST MRA HEAD WITHOUT CONTRAST MRA NECK WITHOUT AND WITH CONTRAST TECHNIQUE: Multiplanar, multi-echo pulse sequences of the brain and surrounding structures were acquired without intravenous contrast. Angiographic images of the Circle of Willis were acquired using MRA technique without intravenous contrast. Angiographic images of the neck were acquired using MRA technique without and with intravenous contrast. Carotid stenosis measurements (when applicable) are obtained utilizing NASCET criteria, using the distal internal carotid diameter as the denominator. CONTRAST:  32m GADAVIST GADOBUTROL 1 MMOL/ML IV SOLN COMPARISON:  Prior CT from 05/28/2021. FINDINGS: MRI HEAD FINDINGS Brain: Cerebral volume within normal limits. Mild scattered patchy T2/FLAIR hyperintensity involving the supratentorial cerebral white matter, most likely related chronic microvascular ischemic disease, minimal for age. There is an approximate 1.2 cm focus of restricted diffusion involving the cortical gray matter  of the posterior right frontal region, consistent with a small acute ischemic infarct (series 5, image 73). Associated susceptibility artifact at this level could reflect petechial hemorrhage versus a prominent vessel (series 14, image 31). No significant mass effect. No other evidence for acute or subacute ischemia. Gray-white matter differentiation otherwise maintained. No other areas of remote cortical infarction. No other acute or chronic intracranial blood products. No mass lesion, midline shift or mass effect. No hydrocephalus or extra-axial fluid collection. Partially empty sella noted. Midline structures intact and normal. No abnormal enhancement or evidence for metastatic disease. Vascular: Major  intracranial vascular flow voids are maintained. Skull and upper cervical spine: Craniocervical junction within normal limits. Bone marrow signal intensity normal. No focal marrow replacing lesion. No scalp soft tissue abnormality. Sinuses/Orbits: Globes and orbital soft tissues within normal limits. Chronic left maxillary sinusitis noted. Small retention cyst noted within the right maxillary sinus. Trace bilateral mastoid effusions noted. Visualized nasopharynx unremarkable. Other: None. MRA HEAD FINDINGS Anterior circulation: Visualized distal cervical segments of the internal carotid arteries are widely patent with antegrade flow. Petrous, cavernous, and supraclinoid segments patent without stenosis or other abnormality. A1 segments patent bilaterally. Left A1 hypoplastic, accounting for the diminutive left ICA is compared to the right. Normal anterior communicating artery complex. Both ACAs widely patent. Normal in stenosis or occlusion. Normal MCA bifurcations. No proximal MCA branch occlusion. Distal MCA branches perfused and symmetric. Posterior circulation: Visualized V4 segments widely patent. Left vertebral artery dominant. Left PICA patent at its origin. Right PICA origin not seen. Basilar patent to its distal aspect without stenosis. Superior cerebral arteries patent bilaterally. Left PCA supplied via the basilar. Right PCA supplied via the basilar as well as a robust right posterior communicating artery. Both PCAs well perfused or distal aspects without stenosis. Anatomic variants: Hypoplastic left A1 segment. No intracranial aneurysm. MRA NECK FINDINGS Aortic arch: Visualized aortic arch normal caliber with normal branch pattern. No stenosis or other abnormality about the origin the great vessels. Right carotid system: Right common and internal carotid arteries mildly tortuous and partially medialized into the retropharyngeal space. No significant atheromatous narrowing or irregularity about the right  carotid bulb. No dissection or other acute vascular finding. Left carotid system: Left common and internal carotid arteries patent without stenosis or evidence for dissection. No significant atheromatous stenosis about the left carotid bulb. Vertebral arteries: Both vertebral arteries arise from the subclavian arteries. No proximal subclavian artery stenosis. Left vertebral artery dominant. Vertebral arteries patent without stenosis or dissection. Other: None IMPRESSION: MRI HEAD: 1. 1.2 cm acute ischemic cortical infarct involving the posterior right frontal region. Associated susceptibility artifact at this level could reflect mild petechial hemorrhage versus a prominent vessel. 2. Underlying mild chronic microvascular ischemic disease. No evidence for metastatic disease. 3. Chronic left maxillary sinusitis. MRA HEAD: Normal intracranial MRA. No large vessel occlusion, hemodynamically significant stenosis, or other acute vascular abnormality. MRA NECK: Normal MRA of the neck. Electronically Signed   By: Jeannine Boga M.D.   On: 05/29/2021 01:43   MR BRAIN W WO CONTRAST  Result Date: 05/29/2021 CLINICAL DATA:  Initial evaluation for neuro deficit, stroke suspected. Possible seizure. EXAM: MRI HEAD WITHOUT AND WITH CONTRAST MRA HEAD WITHOUT CONTRAST MRA NECK WITHOUT AND WITH CONTRAST TECHNIQUE: Multiplanar, multi-echo pulse sequences of the brain and surrounding structures were acquired without intravenous contrast. Angiographic images of the Circle of Willis were acquired using MRA technique without intravenous contrast. Angiographic images of the neck were acquired using MRA technique without and  with intravenous contrast. Carotid stenosis measurements (when applicable) are obtained utilizing NASCET criteria, using the distal internal carotid diameter as the denominator. CONTRAST:  49m GADAVIST GADOBUTROL 1 MMOL/ML IV SOLN COMPARISON:  Prior CT from 05/28/2021. FINDINGS: MRI HEAD FINDINGS Brain:  Cerebral volume within normal limits. Mild scattered patchy T2/FLAIR hyperintensity involving the supratentorial cerebral white matter, most likely related chronic microvascular ischemic disease, minimal for age. There is an approximate 1.2 cm focus of restricted diffusion involving the cortical gray matter of the posterior right frontal region, consistent with a small acute ischemic infarct (series 5, image 73). Associated susceptibility artifact at this level could reflect petechial hemorrhage versus a prominent vessel (series 14, image 31). No significant mass effect. No other evidence for acute or subacute ischemia. Gray-white matter differentiation otherwise maintained. No other areas of remote cortical infarction. No other acute or chronic intracranial blood products. No mass lesion, midline shift or mass effect. No hydrocephalus or extra-axial fluid collection. Partially empty sella noted. Midline structures intact and normal. No abnormal enhancement or evidence for metastatic disease. Vascular: Major intracranial vascular flow voids are maintained. Skull and upper cervical spine: Craniocervical junction within normal limits. Bone marrow signal intensity normal. No focal marrow replacing lesion. No scalp soft tissue abnormality. Sinuses/Orbits: Globes and orbital soft tissues within normal limits. Chronic left maxillary sinusitis noted. Small retention cyst noted within the right maxillary sinus. Trace bilateral mastoid effusions noted. Visualized nasopharynx unremarkable. Other: None. MRA HEAD FINDINGS Anterior circulation: Visualized distal cervical segments of the internal carotid arteries are widely patent with antegrade flow. Petrous, cavernous, and supraclinoid segments patent without stenosis or other abnormality. A1 segments patent bilaterally. Left A1 hypoplastic, accounting for the diminutive left ICA is compared to the right. Normal anterior communicating artery complex. Both ACAs widely patent.  Normal in stenosis or occlusion. Normal MCA bifurcations. No proximal MCA branch occlusion. Distal MCA branches perfused and symmetric. Posterior circulation: Visualized V4 segments widely patent. Left vertebral artery dominant. Left PICA patent at its origin. Right PICA origin not seen. Basilar patent to its distal aspect without stenosis. Superior cerebral arteries patent bilaterally. Left PCA supplied via the basilar. Right PCA supplied via the basilar as well as a robust right posterior communicating artery. Both PCAs well perfused or distal aspects without stenosis. Anatomic variants: Hypoplastic left A1 segment. No intracranial aneurysm. MRA NECK FINDINGS Aortic arch: Visualized aortic arch normal caliber with normal branch pattern. No stenosis or other abnormality about the origin the great vessels. Right carotid system: Right common and internal carotid arteries mildly tortuous and partially medialized into the retropharyngeal space. No significant atheromatous narrowing or irregularity about the right carotid bulb. No dissection or other acute vascular finding. Left carotid system: Left common and internal carotid arteries patent without stenosis or evidence for dissection. No significant atheromatous stenosis about the left carotid bulb. Vertebral arteries: Both vertebral arteries arise from the subclavian arteries. No proximal subclavian artery stenosis. Left vertebral artery dominant. Vertebral arteries patent without stenosis or dissection. Other: None IMPRESSION: MRI HEAD: 1. 1.2 cm acute ischemic cortical infarct involving the posterior right frontal region. Associated susceptibility artifact at this level could reflect mild petechial hemorrhage versus a prominent vessel. 2. Underlying mild chronic microvascular ischemic disease. No evidence for metastatic disease. 3. Chronic left maxillary sinusitis. MRA HEAD: Normal intracranial MRA. No large vessel occlusion, hemodynamically significant stenosis,  or other acute vascular abnormality. MRA NECK: Normal MRA of the neck. Electronically Signed   By: BJeannine BogaM.D.   On: 05/29/2021  01:43   DG CHEST PORT 1 VIEW  Result Date: 05/28/2021 CLINICAL DATA:  71 year old female with history of stroke. EXAM: PORTABLE CHEST 1 VIEW COMPARISON:  Chest x-ray 10/01/2011. FINDINGS: Lung volumes are normal. No consolidative airspace disease. No pleural effusions. No pneumothorax. Small calcified granulomas are noted in the left lung base. No other pulmonary nodule or mass noted. Pulmonary vasculature and the cardiomediastinal silhouette are within normal limits. IMPRESSION: No radiographic evidence of acute cardiopulmonary disease. Electronically Signed   By: Vinnie Langton M.D.   On: 05/28/2021 05:33   EEG adult  Result Date: 05/28/2021 Lora Havens, MD     05/28/2021 12:32 PM Patient Name: Savannah Hill MRN: [id number] Epilepsy Attending: Lora Havens Referring Physician/Provider: Janine Ores, NP Date: 05/28/2021 Duration: 21.32 mins Patient history: 71 y.o. female PMHx as noted above with hypertensive emergency and acute onset left face weakness suspicious for stroke. EEG to evaluate for seizure. Level of alertness: Awake AEDs during EEG study: None Technical aspects: This EEG study was done with scalp electrodes positioned according to the 10-20 International system of electrode placement. Electrical activity was acquired at a sampling rate of '500Hz'$  and reviewed with a high frequency filter of '70Hz'$  and a low frequency filter of '1Hz'$ . EEG data were recorded continuously and digitally stored. Description: The posterior dominant rhythm consists of 8 Hz activity of moderate voltage (25-35 uV) seen predominantly in posterior head regions, symmetric and reactive to eye opening and eye closing. Physiologic photic driving was not seen during photic stimulation.  Hyperventilation was not performed.   Of note, study was technically difficult due to  significant myogenic artifact IMPRESSION: This technically difficult study is within normal limits. No seizures or epileptiform discharges were seen throughout the recording. If suspicion for interictal activity remains a concern, a repeat study can be considered. Lora Havens   ECHOCARDIOGRAM COMPLETE  Result Date: 05/28/2021    ECHOCARDIOGRAM REPORT   Patient Name:   Savannah Hill Date of Exam: 05/28/2021 Medical Rec #:  [id number]          Height:       63.0 in Accession #:    [id number]         Weight:       196.4 lb Date of Birth:  1950-12-10         BSA:          1.919 m Patient Age:    20 years           BP:           142/98 mmHg Patient Gender: F                  HR:           77 bpm. Exam Location:  Inpatient Procedure: 2D Echo Indications:    Stroke  History:        Patient has prior history of Echocardiogram examinations, most                 recent 12/22/2020. Mitral Valve Disease, Arrythmias:Atrial                 Fibrillation, Signs/Symptoms:Shortness of Breath; Risk                 Factors:Hypertension and Dyslipidemia.  Sonographer:    Arlyss Gandy Referring Phys: [id number] [place]  1. Left ventricular ejection fraction, by estimation, is 55 to 60%. The left ventricle has normal  function. The left ventricle has no regional wall motion abnormalities. Left ventricular diastolic parameters are indeterminate.  2. Right ventricular systolic function is normal. The right ventricular size is normal. There is normal pulmonary artery systolic pressure.  3. Left atrial size was mildly dilated.  4. A small pericardial effusion is present. There is no evidence of cardiac tamponade.  5. The mitral valve is grossly normal. Moderate mitral valve regurgitation. No evidence of mitral stenosis.  6. The aortic valve is tricuspid. There is mild calcification of the aortic valve. Aortic valve regurgitation is not visualized. Aortic valve sclerosis is present, with no evidence of aortic  valve stenosis.  7. The inferior vena cava is normal in size with <50% respiratory variability, suggesting right atrial pressure of 8 mmHg. Comparison(s): No significant change from prior study. Conclusion(s)/Recommendation(s): Otherwise normal echocardiogram, with minor abnormalities described in the report. FINDINGS  Left Ventricle: Left ventricular ejection fraction, by estimation, is 55 to 60%. The left ventricle has normal function. The left ventricle has no regional wall motion abnormalities. The left ventricular internal cavity size was normal in size. There is  no left ventricular hypertrophy. Left ventricular diastolic parameters are indeterminate. Right Ventricle: The right ventricular size is normal. No increase in right ventricular wall thickness. Right ventricular systolic function is normal. There is normal pulmonary artery systolic pressure. The tricuspid regurgitant velocity is 1.86 m/s, and  with an assumed right atrial pressure of 8 mmHg, the estimated right ventricular systolic pressure is 40.9 mmHg. Left Atrium: Left atrial size was mildly dilated. Right Atrium: Right atrial size was normal in size. Pericardium: A small pericardial effusion is present. There is no evidence of cardiac tamponade. Mitral Valve: The mitral valve is grossly normal. Moderate mitral valve regurgitation. No evidence of mitral valve stenosis. Tricuspid Valve: The tricuspid valve is normal in structure. Tricuspid valve regurgitation is trivial. No evidence of tricuspid stenosis. Aortic Valve: The aortic valve is tricuspid. There is mild calcification of the aortic valve. Aortic valve regurgitation is not visualized. Aortic valve sclerosis is present, with no evidence of aortic valve stenosis. Aortic valve mean gradient measures 3.0 mmHg. Aortic valve peak gradient measures 5.9 mmHg. Aortic valve area, by VTI measures 2.16 cm. Pulmonic Valve: The pulmonic valve was not well visualized. Pulmonic valve regurgitation is not  visualized. No evidence of pulmonic stenosis. Aorta: The aortic root, ascending aorta, aortic arch and descending aorta are all structurally normal, with no evidence of dilitation or obstruction. Venous: The inferior vena cava is normal in size with less than 50% respiratory variability, suggesting right atrial pressure of 8 mmHg. IAS/Shunts: The atrial septum is grossly normal.  LEFT VENTRICLE PLAX 2D LVIDd:         4.40 cm   Diastology LVIDs:         3.00 cm   LV e' medial:    7.62 cm/s LV PW:         1.00 cm   LV E/e' medial:  16.3 LV IVS:        1.00 cm   LV e' lateral:   10.70 cm/s LVOT diam:     2.00 cm   LV E/e' lateral: 11.6 LV SV:         54 LV SV Index:   28 LVOT Area:     3.14 cm  RIGHT VENTRICLE             IVC RV Basal diam:  3.70 cm     IVC diam: 1.90 cm  RV Mid diam:    2.60 cm RV S prime:     10.30 cm/s TAPSE (M-mode): 2.2 cm LEFT ATRIUM             Index        RIGHT ATRIUM           Index LA Vol (A2C):   63.9 ml 33.30 ml/m  RA Area:     18.60 cm LA Vol (A4C):   37.7 ml 19.65 ml/m  RA Volume:   46.30 ml  24.13 ml/m LA Biplane Vol: 52.8 ml 27.52 ml/m  AORTIC VALVE AV Area (Vmax):    2.40 cm AV Area (Vmean):   2.24 cm AV Area (VTI):     2.16 cm AV Vmax:           121.00 cm/s AV Vmean:          84.300 cm/s AV VTI:            0.249 m AV Peak Grad:      5.9 mmHg AV Mean Grad:      3.0 mmHg LVOT Vmax:         92.60 cm/s LVOT Vmean:        60.100 cm/s LVOT VTI:          0.171 m LVOT/AV VTI ratio: 0.69  AORTA Ao Root diam: 2.40 cm Ao Asc diam:  3.30 cm MITRAL VALVE                TRICUSPID VALVE MV Area (PHT): 3.68 cm     TR Peak grad:   13.8 mmHg MV Decel Time: 206 msec     TR Vmax:        186.00 cm/s MR Peak grad: 114.1 mmHg MR Mean grad: 76.0 mmHg     SHUNTS MR Vmax:      534.00 cm/s   Systemic VTI:  0.17 m MR Vmean:     412.0 cm/s    Systemic Diam: 2.00 cm MV E velocity: 124.00 cm/s MV A velocity: 56.90 cm/s MV E/A ratio:  2.18 Buford Dresser MD Electronically signed by Buford Dresser MD Signature Date/Time: 05/28/2021/4:50:38 PM    Final    CT HEAD CODE STROKE WO CONTRAST  Result Date: 05/28/2021 CLINICAL DATA:  Code stroke. EXAM: CT HEAD WITHOUT CONTRAST TECHNIQUE: Contiguous axial images were obtained from the base of the skull through the vertex without intravenous contrast. RADIATION DOSE REDUCTION: This exam was performed according to the departmental dose-optimization program which includes automated exposure control, adjustment of the mA and/or kV according to patient size and/or use of iterative reconstruction technique. COMPARISON:  None available. FINDINGS: Brain: Examination mildly degraded by motion. Cerebral volume within normal limits for age. Mild chronic small vessel ischemic disease. No acute intracranial hemorrhage. No convincing large vessel territory infarct. No mass lesion, mass effect or midline shift. No hydrocephalus or extra-axial fluid collection. Vascular: No hyperdense vessel. Scattered vascular calcifications noted within the carotid siphons. Skull: Scalp soft tissues demonstrate no acute finding. Calvarium intact. Sinuses/Orbits: Right gaze noted. Globes and orbital soft tissues otherwise unremarkable. Mild mucosal thickening noted about the maxillary sinuses. Paranasal sinuses are otherwise largely clear. No mastoid or middle ear effusion. Other: None. ASPECTS [facility] Stroke Program Early CT Score) - Ganglionic level infarction (caudate, lentiform nuclei, internal capsule, insula, M1-M3 cortex): 7 - Supraganglionic infarction (M4-M6 cortex): 3 Total score (0-10 with 10 being normal): 10 IMPRESSION: 1. No acute intracranial abnormality. 2. ASPECTS is 10. 3. Mild  chronic small vessel ischemic disease. These results were communicated to Dr. Theda Sers at 4:10 am on 05/28/2021 by text page via the [facility] messaging system. Electronically Signed   By: Jeannine Boga M.D.   On: 05/28/2021 04:11      HISTORY OF PRESENT ILLNESS Savannah Hill is  a 71 y.o. female with history of atrial fibrillation, anemia, cervical cancer, dyspnea, heart valve disorder, HLD, HTN, joint pain presenting with left side facial droop and right side weakness starting at 0145 on 3/12.  Patient states that she woke her husband up with sudden jerking motions in the bed, she does not remember this.  She remembers him turning the light on and EMS arriving.  When she awoke her husband stated that she had a left facial droop and left-sided weakness, but she does not remember this.  He also showed her this morning that she had bitten her tongue, which she did not realize.  She was previously on Eliquis after her ablation until September 2022. Resuming Eliquis per EP.     HOSPITAL COURSE   Stroke- right MCA cortical small infarct secondary to PAF s/p ablation and eliquis d/c'd in 11/2020 Code Stroke CT head No acute abnormality. ASPECTS 10.    MRI 1.2 cm acute ischemic cortical infarct involving the posterior right frontal region.   MRA normal intracranial head and neck MRA 2D Echo EF 55-60% LDL 65 HgbA1c pending VTE prophylaxis - SCDs No antithrombotic prior to admission, now on eliquis  Therapy recommendations:  none Disposition:  home   Seizure Possible seizure activity with tongue biting, no recollection, restless vs. Shaking at home EEG-significant myogenic artifact, technically within normal limits start Keppra '500mg'$   No driving until Sz free for 6 months and under physician care - pt and husband aware Follow up at [facility]   Paroxysmal Atrial Fibrillation s/p ablation in 2018 Eliquis d/c'ed in 11/2020 Resume eliquis after discussion with EP   Hypertension Home meds:  None Stable Long-term BP goal normotensive   Hyperlipidemia Home meds:  Rosuvastatin, resumed in hospital LDL 65 goal < 70 Continue crestor '5mg'$ , no high intensity statin given LDL at goal Continue statin at discharge   COVID COVID PCR positive Asymptomatic now Airborne  isolation  Other Stroke Risk Factors Advanced Age >/= 65  ETOH use, alcohol level 16, advised to drink no more than 1 drink(s) a day Obesity, Body mass index is 34.8 kg/m., BMI >/= 30 associated with increased stroke risk, recommend weight loss, diet and exercise as appropriate    DISCHARGE EXAM Blood pressure 140/84, pulse 76, temperature 99.9 F (37.7 C), temperature source Oral, resp. rate 16, weight 89.1 kg, last menstrual period 03/19/1984, SpO2 97 %.  Physical Exam  Constitutional: Appears well-developed and well-nourished.  Cardiovascular: Normal rate, rhythm appears irregular on monitor Respiratory: Effort normal, non-labored breathing   Neuro: Mental Status: Patient is awake, alert, oriented to person, place, month, year, and situation. Patient is able to give a clear and coherent history. No signs of aphasia or neglect Cranial Nerves: II: Visual Fields are full. Pupils are equal, round, and reactive to light.   III,IV, VI: EOMI without ptosis or diploplia.  V: Facial sensation is symmetric to temperature VII: Facial movement is symmetric resting and smiling VIII: Hearing is intact to voice X: Palate elevates symmetrically XI: Shoulder shrug is symmetric. XII: Tongue protrudes midline without atrophy or fasciculations.  Motor: Tone is normal. Bulk is normal. 5/5 strength was present in all four extremities.  Sensory:  Sensation is symmetric to light touch and temperature in the arms and legs. No extinction to DSS present.  Cerebellar: FNF and HKS are intact bilaterally    DISCHARGE PLAN Disposition:  Home with outpatient OT Eliquis (apixaban) daily '5mg'$  BID for secondary stroke prevention  Ongoing stroke risk factor control by Primary Care Physician at time of discharge Follow-up PCP Lorrene Reid, PA-C in 2 weeks. Follow-up in [facility] in 8 weeks, office to schedule an appointment.  Follow- up with Cardiology Dr. Curt Bears as  soon as possible  35 minutes were spent preparing discharge.  Patient seen and examined by NP/APP with MD. MD to update note as needed.   Janine Ores, DNP, FNP-BC [facility] Pager: [phone]  ATTENDING NOTE: I reviewed above note and agree with the assessment and plan. Pt was seen and examined.   71 year old female with history of A-fib status post ablation in 2018 off Eliquis in 11/2020, hypertension, hyperlipidemia, obesity admitted for left facial droop, left-sided weakness.  Reported by EMS A-fib RVR on their EKG.  Per husband, patient was found in middle of the night restless versus shaking versus shivering in bed at the time of stroke onset.  Patient also has tongue biting on the left lateral tongue, cannot recollect the event.  CT no acute epilepsy.  Status post TNK.  EF 55 to 60%.  MRA head and neck unremarkable.  MRI showed right MCA cortical small infarct.  UDS negative.  LDL 65, A1c pending, creatinine 1.10.  EEG no seizure.  Also found to have COVID PCR positive.  Currently patient asymptomatic, neuro exam normal no focal deficit.  Etiology for patient stroke likely due to PAF not on AC.  Patient was on Eliquis until 11/2020.  Given small infarct on MRI, restarted Eliquis today on discharge.  Patient also had episode of tongue biting, not remember the event, restless versus shaking, concerning for seizure activity.  Put on Keppra 500 twice daily, no driving until seizure-free for 6 months and under physician care.  Continue Crestor on discharge. Follow up with [facility] in 4 weeks  For detailed assessment and plan, please refer to above as I have made changes wherever appropriate.   Rosalin Hawking, MD PhD Stroke Neurology 05/29/2021 7:22 PM

## 2021-05-30 ENCOUNTER — Telehealth: Payer: Self-pay | Admitting: Physician Assistant

## 2021-05-30 NOTE — TOC CAGE-AID Note (Signed)
Transition of Care (TOC) - CAGE-AID Screening ? ? ?Patient Details  ?Name: Savannah Hill ?MRN: [id number] ?Date of Birth: [birth year] ? ?Transition of Care (TOC) CM/SW Contact:    ?Rithik Odea C Tarpley-Carter, LCSWA ?Phone Number: ?05/30/2021, 11:56 AM ? ? ?Clinical Narrative: ?Pt participated in [place].  Pt stated she does drink ETOH socially.  Pt was offered resources, due to usage of ETOH.   ? ?Passenger transport manager, MSW, LCSW-A ?Pronouns:  She/Her/Hers ?[facility] of [facility] ?Clinical Social Worker ?Direct Number:  [phone] ?[email or web address] ? ?CAGE-AID Screening: ?  ? ?Have You Ever Felt You Ought to Cut Down on Your Drinking or Drug Use?: No ?Have People Annoyed You By Critizing Your Drinking Or Drug Use?: No ?Have You Felt Bad Or Guilty About Your Drinking Or Drug Use?: No ?Have You Ever Had a Drink or Used Drugs First Thing In The Morning to Steady Your Nerves or to Get Rid of a Hangover?: No ?CAGE-AID Score: 0 ? ?  ? ?Substance abuse interventions: Educational Materials ? ? ? ? ? ? ?

## 2021-05-30 NOTE — Telephone Encounter (Signed)
Patient is aware. Now she is stating the neurologist noted it on the MRI and is asking if you will review it.  ?

## 2021-05-30 NOTE — Telephone Encounter (Signed)
Patient left a vm stating she had just gotten out of the hospital and they told her she had a sinus infection. She is requesting you look at her records and see if she needs an antibiotic for this? Please advise. [phone] ?

## 2021-05-30 NOTE — Telephone Encounter (Signed)
Patient is aware 

## 2021-06-05 ENCOUNTER — Ambulatory Visit: Payer: Medicare Other

## 2021-06-05 ENCOUNTER — Other Ambulatory Visit: Payer: Self-pay

## 2021-06-05 DIAGNOSIS — I890 Lymphedema, not elsewhere classified: Secondary | ICD-10-CM

## 2021-06-05 NOTE — Therapy (Signed)
[facility] [address] [place], [place], [address] Phone: [phone]   Fax:  [phone]  Physical Therapy Treatment  Patient Details  Name: Savannah Hill MRN: [id number] Date of Birth: 1950/08/25 Referring Provider (PT): Dr. Jerene Bears   Encounter Date: 06/05/2021   PT End of Session - 06/05/21 1002     Visit Number 28    Number of Visits 30    Date for PT Re-Evaluation 07/31/21    PT Start Time 1002    PT Stop Time 1100    PT Time Calculation (min) 58 min             Past Medical History:  Diagnosis Date   Anemia    borderline   Atrial fibrillation (HCC)    a. s/p ablation on 10/05/2016   Cancer (HCC)    cervical/rad hysterectomy/bso wiht chemo for small cell ca   Dyspnea    Heart valve disorder    HLD (hyperlipidemia)    Hypertension    Joint pain    Lower extremity edema    Obesity    Palpitations     Past Surgical History:  Procedure Laterality Date   ABLATION OF DYSRHYTHMIC FOCUS  10/05/2016   ATRIAL FIBRILLATION ABLATION N/A 10/05/2016   Procedure: Atrial Fibrillation Ablation;  Surgeon: Regan Lemming, MD;  Location: [facility] INVASIVE CV LAB;  Service: Cardiovascular;  Laterality: N/A;   IR RADIOLOGY PERIPHERAL GUIDED IV START  09/28/2016   IR US GUIDE VASC ACCESS RIGHT  09/28/2016   RADICAL ABDOMINAL HYSTERECTOMY  1986   with BSO   REPLACEMENT TOTAL KNEE Right 2022   TEE WITHOUT CARDIOVERSION N/A 06/07/2016   Procedure: TRANSESOPHAGEAL ECHOCARDIOGRAM (TEE);  Surgeon: Thurmon Fair, MD;  Location: [facility] ENDOSCOPY;  Service: Cardiovascular;  Laterality: N/A;    There were no vitals filed for this visit.   Subjective Assessment - 06/05/21 1001     Subjective Pt was hospitalized 05/28/2021 for a CVA. She was released from the hospital on Monday afternoon with no deficits.  They put her back on blood thinners. I have been wearing the new stocking in the day but its a pain. It causes a  ridge at my knee by the end of the day. I cang get it on without the Slipeze    Pertinent History radical hysterectomy in 1986 for small cell cervical endocrine cancer with 40 pelvic lymph nodes removed with chemo, no radiation.  She developed some swelling after the procedure but has noticed and increase as she has gotten older and with some weight gain.  She has a totat knee  June 2022 and has had an increase in lymphedema since.   She has an extremity  pump from [employer] that she got in June. She says she feel some fullness in her lower abdomen on the left side.  She has also used compression stockings. She had recent removal of basal cell carcinoma on left knee    Patient Stated Goals to be able to get her lymphedema under control                   LYMPHEDEMA/ONCOLOGY QUESTIONNAIRE - 06/05/21 0001       Right Lower Extremity Lymphedema   10 cm Proximal to Suprapatella 55.5 cm    At Midpatella/Popliteal Crease 41.9 cm    30 cm Proximal to Floor at Lateral Plantar Foot 45.8 cm    20 cm Proximal to Floor at Lateral  Plantar Foot 40.2 1    10  cm Proximal to Floor at Lateral Malleoli 31.5 cm    5 cm Proximal to 1st MTP Joint 22.3 cm    Around Proximal Great Toe 8.5 cm                        [facility] Adult PT Treatment/Exercise - 06/05/21 0001       Manual Therapy   Manual therapy comments pt remeasured increased at ankle and 20 cm prox to floor   Edema Management pt wearing exostrong stocking. Long enough and does not roll. Foot is extra long on her and she has it folded back but no detriment from it. Discussed ongoing management. Tried on Compre foot for [employer] night garment and it fits, also has Nature conservation officer. Pt to start independent management, but to call with questions or concers. Follow up in around 4 weeks.    Manual Lymphatic Drainage (MLD) continued MLD to short neck, Right axillary and inguinal LN's, 5 breaths, right inguino-axillary pathway, right lateral thigh,  then medial to lateral and lateral thigh retracing pathway, then right lower leg front and back retracing steps and ending with LN's.                       PT Short Term Goals - 06/05/21 1211       PT SHORT TERM GOAL #1   Title Pt/husband will be able to perform self manual lymph drainage or report using pump with appropriate sleeve    Time 4    Period Weeks    Status On-going    Target Date 07/31/21      PT SHORT TERM GOAL #2   Title Pt/ husband will be able to do self compression bandaging    Time 4    Period Weeks    Status Achieved      PT SHORT TERM GOAL #3   Title Pt will report she knows how to use exercise to help control her lymphedema    Time 4    Period Weeks    Status On-going               PT Long Term Goals - 06/05/21 1209       PT LONG TERM GOAL #1   Title Pt will report she has or knows how to get appropriate day and nighttime garments to help control her lymphedema    Time 8    Period Weeks    Status Achieved    Target Date 05/08/21      PT LONG TERM GOAL #2   Title Pt reports she knows how to control her lymphedema at home with lymph drainage manually or with pump, compression and exercise    Time 8    Period Weeks    Status Achieved    Target Date 06/05/21      PT LONG TERM GOAL #3   Title Pt will be independent in self management of Lymphedema    Time 8    Period Weeks    Status New    Target Date 07/31/21                   Plan - 06/05/21 1202     Clinical Impression Statement Pt came in wearing her exostrong stocking. It stays up and fits well except at the foot where it is very long. She has it folded back over her foot  which doesn't seem to be causing problems anywhere, but we also discussed going to a [employer] or seamstress to have it cut and sewn so it doesn't all fray. Pt was remeasured today with slight increases at 10 and 20 cm proximal to floor, but good improvement in all areas above that. The texture of  the leg is good.  She was hospitalized from March 12 through March 13 for a CVA but has no noticeable deficits, but she does still feel fatigued. She has not worn her night garment yet, but did receive the foot piece and it fits well.  She has also received her flexitouch but it has not been set up for her yet. She will wash her stocking out every day or 2 and will try the night garment.  She will set up with Flex[employer] to come out and show her how to use properly. We will follow up in 3-6 weeks to reassess. She knows to message or call in the interim if she has any concerns.    Personal Factors and Comorbidities Comorbidity 3+    Comorbidities chronic lymphedema, abdominal surgery for cervical cancer removal with 40 lymph nodes removed, right total knee in June 2022.    Examination-Activity Limitations Locomotion Level;Stairs    Stability/Clinical Decision Making Stable/Uncomplicated    Rehab Potential Excellent    PT Frequency 1x / week   2 more visits prn in 8 weeks   PT Duration 8 weeks    PT Treatment/Interventions ADLs/Self Care Home Management;Therapeutic exercise;Patient/family education;Orthotic Fit/Training;Manual techniques;Manual lymph drainage;Compression bandaging;Scar mobilization;Taping;Passive range of motion    PT Next Visit Plan Reassess leg, problem solve prn    PT Home Exercise Plan stocking in day, [employer] night in pm, flexitouch    Consulted and Agree with Plan of Care Patient             Patient will benefit from skilled therapeutic intervention in order to improve the following deficits and impairments:  Increased edema, Decreased knowledge of use of DME, Decreased knowledge of precautions  Visit Diagnosis: Lymphedema, not elsewhere classified     Problem List Patient Active Problem List   Diagnosis Date Noted   Stroke (cerebrum) (HCC) 05/28/2021   Encounter for preoperative examination for general surgical procedure 08/18/2020   Insomnia due to anxiety and  fear 08/18/2020   Stress at home- of MOM who is ETOH abuse 06/17/2019   Hyperlipidemia 06/17/2019   HLD (hyperlipidemia)    Obesity 12/05/2018   Vitamin D deficiency 12/05/2018   Postmenopausal 12/05/2018   Family history of diabetes mellitus in father 12/05/2018   Impaired fasting glucose 12/05/2018   Small cell carcinoma (HCC)- of the cervix 12/05/2018   Insect bite 09/17/2018   Healthcare maintenance 09/17/2018   Generalized muscle ache 05/15/2018   Hip pain, bilateral 05/15/2018   Chronic pain of both shoulders 05/15/2018   Essential hypertension 01/29/2018   Insomnia 01/29/2018   Attention deficit hyperactivity disorder (ADHD) 01/29/2018   Mood disorder - mixed depression and anxiety 01/29/2018   Excessive drinking of alcohol 01/29/2018   Encounter for long-term (current) use of high-risk medication 01/29/2018   Inactivity 01/29/2018   Health education/counseling 01/29/2018   Chronic anticoagulation 10/06/2016   Paroxysmal atrial fibrillation (HCC) 10/05/2016   Mitral valve disease    PAF (paroxysmal atrial fibrillation) (HCC) 07/01/2013   LAFB (left anterior fascicular block) 07/01/2013   Lymphedema 07/01/2013   Chest pain at rest 10/01/2011   Leg swelling 10/01/2011   h/o Cervical cancer 10/01/2011  H/O total hysterectomy 10/01/2011    Waynette Buttery, PT 06/05/2021, 12:15 PM  [facility] [address] [place], [place], [address] Phone: [phone]   Fax:  [phone]  Name: Savannah Hill MRN: [id number] Date of Birth: 13-Aug-1950

## 2021-06-08 ENCOUNTER — Telehealth: Payer: Self-pay | Admitting: Cardiology

## 2021-06-08 NOTE — Telephone Encounter (Signed)
Pt c/o medication issue: ? ?1. Name of Medication:  ?apixaban (ELIQUIS) 5 MG TABS tablet ? ?2. How are you currently taking this medication (dosage and times per day)?  ? ?3. Are you having a reaction (difficulty breathing--STAT)?  ? ?4. What is your medication issue?  ? ?Patient called in the schedule post-stroke follow-up with Dr. Curt Bears. She states she had a stroke about 1.5 weeks ago and scheduled for 5/01. She also mentioned she has been put back on Eliquis and would like to make sure Dr. Curt Bears agrees with this. She states he took her off Eliquis in the past. ?

## 2021-06-08 NOTE — Telephone Encounter (Signed)
Called pt in regards to Eliquis. Pt reports never questioned taking Eliquis.  Pt reports I know I have to take the medication d/t recent stroke.  I then asked pt if there was anything needed at this time.  Reports scheduled to see Dr. Curt Bears in May and would like an earlier appointment if possible.  Advised pt I didn't see any openings before scheduled OV. Will route to scheduler in case opening becomes available.   ?

## 2021-06-24 ENCOUNTER — Other Ambulatory Visit: Payer: Self-pay | Admitting: Physician Assistant

## 2021-06-24 DIAGNOSIS — F5101 Primary insomnia: Secondary | ICD-10-CM

## 2021-07-04 ENCOUNTER — Ambulatory Visit: Payer: Medicare Other | Admitting: Cardiology

## 2021-07-10 ENCOUNTER — Ambulatory Visit: Payer: Medicare Other | Attending: Obstetrics & Gynecology

## 2021-07-10 DIAGNOSIS — I890 Lymphedema, not elsewhere classified: Secondary | ICD-10-CM | POA: Insufficient documentation

## 2021-07-10 NOTE — Therapy (Addendum)
[facility] ?[place], [place], [address] ?Phone: [phone]   Fax:  [phone] ? ?Physical Therapy Treatment ? ?Patient Details  ?Name: Savannah Hill ?MRN: [id number] ?Date of Birth: Mar 10, 1951 ?Referring Provider (PT): Dr. Megan Salon ? ? ?Encounter Date: 07/10/2021 ? ? PT End of Session - 07/10/21 1140   ? ? Visit Number 29   ? Number of Visits 30   ? Date for PT Re-Evaluation 07/31/21   ? PT Start Time 1103   ? PT Stop Time 1155   ? PT Time Calculation (min) 52 min   ? Activity Tolerance Patient tolerated treatment well   ? Behavior During Therapy [facility] for tasks assessed/performed   ? ?  ?  ? ?  ? ? ?Past Medical History:  ?Diagnosis Date  ? Anemia   ? borderline  ? Atrial fibrillation ([place])   ? a. s/p ablation on 10/05/2016  ? Cancer [facility])   ? cervical/rad hysterectomy/bso wiht chemo for small cell ca  ? Dyspnea   ? Heart valve disorder   ? HLD (hyperlipidemia)   ? Hypertension   ? Joint pain   ? Lower extremity edema   ? Obesity   ? Palpitations   ? ? ?Past Surgical History:  ?Procedure Laterality Date  ? ABLATION OF DYSRHYTHMIC FOCUS  10/05/2016  ? ATRIAL FIBRILLATION ABLATION N/A 10/05/2016  ? Procedure: Atrial Fibrillation Ablation;  Surgeon: Constance Haw, MD;  Location: [facility];  Service: Cardiovascular;  Laterality: N/A;  ? IR RADIOLOGY PERIPHERAL GUIDED IV START  09/28/2016  ? IR US GUIDE VASC ACCESS RIGHT  09/28/2016  ? RADICAL ABDOMINAL HYSTERECTOMY  1986  ? with BSO  ? REPLACEMENT TOTAL KNEE Right 2022  ? TEE WITHOUT CARDIOVERSION N/A 06/07/2016  ? Procedure: TRANSESOPHAGEAL ECHOCARDIOGRAM (TEE);  Surgeon: Sanda Klein, MD;  Location: [facility];  Service: Cardiovascular;  Laterality: N/A;  ? ? ?There were no vitals filed for this visit. ? ? Subjective Assessment - 07/10/21 1103   ? ? Subjective Things are going well. I am wearing a 15-20 stocking. I use the flexi touch every night for an hour.  I wear  an old compression stocking to bed and sometimes I wear the bootie to my night garment, but I am not wearing the Solaris night leg garment. It is too bulky., The flat knit stocking doesn't fit well. Its too long at the foot.  I haven't taken it to the cobbler yet to modify it.   ? Pertinent History radical hysterectomy in 1986 for small cell cervical endocrine cancer with 40 pelvic lymph nodes removed with chemo, no radiation.  She developed some swelling after the procedure but has noticed and increase as she has gotten older and with some weight gain.  She has a totat knee  June 2022 and has had an increase in lymphedema since.   She has an extremity  pump from [employer] that she got in June. She says she feel some fullness in her lower abdomen on the left side.  She has also used compression stockings. She had recent removal of basal cell carcinoma on left knee   ? Patient Stated Goals to be able to get her lymphedema under control   ? Currently in Pain? No/denies   ? ?  ?  ? ?  ? ? ? ? ? ? ? ? LYMPHEDEMA/ONCOLOGY QUESTIONNAIRE - 07/10/21 0001   ? ?  ? Right Lower Extremity  Lymphedema  ? 10 cm Proximal to Suprapatella 54.6 cm   ? At Midpatella/Popliteal Crease 42 cm   ? 30 cm Proximal to Floor at Lateral Plantar Foot 46.8 cm   ? 20 cm Proximal to Floor at Lateral Plantar Foot 42.4 1   ? 10 cm Proximal to Floor at Lateral Malleoli 33 cm   ? 5 cm Proximal to 1st MTP Joint 22.6 cm   ? Around Proximal Great Toe 8.2 cm   ? ?  ?  ? ?  ? ? ? ? ? ? ? ? ? ? ? ? ? Halfway Adult PT Treatment/Exercise - 07/10/21 0001   ? ?  ? Manual Therapy  ? Manual therapy comments pt remeasured. Leg edema increased  in several places in lower leg with greatest increase 2.2 cm.  Pt tried Solaris night but it is bulky and she is not comfortable in it so despite being told she is not to wear stockings at night that is how she is managing things. We discussed having her flat knit stocking washed and then trying again during the day since it does  not roll at the top and should hold her swelling better.  If she is not going to wear night garment then she should have her husband wrap intermittently to reduce swelling. Pt knows what she needs to do to maintain her leg. She is discharged at this time and will call if she has questions.  ? ?  ?  ? ?  ? ? ? ? ? ? ? ? ? ? ? ? PT Short Term Goals - 06/05/21 1211   ? ?  ? PT SHORT TERM GOAL #1  ? Title Pt/husband will be able to perform self manual lymph drainage or report using pump with appropriate sleeve   ? Time 4   ? Period Weeks   ? Status Using flexitouch   ? Target Date 07/10/2021   ?  ? PT SHORT TERM GOAL #2  ? Title Pt/ husband will be able to do self compression bandaging   ? Time 4   ? Period Weeks   ? Status Achieved   ?  ? PT SHORT TERM GOAL #3  ? Title Pt will report she knows how to use exercise to help control her lymphedema   ? Time 4   ? Period Weeks   ? Status Achieved  ? ?  ?  ? ?  ? ? ? ? PT Long Term Goals - 06/05/21 1209   ? ?  ? PT LONG TERM GOAL #1  ? Title Pt will report she has or knows how to get appropriate day and nighttime garments to help control her lymphedema   ? Time 8   ? Period Weeks   ? Status Achieved   ? Target Date 05/08/21   ?  ? PT LONG TERM GOAL #2  ? Title Pt reports she knows how to control her lymphedema at home with lymph drainage manually or with pump, compression and exercise   ? Time 8   ? Period Weeks   ? Status Achieved   ? Target Date 06/05/21   ?  ? PT LONG TERM GOAL #3  ? Title Pt will be independent in self management of Lymphedema   ? Time 8   ? Period Weeks   ? Status Not achieved. She knows what she is supposed to do, but is not compliant with all.  ? Target Date 07/31/21   ? ?  ?  ? ?  ? ? ? ? ? ? ? ?  Plan - 07/10/21 1159   ? ? Clinical Impression Statement Pt is compliant with her Flexi touch on a nightly basis, but is not wearing her flat knit stocking and is wearing a 15-20 mmhg stocking instead. She is also not wearing her night garment and despite  knowing that stockings are not to be worn at night she is wearing a looser daytime stocking. She said the Solaris night is not at all comfortable and is too bulky.  She has an increase in circumference particularly in the lower leg with her management techniques. I discussed her trying some circular knit stockings from another company and checking compression guru to try and get a better fit. If she will not wear her night garment it was suggested that she have her husband wrap intermittently to reduce her leg before it gets out of control.   ? Personal Factors and Comorbidities Comorbidity 3+   ? Comorbidities chronic lymphedema, abdominal surgery for cervical cancer removal with 40 lymph nodes removed, right total knee in June 2022.   ? Rehab Potential Excellent   ? PT Frequency 1x / week   ? PT Duration 8 weeks   ? PT Treatment/Interventions ADLs/Self Care Home Management;Therapeutic exercise;Patient/family education;Orthotic Fit/Training;Manual techniques;Manual lymph drainage;Compression bandaging;Scar mobilization;Taping;Passive range of motion   ? PT Next Visit Plan DC to independent management   ? PT Home Exercise Plan stocking in day, Solaris night in pm or wrap intermittently, flexitouch   ? Consulted and Agree with Plan of Care Patient   ? ?  ?  ? ?  ? ? ?Patient will benefit from skilled therapeutic intervention in order to improve the following deficits and impairments:  Increased edema, Decreased knowledge of use of DME, Decreased knowledge of precautions ? ?Visit Diagnosis: ?Lymphedema, not elsewhere classified ? ? ? ? ?Problem List ?Patient Active Problem List  ? Diagnosis Date Noted  ? Stroke (cerebrum) ([place]) 05/28/2021  ? Encounter for preoperative examination for general surgical procedure 08/18/2020  ? Insomnia due to anxiety and fear 08/18/2020  ? Stress at home- of MOM who is ETOH abuse 06/17/2019  ? Hyperlipidemia 06/17/2019  ? HLD (hyperlipidemia)   ? Obesity 12/05/2018  ? Vitamin D  deficiency 12/05/2018  ? Postmenopausal 12/05/2018  ? Family history of diabetes mellitus in father 12/05/2018  ? Impaired fasting glucose 12/05/2018  ? Small cell carcinoma ([place])- of the cervix 12/05/2018  ? Insect

## 2021-07-17 ENCOUNTER — Encounter: Payer: Self-pay | Admitting: Adult Health

## 2021-07-17 ENCOUNTER — Ambulatory Visit (INDEPENDENT_AMBULATORY_CARE_PROVIDER_SITE_OTHER): Payer: Medicare Other | Admitting: Adult Health

## 2021-07-17 ENCOUNTER — Encounter: Payer: Self-pay | Admitting: Cardiology

## 2021-07-17 ENCOUNTER — Ambulatory Visit (INDEPENDENT_AMBULATORY_CARE_PROVIDER_SITE_OTHER): Payer: Medicare Other | Admitting: Cardiology

## 2021-07-17 VITALS — BP 122/78 | HR 62 | Ht 63.0 in | Wt 178.6 lb

## 2021-07-17 VITALS — BP 117/80 | HR 69 | Ht 63.0 in | Wt 179.2 lb

## 2021-07-17 DIAGNOSIS — I639 Cerebral infarction, unspecified: Secondary | ICD-10-CM

## 2021-07-17 DIAGNOSIS — D6869 Other thrombophilia: Secondary | ICD-10-CM | POA: Diagnosis not present

## 2021-07-17 DIAGNOSIS — I480 Paroxysmal atrial fibrillation: Secondary | ICD-10-CM

## 2021-07-17 DIAGNOSIS — I63411 Cerebral infarction due to embolism of right middle cerebral artery: Secondary | ICD-10-CM | POA: Diagnosis not present

## 2021-07-17 DIAGNOSIS — R569 Unspecified convulsions: Secondary | ICD-10-CM | POA: Diagnosis not present

## 2021-07-17 MED ORDER — APIXABAN 5 MG PO TABS: 5.0000 mg | ORAL_TABLET | Freq: Two times a day (BID) | ORAL | 3 refills | Status: DC

## 2021-07-17 NOTE — Progress Notes (Signed)
? ?Electrophysiology Office Note ? ? ?Date:  07/17/2021  ? ?ID:  ZETA BUCY, DOB [birth year], MRN [id number] ? ?PCP:  Lorrene Reid, PA-C  ?Cardiologist:  Marlou Porch ?Primary Electrophysiologist:  Elva Mauro Meredith Leeds, MD   ? ?No chief complaint on file. ? ? ?  ?History of Present Illness: ?Savannah Hill is a 71 y.o. female who presents today for electrophysiology evaluation.    ? ?She has a history significant for atrial fibrillation, hypertension, PVCs.  Atrial fibrillation was found after hospitalization in 2013.  She is status post ablation 10/05/2016.  She had had no further episodes of atrial fibrillation and her Eliquis was stopped.  She unfortunately presented to the hospital with cryptogenic stroke.  She received thrombolytics with improvement in her symptoms.  She was restarted on her Eliquis.  The night of her stroke, her husband found her at around 2 AM.  She was laying face down with 1 foot on the floor on the bed.  When he rolled her over, he noticed a facial droop and she was having word finding difficulties with left-sided weakness. ? ?Today, denies symptoms of palpitations, chest pain, shortness of breath, orthopnea, PND, lower extremity edema, claudication, dizziness, presyncope, syncope, bleeding, or neurologic sequela. The patient is tolerating medications without difficulties.  Since her stroke, she has done well.  She continues to have mild word finding difficulties, but overall has no major complaints and is able to do all of her daily activities.  Her only complaint is fatigue. ? ?Past Medical History:  ?Diagnosis Date  ? Anemia   ? borderline  ? Atrial fibrillation ([place])   ? a. s/p ablation on 10/05/2016  ? Cancer [facility])   ? cervical/rad hysterectomy/bso wiht chemo for small cell ca  ? Dyspnea   ? Heart valve disorder   ? HLD (hyperlipidemia)   ? Hypertension   ? Joint pain   ? Lower extremity edema   ? Obesity   ? Palpitations   ? ?Past Surgical History:  ?Procedure Laterality Date  ?  ABLATION OF DYSRHYTHMIC FOCUS  10/05/2016  ? ATRIAL FIBRILLATION ABLATION N/A 10/05/2016  ? Procedure: Atrial Fibrillation Ablation;  Surgeon: Constance Haw, MD;  Location: [facility];  Service: Cardiovascular;  Laterality: N/A;  ? IR RADIOLOGY PERIPHERAL GUIDED IV START  09/28/2016  ? IR US GUIDE VASC ACCESS RIGHT  09/28/2016  ? RADICAL ABDOMINAL HYSTERECTOMY  1986  ? with BSO  ? REPLACEMENT TOTAL KNEE Right 2022  ? TEE WITHOUT CARDIOVERSION N/A 06/07/2016  ? Procedure: TRANSESOPHAGEAL ECHOCARDIOGRAM (TEE);  Surgeon: Sanda Klein, MD;  Location: [place];  Service: Cardiovascular;  Laterality: N/A;  ? ? ? ?Current Outpatient Medications  ?Medication Sig Dispense Refill  ? ALPRAZolam (XANAX) 0.5 MG tablet Take 1 tablet (0.5 mg total) by mouth at bedtime as needed for anxiety. (Patient not taking: Reported on 05/28/2021) 90 tablet 0  ? apixaban (ELIQUIS) 5 MG TABS tablet Take 1 tablet (5 mg total) by mouth 2 (two) times daily. 60 tablet 2  ? benzonatate (TESSALON) 100 MG capsule Take 100 mg by mouth 3 (three) times daily as needed for cough.    ? Cholecalciferol (VITAMIN D3) 125 MCG (5000 UT) CAPS Take 1 capsule by mouth daily.    ? citalopram (CELEXA) 20 MG tablet Take 1 tablet (20 mg total) by mouth daily. 90 tablet 3  ? diltiazem (TIAZAC) 120 MG 24 hr capsule TAKE 2 CAPSULES DAILY, TO  GOAL BLOOD PRESSURE AROUND 130/80 (Patient taking differently:  Take 240 mg by mouth daily. TAKE 2 CAPSULES DAILY, TO  GOAL BLOOD PRESSURE AROUND 130/80) 180 capsule 1  ? docusate sodium (COLACE) 100 MG capsule Take 100 mg by mouth daily.    ? furosemide (LASIX) 20 MG tablet TAKE 1 TABLET DAILY (Patient taking differently: Take 20 mg by mouth daily.) 90 tablet 1  ? levETIRAcetam (KEPPRA) 500 MG tablet Take 1 tablet (500 mg total) by mouth 2 (two) times daily. 60 tablet 2  ? Multiple Vitamin (MULTIVITAMIN WITH MINERALS) TABS Take 1 tablet by mouth daily.    ? rosuvastatin (CRESTOR) 5 MG tablet TAKE 1 TABLET AT  BEDTIME (Patient taking differently: Take 5 mg by mouth daily.) 90 tablet 1  ? traZODone (DESYREL) 100 MG tablet TAKE 1 TABLET AT BEDTIME ASNEEDED FOR SLEEP 30 tablet 0  ? ?No current facility-administered medications for this visit.  ? ? ?Allergies:   Patient has no known allergies.  ? ?Social History:  The patient  reports that she has never smoked. She has never used smokeless tobacco. She reports current alcohol use of about 4.0 - 5.0 standard drinks per week. She reports that she does not use drugs.  ? ?Family History:  The patient's family history includes Alcoholism in her mother; CVA in her father; Cancer in her paternal grandfather; Depression in her mother; Diabetes in her father and mother; Heart disease in her mother; Hypertension in her father and mother; Lung cancer in her mother; Skin cancer in her father; Thyroid disease in her mother.  ? ?ROS:  Please see the history of present illness.   Otherwise, review of systems is positive for none.   All other systems are reviewed and negative.  ? ?PHYSICAL EXAM: ?VS:  BP 122/78   Pulse 62   Ht '5\' 3"'$  (1.6 m)   Wt 178 lb 9.6 oz (81 kg)   LMP 03/19/1984   SpO2 96%   BMI 31.64 kg/m?  , BMI Body mass index is 31.64 kg/m?. ?GEN: Well nourished, well developed, in no acute distress  ?HEENT: normal  ?Neck: no JVD, carotid bruits, or masses ?Cardiac: RRR; no murmurs, rubs, or gallops,no edema  ?Respiratory:  clear to auscultation bilaterally, normal work of breathing ?GI: soft, nontender, nondistended, + BS ?MS: no deformity or atrophy  ?Skin: warm and dry ?Neuro:  Strength and sensation are intact ?Psych: euthymic mood, full affect ? ?EKG:  EKG is not ordered today. ?Personal review of the ekg ordered 05/28/21 shows sinus rhythm, first-degree AV block, PAC, rate 74 ? ?Recent Labs: ?01/31/2021: TSH 3.800 ?05/28/2021: ALT 11 ?05/29/2021: BUN 8; Creatinine, Ser 0.90; Hemoglobin 11.2; Magnesium 2.0; Platelets 273; Potassium 4.3; Sodium 139  ? ? ?Lipid Panel  ?    ?Component Value Date/Time  ? CHOL 126 05/29/2021 0643  ? CHOL 158 01/31/2021 1013  ? TRIG 52 05/29/2021 0643  ? HDL 51 05/29/2021 0643  ? HDL 65 01/31/2021 1013  ? CHOLHDL 2.5 05/29/2021 0643  ? VLDL 10 05/29/2021 0643  ? [place] 65 05/29/2021 0643  ? LDLCALC 77 01/31/2021 1013  ? ? ? ?Wt Readings from Last 3 Encounters:  ?05/28/21 196 lb 6.9 oz (89.1 kg)  ?05/16/21 183 lb (83 kg)  ?02/17/21 189 lb 6.4 oz (85.9 kg)  ?  ? ? ?Other studies Reviewed: ?Additional studies/ records that were reviewed today include: TTE 05/28/21 ?Review of the above records today demonstrates:  ? 1. Left ventricular ejection fraction, by estimation, is 55 to 60%. The  ?left ventricle has normal  function. The left ventricle has no regional  ?wall motion abnormalities. Left ventricular diastolic parameters are  ?indeterminate.  ? 2. Right ventricular systolic function is normal. The right ventricular  ?size is normal. There is normal pulmonary artery systolic pressure.  ? 3. Left atrial size was mildly dilated.  ? 4. A small pericardial effusion is present. There is no evidence of  ?cardiac tamponade.  ? 5. The mitral valve is grossly normal. Moderate mitral valve  ?regurgitation. No evidence of mitral stenosis.  ? 6. The aortic valve is tricuspid. There is mild calcification of the  ?aortic valve. Aortic valve regurgitation is not visualized. Aortic valve  ?sclerosis is present, with no evidence of aortic valve stenosis.  ? 7. The inferior vena cava is normal in size with <50% respiratory  ?variability, suggesting right atrial pressure of 8 mmHg.  ? ?SPECT 06/15/16 ?Nuclear stress EF: 58%. ?There was no ST segment deviation noted during stress. ?The study is normal. ?This is a low risk study. ?The left ventricular ejection fraction is hyperdynamic (>65%). ?  ?Normal pharmacologic nuclear stress test with no evidence of prior infarct or ischemia.  ? ?ASSESSMENT AND PLAN: ? ?1.  Paroxysmal atrial fibrillation/flutter: Status post ablation  10/05/2016.  CHA2DS2-VASc of 3.  She had had no further episodes of atrial fibrillation after ablation.  Eliquis has since been stopped.  Fortunately she had a stroke.  She was noted to be in atrial fibrillation in the

## 2021-07-17 NOTE — Progress Notes (Signed)
? ? ?PATIENT: Savannah Hill ?DOB: March 06, 1951 ? ?REASON FOR VISIT: follow up ?HISTORY FROM: patient ?PRIMARY NEUROLOGIST: Dr. Leonie Man ? ? ?Chief Complaint  ?Patient presents with  ? Follow-up  ?  Pt in 19  with husband  pt is here for stroke follow up  Pt states she is more fatigued and she would like to come off seizure  medication .  Pt states keppra was new medication that hospital D/C her home with   ? ? ?HISTORY OF PRESENT ILLNESS: ?Today 07/17/21 ?Paitent went to the ED after husband noticed kerking in the bed, bit her tongue. Noticed left facial droop and left sided weakness. She did receive TPA.  ?Stroke- right MCA cortical small infarct secondary to PAF s/p ablation and eliquis d/c'd in 11/2020 ?Code Stroke CT head No acute abnormality.     ?MRI 1.2 cm acute ischemic cortical infarct involving the posterior right frontal region.   ?MRA normal intracranial head and neck MRA ?2D Echo EF 55-60% ?LDL 65 on Rouvastatin ?HgbA1c 5.9 ?Now on Eliquis  ? ?Possible seizure event- was started on Keppra 500 mg BID in the hospital. Reports that she is fatigued. Would like to come off seizure medication. No other seizure events.  ? ?Reports that she has been charting her BP- BP has been running on the low side for her. Has not done any type of therapy- she does not feel that she needs it. Symptoms have resolved.  ? ?REVIEW OF SYSTEMS: Out of a complete 14 system review of symptoms, the patient complains only of the following symptoms, and all other reviewed systems are negative. ? ?ALLERGIES: ?No Known Allergies ? ?HOME MEDICATIONS: ?Outpatient Medications Prior to Visit  ?Medication Sig Dispense Refill  ? ALPRAZolam (XANAX) 0.5 MG tablet Take 1 tablet (0.5 mg total) by mouth at bedtime as needed for anxiety. 90 tablet 0  ? apixaban (ELIQUIS) 5 MG TABS tablet Take 1 tablet (5 mg total) by mouth 2 (two) times daily. 60 tablet 2  ? Cholecalciferol (VITAMIN D3) 125 MCG (5000 UT) CAPS Take 1 capsule by mouth daily.    ?  citalopram (CELEXA) 20 MG tablet Take 1 tablet (20 mg total) by mouth daily. 90 tablet 3  ? diltiazem (TIAZAC) 120 MG 24 hr capsule TAKE 2 CAPSULES DAILY, TO  GOAL BLOOD PRESSURE AROUND 130/80 (Patient taking differently: Take 240 mg by mouth daily. TAKE 2 CAPSULES DAILY, TO  GOAL BLOOD PRESSURE AROUND 130/80) 180 capsule 1  ? docusate sodium (COLACE) 100 MG capsule Take 100 mg by mouth daily.    ? furosemide (LASIX) 20 MG tablet TAKE 1 TABLET DAILY (Patient taking differently: Take 20 mg by mouth daily.) 90 tablet 1  ? levETIRAcetam (KEPPRA) 500 MG tablet Take 1 tablet (500 mg total) by mouth 2 (two) times daily. 60 tablet 2  ? Multiple Vitamin (MULTIVITAMIN WITH MINERALS) TABS Take 1 tablet by mouth daily.    ? rosuvastatin (CRESTOR) 5 MG tablet TAKE 1 TABLET AT BEDTIME (Patient taking differently: Take 5 mg by mouth daily.) 90 tablet 1  ? traZODone (DESYREL) 100 MG tablet TAKE 1 TABLET AT BEDTIME ASNEEDED FOR SLEEP 30 tablet 0  ? benzonatate (TESSALON) 100 MG capsule Take 100 mg by mouth 3 (three) times daily as needed for cough.    ? ?No facility-administered medications prior to visit.  ? ? ?PAST MEDICAL HISTORY: ?Past Medical History:  ?Diagnosis Date  ? Anemia   ? borderline  ? Atrial fibrillation ([place])   ?  a. s/p ablation on 10/05/2016  ? Cancer [facility]   ? cervical/rad hysterectomy/bso wiht chemo for small cell ca  ? Dyspnea   ? Heart valve disorder   ? HLD (hyperlipidemia)   ? Hypertension   ? Joint pain   ? Lower extremity edema   ? Obesity   ? Palpitations   ? ? ?PAST SURGICAL HISTORY: ?Past Surgical History:  ?Procedure Laterality Date  ? ABLATION OF DYSRHYTHMIC FOCUS  10/05/2016  ? ATRIAL FIBRILLATION ABLATION N/A 10/05/2016  ? Procedure: Atrial Fibrillation Ablation;  Surgeon: Constance Haw, MD;  Location: [place] [facility];  Service: Cardiovascular;  Laterality: N/A;  ? IR RADIOLOGY PERIPHERAL GUIDED IV START  09/28/2016  ? IR US GUIDE VASC ACCESS RIGHT  09/28/2016  ? RADICAL ABDOMINAL  HYSTERECTOMY  1986  ? with BSO  ? REPLACEMENT TOTAL KNEE Right 2022  ? TEE WITHOUT CARDIOVERSION N/A 06/07/2016  ? Procedure: TRANSESOPHAGEAL ECHOCARDIOGRAM (TEE);  Surgeon: Sanda Klein, MD;  Location: [facility] ENDOSCOPY;  Service: Cardiovascular;  Laterality: N/A;  ? ? ?FAMILY HISTORY: ?Family History  ?Problem Relation Age of Onset  ? Diabetes Mother   ? Hypertension Mother   ? Lung cancer Mother   ? Heart disease Mother   ? Thyroid disease Mother   ? Depression Mother   ? Alcoholism Mother   ? Skin cancer Father   ? CVA Father   ? Diabetes Father   ? Hypertension Father   ? Cancer Paternal Grandfather   ?     unknown type  ? ? ?SOCIAL HISTORY: ?Social History  ? ?Socioeconomic History  ? Marital status: Married  ?  Spouse name: Kaitlyn Franko  ? Number of children: 1  ? Years of education: Not on file  ? Highest education level: Not on file  ?Occupational History  ? 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, [employer]  ?Tobacco Use  ? Smoking status: Never  ? Smokeless tobacco: Never  ?Vaping Use  ? Vaping Use: Never used  ?Substance and Sexual Activity  ? Alcohol use: Yes  ?  Alcohol/week: 10.0 standard drinks  ?  Types: 10 Glasses of wine per week  ? Drug use: No  ? Sexual activity: Yes  ?  Partners: Male  ?  Birth control/protection: Surgical  ?Other Topics Concern  ? Not on file  ?Social History Narrative  ? 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 Takes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  ? Married  ? ?Social Determinants of Health  ? ?Financial Resource Strain: Not on file  ?Food Insecurity: Not on file  ?Transportation Needs: Not on file  ?Physical Activity: Not on file  ?Stress: Not on file  ?Social Connections: Not on file  ?Intimate Partner Violence: Not on file  ? ? ? ? ?PHYSICAL EXAM ? ?Vitals:  ? 07/17/21 0855  ?BP: 117/80  ?Pulse: 69  ?Weight: 179 lb 3.2 oz (81.3 kg)  ?Height: '5\' 3"'$  (1.6 m)  ? ?Body mass index is 31.74 kg/m?. ? ?Generalized: Well developed, in no acute distress  ? ?Neurological examination   ?Mentation: Alert oriented to time, place, history taking. Follows all commands speech and language fluent ?Cranial nerve II-XII: Pupils were equal round reactive to light. Extraocular movements were full, visual field were full on confrontational test. Facial sensation and strength were normal. Head turning and shoulder shrug  were normal and symmetric. ?Motor: The motor testing reveals 5 over 5 strength of all 4 extremities. Good symmetric motor tone is noted throughout.  ?  Sensory: Sensory testing is intact to soft touch on all 4 extremities. No evidence of extinction is noted.  ?Coordination: Cerebellar testing reveals good finger-nose-finger and heel-to-shin bilaterally.  ?Gait and station: Gait is normal.  ?Reflexes: Deep tendon reflexes are symmetric and normal bilaterally.  ? ?DIAGNOSTIC DATA (LABS, IMAGING, TESTING) ?- I reviewed patient records, labs, notes, testing and imaging myself where available. ? ?Lab Results  ?Component Value Date  ? WBC 14.3 (H) 05/29/2021  ? HGB 11.2 (L) 05/29/2021  ? HCT 32.9 (L) 05/29/2021  ? MCV 93.7 05/29/2021  ? PLT 273 05/29/2021  ? ?   ?Component Value Date/Time  ? NA 139 05/29/2021 0643  ? NA 141 01/31/2021 1013  ? K 4.3 05/29/2021 0643  ? CL 106 05/29/2021 0643  ? CO2 26 05/29/2021 0643  ? GLUCOSE 103 (H) 05/29/2021 2035  ? BUN 8 05/29/2021 0643  ? BUN 11 01/31/2021 1013  ? CREATININE 0.90 05/29/2021 0643  ? CREATININE 0.96 12/19/2015 1524  ? CALCIUM 8.5 (L) 05/29/2021 5974  ? PROT 7.0 05/28/2021 0353  ? PROT 7.1 01/31/2021 1013  ? ALBUMIN 4.0 05/28/2021 0353  ? ALBUMIN 4.5 01/31/2021 1013  ? AST 24 05/28/2021 0353  ? ALT 11 05/28/2021 0353  ? ALKPHOS 60 05/28/2021 0353  ? BILITOT 0.6 05/28/2021 0353  ? BILITOT 0.6 01/31/2021 1013  ? GFRNONAA >60 05/29/2021 0643  ? GFRAA 71 01/21/2020 0954  ? ?Lab Results  ?Component Value Date  ? CHOL 126 05/29/2021  ? HDL 51 05/29/2021  ? [place] 65 05/29/2021  ? TRIG 52 05/29/2021  ? CHOLHDL 2.5 05/29/2021  ? ?Lab Results  ?Component  Value Date  ? HGBA1C 5.9 (H) 05/29/2021  ? ?No results found for: VITAMINB12 ?Lab Results  ?Component Value Date  ? TSH 3.800 01/31/2021  ? ? ? ? ?ASSESSMENT AND PLAN ?71 y.o. year old female  has a past medical history

## 2021-07-19 ENCOUNTER — Other Ambulatory Visit: Payer: Medicare Other | Admitting: *Deleted

## 2021-07-20 NOTE — Progress Notes (Signed)
I agree with the above plan 

## 2021-07-26 ENCOUNTER — Other Ambulatory Visit: Payer: Self-pay | Admitting: Physician Assistant

## 2021-07-26 ENCOUNTER — Ambulatory Visit: Payer: Medicare Other | Admitting: Neurology

## 2021-07-26 DIAGNOSIS — E785 Hyperlipidemia, unspecified: Secondary | ICD-10-CM

## 2021-07-26 DIAGNOSIS — R569 Unspecified convulsions: Secondary | ICD-10-CM

## 2021-07-26 DIAGNOSIS — I10 Essential (primary) hypertension: Secondary | ICD-10-CM

## 2021-07-26 DIAGNOSIS — I890 Lymphedema, not elsewhere classified: Secondary | ICD-10-CM

## 2021-07-26 DIAGNOSIS — I480 Paroxysmal atrial fibrillation: Secondary | ICD-10-CM

## 2021-08-04 ENCOUNTER — Encounter: Payer: Self-pay | Admitting: Adult Health

## 2021-08-07 NOTE — Telephone Encounter (Signed)
agreed

## 2021-08-23 ENCOUNTER — Other Ambulatory Visit: Payer: Self-pay | Admitting: Physician Assistant

## 2021-08-23 ENCOUNTER — Encounter: Payer: Self-pay | Admitting: Cardiology

## 2021-08-23 DIAGNOSIS — I890 Lymphedema, not elsewhere classified: Secondary | ICD-10-CM

## 2021-08-23 DIAGNOSIS — I480 Paroxysmal atrial fibrillation: Secondary | ICD-10-CM

## 2021-08-23 DIAGNOSIS — I10 Essential (primary) hypertension: Secondary | ICD-10-CM

## 2021-08-23 DIAGNOSIS — F5101 Primary insomnia: Secondary | ICD-10-CM

## 2021-08-24 NOTE — Telephone Encounter (Signed)
Moved into a new home several weeks ago and has been under stress. HRs are controlled below 100. Does have some fatigue, but could be r/t anti-seizure med, stress, move and the afib could all be contributing factors. Stroke recently and was restarted on Eliquis Has kardia mobile, but pt does not when she is and out of rhythm. Wondering what she should do about her afib. Aware I will discuss this further with MD tomorrow and  let  her know. Patient verbalized understanding and agreeable to plan.

## 2021-08-25 NOTE — Telephone Encounter (Signed)
Pt aware Dr. Curt Bears recommends f/u in the AFib clinic to discuss treatment plan. Aware they can schedule DCCV if needed.

## 2021-08-29 ENCOUNTER — Ambulatory Visit (HOSPITAL_COMMUNITY)
Admission: RE | Admit: 2021-08-29 | Discharge: 2021-08-29 | Disposition: A | Discharge status: Home / Self Care | Payer: Medicare Other | Source: Ambulatory Visit | Attending: Nurse Practitioner | Admitting: Nurse Practitioner

## 2021-08-29 VITALS — BP 114/66 | HR 64 | Ht 63.0 in | Wt 182.6 lb

## 2021-08-29 DIAGNOSIS — Z8673 Personal history of transient ischemic attack (TIA), and cerebral infarction without residual deficits: Secondary | ICD-10-CM | POA: Diagnosis not present

## 2021-08-29 DIAGNOSIS — Z7901 Long term (current) use of anticoagulants: Secondary | ICD-10-CM | POA: Diagnosis not present

## 2021-08-29 DIAGNOSIS — R002 Palpitations: Secondary | ICD-10-CM

## 2021-08-29 DIAGNOSIS — D6869 Other thrombophilia: Secondary | ICD-10-CM | POA: Diagnosis not present

## 2021-08-29 DIAGNOSIS — I10 Essential (primary) hypertension: Secondary | ICD-10-CM | POA: Diagnosis not present

## 2021-08-29 DIAGNOSIS — I4891 Unspecified atrial fibrillation: Secondary | ICD-10-CM | POA: Insufficient documentation

## 2021-08-29 DIAGNOSIS — I480 Paroxysmal atrial fibrillation: Secondary | ICD-10-CM

## 2021-08-29 MED ORDER — LEVETIRACETAM 500 MG PO TABS: ORAL_TABLET | ORAL | Status: DC

## 2021-08-29 NOTE — Progress Notes (Signed)
Primary Care Physician: Lorrene Reid, PA-C Referring Physician: Dr. Janit Pagan Savannah Hill is a 71 y.o. female with a h/o PAF, ablation in 2018 and CVA in March.She placed a phone call to Dr. Macky Lower  office last week saying she was in afib. She was referred to the afib clinic. She had been off anticoagulation since 2018 with no afib after ablation but was placed back on it at time of stroke. Ekg today shows SR. She has a Investment banker, operational and strips reviewed. The strips that read possible afib, I feel is SR with PAC's. There is one strip due to artifact I cannot be sure is Afib or PAC's. She has changed residence in the last few weeks and this has been very stressful. Her husband has noted some heavier snoring in the last week or so, pt feels it is because she has been so exhausted, wants to defer sleep study at this time.. She does drink 3 glasses of wine every other night and I discussed how this can contribute to afib as well as PC's  and avoidance is suggested. She drinks 2-3 caffeine drinks a day. No smoking. She is tired a lot. She will usually note palpitations in the evening around 8p. She takes her second Cardizem at 10 pm and advised to move this up to 8pm on a daily basis as she takes her am at 8a.    Today, she denies symptoms of palpitations, chest pain, shortness of breath, orthopnea, PND, lower extremity edema, dizziness, presyncope, syncope, or neurologic sequela. The patient is tolerating medications without difficulties and is otherwise without complaint today.   Past Medical History:  Diagnosis Date   Anemia    borderline   Atrial fibrillation ([place])    a. s/p ablation on 10/05/2016   Cancer [facility])    cervical/rad hysterectomy/bso wiht chemo for small cell ca   Dyspnea    Heart valve disorder    HLD (hyperlipidemia)    Hypertension    Joint pain    Lower extremity edema    Obesity    Palpitations    Past Surgical History:  Procedure Laterality Date   ABLATION OF  DYSRHYTHMIC FOCUS  10/05/2016   ATRIAL FIBRILLATION ABLATION N/A 10/05/2016   Procedure: Atrial Fibrillation Ablation;  Surgeon: Constance Haw, MD;  Location: [place] [facility];  Service: Cardiovascular;  Laterality: N/A;   IR RADIOLOGY PERIPHERAL GUIDED IV START  09/28/2016   IR US GUIDE VASC ACCESS RIGHT  09/28/2016   RADICAL ABDOMINAL HYSTERECTOMY  1986   with BSO   REPLACEMENT TOTAL KNEE Right 2022   TEE WITHOUT CARDIOVERSION N/A 06/07/2016   Procedure: TRANSESOPHAGEAL ECHOCARDIOGRAM (TEE);  Surgeon: Sanda Klein, MD;  Location: [facility] ENDOSCOPY;  Service: Cardiovascular;  Laterality: N/A;    Current Outpatient Medications  Medication Sig Dispense Refill   ALPRAZolam (XANAX) 0.5 MG tablet Take 1 tablet (0.5 mg total) by mouth at bedtime as needed for anxiety. 90 tablet 0   apixaban (ELIQUIS) 5 MG TABS tablet Take 1 tablet (5 mg total) by mouth 2 (two) times daily. 180 tablet 3   Cholecalciferol (VITAMIN D3) 125 MCG (5000 UT) CAPS Take 1 capsule by mouth daily.     citalopram (CELEXA) 20 MG tablet Take 1 tablet (20 mg total) by mouth daily. 90 tablet 3   diltiazem (TIAZAC) 120 MG 24 hr capsule TAKE 2 CAPSULES DAILY, TO  GOAL BLOOD PRESSURE AROUND 130/80 60 capsule 0   docusate sodium (COLACE) 100 MG capsule  Take 100 mg by mouth daily.     furosemide (LASIX) 20 MG tablet TAKE 1 TABLET DAILY 30 tablet 0   levETIRAcetam (KEPPRA) 500 MG tablet Take 1 tablet (500 mg total) by mouth 2 (two) times daily. 60 tablet 2   Multiple Vitamin (MULTIVITAMIN WITH MINERALS) TABS Take 1 tablet by mouth daily.     rosuvastatin (CRESTOR) 5 MG tablet TAKE 1 TABLET AT BEDTIME 30 tablet 0   traZODone (DESYREL) 100 MG tablet TAKE 1 TABLET AT BEDTIME ASNEEDED FOR SLEEP 30 tablet 0   No current facility-administered medications for this encounter.    No Known Allergies  Social History   Socioeconomic History   Marital status: Married    Spouse name: Britzy Graul   Number of children: 1   Years  of education: Not on file   Highest education level: Not on file  Occupational History   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, [employer]  Tobacco Use   Smoking status: Never   Smokeless tobacco: Never  Vaping Use   Vaping Use: Never used  Substance and Sexual Activity   Alcohol use: Yes    Alcohol/week: 10.0 standard drinks of alcohol    Types: 10 Glasses of wine per week   Drug use: No   Sexual activity: Yes    Partners: Male    Birth control/protection: Surgical  Other Topics Concern   Not on file  Social History Narrative   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[place], [place]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   Married   Social Determinants of Health   Financial Resource Strain: Not on file  Food Insecurity: Not on file  Transportation Needs: Not on file  Physical Activity: Not on file  Stress: Not on file  Social Connections: Not on file  Intimate Partner Violence: Not on file    Family History  Problem Relation Age of Onset   Diabetes Mother    Hypertension Mother    Lung cancer Mother    Heart disease Mother    Thyroid disease Mother    Depression Mother    Alcoholism Mother    Skin cancer Father    CVA Father    Diabetes Father    Hypertension Father    Cancer Paternal Grandfather        unknown type    ROS- All systems are reviewed and negative except as per the HPI above  Physical Exam: Vitals:   08/29/21 1502  Height: '5\' 3"'$  (1.6 m)   Wt Readings from Last 3 Encounters:  07/17/21 81 kg  07/17/21 81.3 kg  05/28/21 89.1 kg    Labs: Lab Results  Component Value Date   NA 139 05/29/2021   K 4.3 05/29/2021   CL 106 05/29/2021   CO2 26 05/29/2021   GLUCOSE 103 (H) 05/29/2021   BUN 8 05/29/2021   CREATININE 0.90 05/29/2021   CALCIUM 8.5 (L) 05/29/2021   PHOS 4.5 05/28/2021   MG 2.0 05/29/2021   Lab Results  Component Value Date   INR 0.9 05/28/2021   Lab Results  Component Value Date   CHOL 126 05/29/2021   HDL 51 05/29/2021   LDLCALC 65  05/29/2021   TRIG 52 05/29/2021     GEN- The patient is well appearing, alert and oriented x 3 today.   Head- normocephalic, atraumatic Eyes-  Sclera clear, conjunctiva pink Ears- hearing intact Oropharynx- clear Neck- supple, no JVP Lymph- no cervical lymphadenopathy Lungs- Clear to ausculation bilaterally,  normal work of breathing Heart- Regular rate and rhythm, no murmurs, rubs or gallops, PMI not laterally displaced GI- soft, NT, ND, + BS Extremities- no clubbing, cyanosis, or edema MS- no significant deformity or atrophy Skin- no rash or lesion Psych- euthymic mood, full affect Neuro- strength and sensation are intact  EKG-Vent. rate 64 BPM PR interval 284 ms QRS duration 114 ms QT/QTcB 452/466 ms P-R-T axes 71 -66 81 Sinus rhythm with 1st degree A-V block Left axis deviation Minimal voltage criteria for LVH, may be normal variant ( Cornell product ) Anterolateral infarct , age undetermined Abnormal ECG When compared with ECG of 28-May-2021 09:39, PREVIOUS ECG IS PRESENT    Assessment and Plan:  1. Afib  Quiet since ablation in 2018. She has recently noted some irregular heartbeat in the pm, kardia strips reviewed and mostly show SR with PAC's, but cannot eliminate some afib of short duration.  I will place a 2 week ZIo patch In the interim move pm Cardizem to 8 pm instead of 10 pm Avoid alcohol  I will not add additional  rate control at this point She did not tolerate BB well in the past  Continue with Cardizem 120 mg bid  For now husband will follow her snoring to see if sleep study is needed I the future  2. Previous CVA in March 2023 with a [id number]  score of 5 Continue eliquis 5 mg bid   3. HTN Stable   I will call her with results of monitor for further plans   Savannah Hill, [facility] [address] [place], [place] [address] [phone]

## 2021-09-08 ENCOUNTER — Other Ambulatory Visit: Payer: Self-pay

## 2021-09-11 ENCOUNTER — Other Ambulatory Visit: Payer: Self-pay

## 2021-09-14 DIAGNOSIS — M25561 Pain in right knee: Secondary | ICD-10-CM | POA: Diagnosis not present

## 2021-09-14 DIAGNOSIS — Z96651 Presence of right artificial knee joint: Secondary | ICD-10-CM | POA: Diagnosis not present

## 2021-09-18 DIAGNOSIS — R002 Palpitations: Secondary | ICD-10-CM | POA: Diagnosis not present

## 2021-09-19 ENCOUNTER — Other Ambulatory Visit: Payer: Self-pay | Admitting: Physician Assistant

## 2021-09-19 DIAGNOSIS — I480 Paroxysmal atrial fibrillation: Secondary | ICD-10-CM

## 2021-09-19 DIAGNOSIS — E785 Hyperlipidemia, unspecified: Secondary | ICD-10-CM

## 2021-09-19 DIAGNOSIS — F5101 Primary insomnia: Secondary | ICD-10-CM

## 2021-09-19 DIAGNOSIS — I890 Lymphedema, not elsewhere classified: Secondary | ICD-10-CM

## 2021-09-19 DIAGNOSIS — I10 Essential (primary) hypertension: Secondary | ICD-10-CM

## 2021-09-20 ENCOUNTER — Encounter (HOSPITAL_COMMUNITY): Payer: Self-pay | Admitting: *Deleted

## 2021-10-02 ENCOUNTER — Other Ambulatory Visit: Payer: Self-pay | Admitting: Physician Assistant

## 2021-10-02 ENCOUNTER — Encounter: Payer: Self-pay | Admitting: Adult Health

## 2021-10-02 DIAGNOSIS — F5101 Primary insomnia: Secondary | ICD-10-CM

## 2021-10-02 DIAGNOSIS — I890 Lymphedema, not elsewhere classified: Secondary | ICD-10-CM

## 2021-10-02 DIAGNOSIS — I10 Essential (primary) hypertension: Secondary | ICD-10-CM

## 2021-10-02 DIAGNOSIS — E785 Hyperlipidemia, unspecified: Secondary | ICD-10-CM

## 2021-10-02 DIAGNOSIS — I480 Paroxysmal atrial fibrillation: Secondary | ICD-10-CM

## 2021-10-03 NOTE — Telephone Encounter (Signed)
Can drive when seizure free for 6 months. She should NOT stop seizure medicine until we meet again to discuss

## 2021-10-04 NOTE — Patient Instructions (Signed)
Insomnia Insomnia is a sleep disorder that makes it difficult to fall asleep or stay asleep. Insomnia can cause fatigue, low energy, difficulty concentrating, mood swings, and poor performance at work or school. There are three different ways to classify insomnia: Difficulty falling asleep. Difficulty staying asleep. Waking up too early in the morning. Any type of insomnia can be long-term (chronic) or short-term (acute). Both are common. Short-term insomnia usually lasts for 3 months or less. Chronic insomnia occurs at least three times a week for longer than 3 months. What are the causes? Insomnia may be caused by another condition, situation, or substance, such as: Having certain mental health conditions, such as anxiety and depression. Using caffeine, alcohol, tobacco, or drugs. Having gastrointestinal conditions, such as gastroesophageal reflux disease (GERD). Having certain medical conditions. These include: Asthma. Alzheimer's disease. Stroke. Chronic pain. An overactive thyroid gland (hyperthyroidism). Other sleep disorders, such as restless legs syndrome and sleep apnea. Menopause. Sometimes, the cause of insomnia may not be known. What increases the risk? Risk factors for insomnia include: Gender. Females are affected more often than males. Age. Insomnia is more common as people get older. Stress and certain medical and mental health conditions. Lack of exercise. Having an irregular work schedule. This may include working night shifts and traveling between different time zones. What are the signs or symptoms? If you have insomnia, the main symptom is having trouble falling asleep or having trouble staying asleep. This may lead to other symptoms, such as: Feeling tired or having low energy. Feeling nervous about going to sleep. Not feeling rested in the morning. Having trouble concentrating. Feeling irritable, anxious, or depressed. How is this diagnosed? This condition  may be diagnosed based on: Your symptoms and medical history. Your health care provider may ask about: Your sleep habits. Any medical conditions you have. Your mental health. A physical exam. How is this treated? Treatment for insomnia depends on the cause. Treatment may focus on treating an underlying condition that is causing the insomnia. Treatment may also include: Medicines to help you sleep. Counseling or therapy. Lifestyle adjustments to help you sleep better. Follow these instructions at home: Eating and drinking  Limit or avoid alcohol, caffeinated beverages, and products that contain nicotine and tobacco, especially close to bedtime. These can disrupt your sleep. Do not eat a large meal or eat spicy foods right before bedtime. This can lead to digestive discomfort that can make it hard for you to sleep. Sleep habits  Keep a sleep diary to help you and your health care provider figure out what could be causing your insomnia. Write down: When you sleep. When you wake up during the night. How well you sleep and how rested you feel the next day. Any side effects of medicines you are taking. What you eat and drink. Make your bedroom a dark, comfortable place where it is easy to fall asleep. Put up shades or blackout curtains to block light from outside. Use a white noise machine to block noise. Keep the temperature cool. Limit screen use before bedtime. This includes: Not watching TV. Not using your smartphone, tablet, or computer. Stick to a routine that includes going to bed and waking up at the same times every day and night. This can help you fall asleep faster. Consider making a quiet activity, such as reading, part of your nighttime routine. Try to avoid taking naps during the day so that you sleep better at night. Get out of bed if you are still awake after   15 minutes of trying to sleep. Keep the lights down, but try reading or doing a quiet activity. When you feel  sleepy, go back to bed. General instructions Take over-the-counter and prescription medicines only as told by your health care provider. Exercise regularly as told by your health care provider. However, avoid exercising in the hours right before bedtime. Use relaxation techniques to manage stress. Ask your health care provider to suggest some techniques that may work well for you. These may include: Breathing exercises. Routines to release muscle tension. Visualizing peaceful scenes. Make sure that you drive carefully. Do not drive if you feel very sleepy. Keep all follow-up visits. This is important. Contact a health care provider if: You are tired throughout the day. You have trouble in your daily routine due to sleepiness. You continue to have sleep problems, or your sleep problems get worse. Get help right away if: You have thoughts about hurting yourself or someone else. Get help right away if you feel like you may hurt yourself or others, or have thoughts about taking your own life. Go to your nearest emergency room or: Call 911. Call the National Suicide Prevention Lifeline at 1-800-273-8255 or 988. This is open 24 hours a day. Text the Crisis Text Line at 741741. Summary Insomnia is a sleep disorder that makes it difficult to fall asleep or stay asleep. Insomnia can be long-term (chronic) or short-term (acute). Treatment for insomnia depends on the cause. Treatment may focus on treating an underlying condition that is causing the insomnia. Keep a sleep diary to help you and your health care provider figure out what could be causing your insomnia. This information is not intended to replace advice given to you by your health care provider. Make sure you discuss any questions you have with your health care provider. Document Revised: 02/13/2021 Document Reviewed: 02/13/2021 Elsevier Patient Education  2023 Elsevier Inc.  

## 2021-10-05 ENCOUNTER — Encounter: Payer: Self-pay | Admitting: Physician Assistant

## 2021-10-05 ENCOUNTER — Ambulatory Visit (INDEPENDENT_AMBULATORY_CARE_PROVIDER_SITE_OTHER): Payer: Medicare Other | Admitting: Physician Assistant

## 2021-10-05 VITALS — BP 117/76 | HR 66 | Temp 97.7°F | Ht 63.0 in | Wt 182.0 lb

## 2021-10-05 DIAGNOSIS — E785 Hyperlipidemia, unspecified: Secondary | ICD-10-CM

## 2021-10-05 DIAGNOSIS — I10 Essential (primary) hypertension: Secondary | ICD-10-CM

## 2021-10-05 DIAGNOSIS — I63411 Cerebral infarction due to embolism of right middle cerebral artery: Secondary | ICD-10-CM | POA: Diagnosis not present

## 2021-10-05 DIAGNOSIS — E559 Vitamin D deficiency, unspecified: Secondary | ICD-10-CM | POA: Diagnosis not present

## 2021-10-05 DIAGNOSIS — E038 Other specified hypothyroidism: Secondary | ICD-10-CM | POA: Diagnosis not present

## 2021-10-05 DIAGNOSIS — R569 Unspecified convulsions: Secondary | ICD-10-CM

## 2021-10-05 DIAGNOSIS — R7301 Impaired fasting glucose: Secondary | ICD-10-CM

## 2021-10-05 DIAGNOSIS — I480 Paroxysmal atrial fibrillation: Secondary | ICD-10-CM

## 2021-10-05 NOTE — Assessment & Plan Note (Signed)
-  Last A1c 5.9, will repeat today. Will continue to monitor.

## 2021-10-05 NOTE — Progress Notes (Signed)
Established patient visit   Patient: Savannah Hill   DOB: Nov 07, 1950   71 y.o. Female  MRN: [id number] Visit Date: 10/05/2021  Chief Complaint  Patient presents with   Follow-up   Subjective    HPI  Patient presents for chronic follow-up. Patient has no acute concerns. Patient was hospitalized in March 2023 for stroke. Reports was restarted on Eliquis which she has been taking. Currently on 250 mg twice daily for seizure, states possibly had a seizure during stroke. States tiredness has improved some with lower dose of Keppra.   HTN: Pt denies chest pain, palpitations, dizziness or shortness of breath. Reports lower extremity edema has been better. Using compression socks. Taking medication as directed without side effects.   HLD: Pt taking medication as directed without issues. No myalgias or muscle weakness. Patient is fasting for labs.     Medications: Outpatient Medications Prior to Visit  Medication Sig   ALPRAZolam (XANAX) 0.5 MG tablet Take 1 tablet (0.5 mg total) by mouth at bedtime as needed for anxiety.   apixaban (ELIQUIS) 5 MG TABS tablet Take 1 tablet (5 mg total) by mouth 2 (two) times daily.   Cholecalciferol (VITAMIN D3) 125 MCG (5000 UT) CAPS Take 1 capsule by mouth daily.   citalopram (CELEXA) 20 MG tablet Take 1 tablet (20 mg total) by mouth daily.   diltiazem (TIAZAC) 120 MG 24 hr capsule TAKE 2 CAPSULES DAILY, TO  GOAL BLOOD PRESSURE AROUND 130/80   docusate sodium (COLACE) 100 MG capsule Take 100 mg by mouth daily.   furosemide (LASIX) 20 MG tablet TAKE 1 TABLET DAILY   levETIRAcetam (KEPPRA) 500 MG tablet Taking 1/2 tablet by mouth twice daily   Multiple Vitamin (MULTIVITAMIN WITH MINERALS) TABS Take 1 tablet by mouth daily.   rosuvastatin (CRESTOR) 5 MG tablet TAKE 1 TABLET AT BEDTIME   traZODone (DESYREL) 100 MG tablet TAKE 1 TABLET AT BEDTIME ASNEEDED FOR SLEEP   No facility-administered medications prior to visit.    Review of Systems Review  of Systems:  A fourteen system review of systems was performed and found to be positive as per HPI.  Last CBC Lab Results  Component Value Date   WBC 14.3 (H) 05/29/2021   HGB 11.2 (L) 05/29/2021   HCT 32.9 (L) 05/29/2021   MCV 93.7 05/29/2021   MCH 31.9 05/29/2021   RDW 13.7 05/29/2021   PLT 273 40/98/1191   Last metabolic panel Lab Results  Component Value Date   GLUCOSE 103 (H) 05/29/2021   NA 139 05/29/2021   K 4.3 05/29/2021   CL 106 05/29/2021   CO2 26 05/29/2021   BUN 8 05/29/2021   CREATININE 0.90 05/29/2021   GFRNONAA >60 05/29/2021   CALCIUM 8.5 (L) 05/29/2021   PHOS 4.5 05/28/2021   PROT 7.0 05/28/2021   ALBUMIN 4.0 05/28/2021   LABGLOB 2.6 01/31/2021   AGRATIO 1.7 01/31/2021   BILITOT 0.6 05/28/2021   ALKPHOS 60 05/28/2021   AST 24 05/28/2021   ALT 11 05/28/2021   ANIONGAP 7 05/29/2021   Last lipids Lab Results  Component Value Date   CHOL 126 05/29/2021   HDL 51 05/29/2021   LDLCALC 65 05/29/2021   TRIG 52 05/29/2021   CHOLHDL 2.5 05/29/2021   Last hemoglobin A1c Lab Results  Component Value Date   HGBA1C 5.9 (H) 05/29/2021   Last thyroid functions Lab Results  Component Value Date   TSH 3.800 01/31/2021   T3TOTAL 103 05/16/2018   Last vitamin D Lab  Results  Component Value Date   VD25OH 88.8 06/12/2019     Objective    BP 117/76   Pulse 66   Temp 97.7 F (36.5 C)   Ht 5' 3"  (1.6 m)   Wt 182 lb (82.6 kg)   LMP 03/19/1984   SpO2 99%   BMI 32.24 kg/m  BP Readings from Last 3 Encounters:  10/05/21 117/76  08/29/21 114/66  07/17/21 122/78   Wt Readings from Last 3 Encounters:  10/05/21 182 lb (82.6 kg)  08/29/21 182 lb 9.6 oz (82.8 kg)  07/17/21 178 lb 9.6 oz (81 kg)    Physical Exam  General:  Cooperative, in no acute distress, appropriate for stated age.  Neuro:  Alert and oriented,  extra-ocular muscles intact  HEENT:  Normocephalic, atraumatic, neck supple  Skin:  no gross rash, warm, pink. Cardiac:  RRR, S1  S2 Respiratory: CTA B/L  Vascular:  Ext warm, no cyanosis apprec.; cap RF less 2 sec. +edema Psych:  No HI/SI, judgement and insight good, Euthymic mood. Full Affect.   No results found for any visits on 10/05/21.  Assessment & Plan      Problem List Items Addressed This Visit       Cardiovascular and Mediastinum   Essential hypertension (Chronic)    -Controlled. Continue current medication regimen. Will continue to monitor.       Relevant Orders   CBC w/Diff   Comp Met (CMET)   PAF (paroxysmal atrial fibrillation) (HCC)    -Followed by cardiology. -On Cardizem 120 mg BID and Eliquis 5 mg BID.      Stroke (cerebrum) ([place]) - Primary    -Followed by neurology. -Reviewed hospital notes, labs and imaging. -Will continue to work on secondary stroke prevention.        Endocrine   Impaired fasting glucose    -Last A1c 5.9, will repeat today. Will continue to monitor.      Relevant Orders   HgB A1c     Other   Vitamin D deficiency   Relevant Orders   Vitamin D (25 hydroxy)   Hyperlipidemia    -Last lipid panel wnl's, LDL 65 (at goal<70). Will repeat lipid panel and hepatic function. Will continue current medication regimen. Will continue to monitor.      Relevant Orders   CBC w/Diff   Comp Met (CMET)   Lipid Profile   Other Visit Diagnoses     Seizure-like activity ([place])       Subclinical hypothyroidism       Relevant Orders   TSH      Seizure- like activity: -Followed by Neurology. -Recommend to follow neurology recommendations, no driving for 6 months since March 2023 and medication therapy.  -Normal EEG (08/03/2021).   Return for [facility] in 2-4 months ok with [facility] (telehealth).        Lorrene Reid, PA-C  [facility] at [facility] [phone] (phone) [phone] (fax)  [place]

## 2021-10-05 NOTE — Assessment & Plan Note (Addendum)
-  Followed by neurology. -Reviewed hospital notes, labs and imaging. -Will continue to work on secondary stroke prevention.

## 2021-10-05 NOTE — Assessment & Plan Note (Signed)
-  Last lipid panel wnl's, LDL 65 (at goal<70). Will repeat lipid panel and hepatic function. Will continue current medication regimen. Will continue to monitor.

## 2021-10-05 NOTE — Assessment & Plan Note (Signed)
-  Controlled. Continue current medication regimen. Will continue to monitor. 

## 2021-10-05 NOTE — Assessment & Plan Note (Signed)
-  Followed by cardiology. -On Cardizem 120 mg BID and Eliquis 5 mg BID.

## 2021-10-06 LAB — CBC WITH DIFFERENTIAL/PLATELET
Basophils Absolute: 0 10*3/uL (ref 0.0–0.2)
Basos: 1 %
EOS (ABSOLUTE): 0.2 10*3/uL (ref 0.0–0.4)
Eos: 3 %
Hematocrit: 39.2 % (ref 34.0–46.6)
Hemoglobin: 12.7 g/dL (ref 11.1–15.9)
Immature Grans (Abs): 0 10*3/uL (ref 0.0–0.1)
Immature Granulocytes: 0 %
Lymphocytes Absolute: 1.4 10*3/uL (ref 0.7–3.1)
Lymphs: 26 %
MCH: 30.9 pg (ref 26.6–33.0)
MCHC: 32.4 g/dL (ref 31.5–35.7)
MCV: 95 fL (ref 79–97)
Monocytes Absolute: 0.4 10*3/uL (ref 0.1–0.9)
Monocytes: 7 %
Neutrophils Absolute: 3.5 10*3/uL (ref 1.4–7.0)
Neutrophils: 63 %
Platelets: 337 10*3/uL (ref 150–450)
RBC: 4.11 x10E6/uL (ref 3.77–5.28)
RDW: 13 % (ref 11.7–15.4)
WBC: 5.5 10*3/uL (ref 3.4–10.8)

## 2021-10-06 LAB — LIPID PANEL
Chol/HDL Ratio: 2.4 ratio (ref 0.0–4.4)
Cholesterol, Total: 153 mg/dL (ref 100–199)
HDL: 65 mg/dL (ref >39)
LDL Chol Calc (NIH): 71 mg/dL (ref 0–99)
Triglycerides: 95 mg/dL (ref 0–149)
VLDL Cholesterol Cal: 17 mg/dL (ref 5–40)

## 2021-10-06 LAB — HEMOGLOBIN A1C
Est. average glucose Bld gHb Est-mCnc: 120 mg/dL
Hgb A1c MFr Bld: 5.8 % — ABNORMAL HIGH (ref 4.8–5.6)

## 2021-10-06 LAB — COMPREHENSIVE METABOLIC PANEL
ALT: 10 IU/L (ref 0–32)
AST: 18 IU/L (ref 0–40)
Albumin/Globulin Ratio: 1.8 (ref 1.2–2.2)
Albumin: 4.1 g/dL (ref 3.9–4.9)
Alkaline Phosphatase: 75 IU/L (ref 44–121)
BUN/Creatinine Ratio: 18 (ref 12–28)
BUN: 15 mg/dL (ref 8–27)
Bilirubin Total: 0.7 mg/dL (ref 0.0–1.2)
CO2: 28 mmol/L (ref 20–29)
Calcium: 9.6 mg/dL (ref 8.7–10.3)
Chloride: 101 mmol/L (ref 96–106)
Creatinine, Ser: 0.82 mg/dL (ref 0.57–1.00)
Globulin, Total: 2.3 g/dL (ref 1.5–4.5)
Glucose: 85 mg/dL (ref 70–99)
Potassium: 4.5 mmol/L (ref 3.5–5.2)
Sodium: 140 mmol/L (ref 134–144)
Total Protein: 6.4 g/dL (ref 6.0–8.5)
eGFR: 77 mL/min/{1.73_m2} (ref >59)

## 2021-10-06 LAB — VITAMIN D 25 HYDROXY (VIT D DEFICIENCY, FRACTURES): Vit D, 25-Hydroxy: 120 ng/mL — ABNORMAL HIGH (ref 30.0–100.0)

## 2021-10-06 LAB — TSH: TSH: 3.67 u[IU]/mL (ref 0.450–4.500)

## 2021-10-11 ENCOUNTER — Telehealth: Payer: Self-pay | Admitting: Physician Assistant

## 2021-10-11 NOTE — Telephone Encounter (Signed)
Patient wanted a call regarding her labs, she wanted to make sure she understood her lab results correctly & had questions about her vitamin d levels. AMUCK

## 2021-10-12 NOTE — Telephone Encounter (Signed)
Left msg for patient to call back. AS, CMA 

## 2021-10-25 DIAGNOSIS — M25561 Pain in right knee: Secondary | ICD-10-CM | POA: Diagnosis not present

## 2021-10-25 DIAGNOSIS — M7062 Trochanteric bursitis, left hip: Secondary | ICD-10-CM | POA: Diagnosis not present

## 2021-10-26 ENCOUNTER — Other Ambulatory Visit: Payer: Self-pay | Admitting: Physician Assistant

## 2021-10-26 DIAGNOSIS — I890 Lymphedema, not elsewhere classified: Secondary | ICD-10-CM

## 2021-10-30 ENCOUNTER — Other Ambulatory Visit: Payer: Self-pay | Admitting: Physician Assistant

## 2021-10-30 DIAGNOSIS — I10 Essential (primary) hypertension: Secondary | ICD-10-CM

## 2021-10-30 DIAGNOSIS — E785 Hyperlipidemia, unspecified: Secondary | ICD-10-CM

## 2021-10-30 DIAGNOSIS — F5101 Primary insomnia: Secondary | ICD-10-CM

## 2021-10-30 DIAGNOSIS — I480 Paroxysmal atrial fibrillation: Secondary | ICD-10-CM

## 2021-10-30 DIAGNOSIS — I890 Lymphedema, not elsewhere classified: Secondary | ICD-10-CM

## 2021-11-08 ENCOUNTER — Encounter: Payer: Self-pay | Admitting: Adult Health

## 2021-11-08 MED ORDER — LEVETIRACETAM 250 MG PO TABS: 250.0000 mg | ORAL_TABLET | Freq: Two times a day (BID) | ORAL | 1 refills | Status: DC

## 2021-11-24 ENCOUNTER — Telehealth: Payer: Self-pay | Admitting: Physician Assistant

## 2021-11-24 ENCOUNTER — Other Ambulatory Visit: Payer: Self-pay | Admitting: Physician Assistant

## 2021-11-24 DIAGNOSIS — I890 Lymphedema, not elsewhere classified: Secondary | ICD-10-CM

## 2021-11-24 NOTE — Telephone Encounter (Signed)
Patient called requesting a refill for furosemide '20MG'$ . Also requests if the prescription can be changed to 90 days rather than 2 weeks.

## 2021-11-27 NOTE — Telephone Encounter (Signed)
Can you let this patient know that she sees maritza on 12/12/2021 and she can talk about this with her then. In the meantime, she was prescribed another 14 days to help until she gets to her appointment. Thanks  -HB

## 2021-11-27 NOTE — Telephone Encounter (Signed)
Called pt LVM stating that she has an upcoming appt 12/12/2021 and that Rx was called into pharmacy

## 2021-11-30 ENCOUNTER — Other Ambulatory Visit: Payer: Self-pay

## 2021-11-30 DIAGNOSIS — E559 Vitamin D deficiency, unspecified: Secondary | ICD-10-CM

## 2021-12-01 ENCOUNTER — Other Ambulatory Visit: Payer: Medicare Other

## 2021-12-02 DIAGNOSIS — Z23 Encounter for immunization: Secondary | ICD-10-CM | POA: Diagnosis not present

## 2021-12-04 ENCOUNTER — Other Ambulatory Visit: Payer: Medicare Other

## 2021-12-04 DIAGNOSIS — E559 Vitamin D deficiency, unspecified: Secondary | ICD-10-CM

## 2021-12-05 LAB — VITAMIN D 25 HYDROXY (VIT D DEFICIENCY, FRACTURES): Vit D, 25-Hydroxy: 78.2 ng/mL (ref 30.0–100.0)

## 2021-12-07 ENCOUNTER — Encounter: Payer: Medicare Other | Admitting: Physician Assistant

## 2021-12-12 ENCOUNTER — Encounter: Payer: Medicare Other | Admitting: Physician Assistant

## 2021-12-15 ENCOUNTER — Other Ambulatory Visit: Payer: Self-pay | Admitting: Physician Assistant

## 2021-12-15 DIAGNOSIS — I890 Lymphedema, not elsewhere classified: Secondary | ICD-10-CM

## 2021-12-19 ENCOUNTER — Ambulatory Visit (INDEPENDENT_AMBULATORY_CARE_PROVIDER_SITE_OTHER): Payer: Medicare Other | Admitting: Physician Assistant

## 2021-12-19 ENCOUNTER — Encounter: Payer: Self-pay | Admitting: Physician Assistant

## 2021-12-19 VITALS — BP 106/71 | HR 76 | Temp 97.8°F

## 2021-12-19 DIAGNOSIS — Z0000 Encounter for general adult medical examination without abnormal findings: Secondary | ICD-10-CM

## 2021-12-19 NOTE — Progress Notes (Signed)
Subjective:   Savannah Hill is a 71 y.o. female who presents for Medicare Annual (Subsequent) preventive examination.  Review of Systems    Review of Systems: General:   No F/C, wt loss Pulm:   No DIB, SOB, pleuritic chest pain Card:  No CP, palpitations Abd:  No n/v/d or pain Ext:  No inc edema from baseline       Objective:    Today's Vitals   12/19/21 1349  BP: 106/71  Pulse: 76  Temp: 97.8 F (36.6 C)  TempSrc: Temporal   There is no height or weight on file to calculate BMI.     05/28/2021    4:34 AM 02/24/2021    9:32 AM 10/05/2016   10:28 AM 06/07/2016    9:11 AM 10/01/2011    8:16 AM  Advanced Directives  Does Patient Have a Medical Advance Directive? Yes Yes No Yes Patient has advance directive, copy not in chart  Type of Advance Directive Living will   [place] in Chart?    No - copy requested   Would patient like information on creating a medical advance directive?   No - Patient declined    Pre-existing out of facility DNR order (yellow form or pink MOST form)     No    Current Medications (verified) Outpatient Encounter Medications as of 12/19/2021  Medication Sig   ALPRAZolam (XANAX) 0.5 MG tablet Take 1 tablet (0.5 mg total) by mouth at bedtime as needed for anxiety.   apixaban (ELIQUIS) 5 MG TABS tablet Take 1 tablet (5 mg total) by mouth 2 (two) times daily.   Cholecalciferol (VITAMIN D3) 125 MCG (5000 UT) CAPS Take 1 capsule by mouth daily.   citalopram (CELEXA) 20 MG tablet Take 1 tablet (20 mg total) by mouth daily.   diltiazem (TIAZAC) 120 MG 24 hr capsule TAKE 2 CAPSULES DAILY, TO  GOAL BLOOD PRESSURE AROUND 130/80   docusate sodium (COLACE) 100 MG capsule Take 100 mg by mouth daily.   furosemide (LASIX) 20 MG tablet TAKE 1 TABLET DAILY   levETIRAcetam (KEPPRA) 250 MG tablet Take 1 tablet (250 mg total) by mouth 2 (two) times daily.   Multiple  Vitamin (MULTIVITAMIN WITH MINERALS) TABS Take 1 tablet by mouth daily.   rosuvastatin (CRESTOR) 5 MG tablet TAKE 1 TABLET AT BEDTIME   traZODone (DESYREL) 100 MG tablet TAKE 1 TABLET AT BEDTIME ASNEEDED FOR SLEEP   No facility-administered encounter medications on file as of 12/19/2021.    Allergies (verified) Patient has no known allergies.   History: Past Medical History:  Diagnosis Date   Anemia    borderline   Atrial fibrillation ([place])    a. s/p ablation on 10/05/2016   Cancer [facility])    cervical/rad hysterectomy/bso wiht chemo for small cell ca   Dyspnea    Heart valve disorder    HLD (hyperlipidemia)    Hypertension    Joint pain    Lower extremity edema    Obesity    Palpitations    Past Surgical History:  Procedure Laterality Date   ABLATION OF DYSRHYTHMIC FOCUS  10/05/2016   ATRIAL FIBRILLATION ABLATION N/A 10/05/2016   Procedure: Atrial Fibrillation Ablation;  Surgeon: Constance Haw, MD;  Location: [place] CV LAB;  Service: Cardiovascular;  Laterality: N/A;   IR RADIOLOGY PERIPHERAL GUIDED IV START  09/28/2016   IR US GUIDE VASC ACCESS RIGHT  09/28/2016   RADICAL ABDOMINAL HYSTERECTOMY  1986   with BSO   REPLACEMENT TOTAL KNEE Right 2022   TEE WITHOUT CARDIOVERSION N/A 06/07/2016   Procedure: TRANSESOPHAGEAL ECHOCARDIOGRAM (TEE);  Surgeon: Sanda Klein, MD;  Location: [facility] ENDOSCOPY;  Service: Cardiovascular;  Laterality: N/A;   Family History  Problem Relation Age of Onset   Diabetes Mother    Hypertension Mother    Lung cancer Mother    Heart disease Mother    Thyroid disease Mother    Depression Mother    Alcoholism Mother    Skin cancer Father    CVA Father    Diabetes Father    Hypertension Father    Cancer Paternal Grandfather        unknown type   Social History   Socioeconomic History   Marital status: Married    Spouse name: Lanny Donoso   Number of children: 1   Years of education: Not on file   Highest education level:  Not on file  Occupational History   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, [employer]  Tobacco Use   Smoking status: Never   Smokeless tobacco: Never  Vaping Use   Vaping Use: Never used  Substance and Sexual Activity   Alcohol use: Yes    Alcohol/week: 10.0 standard drinks of alcohol    Types: 10 Glasses of wine per week   Drug use: No   Sexual activity: Yes    Partners: Male    Birth control/protection: Surgical  Other Topics Concern   Not on file  Social History Narrative   [place]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   Married   Social Determinants of Health   Financial Resource Strain: Not on file  Food Insecurity: Not on file  Transportation Needs: Not on file  Physical Activity: Not on file  Stress: Not on file  Social Connections: Not on file    Tobacco Counseling Counseling given: Not Answered     Diabetic?no         Activities of Daily Living    12/19/2021    1:59 PM 10/05/2021    8:11 AM  In your present state of health, do you have any difficulty performing the following activities:  Hearing? 0 0  Vision? 0 0  Difficulty concentrating or making decisions? 0 0  Walking or climbing stairs? 1 0  Dressing or bathing? 0 0  Doing errands, shopping? 0 0    Patient Care Team: Lorrene Reid, PA-C as PCP - General (Physician Assistant) Constance Haw, MD as PCP - Cardiology (Cardiology) Megan Salon, MD as Consulting Physician (Gynecology) [facility] as Consulting Physician (Dermatology) Melrose Nakayama, MD as Consulting Physician (Orthopedic Surgery) [facility] (Specialist) Juanita Craver, MD as Consulting Physician (Gastroenterology)  Indicate any recent Medical Services you may have received from other than [facility] providers in the past year (date may be approximate).     Assessment:   This is a routine wellness examination for Savannah Hill.  Hearing/Vision screen No results found.  Dietary  issues and exercise activities discussed: -A heart healthy diet low in fat and carbohydrates. Stays active with daily activities.    Goals Addressed   None   Depression Screen    12/19/2021    1:58 PM 10/05/2021    8:11 AM 02/17/2021    9:39 AM 01/30/2021   10:46 AM 08/18/2020   11:40 AM 07/27/2020    2:35 PM 05/16/2020  3:56 PM  PHQ 2/9 Scores  PHQ - 2 Score 0 0 0 0 0 0 0  PHQ- 9 Score 2 0  0 0 0 1    Fall Risk    12/19/2021    1:58 PM 10/05/2021    8:11 AM 01/30/2021   10:45 AM 08/18/2020   11:41 AM 07/27/2020    2:34 PM  [place] in the past year? 0 0 0 0 0  Number falls in past yr: 0 0 0 0 0  Injury with Fall? 0 0 0 0 0  Risk for fall due to : No Fall Risks No Fall Risks No Fall Risks  No Fall Risks  Follow up Falls evaluation completed Falls evaluation completed Falls evaluation completed Falls evaluation completed Follow up appointment    [place]:  Any stairs in or around the home? No  If so, are there any without handrails? No  Home free of loose throw rugs in walkways, pet beds, electrical cords, etc? No  Adequate lighting in your home to reduce risk of falls? Yes   ASSISTIVE DEVICES UTILIZED TO PREVENT FALLS:  Life alert? No  Use of a cane, walker or w/c? No  Grab bars in the bathroom? No  Shower chair or bench in shower? No  Elevated toilet seat or a handicapped toilet? No   TIMED UP AND GO:  Was the test performed? Yes .  Length of time to ambulate 10 feet: 10 sec.   Gait steady and fast without use of assistive device  Cognitive Function: wnl's        12/19/2021    1:34 PM 07/27/2020    2:36 PM 06/17/2019    1:24 PM  6CIT Screen  What Year? 0 points 0 points 0 points  What month? 0 points 0 points 0 points  What time? 0 points 0 points 0 points  Count back from 20 0 points 0 points 0 points  Months in reverse 0 points 2 points 0 points  Repeat phrase 0 points 0 points 6 points  Total Score 0 points 2  points 6 points    Immunizations Immunization History  Administered Date(s) Administered   Influenza, High Dose Seasonal PF 12/12/2017, 01/14/2019   Influenza-Unspecified 01/04/2020, 01/11/2021   PFIZER(Purple Top)SARS-COV-2 Vaccination 04/11/2019, 05/02/2019, 02/19/2020   Pneumococcal Conjugate-13 12/05/2016   Pneumococcal Polysaccharide-23 10/02/2011, 12/23/2017   Tdap 01/31/2011, 07/27/2020   Zoster Recombinat (Shingrix) 07/07/2019, 02/03/2020    TDAP status: Up to date  Flu Vaccine status: Up to date  Pneumococcal vaccine status: Up to date  Covid-19 vaccine status: Completed vaccines  Qualifies for Shingles Vaccine? Yes   Zostavax completed Yes   Shingrix Completed?: Yes  Screening Tests Health Maintenance  Topic Date Due   COVID-19 Vaccine (4 - Pfizer risk series) 04/15/2020   INFLUENZA VACCINE  10/17/2021   MAMMOGRAM  12/20/2022   COLONOSCOPY (Pts 45-57yr Insurance coverage will need to be confirmed)  11/09/2028   TETANUS/TDAP  07/28/2030   Pneumonia Vaccine 71 Years old  Completed   DEXA SCAN  Completed   Hepatitis C Screening  Completed   Zoster Vaccines- Shingrix  Completed   HPV VACCINES  Aged Out    Health Maintenance  Health Maintenance Due  Topic Date Due   COVID-19 Vaccine (4 - Pfizer risk series) 04/15/2020   INFLUENZA VACCINE  10/17/2021    Colorectal cancer screening: Type of screening: Colonoscopy. Completed 11/10/2018. Repeat every 10 years  Mammogram status: Completed 12/19/2020. Repeat every year. Per pt scheduled next week.  Bone Density status: Completed 12/09/2018. Results reflect: Bone density results: NORMAL. Repeat every 2-3 years.  Lung Cancer Screening: (Low Dose CT Chest recommended if Age 70-80 years, 30 pack-year currently smoking OR have quit w/in 15years.) does not qualify.   Lung Cancer Screening Referral: n/a  Additional Screening:  Hepatitis C Screening: does qualify; Completed deferred.  Vision Screening:  Recommended annual ophthalmology exams for early detection of glaucoma and other disorders of the eye. Is the patient up to date with their annual eye exam?  Yes  Who is the provider or what is the name of the office in which the patient attends annual eye exams? Dr. Simonne Come If pt is not established with a provider, would they like to be referred to a provider to establish care? No .   Dental Screening: Recommended annual dental exams for proper oral hygiene  Community Resource Referral / Chronic Care Management: CRR required this visit?  No   CCM required this visit?  No      Plan:  -Discussed with patient holding statin (rosuvastatin 5 mg) for 4 weeks to evaluate if myalgias and joint pain are adverse effects from medication, advised to give me an update in 4 weeks. -BP is normal. -Continue current medication regimen. -Continue to follow-up with various specialists. -Recommend to follow-up in 6 months for reg OV- HTN, HLD, edema and routine fasting labs.  I have personally reviewed and noted the following in the patient's chart:   Medical and social history Use of alcohol, tobacco or illicit drugs  Current medications and supplements including opioid prescriptions. Patient is not currently taking opioid prescriptions. Functional ability and status Nutritional status Physical activity Advanced directives List of other physicians Hospitalizations, surgeries, and ER visits in previous 12 months Vitals Screenings to include cognitive, depression, and falls Referrals and appointments  In addition, I have reviewed and discussed with patient certain preventive protocols, quality metrics, and best practice recommendations. A written personalized care plan for preventive services as well as general preventive health recommendations were provided to patient.     Lorrene Reid, PA-C   12/19/2021

## 2021-12-19 NOTE — Patient Instructions (Signed)
Preventive Care 65 Years and Older, Female Preventive care refers to lifestyle choices and visits with your health care provider that can promote health and wellness. Preventive care visits are also called wellness exams. What can I expect for my preventive care visit? Counseling Your health care provider may ask you questions about your: Medical history, including: Past medical problems. Family medical history. Pregnancy and menstrual history. History of falls. Current health, including: Memory and ability to understand (cognition). Emotional well-being. Home life and relationship well-being. Sexual activity and sexual health. Lifestyle, including: Alcohol, nicotine or tobacco, and drug use. Access to firearms. Diet, exercise, and sleep habits. Work and work environment. Sunscreen use. Safety issues such as seatbelt and bike helmet use. Physical exam Your health care provider will check your: Height and weight. These may be used to calculate your BMI (body mass index). BMI is a measurement that tells if you are at a healthy weight. Waist circumference. This measures the distance around your waistline. This measurement also tells if you are at a healthy weight and may help predict your risk of certain diseases, such as type 2 diabetes and high blood pressure. Heart rate and blood pressure. Body temperature. Skin for abnormal spots. What immunizations do I need?  Vaccines are usually given at various ages, according to a schedule. Your health care provider will recommend vaccines for you based on your age, medical history, and lifestyle or other factors, such as travel or where you work. What tests do I need? Screening Your health care provider may recommend screening tests for certain conditions. This may include: Lipid and cholesterol levels. Hepatitis C test. Hepatitis B test. HIV (human immunodeficiency virus) test. STI (sexually transmitted infection) testing, if you are at  risk. Lung cancer screening. Colorectal cancer screening. Diabetes screening. This is done by checking your blood sugar (glucose) after you have not eaten for a while (fasting). Mammogram. Talk with your health care provider about how often you should have regular mammograms. BRCA-related cancer screening. This may be done if you have a family history of breast, ovarian, tubal, or peritoneal cancers. Bone density scan. This is done to screen for osteoporosis. Talk with your health care provider about your test results, treatment options, and if necessary, the need for more tests. Follow these instructions at home: Eating and drinking  Eat a diet that includes fresh fruits and vegetables, whole grains, lean protein, and low-fat dairy products. Limit your intake of foods with high amounts of sugar, saturated fats, and salt. Take vitamin and mineral supplements as recommended by your health care provider. Do not drink alcohol if your health care provider tells you not to drink. If you drink alcohol: Limit how much you have to 0-1 drink a day. Know how much alcohol is in your drink. In the [place] one drink equals one 12 oz bottle of beer (355 mL), one 5 oz glass of wine (148 mL), or one 1 oz glass of hard liquor (44 mL). Lifestyle Brush your teeth every morning and night with fluoride toothpaste. Floss one time each day. Exercise for at least 30 minutes 5 or more days each week. Do not use any products that contain nicotine or tobacco. These products include cigarettes, chewing tobacco, and vaping devices, such as e-cigarettes. If you need help quitting, ask your health care provider. Do not use drugs. If you are sexually active, practice safe sex. Use a condom or other form of protection in order to prevent STIs. Take aspirin only as told by   your health care provider. Make sure that you understand how much to take and what form to take. Work with your health care provider to find out whether it  is safe and beneficial for you to take aspirin daily. Ask your health care provider if you need to take a cholesterol-lowering medicine (statin). Find healthy ways to manage stress, such as: Meditation, yoga, or listening to music. Journaling. Talking to a trusted person. Spending time with friends and family. Minimize exposure to UV radiation to reduce your risk of skin cancer. Safety Always wear your seat belt while driving or riding in a vehicle. Do not drive: If you have been drinking alcohol. Do not ride with someone who has been drinking. When you are tired or distracted. While texting. If you have been using any mind-altering substances or drugs. Wear a helmet and other protective equipment during sports activities. If you have firearms in your house, make sure you follow all gun safety procedures. What's next? Visit your health care provider once a year for an annual wellness visit. Ask your health care provider how often you should have your eyes and teeth checked. Stay up to date on all vaccines. This information is not intended to replace advice given to you by your health care provider. Make sure you discuss any questions you have with your health care provider. Document Revised: 08/31/2020 Document Reviewed: 08/31/2020 Elsevier Patient Education  2023 [employer]  

## 2021-12-25 DIAGNOSIS — Z1231 Encounter for screening mammogram for malignant neoplasm of breast: Secondary | ICD-10-CM | POA: Diagnosis not present

## 2022-01-11 DIAGNOSIS — H25043 Posterior subcapsular polar age-related cataract, bilateral: Secondary | ICD-10-CM | POA: Diagnosis not present

## 2022-01-11 DIAGNOSIS — H2513 Age-related nuclear cataract, bilateral: Secondary | ICD-10-CM | POA: Diagnosis not present

## 2022-01-11 DIAGNOSIS — H35413 Lattice degeneration of retina, bilateral: Secondary | ICD-10-CM | POA: Diagnosis not present

## 2022-01-11 DIAGNOSIS — H25013 Cortical age-related cataract, bilateral: Secondary | ICD-10-CM | POA: Diagnosis not present

## 2022-01-12 ENCOUNTER — Other Ambulatory Visit: Payer: Self-pay | Admitting: Adult Health

## 2022-01-18 ENCOUNTER — Encounter (HOSPITAL_BASED_OUTPATIENT_CLINIC_OR_DEPARTMENT_OTHER): Payer: Self-pay | Admitting: *Deleted

## 2022-01-22 ENCOUNTER — Encounter: Payer: Self-pay | Admitting: Adult Health

## 2022-01-22 ENCOUNTER — Ambulatory Visit (INDEPENDENT_AMBULATORY_CARE_PROVIDER_SITE_OTHER): Payer: Medicare Other | Admitting: Adult Health

## 2022-01-22 VITALS — BP 144/89 | HR 64 | Ht 63.0 in | Wt 185.8 lb

## 2022-01-22 DIAGNOSIS — I63411 Cerebral infarction due to embolism of right middle cerebral artery: Secondary | ICD-10-CM | POA: Diagnosis not present

## 2022-01-22 DIAGNOSIS — R569 Unspecified convulsions: Secondary | ICD-10-CM

## 2022-01-22 NOTE — Progress Notes (Signed)
PATIENT: Savannah Hill DOB: November 25, 1950  REASON FOR VISIT: follow up HISTORY FROM: patient PRIMARY NEUROLOGIST: Dr. Leonie Man   Chief Complaint  Patient presents with   Follow-up    Rm 7, husband.  F/u stroke and seizure..  Stable. ? Needing to continue keppra.    HISTORY OF PRESENT ILLNESS: Today 01/22/22:  Savannah Hill is a 71 year old female with a history of right MCA cortical small infarct.  She returns today for follow-up.  She denies any additional strokelike symptoms.  She remains on Eliquis.  Reports that she Has been off rouvastatin for 1 months due to muscle aches. PCP has left and in process of getting another PCP.  She does feel the muscle aches have gotten better.  Patient would like to come off of Keppra.  She is questioning whether she truly had a seizure event or if the symptoms her husband witnessed was related to the stroke.  Her husband states that she was restless in bed.  He found her mumbling and when he rolled her onto her back that is when he noticed that she had a facial droop.  The patient had bit her tongue.   07/17/21: Patient went to the ED after husband noticed kerking in the bed, bit her tongue. Noticed left facial droop and left sided weakness. She did receive TPA.  Stroke- right MCA cortical small infarct secondary to PAF s/p ablation and eliquis d/c'd in 11/2020 Code Stroke CT head No acute abnormality.     MRI 1.2 cm acute ischemic cortical infarct involving the posterior right frontal region.   MRA normal intracranial head and neck MRA 2D Echo EF 55-60% LDL 65 on Rouvastatin HgbA1c 5.9 Now on Eliquis   Possible seizure event- was started on Keppra 500 mg BID in the hospital. Reports that she is fatigued. Would like to come off seizure medication. No other seizure events.   Reports that she has been charting her BP- BP has been running on the low side for her. Has not done any type of therapy- she does not feel that she needs it. Symptoms  have resolved.   REVIEW OF SYSTEMS: Out of a complete 14 system review of symptoms, the patient complains only of the following symptoms, and all other reviewed systems are negative.  ALLERGIES: No Known Allergies  HOME MEDICATIONS: Outpatient Medications Prior to Visit  Medication Sig Dispense Refill   ALPRAZolam (XANAX) 0.5 MG tablet Take 1 tablet (0.5 mg total) by mouth at bedtime as needed for anxiety. 90 tablet 0   apixaban (ELIQUIS) 5 MG TABS tablet Take 1 tablet (5 mg total) by mouth 2 (two) times daily. 180 tablet 3   Cholecalciferol (VITAMIN D3) 125 MCG (5000 UT) CAPS Take 1 capsule by mouth daily.     citalopram (CELEXA) 20 MG tablet Take 1 tablet (20 mg total) by mouth daily. 90 tablet 3   diltiazem (TIAZAC) 120 MG 24 hr capsule TAKE 2 CAPSULES DAILY, TO  GOAL BLOOD PRESSURE AROUND 130/80 180 capsule 0   docusate sodium (COLACE) 100 MG capsule Take 100 mg by mouth daily.     furosemide (LASIX) 20 MG tablet TAKE 1 TABLET DAILY 90 tablet 0   levETIRAcetam (KEPPRA) 250 MG tablet TAKE 1 TABLET BY MOUTH 2 TIMES DAILY. 60 tablet 1   Multiple Vitamin (MULTIVITAMIN WITH MINERALS) TABS Take 1 tablet by mouth daily.     traZODone (DESYREL) 100 MG tablet TAKE 1 TABLET AT BEDTIME ASNEEDED FOR SLEEP 90 tablet 0  rosuvastatin (CRESTOR) 5 MG tablet TAKE 1 TABLET AT BEDTIME (Patient not taking: Reported on 01/22/2022) 90 tablet 0   No facility-administered medications prior to visit.    PAST MEDICAL HISTORY: Past Medical History:  Diagnosis Date   Anemia    borderline   Atrial fibrillation ([place])    a. s/p ablation on 10/05/2016   Cancer ([place])    cervical/rad hysterectomy/bso wiht chemo for small cell ca   Dyspnea    Heart valve disorder    HLD (hyperlipidemia)    Hypertension    Joint pain    Lower extremity edema    Obesity    Palpitations     PAST SURGICAL HISTORY: Past Surgical History:  Procedure Laterality Date   ABLATION OF DYSRHYTHMIC FOCUS  10/05/2016   ATRIAL  FIBRILLATION ABLATION N/A 10/05/2016   Procedure: Atrial Fibrillation Ablation;  Surgeon: Constance Haw, MD;  Location: [facility];  Service: Cardiovascular;  Laterality: N/A;   IR RADIOLOGY PERIPHERAL GUIDED IV START  09/28/2016   IR US GUIDE VASC ACCESS RIGHT  09/28/2016   RADICAL ABDOMINAL HYSTERECTOMY  1986   with BSO   REPLACEMENT TOTAL KNEE Right 2022   TEE WITHOUT CARDIOVERSION N/A 06/07/2016   Procedure: TRANSESOPHAGEAL ECHOCARDIOGRAM (TEE);  Surgeon: Sanda Klein, MD;  Location: [facility];  Service: Cardiovascular;  Laterality: N/A;    FAMILY HISTORY: Family History  Problem Relation Age of Onset   Diabetes Mother    Hypertension Mother    Lung cancer Mother    Heart disease Mother    Thyroid disease Mother    Depression Mother    Alcoholism Mother    Skin cancer Father    CVA Father    Diabetes Father    Hypertension Father    Cancer Paternal Grandfather        unknown type    SOCIAL HISTORY: Social History   Socioeconomic History   Marital status: Married    Spouse name: Raylan Troiani   Number of children: 1   Years of education: Not on file   Highest education level: Not on file  Occupational History   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, [employer]  Tobacco Use   Smoking status: Never   Smokeless tobacco: Never  Vaping Use   Vaping Use: Never used  Substance and Sexual Activity   Alcohol use: Yes    Alcohol/week: 10.0 standard drinks of alcohol    Types: 10 Glasses of wine per week   Drug use: No   Sexual activity: Yes    Partners: Male    Birth control/protection: Surgical  Other Topics Concern   Not on file  Social History Narrative   [place]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   Married   Social Determinants of Health   Financial Resource Strain: Not on file  Food Insecurity: Not on file  Transportation Needs: Not on file  Physical Activity: Not on file  Stress: Not on file  Social  Connections: Not on file  Intimate Partner Violence: Not on file      PHYSICAL EXAM  Vitals:   01/22/22 1039  BP: (!) 144/89  Pulse: 64  Weight: 185 lb 12.8 oz (84.3 kg)  Height: '5\' 3"'$  (1.6 m)   Body mass index is 32.91 kg/m.  Generalized: Well developed, in no acute distress   Neurological examination  Mentation: Alert oriented to time, place, history taking. Follows all commands speech and language fluent  Cranial nerve II-XII: Pupils were equal round reactive to light. Extraocular movements were full, visual field were full on confrontational test. Facial sensation and strength were normal. Head turning and shoulder shrug  were normal and symmetric. Motor: The motor testing reveals 5 over 5 strength of all 4 extremities. Good symmetric motor tone is noted throughout.  Sensory: Sensory testing is intact to soft touch on all 4 extremities. No evidence of extinction is noted.  Coordination: Cerebellar testing reveals good finger-nose-finger and heel-to-shin bilaterally.  Gait and station: Gait is normal.  Reflexes: Deep tendon reflexes are symmetric and normal bilaterally.   DIAGNOSTIC DATA (LABS, IMAGING, TESTING) - I reviewed patient records, labs, notes, testing and imaging myself where available.  Lab Results  Component Value Date   WBC 5.5 10/05/2021   HGB 12.7 10/05/2021   HCT 39.2 10/05/2021   MCV 95 10/05/2021   PLT 337 10/05/2021      Component Value Date/Time   NA 140 10/05/2021 0844   K 4.5 10/05/2021 0844   CL 101 10/05/2021 0844   CO2 28 10/05/2021 0844   GLUCOSE 85 10/05/2021 0844   GLUCOSE 103 (H) 05/29/2021 0643   BUN 15 10/05/2021 0844   CREATININE 0.82 10/05/2021 0844   CREATININE 0.96 12/19/2015 1524   CALCIUM 9.6 10/05/2021 0844   PROT 6.4 10/05/2021 0844   ALBUMIN 4.1 10/05/2021 0844   AST 18 10/05/2021 0844   ALT 10 10/05/2021 0844   ALKPHOS 75 10/05/2021 0844   BILITOT 0.7 10/05/2021 0844   GFRNONAA >60 05/29/2021 0643   GFRAA 71  01/21/2020 0954   Lab Results  Component Value Date   CHOL 153 10/05/2021   HDL 65 10/05/2021   LDLCALC 71 10/05/2021   TRIG 95 10/05/2021   CHOLHDL 2.4 10/05/2021   Lab Results  Component Value Date   HGBA1C 5.8 (H) 10/05/2021   No results found for: "VITAMINB12" Lab Results  Component Value Date   TSH 3.670 10/05/2021      ASSESSMENT AND PLAN 71 y.o. year old female  has a past medical history of Anemia, Atrial fibrillation ([place]), Cancer ([place]), Dyspnea, Heart valve disorder, HLD (hyperlipidemia), Hypertension, Joint pain, Lower extremity edema, Obesity, and Palpitations. here with:  Stroke- right MCA cortical small infarct secondary to PAF s/p ablation and eliquis d/c'd in 11/2020:  - Continue Eliquis - BP goal <130/90 - LDL goal <70 we will recheck lipids today - HbgA1c goal 6.5 %  Seizure like event  -EEGs completed in the hospital and in our office have been normal. -Patient has not had any additional seizure-like events.  She would like to wean off of Keppra.  I did discuss this case with Dr. April Manson.  Because she dose not have a clear diagnosis of seizures the patient does not need to restrict her driving while weaning off medication. - she will decrease dose of Keppra to 250 mg daily for 1 week then stop medication.  - advised if she has any seizure events she should let us know.   FU in 6 months    Ward Givens, MSN, NP-C 01/22/2022, 10:46 AM [facility] [address], [place], [place] [address] [phone]

## 2022-01-23 DIAGNOSIS — Z85828 Personal history of other malignant neoplasm of skin: Secondary | ICD-10-CM | POA: Diagnosis not present

## 2022-01-23 DIAGNOSIS — L821 Other seborrheic keratosis: Secondary | ICD-10-CM | POA: Diagnosis not present

## 2022-01-23 DIAGNOSIS — D225 Melanocytic nevi of trunk: Secondary | ICD-10-CM | POA: Diagnosis not present

## 2022-01-23 DIAGNOSIS — L578 Other skin changes due to chronic exposure to nonionizing radiation: Secondary | ICD-10-CM | POA: Diagnosis not present

## 2022-01-23 DIAGNOSIS — L814 Other melanin hyperpigmentation: Secondary | ICD-10-CM | POA: Diagnosis not present

## 2022-01-23 DIAGNOSIS — L820 Inflamed seborrheic keratosis: Secondary | ICD-10-CM | POA: Diagnosis not present

## 2022-01-23 DIAGNOSIS — L570 Actinic keratosis: Secondary | ICD-10-CM | POA: Diagnosis not present

## 2022-01-24 ENCOUNTER — Other Ambulatory Visit: Payer: Self-pay | Admitting: Neurology

## 2022-01-24 ENCOUNTER — Telehealth: Payer: Self-pay | Admitting: Neurology

## 2022-01-24 LAB — LIPID PANEL
Chol/HDL Ratio: 3.4 ratio (ref 0.0–4.4)
Cholesterol, Total: 244 mg/dL — ABNORMAL HIGH (ref 100–199)
HDL: 71 mg/dL (ref >39)
LDL Chol Calc (NIH): 152 mg/dL — ABNORMAL HIGH (ref 0–99)
Triglycerides: 122 mg/dL (ref 0–149)
VLDL Cholesterol Cal: 21 mg/dL (ref 5–40)

## 2022-01-24 MED ORDER — EZETIMIBE 10 MG PO TABS: 10.0000 mg | ORAL_TABLET | Freq: Every day | ORAL | 0 refills | Status: DC

## 2022-01-24 NOTE — Telephone Encounter (Signed)
Called the pt to advise of the lab results. Informed her of the findings and that Jinny Blossom would recommend she restart a cholesterol medication. She recommends zetia which is not a statin. Advised she strongly recommends establishing with a PCP if she hasn't already and continue monitoring the blood work through PCP. Pt agrees to starting the new medication and asked [facility] be [facility]. Pt verbalized understanding. Pt had no questions at this time but was encouraged to call back if questions arise.

## 2022-01-24 NOTE — Telephone Encounter (Signed)
-----   Message from Ward Givens, NP sent at 01/24/2022  2:42 PM EST ----- Please call patient and let her know that her cholesterol and LDL have increased since she has been off her statin.  I would recommend that we go back on cholesterol medicine but can try Zetia which is not a statin medication.  Also important that she get reestablished with her PCP

## 2022-02-02 ENCOUNTER — Other Ambulatory Visit: Payer: Self-pay | Admitting: Physician Assistant

## 2022-02-02 DIAGNOSIS — I890 Lymphedema, not elsewhere classified: Secondary | ICD-10-CM

## 2022-02-14 ENCOUNTER — Encounter: Payer: Self-pay | Admitting: Family Medicine

## 2022-02-14 ENCOUNTER — Ambulatory Visit (INDEPENDENT_AMBULATORY_CARE_PROVIDER_SITE_OTHER): Payer: Medicare Other | Admitting: Family Medicine

## 2022-02-14 VITALS — BP 118/84 | HR 71 | Temp 97.2°F | Ht 63.0 in | Wt 187.0 lb

## 2022-02-14 DIAGNOSIS — E7800 Pure hypercholesterolemia, unspecified: Secondary | ICD-10-CM | POA: Diagnosis not present

## 2022-02-14 DIAGNOSIS — M791 Myalgia, unspecified site: Secondary | ICD-10-CM

## 2022-02-14 DIAGNOSIS — I639 Cerebral infarction, unspecified: Secondary | ICD-10-CM

## 2022-02-14 DIAGNOSIS — R1084 Generalized abdominal pain: Secondary | ICD-10-CM | POA: Insufficient documentation

## 2022-02-14 LAB — CBC WITH DIFFERENTIAL/PLATELET
Basophils Absolute: 0 10*3/uL (ref 0.0–0.1)
Basophils Relative: 0.7 % (ref 0.0–3.0)
Eosinophils Absolute: 0.1 10*3/uL (ref 0.0–0.7)
Eosinophils Relative: 2.3 % (ref 0.0–5.0)
HCT: 40.3 % (ref 36.0–46.0)
Hemoglobin: 13.5 g/dL (ref 12.0–15.0)
Lymphocytes Relative: 26.4 % (ref 12.0–46.0)
Lymphs Abs: 1.7 10*3/uL (ref 0.7–4.0)
MCHC: 33.5 g/dL (ref 30.0–36.0)
MCV: 95.4 fl (ref 78.0–100.0)
Monocytes Absolute: 0.6 10*3/uL (ref 0.1–1.0)
Monocytes Relative: 9.6 % (ref 3.0–12.0)
Neutro Abs: 3.8 10*3/uL (ref 1.4–7.7)
Neutrophils Relative %: 61 % (ref 43.0–77.0)
Platelets: 346 10*3/uL (ref 150.0–400.0)
RBC: 4.22 Mil/uL (ref 3.87–5.11)
RDW: 13.6 % (ref 11.5–15.5)
WBC: 6.3 10*3/uL (ref 4.0–10.5)

## 2022-02-14 LAB — COMPREHENSIVE METABOLIC PANEL
ALT: 8 U/L (ref 0–35)
AST: 22 U/L (ref 0–37)
Albumin: 4.1 g/dL (ref 3.5–5.2)
Alkaline Phosphatase: 64 U/L (ref 39–117)
BUN: 11 mg/dL (ref 6–23)
CO2: 31 mEq/L (ref 19–32)
Calcium: 9.4 mg/dL (ref 8.4–10.5)
Chloride: 104 mEq/L (ref 96–112)
Creatinine, Ser: 0.84 mg/dL (ref 0.40–1.20)
GFR: 70.06 mL/min (ref >60.00)
Glucose, Bld: 91 mg/dL (ref 70–99)
Potassium: 4.4 mEq/L (ref 3.5–5.1)
Sodium: 142 mEq/L (ref 135–145)
Total Bilirubin: 0.9 mg/dL (ref 0.2–1.2)
Total Protein: 6.6 g/dL (ref 6.0–8.3)

## 2022-02-14 LAB — LIPASE: Lipase: 18 U/L (ref 11.0–59.0)

## 2022-02-14 NOTE — Assessment & Plan Note (Signed)
Chronic.  Labs reviewed in detail.  LDL not at goal less than 70 given recent CVA.  She was unable to tolerate Crestor 5 mg p.o. daily and neuro has now placed her on Zetia which she is tolerating well.  I suggested to her that we recheck in 3 months and if LDL is not at goal we consider a trial of  adding back a lower dose of Crestor plus CoQ enzyme Q10.

## 2022-02-14 NOTE — Progress Notes (Signed)
Patient ID: Savannah Hill, female    DOB: 1950/09/06, 71 y.o.   MRN: [id number]  This visit was conducted in person.  BP 118/84 (BP Location: Left Arm, Patient Position: Sitting, Cuff Size: Normal)   Pulse 71   Temp (!) 97.2 F (36.2 C) (Temporal)   Ht '5\' 3"'$  (1.6 m)   Wt 187 lb (84.8 kg)   LMP 03/19/1984   SpO2 97%   BMI 33.13 kg/m    CC:  Chief Complaint  Patient presents with   Abdominal Pain    Mostly upper but feels it all over x 1 month. Tried taking align but did not help any.     Subjective:   HPI: Savannah Hill is a 71 y.o. female history of hypertension, paroxysmal atrial fibrillation, CVA and obesity presenting on 02/14/2022 for Abdominal Pain (Mostly upper but feels it all over x 1 month. Tried taking align but did not help any. )  PCP Abonza, PA. She would like to tranfer care eventually.  Has TOC appt in May  No recent office visit notes regarding abdominal pain.  She reports new onset  generalized abdominal pain now ongoing x1 month.   Pain is described as a pressure. Pain is intermittent 2-3/10 pain scale.  No specific trigger.[name] Kitchen somewhat worse with eating... was worse. Poor diet.[name] Kitchen eating out, greasy foods... no recent cahnge in diet.  No diarrhea , constipation.. BM soft, daily.  No blood in stool. No change with BM. Occ explosive BM. Increased gas and bloating.    Had body ache in 09/2021... improved off statin.  Placed on ezetimebe by neuro.   Align. Did not help after a few weeks.    Colonoscopy 2015  polyp and 2020 normal.  Relevant past medical, surgical, family and social history reviewed and updated as indicated. Interim medical history since our last visit reviewed. Allergies and medications reviewed and updated. Outpatient Medications Prior to Visit  Medication Sig Dispense Refill   ALPRAZolam (XANAX) 0.5 MG tablet Take 1 tablet (0.5 mg total) by mouth at bedtime as needed for anxiety. 90 tablet 0   apixaban (ELIQUIS) 5 MG  TABS tablet Take 1 tablet (5 mg total) by mouth 2 (two) times daily. 180 tablet 3   Cholecalciferol (VITAMIN D3) 125 MCG (5000 UT) CAPS Take 1 capsule by mouth daily.     citalopram (CELEXA) 20 MG tablet Take 1 tablet (20 mg total) by mouth daily. 90 tablet 3   diltiazem (TIAZAC) 120 MG 24 hr capsule TAKE 2 CAPSULES DAILY, TO  GOAL BLOOD PRESSURE AROUND 130/80 180 capsule 0   docusate sodium (COLACE) 100 MG capsule Take 100 mg by mouth daily.     ezetimibe (ZETIA) 10 MG tablet Take 1 tablet (10 mg total) by mouth daily. 90 tablet 0   furosemide (LASIX) 20 MG tablet TAKE 1 TABLET DAILY 90 tablet 0   levETIRAcetam (KEPPRA) 250 MG tablet TAKE 1 TABLET BY MOUTH 2 TIMES DAILY. 60 tablet 1   Multiple Vitamin (MULTIVITAMIN WITH MINERALS) TABS Take 1 tablet by mouth daily.     rosuvastatin (CRESTOR) 5 MG tablet TAKE 1 TABLET AT BEDTIME 90 tablet 0   traZODone (DESYREL) 100 MG tablet TAKE 1 TABLET AT BEDTIME ASNEEDED FOR SLEEP 90 tablet 0   No facility-administered medications prior to visit.     Per HPI unless specifically indicated in ROS section below Review of Systems  Constitutional:  Negative for fatigue and fever.  HENT:  Negative for congestion.  Eyes:  Negative for pain.  Respiratory:  Negative for cough and shortness of breath.   Cardiovascular:  Negative for chest pain, palpitations and leg swelling.  Gastrointestinal:  Negative for abdominal pain.  Genitourinary:  Negative for dysuria and vaginal bleeding.  Musculoskeletal:  Negative for back pain.  Neurological:  Negative for syncope, light-headedness and headaches.  Psychiatric/Behavioral:  Negative for dysphoric mood.    Objective:  BP 118/84 (BP Location: Left Arm, Patient Position: Sitting, Cuff Size: Normal)   Pulse 71   Temp (!) 97.2 F (36.2 C) (Temporal)   Ht '5\' 3"'$  (1.6 m)   Wt 187 lb (84.8 kg)   LMP 03/19/1984   SpO2 97%   BMI 33.13 kg/m   Wt Readings from Last 3 Encounters:  02/14/22 187 lb (84.8 kg)  01/22/22  185 lb 12.8 oz (84.3 kg)  10/05/21 182 lb (82.6 kg)      Physical Exam Vitals and nursing note reviewed.  Constitutional:      General: She is not in acute distress.    Appearance: Normal appearance. She is well-developed. She is not ill-appearing or toxic-appearing.  HENT:     Head: Normocephalic.     Right Ear: Hearing, tympanic membrane, ear canal and external ear normal.     Left Ear: Hearing, tympanic membrane, ear canal and external ear normal.     Nose: Nose normal.  Eyes:     General: Lids are normal. Lids are everted, no foreign bodies appreciated.     Conjunctiva/sclera: Conjunctivae normal.     Pupils: Pupils are equal, round, and reactive to light.  Neck:     Thyroid: No thyroid mass or thyromegaly.     Vascular: No carotid bruit.     Trachea: Trachea normal.  Cardiovascular:     Rate and Rhythm: Normal rate and regular rhythm.     Heart sounds: Normal heart sounds, S1 normal and S2 normal. No murmur heard.    No gallop.  Pulmonary:     Effort: Pulmonary effort is normal. No respiratory distress.     Breath sounds: Normal breath sounds. No wheezing, rhonchi or rales.  Abdominal:     General: Bowel sounds are increased. There is no distension or abdominal bruit.     Palpations: Abdomen is soft. There is no fluid wave or mass.     Tenderness: There is generalized abdominal tenderness. There is no right CVA tenderness, left CVA tenderness, guarding or rebound.     Hernia: No hernia is present.    Musculoskeletal:     Cervical back: Normal range of motion and neck supple.  Lymphadenopathy:     Cervical: No cervical adenopathy.  Skin:    General: Skin is warm and dry.     Findings: No rash.  Neurological:     Mental Status: She is alert.     Cranial Nerves: No cranial nerve deficit.     Sensory: No sensory deficit.  Psychiatric:        Mood and Affect: Mood is not anxious or depressed.        Speech: Speech normal.        Behavior: Behavior normal. Behavior is  cooperative.        Judgment: Judgment normal.       Results for orders placed or performed in visit on 01/22/22  Lipid Panel  Result Value Ref Range   Cholesterol, Total 244 (H) 100 - 199 mg/dL   Triglycerides 122 0 - 149 mg/dL  HDL 71 >39 mg/dL   VLDL Cholesterol Cal 21 5 - 40 mg/dL   LDL Chol Calc (NIH) 152 (H) 0 - 99 mg/dL   Chol/HDL Ratio 3.4 0.0 - 4.4 ratio     COVID 19 screen:  No recent travel or known exposure to COVID19 The patient denies respiratory symptoms of COVID 19 at this time. The importance of social distancing was discussed today.   Assessment and Plan    Problem List Items Addressed This Visit     Generalized abdominal pain - Primary    Acute, most [place] due to IBS.  No focal epigastric or right upper quadrant pain.  No tenderness over appendix.  No red flags for urgent imaging.  We will evaluate with complete metabolic panel, complete blood count and lipase.  I encouraged her to consider starting fiber, increased water, decrease stress and improve diet to be lower cholesterol lower fat. If pain not improving we can consider further imaging such as ultrasound. Also medication  for pain predominant IBS like Bentyl would be a possible option.      Relevant Orders   Comprehensive metabolic panel   Lipase   CBC with Differential/Platelet   Generalized muscle ache    Significant improvement off of Crestor but patient's cholesterol has worsened dramatically.      Hyperlipidemia    Chronic.  Labs reviewed in detail.  LDL not at goal less than 70 given recent CVA.  She was unable to tolerate Crestor 5 mg p.o. daily and neuro has now placed her on Zetia which she is tolerating well.  I suggested to her that we recheck in 3 months and if LDL is not at goal we consider a trial of  adding back a lower dose of Crestor plus CoQ enzyme Q10.      Relevant Orders   Lipid Panel     Eliezer Lofts, MD

## 2022-02-14 NOTE — Assessment & Plan Note (Signed)
Acute, most [place] due to IBS.  No focal epigastric or right upper quadrant pain.  No tenderness over appendix.  No red flags for urgent imaging.  We will evaluate with complete metabolic panel, complete blood count and lipase.  I encouraged her to consider starting fiber, increased water, decrease stress and improve diet to be lower cholesterol lower fat. If pain not improving we can consider further imaging such as ultrasound. Also medication  for pain predominant IBS like Bentyl would be a possible option.

## 2022-02-14 NOTE — Assessment & Plan Note (Signed)
Significant improvement off of Crestor but patient's cholesterol has worsened dramatically.

## 2022-02-14 NOTE — Patient Instructions (Addendum)
Start daily fiber such as Metamucil or Benefiber.  Increase slowly over time.  Increase fiber containing foods.  Increase water intake. Decrease greasy foods, alcohol.  Work on stress reduction and relaxation techniques. Restart align probiotic daily. We will call with lab results.  Continue Zetia for now and work on low-cholesterol diet, start regular walking.  Reevaluate cholesterol in 3 months with fasting labs prior at  transfer of care office visit

## 2022-02-22 DIAGNOSIS — M25561 Pain in right knee: Secondary | ICD-10-CM | POA: Diagnosis not present

## 2022-04-03 ENCOUNTER — Other Ambulatory Visit (HOSPITAL_COMMUNITY): Payer: Self-pay | Admitting: Orthopedic Surgery

## 2022-04-03 DIAGNOSIS — M25562 Pain in left knee: Secondary | ICD-10-CM

## 2022-04-03 DIAGNOSIS — T8484XA Pain due to internal orthopedic prosthetic devices, implants and grafts, initial encounter: Secondary | ICD-10-CM | POA: Diagnosis not present

## 2022-04-09 ENCOUNTER — Encounter (HOSPITAL_COMMUNITY)
Admission: RE | Admit: 2022-04-09 | Discharge: 2022-04-09 | Disposition: A | Discharge status: Home / Self Care | Payer: Medicare Other | Source: Ambulatory Visit | Attending: Orthopedic Surgery | Admitting: Orthopedic Surgery

## 2022-04-09 DIAGNOSIS — M25562 Pain in left knee: Secondary | ICD-10-CM

## 2022-04-09 MED ORDER — TECHNETIUM TC 99M MEDRONATE IV KIT
20.0000 | PACK | Freq: Once | INTRAVENOUS | Status: AC | PRN
  Administered 2022-04-09: 20 via INTRAVENOUS

## 2022-04-12 ENCOUNTER — Telehealth: Payer: Self-pay | Admitting: Family Medicine

## 2022-04-12 DIAGNOSIS — I480 Paroxysmal atrial fibrillation: Secondary | ICD-10-CM

## 2022-04-12 DIAGNOSIS — I10 Essential (primary) hypertension: Secondary | ICD-10-CM

## 2022-04-12 MED ORDER — DILTIAZEM HCL ER BEADS 120 MG PO CP24: ORAL_CAPSULE | ORAL | 0 refills | Status: DC

## 2022-04-12 NOTE — Telephone Encounter (Signed)
Okay, I must have overlooked that office visit.  I do see that. I will refill the medication as requested.  Please let patient know, sorry for my mistake.Savannah Hill

## 2022-04-12 NOTE — Telephone Encounter (Signed)
Contact patient.  This is a new patient to our practice so I am not comfortable filling prescriptions without having seen the patient.  She needs to contact her previous primary care doctor.  This is a different situation than [facility] transfers.

## 2022-04-12 NOTE — Telephone Encounter (Signed)
Prescription Request  04/12/2022  Is this a "Controlled Substance" medicine? No  LOV: 02/14/2022  What is the name of the medication or equipment? diltiazem (TIAZAC) 120 MG 24 hr capsule   Have you contacted your pharmacy to request a refill? No   Which pharmacy would you like this sent to?  [facility] [place] [facility] [address] [place] [address] Phone: [phone] Fax: [phone]    Patient notified that their request is being sent to the clinical staff for review and that they should receive a response within 2 business days.   Please advise at Mobile [phone] (mobile)

## 2022-04-12 NOTE — Telephone Encounter (Signed)
Last office visit 02/13/22 with Dr. Diona Browner for Abd Pain.  Last refilled 10/30/21 for #180 with no refills by Lorrene Reid, PA-C.   Patient has New Patient Appt with Dr. Diona Browner on 01/15/23.  Refill?

## 2022-04-12 NOTE — Telephone Encounter (Signed)
Savannah Hill notified as instructed by telephone.  She states her previous PCP has left and is no longer in the [facility].  She states she did see Dr. Diona Browner once and thought they had talked about that being a TOC appointment.  Please advise.

## 2022-04-12 NOTE — Telephone Encounter (Signed)
Left message for Savannah Hill that Dr. Diona Browner did go ahead and send in a refill on her Diltiazem to [facility].

## 2022-04-19 DIAGNOSIS — M1712 Unilateral primary osteoarthritis, left knee: Secondary | ICD-10-CM | POA: Diagnosis not present

## 2022-04-19 DIAGNOSIS — Z96651 Presence of right artificial knee joint: Secondary | ICD-10-CM | POA: Diagnosis not present

## 2022-04-19 DIAGNOSIS — M25561 Pain in right knee: Secondary | ICD-10-CM | POA: Diagnosis not present

## 2022-04-20 ENCOUNTER — Other Ambulatory Visit: Payer: Self-pay | Admitting: Nurse Practitioner

## 2022-04-20 ENCOUNTER — Other Ambulatory Visit: Payer: Self-pay | Admitting: *Deleted

## 2022-04-20 DIAGNOSIS — I890 Lymphedema, not elsewhere classified: Secondary | ICD-10-CM

## 2022-04-20 MED ORDER — EZETIMIBE 10 MG PO TABS: 10.0000 mg | ORAL_TABLET | Freq: Every day | ORAL | 0 refills | Status: DC

## 2022-04-20 NOTE — Telephone Encounter (Signed)
Last office visit 02/13/22 with Dr. Diona Browner for Abd Pain.  Last refilled 01/24/22 for #90 with no refills by Ward Givens, NP.   Patient has establish care appt with Dr. Diona Browner on 05/17/22.  Refill?

## 2022-04-23 ENCOUNTER — Other Ambulatory Visit: Payer: Self-pay | Admitting: Family Medicine

## 2022-04-23 ENCOUNTER — Other Ambulatory Visit (HOSPITAL_BASED_OUTPATIENT_CLINIC_OR_DEPARTMENT_OTHER): Payer: Self-pay | Admitting: Obstetrics & Gynecology

## 2022-04-23 DIAGNOSIS — I890 Lymphedema, not elsewhere classified: Secondary | ICD-10-CM

## 2022-04-23 NOTE — Telephone Encounter (Signed)
Last office visit 02/13/22 with Dr. Diona Browner for Abd Pain.  Last refilled 11/20/23or #90 with no refills by Lorrene Reid, PA-C.   Patient has New Patient Appt with Dr. Diona Browner on 01/15/23.  Refill?

## 2022-04-23 NOTE — Telephone Encounter (Signed)
Patient is scheduled for a new patient appointment on 05/17/22. Savannah Hill would like to know if  Dr Diona Browner could fill medication furosemide (LASIX) 20 MG tablet for her? Her doctor that she was currently seeing is no longer at the office she was being seen at.  [facility] [place] [place] to [address] [place] [address] Phone: [phone]  Fax: [phone]

## 2022-04-24 DIAGNOSIS — M1711 Unilateral primary osteoarthritis, right knee: Secondary | ICD-10-CM | POA: Diagnosis not present

## 2022-04-24 DIAGNOSIS — T8484XA Pain due to internal orthopedic prosthetic devices, implants and grafts, initial encounter: Secondary | ICD-10-CM | POA: Diagnosis not present

## 2022-04-24 MED ORDER — FUROSEMIDE 20 MG PO TABS: 20.0000 mg | ORAL_TABLET | Freq: Every day | ORAL | 0 refills | Status: DC

## 2022-04-24 NOTE — Telephone Encounter (Signed)
LMOVM for pt to call regarding refill request 

## 2022-04-26 ENCOUNTER — Encounter (HOSPITAL_COMMUNITY): Payer: Self-pay | Admitting: *Deleted

## 2022-04-26 DIAGNOSIS — Z96651 Presence of right artificial knee joint: Secondary | ICD-10-CM | POA: Diagnosis not present

## 2022-04-26 DIAGNOSIS — M6281 Muscle weakness (generalized): Secondary | ICD-10-CM | POA: Diagnosis not present

## 2022-05-03 ENCOUNTER — Encounter: Payer: Self-pay | Admitting: Family Medicine

## 2022-05-03 ENCOUNTER — Ambulatory Visit (INDEPENDENT_AMBULATORY_CARE_PROVIDER_SITE_OTHER): Payer: Medicare Other | Admitting: Family Medicine

## 2022-05-03 VITALS — BP 120/70 | HR 67 | Temp 97.3°F | Ht 63.0 in | Wt 189.2 lb

## 2022-05-03 DIAGNOSIS — R051 Acute cough: Secondary | ICD-10-CM | POA: Diagnosis not present

## 2022-05-03 DIAGNOSIS — F5101 Primary insomnia: Secondary | ICD-10-CM

## 2022-05-03 LAB — POC COVID19 BINAXNOW: SARS Coronavirus 2 Ag: NEGATIVE

## 2022-05-03 LAB — POC INFLUENZA A&B (BINAX/QUICKVUE)
Influenza A, POC: NEGATIVE
Influenza B, POC: NEGATIVE

## 2022-05-03 MED ORDER — TRAZODONE HCL 100 MG PO TABS: 100.0000 mg | ORAL_TABLET | Freq: Every evening | ORAL | 0 refills | Status: DC | PRN

## 2022-05-03 NOTE — Assessment & Plan Note (Signed)
Acute, most likely viral upper respiratory tract infection.  Negative COVID and flu testing in office today.  Recommend rest fluids and time.  Treat with Mucinex DM twice daily as needed for cough.  Start nasal saline and Flonase 2 sprays per nostril daily for sinusitis symptoms.  Return and ER precautions provided.

## 2022-05-03 NOTE — Progress Notes (Signed)
Patient ID: Savannah Hill, female    DOB: Apr 25, 1950, 72 y.o.   MRN: [id number]  This visit was conducted in person.  BP 120/70   Pulse 67   Temp (!) 97.3 F (36.3 C) (Temporal)   Ht 5' 3"$  (1.6 m)   Wt 189 lb 4 oz (85.8 kg)   LMP 03/19/1984   SpO2 98%   BMI 33.52 kg/m    CC:  Chief Complaint  Patient presents with   Nasal Congestion    Symptoms started last Friday/Saturday   Headache   Scratchy Throat   Cough    Subjective:   HPI: Savannah Hill is a 72 y.o. female with history of hypertension, paroxysmal atrial fibrillation presenting on 05/03/2022 for Nasal Congestion (Symptoms started last Friday/Saturday), Headache, Scratchy Throat, and Cough  She has upcoming appointment to transfer care to me from Dr. Lorelei Pont next week.  Date of onset: 6 to 7 days ago Initial symptoms included scratchy throat, nasal congestion and headache Symptoms progressed to productive cough.  No SOB, some wheeze with lying flat... cough lying down.  NO fever.  No ear pain, but feel full.  No sinus pain, some sinus pressure.   No body aches, but very tired.   Sick contacts:  none COVID testing:   none     She has tried to treat with Claritin.     No history of chronic lung disease such as asthma or COPD. Non-smoker.        Relevant past medical, surgical, family and social history reviewed and updated as indicated. Interim medical history since our last visit reviewed. Allergies and medications reviewed and updated. Outpatient Medications Prior to Visit  Medication Sig Dispense Refill   ALPRAZolam (XANAX) 0.5 MG tablet Take 1 tablet (0.5 mg total) by mouth at bedtime as needed for anxiety. 90 tablet 0   apixaban (ELIQUIS) 5 MG TABS tablet Take 1 tablet (5 mg total) by mouth 2 (two) times daily. 180 tablet 3   Cholecalciferol (VITAMIN D3) 125 MCG (5000 UT) CAPS Take 1 capsule by mouth daily.     citalopram (CELEXA) 20 MG tablet TAKE 1 TABLET DAILY 90 tablet 3    diltiazem (TIAZAC) 120 MG 24 hr capsule TAKE 2 CAPSULES DAILY 180 capsule 0   docusate sodium (COLACE) 100 MG capsule Take 100 mg by mouth daily.     ezetimibe (ZETIA) 10 MG tablet Take 1 tablet (10 mg total) by mouth daily. 90 tablet 0   furosemide (LASIX) 20 MG tablet Take 1 tablet (20 mg total) by mouth daily. 90 tablet 0   Multiple Vitamin (MULTIVITAMIN WITH MINERALS) TABS Take 1 tablet by mouth daily.     traZODone (DESYREL) 100 MG tablet TAKE 1 TABLET AT BEDTIME ASNEEDED FOR SLEEP 90 tablet 0   levETIRAcetam (KEPPRA) 250 MG tablet TAKE 1 TABLET BY MOUTH 2 TIMES DAILY. 60 tablet 1   rosuvastatin (CRESTOR) 5 MG tablet TAKE 1 TABLET AT BEDTIME 90 tablet 0   No facility-administered medications prior to visit.     Per HPI unless specifically indicated in ROS section below Review of Systems  Constitutional:  Negative for fatigue and fever.  HENT:  Positive for congestion and sinus pressure.   Eyes:  Negative for pain.  Respiratory:  Positive for cough. Negative for shortness of breath.   Cardiovascular:  Negative for chest pain, palpitations and leg swelling.  Gastrointestinal:  Negative for abdominal pain.  Genitourinary:  Negative for dysuria and vaginal bleeding.  Musculoskeletal:  Negative for back pain.  Neurological:  Negative for syncope, light-headedness and headaches.  Psychiatric/Behavioral:  Negative for dysphoric mood.    Objective:  BP 120/70   Pulse 67   Temp (!) 97.3 F (36.3 C) (Temporal)   Ht 5' 3"$  (1.6 m)   Wt 189 lb 4 oz (85.8 kg)   LMP 03/19/1984   SpO2 98%   BMI 33.52 kg/m   Wt Readings from Last 3 Encounters:  05/03/22 189 lb 4 oz (85.8 kg)  02/14/22 187 lb (84.8 kg)  01/22/22 185 lb 12.8 oz (84.3 kg)      Physical Exam Constitutional:      General: She is not in acute distress.    Appearance: She is well-developed. She is not ill-appearing or toxic-appearing.  HENT:     Head: Normocephalic.     Right Ear: Hearing, tympanic membrane, ear canal  and external ear normal. Tympanic membrane is not erythematous, retracted or bulging.     Left Ear: Hearing, tympanic membrane, ear canal and external ear normal. Tympanic membrane is not erythematous, retracted or bulging.     Nose: Mucosal edema and rhinorrhea present.     Right Sinus: No maxillary sinus tenderness or frontal sinus tenderness.     Left Sinus: No maxillary sinus tenderness or frontal sinus tenderness.     Mouth/Throat:     Pharynx: Uvula midline.  Eyes:     General: Lids are normal. Lids are everted, no foreign bodies appreciated.     Conjunctiva/sclera: Conjunctivae normal.     Pupils: Pupils are equal, round, and reactive to light.  Neck:     Thyroid: No thyroid mass or thyromegaly.     Vascular: No carotid bruit.     Trachea: Trachea normal.  Cardiovascular:     Rate and Rhythm: Normal rate and regular rhythm.     Pulses: Normal pulses.     Heart sounds: Normal heart sounds, S1 normal and S2 normal. No murmur heard.    No friction rub. No gallop.  Pulmonary:     Effort: Pulmonary effort is normal. No tachypnea or respiratory distress.     Breath sounds: Normal breath sounds. No decreased breath sounds, wheezing, rhonchi or rales.  Musculoskeletal:     Cervical back: Normal range of motion and neck supple.  Skin:    General: Skin is warm and dry.     Findings: No rash.  Neurological:     Mental Status: She is alert.  Psychiatric:        Mood and Affect: Mood is not anxious or depressed.        Speech: Speech normal.        Behavior: Behavior normal. Behavior is cooperative.        Judgment: Judgment normal.       Results for orders placed or performed in visit on 02/14/22  Comprehensive metabolic panel  Result Value Ref Range   Sodium 142 135 - 145 mEq/L   Potassium 4.4 3.5 - 5.1 mEq/L   Chloride 104 96 - 112 mEq/L   CO2 31 19 - 32 mEq/L   Glucose, Bld 91 70 - 99 mg/dL   BUN 11 6 - 23 mg/dL   Creatinine, Ser 0.84 0.40 - 1.20 mg/dL   Total Bilirubin  0.9 0.2 - 1.2 mg/dL   Alkaline Phosphatase 64 39 - 117 U/L   AST 22 0 - 37 U/L   ALT 8 0 - 35 U/L   Total Protein 6.6  6.0 - 8.3 g/dL   Albumin 4.1 3.5 - 5.2 g/dL   GFR 70.06 >60.00 mL/min   Calcium 9.4 8.4 - 10.5 mg/dL  Lipase  Result Value Ref Range   Lipase 18.0 11.0 - 59.0 U/L  CBC with Differential/Platelet  Result Value Ref Range   WBC 6.3 4.0 - 10.5 K/uL   RBC 4.22 3.87 - 5.11 Mil/uL   Hemoglobin 13.5 12.0 - 15.0 g/dL   HCT 40.3 36.0 - 46.0 %   MCV 95.4 78.0 - 100.0 fl   MCHC 33.5 30.0 - 36.0 g/dL   RDW 13.6 11.5 - 15.5 %   Platelets 346.0 150.0 - 400.0 K/uL   Neutrophils Relative % 61.0 43.0 - 77.0 %   Lymphocytes Relative 26.4 12.0 - 46.0 %   Monocytes Relative 9.6 3.0 - 12.0 %   Eosinophils Relative 2.3 0.0 - 5.0 %   Basophils Relative 0.7 0.0 - 3.0 %   Neutro Abs 3.8 1.4 - 7.7 K/uL   Lymphs Abs 1.7 0.7 - 4.0 K/uL   Monocytes Absolute 0.6 0.1 - 1.0 K/uL   Eosinophils Absolute 0.1 0.0 - 0.7 K/uL   Basophils Absolute 0.0 0.0 - 0.1 K/uL    Assessment and Plan  Acute cough Assessment & Plan: Acute, most likely viral upper respiratory tract infection.  Negative COVID and flu testing in office today.  Recommend rest fluids and time.  Treat with Mucinex DM twice daily as needed for cough.  Start nasal saline and Flonase 2 sprays per nostril daily for sinusitis symptoms.  Return and ER precautions provided.  Orders: -     POC COVID-19 BinaxNow -     POC Influenza A&B(BINAX/QUICKVUE)  Primary insomnia -     traZODone HCl; Take 1 tablet (100 mg total) by mouth at bedtime as needed for sleep.  Dispense: 90 tablet; Refill: 0    Return if symptoms worsen or fail to improve.   Eliezer Lofts, MD

## 2022-05-03 NOTE — Patient Instructions (Signed)
Can use Mucinex DM for cough.  Nasal saline spray in nostrils.  Flonase 2 spray per nostril daily.  Call if not improving as expected.  Go to ER for severe shortness of breath.

## 2022-05-07 ENCOUNTER — Encounter: Payer: Self-pay | Admitting: *Deleted

## 2022-05-09 DIAGNOSIS — M6281 Muscle weakness (generalized): Secondary | ICD-10-CM | POA: Diagnosis not present

## 2022-05-09 DIAGNOSIS — Z96651 Presence of right artificial knee joint: Secondary | ICD-10-CM | POA: Diagnosis not present

## 2022-05-15 DIAGNOSIS — M6281 Muscle weakness (generalized): Secondary | ICD-10-CM | POA: Diagnosis not present

## 2022-05-15 DIAGNOSIS — Z96651 Presence of right artificial knee joint: Secondary | ICD-10-CM | POA: Diagnosis not present

## 2022-05-17 ENCOUNTER — Ambulatory Visit (INDEPENDENT_AMBULATORY_CARE_PROVIDER_SITE_OTHER): Payer: Medicare Other | Admitting: Family Medicine

## 2022-05-17 ENCOUNTER — Encounter: Payer: Self-pay | Admitting: Family Medicine

## 2022-05-17 VITALS — BP 120/82 | HR 80 | Temp 97.4°F | Ht 63.0 in | Wt 183.5 lb

## 2022-05-17 DIAGNOSIS — I890 Lymphedema, not elsewhere classified: Secondary | ICD-10-CM

## 2022-05-17 DIAGNOSIS — I480 Paroxysmal atrial fibrillation: Secondary | ICD-10-CM

## 2022-05-17 DIAGNOSIS — E6609 Other obesity due to excess calories: Secondary | ICD-10-CM

## 2022-05-17 DIAGNOSIS — M6281 Muscle weakness (generalized): Secondary | ICD-10-CM | POA: Diagnosis not present

## 2022-05-17 DIAGNOSIS — I10 Essential (primary) hypertension: Secondary | ICD-10-CM | POA: Diagnosis not present

## 2022-05-17 DIAGNOSIS — R7301 Impaired fasting glucose: Secondary | ICD-10-CM

## 2022-05-17 DIAGNOSIS — E785 Hyperlipidemia, unspecified: Secondary | ICD-10-CM

## 2022-05-17 DIAGNOSIS — I059 Rheumatic mitral valve disease, unspecified: Secondary | ICD-10-CM

## 2022-05-17 DIAGNOSIS — I63411 Cerebral infarction due to embolism of right middle cerebral artery: Secondary | ICD-10-CM | POA: Diagnosis not present

## 2022-05-17 DIAGNOSIS — F320 Major depressive disorder, single episode, mild: Secondary | ICD-10-CM

## 2022-05-17 DIAGNOSIS — C531 Malignant neoplasm of exocervix: Secondary | ICD-10-CM

## 2022-05-17 DIAGNOSIS — Z96651 Presence of right artificial knee joint: Secondary | ICD-10-CM | POA: Diagnosis not present

## 2022-05-17 DIAGNOSIS — F1010 Alcohol abuse, uncomplicated: Secondary | ICD-10-CM

## 2022-05-17 DIAGNOSIS — Z6832 Body mass index (BMI) 32.0-32.9, adult: Secondary | ICD-10-CM

## 2022-05-17 NOTE — Assessment & Plan Note (Signed)
Last ECHO 05/2021 EF 55-60%

## 2022-05-17 NOTE — Assessment & Plan Note (Signed)
Stable, chronic.  Continue current medication.   Stable control on celexa  20 mg daily.  Trazodone 100 mg for sleep at night.

## 2022-05-17 NOTE — Assessment & Plan Note (Signed)
Due for repeat A1c at next physical.

## 2022-05-17 NOTE — Assessment & Plan Note (Signed)
She reports she has decrease ETOH intake.. 2 glasses every other

## 2022-05-17 NOTE — Assessment & Plan Note (Signed)
Chronic.  Labs reviewed in detail.  LDL not at goal less than 70 given recent CVA.  She was unable to tolerate Crestor 5 mg p.o. daily and neuro has now placed her on Zetia which she is tolerating well.    Due for repeat evaluation, if LDL not at goal we can consider retrying low-dose of Crestor along with coenzyme Q 10.

## 2022-05-17 NOTE — Assessment & Plan Note (Signed)
Associated with hypertension and impaired glucose, past stroke and paroxysmal atrial fibrillation. She is currently on semaglutide 0.25 mg weekly  Encouraged exercise, weight loss, healthy eating habits.

## 2022-05-17 NOTE — Assessment & Plan Note (Signed)
Stable, chronic.  Continue current medication.  Well-controlled on diltiazem 124 mg extended release 2 capsules daily, Lasix 20 mg daily

## 2022-05-17 NOTE — Assessment & Plan Note (Addendum)
S/P ablation. -Followed by cardiology.. Dr. Curt Bears...  reviewed last OV 07/2021 -On Cardizem 120 mg BID and Eliquis 5 mg BID.

## 2022-05-17 NOTE — Assessment & Plan Note (Signed)
S/P radial hysterectomy and s/p chemo.

## 2022-05-17 NOTE — Progress Notes (Signed)
Patient ID: Savannah Hill, female    DOB: 1951-02-19, 72 y.o.   MRN: [id number]  This visit was conducted in person.  BP 120/82   Pulse 80   Temp (!) 97.4 F (36.3 C) (Temporal)   Ht '5\' 3"'$  (1.6 m)   Wt 183 lb 8 oz (83.2 kg)   LMP 03/19/1984   SpO2 96%   BMI 32.51 kg/m    CC:  Chief Complaint  Patient presents with   Establish Care    Subjective:   HPI: Savannah Hill is a 72 y.o. female presenting on 05/17/2022 for Establish Care   Previous PCP: Savannah Hill Last physical: Over 1 year ago.  I have seen her twice since November 2023.  Notes reviewed.  Seen for generalized abdominal pain and acute cough, now resolved.  Hypertension:   Well-controlled on diltiazem 124 mg extended release 2 capsules daily, Lasix 20 mg daily BP Readings from Last 3 Encounters:  05/17/22 120/82  05/03/22 120/70  02/14/22 118/84  Using medication without problems or lightheadedness:  Chest pain with exertion: Edema: Short of breath: Average home BPs: Other issues:  Followed by cardiology for left anterior fascicular block, mitral valve disease, paroxysmal atrial fibrillation  History of stroke: Followed by neurology  Prediabetes:  Lab Results  Component Value Date   HGBA1C 5.8 (H) 10/05/2021   Obesity, BMI 32.5: On semaglutide 0.25 mg weekly.[name] Kitchen started 1 week ago.   Prescribed  by PA Savannah Hill. Wt Readings from Last 3 Encounters:  05/17/22 183 lb 8 oz (83.2 kg)  05/03/22 189 lb 4 oz (85.8 kg)  02/14/22 187 lb (84.8 kg)     Elevated Cholesterol: On Zetia Lab Results  Component Value Date   CHOL 244 (H) 01/22/2022   HDL 71 01/22/2022   LDLCALC 152 (H) 01/22/2022   TRIG 122 01/22/2022   CHOLHDL 3.4 01/22/2022  Using medications without problems: Muscle aches:  Diet compliance: improving Exercise: working on increasing Other complaints:      Relevant past medical, surgical, family and social history reviewed and updated as indicated. Interim medical history since  our last visit reviewed. Allergies and medications reviewed and updated. Outpatient Medications Prior to Visit  Medication Sig Dispense Refill   ALPRAZolam (XANAX) 0.5 MG tablet Take 1 tablet (0.5 mg total) by mouth at bedtime as needed for anxiety. 90 tablet 0   apixaban (ELIQUIS) 5 MG TABS tablet Take 1 tablet (5 mg total) by mouth 2 (two) times daily. 180 tablet 3   Cholecalciferol (VITAMIN D3) 125 MCG (5000 UT) CAPS Take 1 capsule by mouth daily.     citalopram (CELEXA) 20 MG tablet TAKE 1 TABLET DAILY 90 tablet 3   diltiazem (TIAZAC) 120 MG 24 hr capsule TAKE 2 CAPSULES DAILY 180 capsule 0   docusate sodium (COLACE) 100 MG capsule Take 100 mg by mouth daily.     ezetimibe (ZETIA) 10 MG tablet Take 1 tablet (10 mg total) by mouth daily. 90 tablet 0   furosemide (LASIX) 20 MG tablet Take 1 tablet (20 mg total) by mouth daily. 90 tablet 0   Multiple Vitamin (MULTIVITAMIN WITH MINERALS) TABS Take 1 tablet by mouth daily.     Semaglutide,0.25 or 0.'5MG'$ /DOS, (OZEMPIC, 0.25 OR 0.5 MG/DOSE,) 2 MG/1.5ML SOPN Inject 0.25 mg into the skin once a week.     traZODone (DESYREL) 100 MG tablet Take 1 tablet (100 mg total) by mouth at bedtime as needed for sleep. 90 tablet 0   No facility-administered medications  prior to visit.     Per HPI unless specifically indicated in ROS section below Review of Systems  Constitutional:  Negative for fatigue and fever.  HENT:  Negative for congestion.   Eyes:  Negative for pain.  Respiratory:  Negative for cough and shortness of breath.   Cardiovascular:  Negative for chest pain, palpitations and leg swelling.  Gastrointestinal:  Negative for abdominal pain.  Genitourinary:  Negative for dysuria and vaginal bleeding.  Musculoskeletal:  Negative for back pain.  Neurological:  Negative for syncope, light-headedness and headaches.  Psychiatric/Behavioral:  Negative for dysphoric mood.    Objective:  BP 120/82   Pulse 80   Temp (!) 97.4 F (36.3 C) (Temporal)    Ht '5\' 3"'$  (1.6 m)   Wt 183 lb 8 oz (83.2 kg)   LMP 03/19/1984   SpO2 96%   BMI 32.51 kg/m   Wt Readings from Last 3 Encounters:  05/17/22 183 lb 8 oz (83.2 kg)  05/03/22 189 lb 4 oz (85.8 kg)  02/14/22 187 lb (84.8 kg)      Physical Exam Constitutional:      General: She is not in acute distress.    Appearance: Normal appearance. She is well-developed. She is not ill-appearing or toxic-appearing.  HENT:     Head: Normocephalic.     Right Ear: Hearing, tympanic membrane, ear canal and external ear normal. Tympanic membrane is not erythematous, retracted or bulging.     Left Ear: Hearing, tympanic membrane, ear canal and external ear normal. Tympanic membrane is not erythematous, retracted or bulging.     Nose: No mucosal edema or rhinorrhea.     Right Sinus: No maxillary sinus tenderness or frontal sinus tenderness.     Left Sinus: No maxillary sinus tenderness or frontal sinus tenderness.     Mouth/Throat:     Pharynx: Uvula midline.  Eyes:     General: Lids are normal. Lids are everted, no foreign bodies appreciated.     Conjunctiva/sclera: Conjunctivae normal.     Pupils: Pupils are equal, round, and reactive to light.  Neck:     Thyroid: No thyroid mass or thyromegaly.     Vascular: No carotid bruit.     Trachea: Trachea normal.  Cardiovascular:     Rate and Rhythm: Normal rate and regular rhythm.     Pulses: Normal pulses.     Heart sounds: Normal heart sounds, S1 normal and S2 normal. No murmur heard.    No friction rub. No gallop.  Pulmonary:     Effort: Pulmonary effort is normal. No tachypnea or respiratory distress.     Breath sounds: Normal breath sounds. No decreased breath sounds, wheezing, rhonchi or rales.  Abdominal:     General: Bowel sounds are normal.     Palpations: Abdomen is soft.     Tenderness: There is no abdominal tenderness.  Musculoskeletal:     Cervical back: Normal range of motion and neck supple.  Skin:    General: Skin is warm and dry.      Findings: No rash.  Neurological:     Mental Status: She is alert.  Psychiatric:        Mood and Affect: Mood is not anxious or depressed.        Speech: Speech normal.        Behavior: Behavior normal. Behavior is cooperative.        Thought Content: Thought content normal.        Judgment: Judgment normal.  Results for orders placed or performed in visit on 05/03/22  POC COVID-19  Result Value Ref Range   SARS Coronavirus 2 Ag Negative Negative  POC Influenza A&B (Binax test)  Result Value Ref Range   Influenza A, POC Negative Negative   Influenza B, POC Negative Negative    Assessment and Plan  Essential hypertension Assessment & Plan: Stable, chronic.  Continue current medication.  Well-controlled on diltiazem 124 mg extended release 2 capsules daily, Lasix 20 mg daily     PAF (paroxysmal atrial fibrillation) (HCC) Assessment & Plan:  S/P ablation. -Followed by cardiology.. Dr. Curt Bears...  reviewed last OV 07/2021 -On Cardizem 120 mg BID and Eliquis 5 mg BID.   Cerebrovascular accident (CVA) due to embolism of right middle cerebral artery [facility]) Assessment & Plan: -Followed by neurology... has appt with Ward Givens NP  5/13 -Reviewed hospital notes, labs and imaging. -Will continue to work on secondary stroke prevention.   Impaired fasting glucose Assessment & Plan: Due for repeat A1c at next physical.   Class 1 obesity due to excess calories with serious comorbidity and body mass index (BMI) of 32.0 to 32.9 in adult Assessment & Plan:  Associated with hypertension and impaired glucose, past stroke and paroxysmal atrial fibrillation. She is currently on semaglutide 0.25 mg weekly  Encouraged exercise, weight loss, healthy eating habits.    Hyperlipidemia, unspecified hyperlipidemia type Assessment & Plan: Chronic.  Labs reviewed in detail.  LDL not at goal less than 70 given recent CVA.  She was unable to tolerate Crestor 5 mg p.o. daily  and neuro has now placed her on Zetia which she is tolerating well.    Due for repeat evaluation, if LDL not at goal we can consider retrying low-dose of Crestor along with coenzyme Q 10.   Mitral valve disease Assessment & Plan: Last ECHO 05/2021 EF 55-60%   Malignant neoplasm of exocervix ([place]) Assessment & Plan: S/P radial hysterectomy and s/p chemo.   Current mild episode of major depressive disorder without prior episode [facility]) Assessment & Plan: Stable, chronic.  Continue current medication.   Stable control on celexa  20 mg daily.  Trazodone 100 mg for sleep at night.   Excessive drinking of alcohol Assessment & Plan:  She reports she has decrease ETOH intake.. 2 glasses every other   Lymphedema Assessment & Plan:  Secondary to hysterectomy.. right leg.  Wears compression hose.     Return in about 3 months (around 08/15/2022) for  lab only for cholesterol and A1C,  AND  in 12/2022 AMW and physical..   Eliezer Lofts, MD

## 2022-05-17 NOTE — Assessment & Plan Note (Signed)
Secondary to hysterectomy.. right leg.  Wears compression hose.

## 2022-05-17 NOTE — Assessment & Plan Note (Addendum)
-  Followed by neurology... has appt with Ward Givens NP  5/13 -Reviewed hospital notes, labs and imaging. -Will continue to work on secondary stroke prevention.

## 2022-05-21 DIAGNOSIS — M6281 Muscle weakness (generalized): Secondary | ICD-10-CM | POA: Diagnosis not present

## 2022-05-21 DIAGNOSIS — Z96651 Presence of right artificial knee joint: Secondary | ICD-10-CM | POA: Diagnosis not present

## 2022-05-23 DIAGNOSIS — Z96651 Presence of right artificial knee joint: Secondary | ICD-10-CM | POA: Diagnosis not present

## 2022-05-23 DIAGNOSIS — M6281 Muscle weakness (generalized): Secondary | ICD-10-CM | POA: Diagnosis not present

## 2022-05-23 DIAGNOSIS — M25561 Pain in right knee: Secondary | ICD-10-CM | POA: Diagnosis not present

## 2022-07-01 ENCOUNTER — Other Ambulatory Visit: Payer: Self-pay | Admitting: Cardiology

## 2022-07-01 DIAGNOSIS — I480 Paroxysmal atrial fibrillation: Secondary | ICD-10-CM

## 2022-07-02 NOTE — Telephone Encounter (Signed)
Eliquis 5mg  refill request received. Patient is 72 years old, weight-83.2kg, Crea-0.84 on 02/14/22, Diagnosis-Afib, and last seen by Rudi Coco on 08/29/21. Dose is appropriate based on dosing criteria. Will send in refill to requested pharmacy.

## 2022-07-12 ENCOUNTER — Other Ambulatory Visit: Payer: Self-pay | Admitting: Family Medicine

## 2022-07-12 DIAGNOSIS — I480 Paroxysmal atrial fibrillation: Secondary | ICD-10-CM

## 2022-07-12 DIAGNOSIS — I10 Essential (primary) hypertension: Secondary | ICD-10-CM

## 2022-07-26 DIAGNOSIS — M7051 Other bursitis of knee, right knee: Secondary | ICD-10-CM | POA: Diagnosis not present

## 2022-07-26 DIAGNOSIS — M7062 Trochanteric bursitis, left hip: Secondary | ICD-10-CM | POA: Diagnosis not present

## 2022-07-30 ENCOUNTER — Encounter: Payer: Self-pay | Admitting: Adult Health

## 2022-07-30 ENCOUNTER — Ambulatory Visit (INDEPENDENT_AMBULATORY_CARE_PROVIDER_SITE_OTHER): Payer: Medicare Other | Admitting: Adult Health

## 2022-07-30 VITALS — BP 115/78 | HR 67 | Ht 63.0 in | Wt 174.0 lb

## 2022-07-30 DIAGNOSIS — I63411 Cerebral infarction due to embolism of right middle cerebral artery: Secondary | ICD-10-CM

## 2022-07-30 NOTE — Progress Notes (Signed)
PATIENT: Savannah Hill DOB: 1950/08/11  REASON FOR VISIT: follow up HISTORY FROM: patient PRIMARY NEUROLOGIST: Dr. Pearlean Brownie   Chief Complaint  Patient presents with   Follow-up    Rm 8, alone.  Doing well, no concerns. No sz like activity.  Hoping to be discharged after this visit.     HISTORY OF PRESENT ILLNESS:  Today 07/30/22:  Savannah Hill is a 72 y.o. female with a history of right MCA cortical small infarct. Returns today for follow-up.  She denies any strokelike symptoms.  Reports that she has not had any seizure-like episodes since she stopped Keppra.  She also reports that she Stopped cholesterol medicine d/t side effects. She is now watching her diet and has lost 15-16 lbs. Will see PCP in a couple of weeks to have Lipids checked.  Continues on Eliquis.  She returns today for evaluation.   HISTORY   01/22/22: Savannah Hill is a 72 year old female with a history of right MCA cortical small infarct.  She returns today for follow-up.  She denies any additional strokelike symptoms.  She remains on Eliquis.  Reports that she Has been off rouvastatin for 1 months due to muscle aches. PCP has left and in process of getting another PCP.  She does feel the muscle aches have gotten better.  Patient would like to come off of Keppra.  She is questioning whether she truly had a seizure event or if the symptoms her husband witnessed was related to the stroke.  Her husband states that she was restless in bed.  He found her mumbling and when he rolled her onto her back that is when he noticed that she had a facial droop.  The patient had bit her tongue.   07/17/21: Patient went to the ED after husband noticed kerking in the bed, bit her tongue. Noticed left facial droop and left sided weakness. She did receive TPA.  Stroke- right MCA cortical small infarct secondary to PAF s/p ablation and eliquis d/c'd in 11/2020 Code Stroke CT head No acute abnormality.     MRI 1.2 cm acute  ischemic cortical infarct involving the posterior right frontal region.   MRA normal intracranial head and neck MRA 2D Echo EF 55-60% LDL 65 on Rouvastatin HgbA1c 5.9 Now on Eliquis   Possible seizure event- was started on Keppra 500 mg BID in the hospital. Reports that she is fatigued. Would like to come off seizure medication. No other seizure events.   Reports that she has been charting her BP- BP has been running on the low side for her. Has not done any type of therapy- she does not feel that she needs it. Symptoms have resolved.   REVIEW OF SYSTEMS: Out of a complete 14 system review of symptoms, the patient complains only of the following symptoms, and all other reviewed systems are negative.  ALLERGIES: No Known Allergies  HOME MEDICATIONS: Outpatient Medications Prior to Visit  Medication Sig Dispense Refill   ALPRAZolam (XANAX) 0.5 MG tablet Take 1 tablet (0.5 mg total) by mouth at bedtime as needed for anxiety. 90 tablet 0   apixaban (ELIQUIS) 5 MG TABS tablet TAKE 1 TABLET TWICE A DAY 180 tablet 1   Cholecalciferol (VITAMIN D3) 125 MCG (5000 UT) CAPS Take 1 capsule by mouth daily.     citalopram (CELEXA) 20 MG tablet TAKE 1 TABLET DAILY 90 tablet 3   diltiazem (TIAZAC) 120 MG 24 hr capsule TAKE 2 CAPSULES DAILY 180 capsule 1  docusate sodium (COLACE) 100 MG capsule Take 100 mg by mouth daily.     furosemide (LASIX) 20 MG tablet Take 1 tablet (20 mg total) by mouth daily. 90 tablet 0   Multiple Vitamin (MULTIVITAMIN WITH MINERALS) TABS Take 1 tablet by mouth daily.     Semaglutide,0.25 or 0.5MG /DOS, (OZEMPIC, 0.25 OR 0.5 MG/DOSE,) 2 MG/1.5ML SOPN Inject 0.25 mg into the skin once a week.     traZODone (DESYREL) 100 MG tablet Take 1 tablet (100 mg total) by mouth at bedtime as needed for sleep. 90 tablet 0   ezetimibe (ZETIA) 10 MG tablet Take 1 tablet (10 mg total) by mouth daily. (Patient not taking: Reported on 07/30/2022) 90 tablet 0   No facility-administered medications  prior to visit.    PAST MEDICAL HISTORY: Past Medical History:  Diagnosis Date   Anemia    borderline   Atrial fibrillation (HCC)    a. s/p ablation on 10/05/2016   Cancer (HCC)    cervical/rad hysterectomy/bso wiht chemo for small cell ca   Dyspnea    Heart valve disorder    HLD (hyperlipidemia)    Hypertension    Joint pain    Lower extremity edema    Obesity    Palpitations     PAST SURGICAL HISTORY: Past Surgical History:  Procedure Laterality Date   ABLATION OF DYSRHYTHMIC FOCUS  10/05/2016   ATRIAL FIBRILLATION ABLATION N/A 10/05/2016   Procedure: Atrial Fibrillation Ablation;  Surgeon: Regan Lemming, MD;  Location: [facility] INVASIVE CV LAB;  Service: Cardiovascular;  Laterality: N/A;   IR RADIOLOGY PERIPHERAL GUIDED IV START  09/28/2016   IR US GUIDE VASC ACCESS RIGHT  09/28/2016   RADICAL ABDOMINAL HYSTERECTOMY  1986   with BSO   REPLACEMENT TOTAL KNEE Right 2022   TEE WITHOUT CARDIOVERSION N/A 06/07/2016   Procedure: TRANSESOPHAGEAL ECHOCARDIOGRAM (TEE);  Surgeon: Thurmon Fair, MD;  Location: [facility] ENDOSCOPY;  Service: Cardiovascular;  Laterality: N/A;    FAMILY HISTORY: Family History  Problem Relation Age of Onset   Diabetes Mother    Hypertension Mother    Heart disease Mother    Thyroid disease Mother    Depression Mother    Alcoholism Mother    Skin cancer Father    CVA Father    Diabetes Father    Hypertension Father    Cancer Paternal Grandfather        unknown type    SOCIAL HISTORY: Social History   Socioeconomic History   Marital status: Married    Spouse name: Savannah Hill   Number of children: 1   Years of education: Not on file   Highest education level: Not on file  Occupational History   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   Married          Son in [place]   Social Determinants of Corporate investment banker  Strain: Not on file  Food Insecurity: Not on file  Transportation Needs: Not on file  Physical Activity: Not on file  Stress: Not on file  Social Connections: Not on file  Intimate Partner Violence: Not on file      PHYSICAL EXAM  Vitals:   07/30/22 0959  BP: 115/78  Pulse: 67  Weight: 174 lb (78.9 kg)  Height: 5\' 3"  (1.6 m)   Body mass index is 30.82 kg/m.  Generalized: Well developed, in no acute distress   Neurological examination  Mentation: Alert oriented to time, place, history taking. Follows all commands speech and language fluent Cranial nerve II-XII: Pupils were equal round reactive to light. Extraocular movements were full, visual field were full on confrontational test. Facial sensation and strength were normal. Head turning and shoulder shrug  were normal and symmetric. Motor: The motor testing reveals 5 over 5 strength of all 4 extremities. Good symmetric motor tone is noted throughout.  Sensory: Sensory testing is intact to soft touch on all 4 extremities. No evidence of extinction is noted.  Coordination: Cerebellar testing reveals good finger-nose-finger and heel-to-shin bilaterally.  Gait and station: Gait is normal.    DIAGNOSTIC DATA (LABS, IMAGING, TESTING) - I reviewed patient records, labs, notes, testing and imaging myself where available.  Lab Results  Component Value Date   WBC 6.3 02/14/2022   HGB 13.5 02/14/2022   HCT 40.3 02/14/2022   MCV 95.4 02/14/2022   PLT 346.0 02/14/2022      Component Value Date/Time   NA 142 02/14/2022 0953   NA 140  10/05/2021 0844   K 4.4 02/14/2022 0953   CL 104 02/14/2022 0953   CO2 31 02/14/2022 0953   GLUCOSE 91 02/14/2022 0953   BUN 11 02/14/2022 0953   BUN 15 10/05/2021 0844   CREATININE 0.84 02/14/2022 0953   CREATININE 0.96 12/19/2015 1524   CALCIUM 9.4 02/14/2022 0953   PROT 6.6 02/14/2022 0953   PROT 6.4 10/05/2021 0844   ALBUMIN 4.1 02/14/2022 0953   ALBUMIN 4.1 10/05/2021 0844   AST 22 02/14/2022 0953   ALT 8 02/14/2022 0953   ALKPHOS 64 02/14/2022 0953   BILITOT 0.9 02/14/2022 0953   BILITOT 0.7 10/05/2021 0844   GFRNONAA >60 05/29/2021 0643   GFRAA 71 01/21/2020 0954   Lab Results  Component Value Date   CHOL 244 (H) 01/22/2022   HDL 71 01/22/2022   LDLCALC 152 (H) 01/22/2022   TRIG 122 01/22/2022   CHOLHDL 3.4 01/22/2022   Lab Results  Component Value Date   HGBA1C 5.8 (H) 10/05/2021   No results found for: "VITAMINB12" Lab Results  Component Value Date   TSH 3.670 10/05/2021      ASSESSMENT AND PLAN 72 y.o. year old female  has a past medical history of Anemia, Atrial fibrillation (HCC), Cancer (HCC), Dyspnea, Heart valve disorder, HLD (hyperlipidemia), Hypertension, Joint pain, Lower extremity edema, Obesity, and Palpitations. here with:  Stroke- right MCA cortical small infarct secondary to PAF s/p ablation and eliquis d/c'd in 11/2020 Seizure like event   - Continue Eliquis - BP goal <130/90 - LDL goal <70 -stopped cholesterol medicine due to side effects.  PCP will recheck at next visit.  Did advise patient that if her cholesterol remains elevated she will need to consider medication or over-the-counter supplement. - HbgA1c goal 6.5 % -Advised if symptoms worsen or she develops new symptoms she should let us know -Follow-up as needed     Butch Penny, MSN, NP-C  07/30/2022, 10:09 AM [facility] [address] [place], [place] [address] [phone]

## 2022-08-15 ENCOUNTER — Other Ambulatory Visit (INDEPENDENT_AMBULATORY_CARE_PROVIDER_SITE_OTHER): Payer: Medicare Other

## 2022-08-15 DIAGNOSIS — E7800 Pure hypercholesterolemia, unspecified: Secondary | ICD-10-CM | POA: Diagnosis not present

## 2022-08-15 LAB — LIPID PANEL
Cholesterol: 156 mg/dL (ref 0–200)
HDL: 55.6 mg/dL (ref >39.00)
LDL Cholesterol: 88 mg/dL (ref 0–99)
NonHDL: 100.44
Total CHOL/HDL Ratio: 3
Triglycerides: 63 mg/dL (ref 0.0–149.0)
VLDL: 12.6 mg/dL (ref 0.0–40.0)

## 2022-08-20 ENCOUNTER — Other Ambulatory Visit: Payer: Self-pay | Admitting: Family Medicine

## 2022-08-20 DIAGNOSIS — I890 Lymphedema, not elsewhere classified: Secondary | ICD-10-CM

## 2022-08-23 ENCOUNTER — Ambulatory Visit: Payer: Medicare Other | Admitting: Cardiology

## 2022-08-27 ENCOUNTER — Encounter: Payer: Self-pay | Admitting: Family Medicine

## 2022-08-28 ENCOUNTER — Ambulatory Visit: Payer: Medicare Other | Admitting: Family Medicine

## 2022-08-29 ENCOUNTER — Ambulatory Visit: Payer: Medicare Other | Attending: Cardiology | Admitting: Cardiology

## 2022-08-29 ENCOUNTER — Encounter: Payer: Self-pay | Admitting: Cardiology

## 2022-08-29 VITALS — BP 112/70 | HR 70 | Ht 63.0 in | Wt 171.0 lb

## 2022-08-29 DIAGNOSIS — I10 Essential (primary) hypertension: Secondary | ICD-10-CM | POA: Diagnosis not present

## 2022-08-29 DIAGNOSIS — I480 Paroxysmal atrial fibrillation: Secondary | ICD-10-CM

## 2022-08-29 DIAGNOSIS — D6869 Other thrombophilia: Secondary | ICD-10-CM | POA: Diagnosis not present

## 2022-08-29 NOTE — Progress Notes (Signed)
  Electrophysiology Office Note:   Date:  08/29/2022  ID:  Savannah Hill, DOB 12/12/1950, MRN [id number]  Primary Cardiologist: Jacobus Colvin Jorja Loa, MD Electrophysiologist: None      History of Present Illness:   Savannah Hill is a 72 y.o. female with h/o atrial fibrillation, hypertension, PVCs, CVA seen today for routine electrophysiology followup.  Since last being seen in our clinic the patient reports doing.  She is lost up to 20 pounds.  She has been on Ozempic.  She has noted no further episodes of atrial fibrillation.  She has had short palpitations that last for a few seconds.  She does drink alcohol, a few drinks a night at times.  she denies chest pain, palpitations, dyspnea, PND, orthopnea, nausea, vomiting, dizziness, syncope, edema, weight gain, or early satiety.   Review of systems complete and found to be negative unless listed in HPI.   Studies Reviewed:    EKG is ordered today. Personal review shows sinus rhythm, first-degree AV block, LVH   Risk Assessment/Calculations:    CHA2DS2-VASc Score = 5   This indicates a 7.2% annual risk of stroke. The patient's score is based upon: CHF History: 0 HTN History: 1 Diabetes History: 0 Stroke History: 2 Vascular Disease History: 0 Age Score: 1 Gender Score: 1             Physical Exam:   VS:  BP 112/70   Pulse 70   Ht 5\' 3"  (1.6 m)   Wt 171 lb (77.6 kg)   LMP 03/19/1984   SpO2 96%   BMI 30.29 kg/m    Wt Readings from Last 3 Encounters:  08/29/22 171 lb (77.6 kg)  07/30/22 174 lb (78.9 kg)  05/17/22 183 lb 8 oz (83.2 kg)     GEN: Well nourished, well developed in no acute distress NECK: No JVD; No carotid bruits CARDIAC: Regular rate and rhythm, no murmurs, rubs, gallops RESPIRATORY:  Clear to auscultation without rales, wheezing or rhonchi  ABDOMEN: Soft, non-tender, non-distended EXTREMITIES:  No edema; No deformity   ASSESSMENT AND PLAN:    1.  Paroxysmal atrial fibrillation: Status post  ablation 10/05/2016.  Currently on Eliquis.  She had a stroke when Eliquis was stopped.  She has not had any further episodes of atrial fibrillation.  Savannah Hill continue with current management.  2.  PVCs: None noted today  3.  Hypertension: Currently well-controlled  4.  Moderate mitral regurgitation: Has remained stable  5.  Cryptogenic stroke: Occurred off of Eliquis.  Currently on Keppra.  Has almost completely recovered.  6.  Secondary hypercoagulable state: Currently on Eliquis for atrial fibrillation  Follow up with EP APP in 12 months  Signed, Jaideep Pollack Jorja Loa, MD

## 2022-08-30 MED ORDER — ROSUVASTATIN CALCIUM 5 MG PO TABS: 5.0000 mg | ORAL_TABLET | Freq: Every day | ORAL | 3 refills | Status: DC

## 2022-09-15 ENCOUNTER — Encounter: Payer: Self-pay | Admitting: Family Medicine

## 2022-10-10 ENCOUNTER — Encounter: Payer: Self-pay | Admitting: Cardiology

## 2022-10-16 ENCOUNTER — Ambulatory Visit: Payer: Medicare Other | Admitting: Cardiology

## 2022-10-24 ENCOUNTER — Other Ambulatory Visit: Payer: Self-pay | Admitting: Family Medicine

## 2022-10-24 DIAGNOSIS — F5101 Primary insomnia: Secondary | ICD-10-CM

## 2022-11-02 ENCOUNTER — Encounter: Payer: Self-pay | Admitting: Family Medicine

## 2022-12-01 ENCOUNTER — Other Ambulatory Visit: Payer: Self-pay | Admitting: Family Medicine

## 2022-12-01 DIAGNOSIS — I890 Lymphedema, not elsewhere classified: Secondary | ICD-10-CM

## 2022-12-03 DIAGNOSIS — Z23 Encounter for immunization: Secondary | ICD-10-CM | POA: Diagnosis not present

## 2022-12-03 NOTE — Telephone Encounter (Signed)
Patient scheduled.

## 2022-12-03 NOTE — Telephone Encounter (Signed)
Please schedule Medicare wellness with Savannah Hill and CPE with fasting labs prior with Dr. Ermalene Searing for October 2024.

## 2022-12-21 ENCOUNTER — Other Ambulatory Visit: Payer: Self-pay | Admitting: Cardiology

## 2022-12-21 DIAGNOSIS — I480 Paroxysmal atrial fibrillation: Secondary | ICD-10-CM

## 2022-12-21 NOTE — Telephone Encounter (Signed)
Prescription refill request for Eliquis received. Indication:afib Last office visit:6/24 Scr:0.84  11/23 Age: 72 Weight:77.6  kg  Prescription refilled

## 2022-12-25 DIAGNOSIS — M7052 Other bursitis of knee, left knee: Secondary | ICD-10-CM | POA: Diagnosis not present

## 2022-12-25 DIAGNOSIS — M7051 Other bursitis of knee, right knee: Secondary | ICD-10-CM | POA: Diagnosis not present

## 2022-12-26 ENCOUNTER — Other Ambulatory Visit (HOSPITAL_COMMUNITY): Payer: Self-pay | Admitting: Orthopedic Surgery

## 2022-12-26 DIAGNOSIS — M25561 Pain in right knee: Secondary | ICD-10-CM

## 2023-01-01 ENCOUNTER — Telehealth: Payer: Self-pay | Admitting: *Deleted

## 2023-01-01 DIAGNOSIS — E559 Vitamin D deficiency, unspecified: Secondary | ICD-10-CM

## 2023-01-01 DIAGNOSIS — E785 Hyperlipidemia, unspecified: Secondary | ICD-10-CM

## 2023-01-01 DIAGNOSIS — R7303 Prediabetes: Secondary | ICD-10-CM

## 2023-01-01 NOTE — Telephone Encounter (Signed)
-----   Message from Alvina Chou sent at 12/28/2022 11:26 AM EDT ----- Regarding: Lab orders for Mon, 10.28.24 Patient is scheduled for CPX labs, please order future labs, Thanks , Camelia Eng

## 2023-01-14 ENCOUNTER — Other Ambulatory Visit (INDEPENDENT_AMBULATORY_CARE_PROVIDER_SITE_OTHER): Payer: Medicare Other

## 2023-01-14 ENCOUNTER — Ambulatory Visit (INDEPENDENT_AMBULATORY_CARE_PROVIDER_SITE_OTHER): Payer: Medicare Other

## 2023-01-14 VITALS — Wt 171.0 lb

## 2023-01-14 DIAGNOSIS — Z0000 Encounter for general adult medical examination without abnormal findings: Secondary | ICD-10-CM | POA: Diagnosis not present

## 2023-01-14 DIAGNOSIS — E559 Vitamin D deficiency, unspecified: Secondary | ICD-10-CM | POA: Diagnosis not present

## 2023-01-14 DIAGNOSIS — R7303 Prediabetes: Secondary | ICD-10-CM

## 2023-01-14 DIAGNOSIS — E785 Hyperlipidemia, unspecified: Secondary | ICD-10-CM | POA: Diagnosis not present

## 2023-01-14 LAB — LIPID PANEL
Cholesterol: 180 mg/dL (ref 0–200)
HDL: 72.1 mg/dL (ref >39.00)
LDL Cholesterol: 92 mg/dL (ref 0–99)
NonHDL: 108.05
Total CHOL/HDL Ratio: 2
Triglycerides: 82 mg/dL (ref 0.0–149.0)
VLDL: 16.4 mg/dL (ref 0.0–40.0)

## 2023-01-14 LAB — COMPREHENSIVE METABOLIC PANEL
ALT: 15 U/L (ref 0–35)
AST: 31 U/L (ref 0–37)
Albumin: 4 g/dL (ref 3.5–5.2)
Alkaline Phosphatase: 65 U/L (ref 39–117)
BUN: 9 mg/dL (ref 6–23)
CO2: 29 meq/L (ref 19–32)
Calcium: 9.4 mg/dL (ref 8.4–10.5)
Chloride: 101 meq/L (ref 96–112)
Creatinine, Ser: 0.88 mg/dL (ref 0.40–1.20)
GFR: 65.83 mL/min (ref >60.00)
Glucose, Bld: 94 mg/dL (ref 70–99)
Potassium: 4.2 meq/L (ref 3.5–5.1)
Sodium: 139 meq/L (ref 135–145)
Total Bilirubin: 1 mg/dL (ref 0.2–1.2)
Total Protein: 6.3 g/dL (ref 6.0–8.3)

## 2023-01-14 LAB — VITAMIN D 25 HYDROXY (VIT D DEFICIENCY, FRACTURES): VITD: 73.85 ng/mL (ref 30.00–100.00)

## 2023-01-14 LAB — HEMOGLOBIN A1C: Hgb A1c MFr Bld: 5.7 % (ref 4.6–6.5)

## 2023-01-14 NOTE — Patient Instructions (Signed)
Ms. Nygren , Thank you for taking time to come for your Medicare Wellness Visit. I appreciate your ongoing commitment to your health goals. Please review the following plan we discussed and let me know if I can assist you in the future.   Referrals/Orders/Follow-Ups/Clinician Recommendations: start to exercise daily   Aim for 30 minutes of exercise or brisk walking, 6-8 glasses of water, and 5 servings of fruits and vegetables each day.   This is a list of the screening recommended for you and due dates:  Health Maintenance  Topic Date Due   Flu Shot  10/18/2022   COVID-19 Vaccine (4 - 2023-24 season) 11/18/2022   Medicare Annual Wellness Visit  12/20/2022   Mammogram  12/26/2023   Colon Cancer Screening  11/09/2028   DTaP/Tdap/Td vaccine (3 - Td or Tdap) 07/28/2030   Pneumonia Vaccine  Completed   DEXA scan (bone density measurement)  Completed   Hepatitis C Screening  Completed   Zoster (Shingles) Vaccine  Completed   HPV Vaccine  Aged Out    Advanced directives: (Copy Requested) Please bring a copy of your health care power of attorney and living will to the office to be added to your chart at your convenience.  Next Medicare Annual Wellness Visit scheduled for next year: Yes

## 2023-01-14 NOTE — Progress Notes (Signed)
Subjective:   Savannah Hill is a 72 y.o. female who presents for Medicare Annual (Subsequent) preventive examination.  Visit Complete: Virtual I connected with  Hildred Laser on 01/14/23 by a audio enabled telemedicine application and verified that I am speaking with the correct person using two identifiers.  Patient Location: Home  Provider Location: Office/Clinic  I discussed the limitations of evaluation and management by telemedicine. The patient expressed understanding and agreed to proceed.  Vital Signs: Because this visit was a virtual/telehealth visit, some criteria may be missing or patient reported. Any vitals not documented were not able to be obtained and vitals that have been documented are patient reported.   Cardiac Risk Factors include: advanced age (>28men, >80 women);dyslipidemia;hypertension;obesity (BMI >30kg/m2)     Objective:    Today's Vitals   01/14/23 1432  Weight: 171 lb (77.6 kg)   Body mass index is 30.29 kg/m.     01/14/2023    2:37 PM 05/28/2021    4:34 AM 02/24/2021    9:32 AM 10/05/2016   10:28 AM 06/07/2016    9:11 AM 10/01/2011    8:16 AM  Advanced Directives  Does Patient Have a Medical Advance Directive? Yes Yes Yes No Yes Patient has advance directive, copy not in chart  Type of Advance Directive Healthcare Power of [place];Living will Living will   Healthcare Power of Attorney Living will;Healthcare Power of Attorney  Copy of Healthcare Power of Attorney in Chart? No - copy requested    No - copy requested   Would patient like information on creating a medical advance directive?    No - Patient declined    Pre-existing out of facility DNR order (yellow form or pink MOST form)      No    Current Medications (verified) Outpatient Encounter Medications as of 01/14/2023  Medication Sig   ALPRAZolam (XANAX) 0.5 MG tablet Take 1 tablet (0.5 mg total) by mouth at bedtime as needed for anxiety.   Cholecalciferol (VITAMIN D3) 125  MCG (5000 UT) CAPS Take 1 capsule by mouth daily.   citalopram (CELEXA) 20 MG tablet TAKE 1 TABLET DAILY   docusate sodium (COLACE) 100 MG capsule Take 100 mg by mouth daily.   ELIQUIS 5 MG TABS tablet TAKE 1 TABLET TWICE A DAY   ezetimibe (ZETIA) 10 MG tablet Take 1 tablet (10 mg total) by mouth daily.   furosemide (LASIX) 20 MG tablet TAKE 1 TABLET DAILY   Multiple Vitamin (MULTIVITAMIN WITH MINERALS) TABS Take 1 tablet by mouth daily.   rosuvastatin (CRESTOR) 5 MG tablet Take 1 tablet (5 mg total) by mouth daily.   Semaglutide,0.25 or 0.5MG /DOS, (OZEMPIC, 0.25 OR 0.5 MG/DOSE,) 2 MG/1.5ML SOPN Inject 0.25 mg into the skin once a week.   traZODone (DESYREL) 100 MG tablet TAKE 1 TABLET AT BEDTIME ASNEEDED FOR SLEEP   No facility-administered encounter medications on file as of 01/14/2023.    Allergies (verified) Patient has no known allergies.   History: Past Medical History:  Diagnosis Date   Anemia    borderline   Atrial fibrillation (HCC)    a. s/p ablation on 10/05/2016   [facility]cer [facility])    cervical/rad hysterectomy/bso wiht chemo for small cell ca   Dyspnea    Heart valve disorder    HLD (hyperlipidemia)    Hypertension    Joint pain    Lower extremity edema    Obesity    Palpitations    Past Surgical History:  Procedure Laterality Date  ABLATION OF DYSRHYTHMIC FOCUS  10/05/2016   ATRIAL FIBRILLATION ABLATION N/A 10/05/2016   Procedure: Atrial Fibrillation Ablation;  Surgeon: Regan Lemming, MD;  Location: [facility] INVASIVE CV LAB;  Service: Cardiovascular;  Laterality: N/A;   IR RADIOLOGY PERIPHERAL GUIDED IV START  09/28/2016   IR US GUIDE VASC ACCESS RIGHT  09/28/2016   RADICAL ABDOMINAL HYSTERECTOMY  1986   with BSO   REPLACEMENT TOTAL KNEE Right 2022   TEE WITHOUT CARDIOVERSION N/A 06/07/2016   Procedure: TRANSESOPHAGEAL ECHOCARDIOGRAM (TEE);  Surgeon: Thurmon Fair, MD;  Location: [facility] ENDOSCOPY;  Service: Cardiovascular;  Laterality: N/A;   Family History   Problem Relation Age of Onset   Diabetes Mother    Hypertension Mother    Heart disease Mother    Thyroid disease Mother    Depression Mother    Alcoholism Mother    Skin cancer Father    CVA Father    Diabetes Father    Hypertension Father    Cancer Paternal Grandfather        unknown type   Social History   Socioeconomic History   Marital status: Married    Spouse name: Braniyah Oliveria   Number of children: 1   Years of education: Not on file   Highest education level: Not on file  Occupational History   Occupation: Retired/ But Physiological scientist [employer]  Tobacco Use   Smoking status: Never   Smokeless tobacco: Never  Vaping Use   Vaping status: Never Used  Substance and Sexual Activity   Alcohol use: Yes    Alcohol/week: 10.0 standard drinks of alcohol    Types: 10 Glasses of wine per week   Drug use: No   Sexual activity: Yes    Partners: Male    Birth control/protection: Surgical  Other Topics Concern   Not on file  Social History Narrative   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   Married          Son in Mutual   Social Determinants of Health   Financial Resource Strain: Low Risk  (01/14/2023)   Overall Financial Resource Strain (CARDIA)    Difficulty of Paying Living Expenses: Not hard at all  Food Insecurity: No Food Insecurity (01/14/2023)   Hunger Vital Sign    Worried About Running Out of Food in the Last Year: Never true    Ran Out of Food in the Last Year: Never true  Transportation Needs: No Transportation Needs (01/14/2023)   PRAPARE - Administrator, Civil Service (Medical): No    Lack of Transportation (Non-Medical): No  Physical Activity: Insufficiently Active (01/14/2023)   Exercise Vital Sign    Days of Exercise per Week: 2 days    Minutes of Exercise per Session: 30 min  Stress: No Stress Concern Present (01/14/2023)   [employer] of Occupational Health -  Occupational Stress Questionnaire    Feeling of Stress : Not at all  Social Connections: Moderately Integrated (01/14/2023)   Social Connection and Isolation Panel [NHANES]    Frequency of Communication with Friends and Family: More than three times a week    Frequency of Social Gatherings with Friends and Family: More than three times a week    Attends Religious Services: More than 4 times per year    Active Member of [employer] or Organizations: No    Attends Banker Meetings: Never    Marital Status: Married  Tobacco Counseling Counseling given: Not Answered   Clinical Intake:  Pre-visit preparation completed: Yes  Pain : No/denies pain     BMI - recorded: 30.29 Nutritional Status: BMI > 30  Obese Nutritional Risks: None Diabetes: No  How often do you need to have someone help you when you read instructions, pamphlets, or other written materials from your doctor or pharmacy?: 1 - Never  Interpreter Needed?: No  Information entered by :: Lanier Ensign, LPN   Activities of Daily Living    01/14/2023    2:34 PM  In your present state of health, do you have any difficulty performing the following activities:  Hearing? 0  Vision? 0  Difficulty concentrating or making decisions? 0  Walking or climbing stairs? 0  Dressing or bathing? 0  Doing errands, shopping? 0  Preparing Food and eating ? N  Using the Toilet? N  In the past six months, have you accidently leaked urine? N  Do you have problems with loss of bowel control? N  Managing your Medications? N  Managing your Finances? N  Housekeeping or managing your Housekeeping? N    Patient Care Team: Excell Seltzer, MD as PCP - General (Family Medicine) Regan Lemming, MD as PCP - Cardiology (Cardiology) Jerene Bears, MD as Consulting Physician (Gynecology) Specialists, Dermatology as Consulting Physician (Dermatology) Marcene Corning, MD as Consulting Physician (Orthopedic Surgery) Ortho,  [place] (Specialist) Charna Elizabeth, MD as Consulting Physician (Gastroenterology)  Indicate any recent Medical Services you may have received from other than [facility] providers in the past year (date may be approximate).     Assessment:   This is a routine wellness examination for Wynema.  Hearing/Vision screen Hearing Screening - Comments:: Pt denies any hearing issues  Vision Screening - Comments:: Pt follows up with eye provider    Goals Addressed             This Visit's Progress    Patient Stated       Exercise        Depression Screen    01/14/2023    2:36 PM 12/19/2021    1:58 PM 10/05/2021    8:11 AM 02/17/2021    9:39 AM 01/30/2021   10:46 AM 08/18/2020   11:40 AM 07/27/2020    2:35 PM  PHQ 2/9 Scores  PHQ - 2 Score 0 0 0 0 0 0 0  PHQ- 9 Score  2 0  0 0 0    Fall Risk    01/14/2023    2:38 PM 05/03/2022   11:28 AM 12/19/2021    1:58 PM 10/05/2021    8:11 AM 01/30/2021   10:45 AM  Fall Risk   Falls in the past year? 0 0 0 0 0  Number falls in past yr: 0 0 0 0 0  Injury with Fall? 0 0 0 0 0  Risk for fall due to : No Fall Risks No Fall Risks No Fall Risks No Fall Risks No Fall Risks  Follow up Falls prevention discussed Falls evaluation completed Falls evaluation completed Falls evaluation completed Falls evaluation completed    MEDICARE RISK AT HOME: Medicare Risk at Home Any stairs in or around the home?: No If so, are there any without handrails?: No Home free of loose throw rugs in walkways, pet beds, electrical cords, etc?: Yes Adequate lighting in your home to reduce risk of falls?: Yes Life alert?: No Use of a cane, walker or w/c?: No Grab bars in the  bathroom?: No Shower chair or bench in shower?: No Elevated toilet seat or a handicapped toilet?: No  TIMED UP AND GO:  Was the test performed?  No    Cognitive Function:        01/14/2023    2:38 PM 12/19/2021    1:34 PM 07/27/2020    2:36 PM 06/17/2019    1:24 PM  6CIT Screen  What Year? 0  points 0 points 0 points 0 points  What month? 0 points 0 points 0 points 0 points  What time? 0 points 0 points 0 points 0 points  Count back from 20 0 points 0 points 0 points 0 points  Months in reverse 0 points 0 points 2 points 0 points  Repeat phrase 0 points 0 points 0 points 6 points  Total Score 0 points 0 points 2 points 6 points    Immunizations Immunization History  Administered Date(s) Administered   Influenza, High Dose Seasonal PF 12/12/2017, 01/14/2019, 12/15/2021   Influenza-Unspecified 01/04/2020, 01/11/2021, 11/18/2022   PFIZER(Purple Top)SARS-COV-2 Vaccination 04/11/2019, 05/02/2019, 02/19/2020   Pneumococcal Conjugate-13 12/05/2016   Pneumococcal Polysaccharide-23 10/02/2011, 12/23/2017   Tdap 01/31/2011, 07/27/2020   Zoster Recombinant(Shingrix) 07/07/2019, 02/03/2020    TDAP status: Up to date  Flu Vaccine status: Up to date  Pneumococcal vaccine status: Up to date  Covid-19 vaccine status: Declined, Education has been provided regarding the importance of this vaccine but patient still declined. Advised may receive this vaccine at local pharmacy or Health Dept.or vaccine clinic. Aware to provide a copy of the vaccination record if obtained from local pharmacy or Health Dept. Verbalized acceptance and understanding.  Qualifies for Shingles Vaccine? Yes   Zostavax completed Yes   Shingrix Completed?: Yes  Screening Tests Health Maintenance  Topic Date Due   COVID-19 Vaccine (4 - 2023-24 season) 11/18/2022   MAMMOGRAM  12/26/2023   Medicare Annual Wellness (AWV)  01/14/2024   Colonoscopy  11/09/2028   DTaP/Tdap/Td (3 - Td or Tdap) 07/28/2030   Pneumonia Vaccine 27+ Years old  Completed   INFLUENZA VACCINE  Completed   DEXA SCAN  Completed   Hepatitis C Screening  Completed   Zoster Vaccines- Shingrix  Completed   HPV VACCINES  Aged Out    Health Maintenance  Health Maintenance Due  Topic Date Due   COVID-19 Vaccine (4 - 2023-24 season)  11/18/2022    Colorectal cancer screening: Type of screening: Colonoscopy. Completed 11/10/18. Repeat every 10 years  Mammogram status: Completed 12/25/21. Repeat every year  Bone Density status: Completed 12/09/18. Results reflect: Bone density results: NORMAL. Repeat every 2 years.   Additional Screening:  Hepatitis C Screening: Completed 06/03/15  Vision Screening: Recommended annual ophthalmology exams for early detection of glaucoma and other disorders of the eye. Is the patient up to date with their annual eye exam?  No  Who is the provider or what is the name of the office in which the patient attends annual eye exams? unsure of name of provider  If pt is not established with a provider, would they like to be referred to a provider to establish care? No .   Dental Screening: Recommended annual dental exams for proper oral hygiene    Community Resource Referral / Chronic Care Management: CRR required this visit?  No   CCM required this visit?  No     Plan:     I have personally reviewed and noted the following in the patient's chart:   Medical and social history Use  of alcohol, tobacco or illicit drugs  Current medications and supplements including opioid prescriptions. Patient is not currently taking opioid prescriptions. Functional ability and status Nutritional status Physical activity Advanced directives List of other physicians Hospitalizations, surgeries, and ER visits in previous 12 months Vitals Screenings to include cognitive, depression, and falls Referrals and appointments  In addition, I have reviewed and discussed with patient certain preventive protocols, quality metrics, and best practice recommendations. A written personalized care plan for preventive services as well as general preventive health recommendations were provided to patient.     Marzella Schlein, LPN   19/14/7829   After Visit Summary: (MyChart) Due to this being a telephonic visit,  the after visit summary with patients personalized plan was offered to patient via MyChart   Nurse Notes: none

## 2023-01-15 NOTE — Progress Notes (Signed)
No critical labs need to be addressed urgently. We will discuss labs in detail at upcoming office visit.   

## 2023-01-16 ENCOUNTER — Ambulatory Visit (HOSPITAL_COMMUNITY)
Admission: RE | Admit: 2023-01-16 | Discharge: 2023-01-16 | Disposition: A | Discharge status: Home / Self Care | Payer: Medicare Other | Source: Ambulatory Visit | Attending: Orthopedic Surgery | Admitting: Orthopedic Surgery

## 2023-01-16 DIAGNOSIS — Z96651 Presence of right artificial knee joint: Secondary | ICD-10-CM | POA: Diagnosis not present

## 2023-01-16 DIAGNOSIS — M25561 Pain in right knee: Secondary | ICD-10-CM | POA: Insufficient documentation

## 2023-01-16 MED ORDER — TECHNETIUM TC 99M MEDRONATE IV KIT
22.0000 | PACK | Freq: Once | INTRAVENOUS | Status: AC | PRN
  Administered 2023-01-16: 22 via INTRAVENOUS

## 2023-01-17 ENCOUNTER — Encounter: Payer: Self-pay | Admitting: Family Medicine

## 2023-01-17 ENCOUNTER — Ambulatory Visit (INDEPENDENT_AMBULATORY_CARE_PROVIDER_SITE_OTHER): Payer: Medicare Other | Admitting: Family Medicine

## 2023-01-17 VITALS — BP 114/74 | HR 73 | Temp 98.4°F | Ht 62.0 in | Wt 155.2 lb

## 2023-01-17 DIAGNOSIS — I480 Paroxysmal atrial fibrillation: Secondary | ICD-10-CM | POA: Diagnosis not present

## 2023-01-17 DIAGNOSIS — Z1231 Encounter for screening mammogram for malignant neoplasm of breast: Secondary | ICD-10-CM | POA: Diagnosis not present

## 2023-01-17 DIAGNOSIS — E6609 Other obesity due to excess calories: Secondary | ICD-10-CM

## 2023-01-17 DIAGNOSIS — E66811 Obesity, class 1: Secondary | ICD-10-CM | POA: Diagnosis not present

## 2023-01-17 DIAGNOSIS — I890 Lymphedema, not elsewhere classified: Secondary | ICD-10-CM

## 2023-01-17 DIAGNOSIS — E785 Hyperlipidemia, unspecified: Secondary | ICD-10-CM | POA: Diagnosis not present

## 2023-01-17 DIAGNOSIS — R051 Acute cough: Secondary | ICD-10-CM | POA: Diagnosis not present

## 2023-01-17 DIAGNOSIS — Z8673 Personal history of transient ischemic attack (TIA), and cerebral infarction without residual deficits: Secondary | ICD-10-CM

## 2023-01-17 DIAGNOSIS — I10 Essential (primary) hypertension: Secondary | ICD-10-CM | POA: Diagnosis not present

## 2023-01-17 DIAGNOSIS — R7301 Impaired fasting glucose: Secondary | ICD-10-CM | POA: Diagnosis not present

## 2023-01-17 DIAGNOSIS — Z6832 Body mass index (BMI) 32.0-32.9, adult: Secondary | ICD-10-CM

## 2023-01-17 LAB — HM MAMMOGRAPHY

## 2023-01-17 MED ORDER — AZITHROMYCIN 250 MG PO TABS: ORAL_TABLET | ORAL | 0 refills | Status: AC

## 2023-01-17 NOTE — Progress Notes (Signed)
Patient ID: Savannah Hill, female    DOB: 1950/12/11, 72 y.o.   MRN: [id number]  This visit was conducted in person.  BP 114/74   Pulse 73   Temp 98.4 F (36.9 C) (Oral)   Ht 5\' 2"  (1.575 m)   Wt 155 lb 3.2 oz (70.4 kg)   LMP 03/19/1984   SpO2 94%   BMI 28.39 kg/m    CC:  Chief Complaint  Patient presents with   Annual Exam    Subjective:   HPI: Savannah Hill is a 72 y.o. female presenting on 01/17/2023 for Annual Exam  The patient presents for review of chronic health problems. He/She also has the following acute concerns today:   Recent trip to [place] 2 week.  In last week 1 week.. nasal congestion,  dry cough, no ear pain, no face pain, no ST, no fever. Feeling very tired.  No SOB, no wheeze.  The patient saw a LPN or RN for medicare wellness visit. 01/14/23  Prevention and wellness was reviewed in detail. Note reviewed and important notes copied below.  Hypertension:  Well-controlled  No longer on BP meds. BP Readings from Last 3 Encounters:  01/17/23 114/74  08/29/22 112/70  07/30/22 115/78  Using medication without problems or lightheadedness: none Chest pain with exertion: none Edema: chronic lymphedema  right leg. Short of breath: none Average home BPs: Other issues:  Followed by cardiology for left anterior fascicular block, mitral valve disease, paroxysmal atrial fibrillation  History of stroke: Followed by neurology  Prediabetes:  Lab Results  Component Value Date   HGBA1C 5.7 01/14/2023   Obesity, BMI 32.5: On semaglutide unclear amount.   Prescribed  by  NP from neighborhoods.  She has lost 20Lb in 4 months. Body mass index is 28.39 kg/m.  Wt Readings from Last 3 Encounters:  01/17/23 155 lb 3.2 oz (70.4 kg)  01/14/23 171 lb (77.6 kg)  08/29/22 171 lb (77.6 kg)   Elevated Cholesterol: On Zetia Lab Results  Component Value Date   CHOL 180 01/14/2023   HDL 72.10 01/14/2023   LDLCALC 92 01/14/2023   TRIG 82.0 01/14/2023    CHOLHDL 2 01/14/2023  Using medications without problems: Muscle aches:  Diet compliance: improving Exercise: working on increasing Other complaints:      Relevant past medical, surgical, family and social history reviewed and updated as indicated. Interim medical history since our last visit reviewed. Allergies and medications reviewed and updated. Outpatient Medications Prior to Visit  Medication Sig Dispense Refill   ALPRAZolam (XANAX) 0.5 MG tablet Take 1 tablet (0.5 mg total) by mouth at bedtime as needed for anxiety. 90 tablet 0   Cholecalciferol (VITAMIN D3) 125 MCG (5000 UT) CAPS Take 1 capsule by mouth daily.     citalopram (CELEXA) 20 MG tablet TAKE 1 TABLET DAILY 90 tablet 3   docusate sodium (COLACE) 100 MG capsule Take 100 mg by mouth daily.     ELIQUIS 5 MG TABS tablet TAKE 1 TABLET TWICE A DAY 180 tablet 1   ezetimibe (ZETIA) 10 MG tablet Take 1 tablet (10 mg total) by mouth daily. 90 tablet 0   furosemide (LASIX) 20 MG tablet TAKE 1 TABLET DAILY 90 tablet 0   Multiple Vitamin (MULTIVITAMIN WITH MINERALS) TABS Take 1 tablet by mouth daily.     rosuvastatin (CRESTOR) 5 MG tablet Take 1 tablet (5 mg total) by mouth daily. 90 tablet 3   Semaglutide,0.25 or 0.5MG /DOS, (OZEMPIC, 0.25 OR 0.5 MG/DOSE,)  2 MG/1.5ML SOPN Inject 0.25 mg into the skin once a week.     traZODone (DESYREL) 100 MG tablet TAKE 1 TABLET AT BEDTIME ASNEEDED FOR SLEEP 90 tablet 0   No facility-administered medications prior to visit.     Per HPI unless specifically indicated in ROS section below Review of Systems  Constitutional:  Negative for fatigue and fever.  HENT:  Negative for congestion.   Eyes:  Negative for pain.  Respiratory:  Negative for cough and shortness of breath.   Cardiovascular:  Negative for chest pain, palpitations and leg swelling.  Gastrointestinal:  Negative for abdominal pain.  Genitourinary:  Negative for dysuria and vaginal bleeding.  Musculoskeletal:  Negative for back  pain.  Neurological:  Negative for syncope, light-headedness and headaches.  Psychiatric/Behavioral:  Negative for dysphoric mood.    Objective:  BP 114/74   Pulse 73   Temp 98.4 F (36.9 C) (Oral)   Ht 5\' 2"  (1.575 m)   Wt 155 lb 3.2 oz (70.4 kg)   LMP 03/19/1984   SpO2 94%   BMI 28.39 kg/m   Wt Readings from Last 3 Encounters:  01/17/23 155 lb 3.2 oz (70.4 kg)  01/14/23 171 lb (77.6 kg)  08/29/22 171 lb (77.6 kg)      Physical Exam Constitutional:      General: She is not in acute distress.    Appearance: Normal appearance. She is well-developed. She is not ill-appearing or toxic-appearing.  HENT:     Head: Normocephalic.     Right Ear: Hearing, tympanic membrane, ear canal and external ear normal. Tympanic membrane is not erythematous, retracted or bulging.     Left Ear: Hearing, tympanic membrane, ear canal and external ear normal. Tympanic membrane is not erythematous, retracted or bulging.     Nose: No mucosal edema or rhinorrhea.     Right Sinus: No maxillary sinus tenderness or frontal sinus tenderness.     Left Sinus: No maxillary sinus tenderness or frontal sinus tenderness.     Mouth/Throat:     Pharynx: Uvula midline.  Eyes:     General: Lids are normal. Lids are everted, no foreign bodies appreciated.     Conjunctiva/sclera: Conjunctivae normal.     Pupils: Pupils are equal, round, and reactive to light.  Neck:     Thyroid: No thyroid mass or thyromegaly.     Vascular: No carotid bruit.     Trachea: Trachea normal.  Cardiovascular:     Rate and Rhythm: Normal rate and regular rhythm.     Pulses: Normal pulses.     Heart sounds: Normal heart sounds, S1 normal and S2 normal. No murmur heard.    No friction rub. No gallop.  Pulmonary:     Effort: Pulmonary effort is normal. No tachypnea or respiratory distress.     Breath sounds: Normal breath sounds. No decreased breath sounds, wheezing, rhonchi or rales.  Abdominal:     General: Bowel sounds are normal.      Palpations: Abdomen is soft.     Tenderness: There is no abdominal tenderness.  Musculoskeletal:     Cervical back: Normal range of motion and neck supple.  Skin:    General: Skin is warm and dry.     Findings: No rash.  Neurological:     Mental Status: She is alert.  Psychiatric:        Mood and Affect: Mood is not anxious or depressed.        Speech: Speech normal.  Behavior: Behavior normal. Behavior is cooperative.        Thought Content: Thought content normal.        Judgment: Judgment normal.       Results for orders placed or performed in visit on 01/14/23  Lipid panel  Result Value Ref Range   Cholesterol 180 0 - 200 mg/dL   Triglycerides 41.3 0.0 - 149.0 mg/dL   HDL 24.40 >10.27 mg/dL   VLDL 25.3 0.0 - 66.4 mg/dL   LDL Cholesterol 92 0 - 99 mg/dL   Total CHOL/HDL Ratio 2    NonHDL 108.05   Hemoglobin A1c  Result Value Ref Range   Hgb A1c MFr Bld 5.7 4.6 - 6.5 %  Comprehensive metabolic panel  Result Value Ref Range   Sodium 139 135 - 145 mEq/L   Potassium 4.2 3.5 - 5.1 mEq/L   Chloride 101 96 - 112 mEq/L   CO2 29 19 - 32 mEq/L   Glucose, Bld 94 70 - 99 mg/dL   BUN 9 6 - 23 mg/dL   Creatinine, Ser 4.03 0.40 - 1.20 mg/dL   Total Bilirubin 1.0 0.2 - 1.2 mg/dL   Alkaline Phosphatase 65 39 - 117 U/L   AST 31 0 - 37 U/L   ALT 15 0 - 35 U/L   Total Protein 6.3 6.0 - 8.3 g/dL   Albumin 4.0 3.5 - 5.2 g/dL   GFR 47.42 >59.56 mL/min   Calcium 9.4 8.4 - 10.5 mg/dL  VITAMIN D 25 Hydroxy (Vit-D Deficiency, Fractures)  Result Value Ref Range   VITD 73.85 30.00 - 100.00 ng/mL    Assessment and Plan The patient's preventative maintenance and recommended screening tests for an annual wellness exam were reviewed in full today. Brought up to date unless services declined.  Counselled on the importance of diet, exercise, and its role in overall health and mortality. The patient's FH and SH was reviewed, including their home life, tobacco status, and drug and  alcohol status.    Vaccines: Up-to-date with Shingrix vaccine series, pneumonia series, flu given November 18, 2022, tetanus up-to-date. Pap/DVE:  HX of small cell ca of cervix... S/P total hysterectomy Followed  GYN Mammo:  01/17/2023 Bone Density: 12/09/2018, nml Colon: 11/10/2018 repeat in 10 years Smoking Status:none ETOH/ drug use: none/none  Hep C:  done    PAF (paroxysmal atrial fibrillation) (HCC) Assessment & Plan:  S/P ablation. -Followed by cardiology.. Dr. Elberta Fortis...  reviewed last OV 08/29/2022 -On Cardizem 120 mg BID and Eliquis 5 mg BID.   Essential hypertension Assessment & Plan: Stable, chronic.   Now off BP meds with weihgt loss.      Hyperlipidemia, unspecified hyperlipidemia type Assessment & Plan: Chronic.  Labs reviewed in detail.  LDL not at goal less than 70 given recent CVA.   CVA was likely due to acute onset A-fib not on anticoagulation at that time.  No history of heart disease.  She is only currently able to tolerate Crestor 5 mg every other day without side effects.  She has not tried coenzyme every 10.  Recommendations would still be to keep LDL less than 70 but she does not want additional medication. She was on Zetia... We discussed possibly adding this back but she is not interested at this time.  Of note her HDL LDL ratio is excellent. She will continue work on low-cholesterol diet, continue her excellent weight loss and start regular exercise.   History of CVA (cerebrovascular accident) Assessment & Plan: -Followed by neurology.[name] KitchenMarland Kitchen  Butch Penny NP  5/13 -Reviewed hospital notes, labs and imaging. -Will continue to work on secondary stroke prevention.   Class 1 obesity due to excess calories with serious comorbidity and body mass index (BMI) of 32.0 to 32.9 in adult Assessment & Plan:  Associated with hypertension and impaired glucose, past stroke and paroxysmal atrial fibrillation. She is currently on semaglutide 0.25 mg  weekly  Encouraged exercise, weight loss, healthy eating habits.    Impaired fasting glucose Assessment & Plan: Lab Results  Component Value Date   HGBA1C 5.7 01/14/2023      Lymphedema Assessment & Plan:  Right lower leg.     Acute cough Assessment & Plan: Acute, most likely initial viral infection now with bacterial superinfection.  Symptoms going on greater than 7 to 10 days.  Will treat with azithromycin taper.,  Push fluids, rest.  Continue nasal saline irrigation.  Return and ER precautions provided   Other orders -     Azithromycin; Take 2 tablets on day 1, then 1 tablet daily on days 2 through 5  Dispense: 6 tablet; Refill: 0     Return in about 1 year (around 01/17/2024) for phone AMW,  fasting labs then CPE with me.   Kerby Nora, MD

## 2023-01-17 NOTE — Assessment & Plan Note (Addendum)
Right lower leg

## 2023-01-17 NOTE — Assessment & Plan Note (Signed)
-  Followed by neurology... Butch Penny NP  5/13 -Reviewed hospital notes, labs and imaging. -Will continue to work on secondary stroke prevention.

## 2023-01-17 NOTE — Assessment & Plan Note (Signed)
Lab Results  Component Value Date   HGBA1C 5.7 01/14/2023

## 2023-01-17 NOTE — Assessment & Plan Note (Addendum)
Stable, chronic.   Now off BP meds with weihgt loss.

## 2023-01-17 NOTE — Assessment & Plan Note (Signed)
Acute, most likely initial viral infection now with bacterial superinfection.  Symptoms going on greater than 7 to 10 days.  Will treat with azithromycin taper.,  Push fluids, rest.  Continue nasal saline irrigation.  Return and ER precautions provided

## 2023-01-17 NOTE — Assessment & Plan Note (Signed)
S/P ablation. -Followed by cardiology.. Dr. Elberta Fortis...  reviewed last OV 08/29/2022 -On Cardizem 120 mg BID and Eliquis 5 mg BID.

## 2023-01-17 NOTE — Assessment & Plan Note (Signed)
Associated with hypertension and impaired glucose, past stroke and paroxysmal atrial fibrillation. She is currently on semaglutide 0.25 mg weekly  Encouraged exercise, weight loss, healthy eating habits.

## 2023-01-17 NOTE — Assessment & Plan Note (Addendum)
Chronic.  Labs reviewed in detail.  LDL not at goal less than 70 given recent CVA.   CVA was likely due to acute onset A-fib not on anticoagulation at that time.  No history of heart disease.  She is only currently able to tolerate Crestor 5 mg every other day without side effects.  She has not tried coenzyme every 10.  Recommendations would still be to keep LDL less than 70 but she does not want additional medication. She was on Zetia... We discussed possibly adding this back but she is not interested at this time.  Of note her HDL LDL ratio is excellent. She will continue work on low-cholesterol diet, continue her excellent weight loss and start regular exercise.

## 2023-01-22 ENCOUNTER — Encounter (HOSPITAL_BASED_OUTPATIENT_CLINIC_OR_DEPARTMENT_OTHER): Payer: Self-pay | Admitting: *Deleted

## 2023-01-24 DIAGNOSIS — Z85828 Personal history of other malignant neoplasm of skin: Secondary | ICD-10-CM | POA: Diagnosis not present

## 2023-01-24 DIAGNOSIS — L814 Other melanin hyperpigmentation: Secondary | ICD-10-CM | POA: Diagnosis not present

## 2023-01-24 DIAGNOSIS — D225 Melanocytic nevi of trunk: Secondary | ICD-10-CM | POA: Diagnosis not present

## 2023-01-24 DIAGNOSIS — L821 Other seborrheic keratosis: Secondary | ICD-10-CM | POA: Diagnosis not present

## 2023-01-24 DIAGNOSIS — L570 Actinic keratosis: Secondary | ICD-10-CM | POA: Diagnosis not present

## 2023-01-24 DIAGNOSIS — L578 Other skin changes due to chronic exposure to nonionizing radiation: Secondary | ICD-10-CM | POA: Diagnosis not present

## 2023-02-07 DIAGNOSIS — M25561 Pain in right knee: Secondary | ICD-10-CM | POA: Diagnosis not present

## 2023-02-20 DIAGNOSIS — M7051 Other bursitis of knee, right knee: Secondary | ICD-10-CM | POA: Diagnosis not present

## 2023-02-25 DIAGNOSIS — M7051 Other bursitis of knee, right knee: Secondary | ICD-10-CM | POA: Diagnosis not present

## 2023-02-27 DIAGNOSIS — M7051 Other bursitis of knee, right knee: Secondary | ICD-10-CM | POA: Diagnosis not present

## 2023-03-04 ENCOUNTER — Other Ambulatory Visit: Payer: Self-pay | Admitting: Family Medicine

## 2023-03-04 DIAGNOSIS — M7051 Other bursitis of knee, right knee: Secondary | ICD-10-CM | POA: Diagnosis not present

## 2023-03-04 DIAGNOSIS — I890 Lymphedema, not elsewhere classified: Secondary | ICD-10-CM

## 2023-03-06 DIAGNOSIS — M7051 Other bursitis of knee, right knee: Secondary | ICD-10-CM | POA: Diagnosis not present

## 2023-03-11 DIAGNOSIS — M7051 Other bursitis of knee, right knee: Secondary | ICD-10-CM | POA: Diagnosis not present

## 2023-03-26 DIAGNOSIS — M7051 Other bursitis of knee, right knee: Secondary | ICD-10-CM | POA: Diagnosis not present

## 2023-03-27 ENCOUNTER — Other Ambulatory Visit: Payer: Self-pay | Admitting: Family Medicine

## 2023-03-27 DIAGNOSIS — I480 Paroxysmal atrial fibrillation: Secondary | ICD-10-CM

## 2023-03-27 DIAGNOSIS — I10 Essential (primary) hypertension: Secondary | ICD-10-CM

## 2023-03-27 DIAGNOSIS — F5101 Primary insomnia: Secondary | ICD-10-CM

## 2023-03-29 ENCOUNTER — Telehealth: Payer: Self-pay | Admitting: Cardiology

## 2023-03-29 NOTE — Telephone Encounter (Signed)
 Call to patient to advise that this medication may have been denied because it was discontinued on 10/17/22. Patient acknowledges this is true, she states she was having dizziness around that time, she had lost weight and her BP was running lower than it had been when she was originally put on diltiazem . She states she was never on this medication for her afib, she was on it for her hypertension.  She states that in December she noticed she was getting headaches and checked her BP, it was 165/104. She started taking diltizem HCL ER 120 mg that she left over from her previous script and it alleviated her headaches. Today's BP is 125/85. She states she does not check her HR at home, but denies and chest pain, SOB or palpitations. She would like to restart this medication and have it added back to her medication list so she is able to have refills. Forwarded to Dr. Inocencio.

## 2023-03-29 NOTE — Telephone Encounter (Signed)
 Pt c/o medication issue:  1. Name of Medication: dilTIAZem  HCl ER Beads 120 MG    2. How are you currently taking this medication (dosage and times per day)? N/A  3. Are you having a reaction (difficulty breathing--STAT)? No  4. What is your medication issue? Patient wants to know why she was denied getting medication

## 2023-04-01 DIAGNOSIS — M7051 Other bursitis of knee, right knee: Secondary | ICD-10-CM | POA: Diagnosis not present

## 2023-04-01 MED ORDER — DILTIAZEM HCL ER COATED BEADS 120 MG PO CP24: 120.0000 mg | ORAL_CAPSULE | Freq: Every day | ORAL | 2 refills | Status: DC

## 2023-04-01 NOTE — Telephone Encounter (Signed)
 Aware MD ok with restarting. Rx sent to [facility] per request. Patient verbalized understanding and agreeable to plan.

## 2023-04-01 NOTE — Addendum Note (Signed)
 Addended by: Baird Lyons on: 04/01/2023 11:14 AM   Modules accepted: Orders

## 2023-04-03 DIAGNOSIS — M7051 Other bursitis of knee, right knee: Secondary | ICD-10-CM | POA: Diagnosis not present

## 2023-04-08 DIAGNOSIS — M7051 Other bursitis of knee, right knee: Secondary | ICD-10-CM | POA: Diagnosis not present

## 2023-04-15 DIAGNOSIS — M7051 Other bursitis of knee, right knee: Secondary | ICD-10-CM | POA: Diagnosis not present

## 2023-04-17 DIAGNOSIS — M7051 Other bursitis of knee, right knee: Secondary | ICD-10-CM | POA: Diagnosis not present

## 2023-04-22 DIAGNOSIS — M7051 Other bursitis of knee, right knee: Secondary | ICD-10-CM | POA: Diagnosis not present

## 2023-04-24 DIAGNOSIS — M7051 Other bursitis of knee, right knee: Secondary | ICD-10-CM | POA: Diagnosis not present

## 2023-05-06 ENCOUNTER — Encounter: Payer: Self-pay | Admitting: Emergency Medicine

## 2023-05-06 ENCOUNTER — Ambulatory Visit
Admission: EM | Admit: 2023-05-06 | Discharge: 2023-05-06 | Disposition: A | Discharge status: Home / Self Care | Payer: Medicare Other | Attending: Family Medicine | Admitting: Family Medicine

## 2023-05-06 DIAGNOSIS — J22 Unspecified acute lower respiratory infection: Secondary | ICD-10-CM | POA: Diagnosis not present

## 2023-05-06 MED ORDER — PREDNISONE 20 MG PO TABS: 20.0000 mg | ORAL_TABLET | Freq: Every day | ORAL | 0 refills | Status: AC

## 2023-05-06 MED ORDER — AMOXICILLIN-POT CLAVULANATE 875-125 MG PO TABS: 1.0000 | ORAL_TABLET | Freq: Two times a day (BID) | ORAL | 0 refills | Status: DC

## 2023-05-06 MED ORDER — PROMETHAZINE-DM 6.25-15 MG/5ML PO SYRP: 5.0000 mL | ORAL_SOLUTION | Freq: Three times a day (TID) | ORAL | 0 refills | Status: DC | PRN

## 2023-05-06 NOTE — ED Provider Notes (Signed)
 [facility]    CSN: [id number] Arrival date & time: 05/06/23  0807      History   Chief Complaint Chief Complaint  Patient presents with   Fever   Nasal Congestion   Cough    HPI Savannah Hill is a 73 y.o. female.   Patient recent history of influenza 3 to 4 weeks ago presents today with a 6-day history of worsening cough, recurrent fever, congestion, fatigue. Fever T-MAX 99.7.  Fever has been present consistently for 6 days.  Feels that her symptoms of the flu did resolve.  She is concerned that she may have COVID however symptoms been greater than 6 days therefore antiviral would not be indicated.  Patient endorses notable wheezing especially when laying down.  She denies any chest pain notable difficulty breathing.  Past Medical History:  Diagnosis Date   Anemia    borderline   Atrial fibrillation (HCC)    a. s/p ablation on 10/05/2016   Cancer [facility])    cervical/rad hysterectomy/bso wiht chemo for small cell ca   Dyspnea    Heart valve disorder    HLD (hyperlipidemia)    Hypertension    Joint pain    Lower extremity edema    Obesity    Palpitations     Patient Active Problem List   Diagnosis Date Noted   Acute cough 05/03/2022   History of CVA (cerebrovascular accident) 05/28/2021   Hyperlipidemia 06/17/2019   Class 1 obesity due to excess calories with serious comorbidity and body mass index (BMI) of 32.0 to 32.9 in adult 12/05/2018   Postmenopausal 12/05/2018   Family history of diabetes mellitus in father 12/05/2018   Impaired fasting glucose 12/05/2018   Small cell carcinoma (HCC)- of the cervix 12/05/2018   Hip pain, bilateral 05/15/2018   Essential hypertension 01/29/2018   Chronic insomnia 01/29/2018   MDD (major depressive disorder), single episode 01/29/2018   Excessive drinking of alcohol 01/29/2018   Chronic anticoagulation 10/06/2016   Mitral valve disease    PAF (paroxysmal atrial fibrillation) (HCC) 07/01/2013   LAFB (left  anterior fascicular block) 07/01/2013   Lymphedema 07/01/2013   h/o Cervical cancer 10/01/2011   H/O total hysterectomy 10/01/2011    Past Surgical History:  Procedure Laterality Date   ABLATION OF DYSRHYTHMIC FOCUS  10/05/2016   ATRIAL FIBRILLATION ABLATION N/A 10/05/2016   Procedure: Atrial Fibrillation Ablation;  Surgeon: Regan Lemming, MD;  Location: [facility] INVASIVE CV LAB;  Service: Cardiovascular;  Laterality: N/A;   IR RADIOLOGY PERIPHERAL GUIDED IV START  09/28/2016   IR US GUIDE VASC ACCESS RIGHT  09/28/2016   RADICAL ABDOMINAL HYSTERECTOMY  1986   with BSO   REPLACEMENT TOTAL KNEE Right 2022   TEE WITHOUT CARDIOVERSION N/A 06/07/2016   Procedure: TRANSESOPHAGEAL ECHOCARDIOGRAM (TEE);  Surgeon: Thurmon Fair, MD;  Location: [facility] ENDOSCOPY;  Service: Cardiovascular;  Laterality: N/A;    OB History     Gravida  1   Para  1   Term      Preterm      AB      Living  1      SAB      IAB      Ectopic      Multiple      Live Births               Home Medications    Prior to Admission medications   Medication Sig Start Date End Date Taking? Authorizing Provider  amoxicillin-clavulanate (AUGMENTIN) 875-125 MG tablet Take 1 tablet by mouth every 12 (twelve) hours. 05/06/23  Yes Bing Neighbors, NP  Cholecalciferol (VITAMIN D3) 125 MCG (5000 UT) CAPS Take 1 capsule by mouth daily.   Yes [provider]  citalopram (CELEXA) 20 MG tablet TAKE 1 TABLET DAILY 04/24/22  Yes Jerene Bears, MD  diltiazem (CARDIZEM CD) 120 MG 24 hr capsule Take 1 capsule (120 mg total) by mouth daily. 04/01/23 06/30/23 Yes Camnitz, Will Daphine Deutscher, MD  ELIQUIS 5 MG TABS tablet TAKE 1 TABLET TWICE A DAY 12/21/22  Yes Camnitz, Will Daphine Deutscher, MD  furosemide (LASIX) 20 MG tablet TAKE 1 TABLET DAILY 03/04/23  Yes Bedsole, Amy E, MD  predniSONE (DELTASONE) 20 MG tablet Take 1 tablet (20 mg total) by mouth daily with breakfast for 5 days. 05/06/23 05/11/23 Yes Bing Neighbors, NP   promethazine-dextromethorphan (PROMETHAZINE-DM) 6.25-15 MG/5ML syrup Take 5 mLs by mouth 3 (three) times daily as needed for cough (congestion). 05/06/23  Yes Bing Neighbors, NP  rosuvastatin (CRESTOR) 5 MG tablet Take 1 tablet (5 mg total) by mouth daily. 08/30/22  Yes Bedsole, Amy E, MD  Semaglutide,0.25 or 0.5MG /DOS, (OZEMPIC, 0.25 OR 0.5 MG/DOSE,) 2 MG/1.5ML SOPN Inject 0.25 mg into the skin once a week.   Yes [provider]  traZODone (DESYREL) 100 MG tablet TAKE 1 TABLET AT BEDTIME ASNEEDED FOR SLEEP 03/27/23  Yes Bedsole, Amy E, MD  ALPRAZolam (XANAX) 0.5 MG tablet Take 1 tablet (0.5 mg total) by mouth at bedtime as needed for anxiety. 08/18/20   Carlean Jews, NP  docusate sodium (COLACE) 100 MG capsule Take 100 mg by mouth daily. Patient not taking: Reported on 05/06/2023    [provider]  ezetimibe (ZETIA) 10 MG tablet Take 1 tablet (10 mg total) by mouth daily. Patient not taking: Reported on 05/06/2023 04/20/22   Excell Seltzer, MD  Multiple Vitamin (MULTIVITAMIN WITH MINERALS) TABS Take 1 tablet by mouth daily.    [provider]    Family History Family History  Problem Relation Age of Onset   Diabetes Mother    Hypertension Mother    Heart disease Mother    Thyroid disease Mother    Depression Mother    Alcoholism Mother    Skin cancer Father    CVA Father    Diabetes Father    Hypertension Father    Cancer Paternal Grandfather        unknown type    Social History Social History   Tobacco Use   Smoking status: Never   Smokeless tobacco: Never  Vaping Use   Vaping status: Never Used  Substance Use Topics   Alcohol use: Yes    Alcohol/week: 10.0 standard drinks of alcohol    Types: 10 Glasses of wine per week   Drug use: No     Allergies   Patient has no known allergies.   Review of Systems Review of Systems  Constitutional:  Positive for fever.  Respiratory:  Positive for cough.      Physical Exam Triage Vital  Signs ED Triage Vitals  Encounter Vitals Group     BP 05/06/23 0859 (!) 157/89     Systolic BP Percentile --      Diastolic BP Percentile --      Pulse Rate 05/06/23 0859 84     Resp 05/06/23 0859 18     Temp 05/06/23 0859 99.2 F (37.3 C)     Temp Source 05/06/23 0859  Oral     SpO2 05/06/23 0859 95 %     Weight --      Height --      Head Circumference --      Peak Flow --      Pain Score 05/06/23 0900 0     Pain Loc --      Pain Education --      Exclude from Growth Chart --    No data found.  Updated Vital Signs BP (!) 157/89 (BP Location: Left Arm) Comment: did not take BP meds this morning  Pulse 84   Temp 99.2 F (37.3 C) (Oral)   Resp 18   LMP 03/19/1984   SpO2 95%   Visual Acuity Right Eye Distance:   Left Eye Distance:   Bilateral Distance:    Right Eye Near:   Left Eye Near:    Bilateral Near:     Physical Exam Vitals reviewed.  Constitutional:      Appearance: She is ill-appearing.  HENT:     Head: Normocephalic and atraumatic.     Right Ear: External ear normal.     Left Ear: External ear normal.     Nose: Congestion and rhinorrhea present.  Eyes:     Extraocular Movements: Extraocular movements intact.     Pupils: Pupils are equal, round, and reactive to light.  Cardiovascular:     Rate and Rhythm: Normal rate and regular rhythm.  Pulmonary:     Breath sounds: Wheezing and rhonchi present.  Musculoskeletal:        General: Normal range of motion.     Cervical back: Normal range of motion and neck supple. No rigidity.  Skin:    General: Skin is warm.     Capillary Refill: Capillary refill takes less than 2 seconds.  Neurological:     General: No focal deficit present.     Mental Status: She is alert and oriented to person, place, and time.      UC Treatments / Results  Labs (all labs ordered are listed, but only abnormal results are displayed) Labs Reviewed - No data to display  EKG   Radiology No results  found.  Procedures Procedures (including critical care time)  Medications Ordered in UC Medications - No data to display  Initial Impression / Assessment and Plan / UC Course  I have reviewed the triage vital signs and the nursing notes.  Pertinent labs & imaging results that were available during my care of the patient were reviewed by me and considered in my medical decision making (see chart for details).    Acute Lower Respiratory Infection , treating with Augmentin for broad-spectrum coverage for bacterial lower respiratory infections.  Imaging available onsite today therefore covering with Augmentin as treatment is sensitive to most community-acquired pneumonias.  No exam findings clinically significant for possible pneumonia.  Treating lung inflammation with prednisone 20 mg once daily for 5 days.  Promethazine DM for cough.  Strict return precautions given if symptoms worsen or do not improve. Patent verbalized understanding and agreement with plan. Final Clinical Impressions(s) / UC Diagnoses   Final diagnoses:  Acute lower respiratory infection     Discharge Instructions      Medication as prescribed.  Given your symptoms suspect you have a lower respiratory infection likely secondary to recent viral illness.  Return for evaluation if your symptoms do not improve.  Emergency Department for evaluation if your symptoms become severe.     ED Prescriptions  Medication Sig Dispense Auth. Provider   amoxicillin-clavulanate (AUGMENTIN) 875-125 MG tablet Take 1 tablet by mouth every 12 (twelve) hours. 14 tablet Bing Neighbors, NP   promethazine-dextromethorphan (PROMETHAZINE-DM) 6.25-15 MG/5ML syrup Take 5 mLs by mouth 3 (three) times daily as needed for cough (congestion). 180 mL Bing Neighbors, NP   predniSONE (DELTASONE) 20 MG tablet Take 1 tablet (20 mg total) by mouth daily with breakfast for 5 days. 5 tablet Bing Neighbors, NP      PDMP not reviewed this  encounter.   Bing Neighbors, NP 05/06/23 1011

## 2023-05-06 NOTE — Discharge Instructions (Signed)
 Medication as prescribed.  Given your symptoms suspect you have a lower respiratory infection likely secondary to recent viral illness.  Return for evaluation if your symptoms do not improve.  Emergency Department for evaluation if your symptoms become severe.

## 2023-05-06 NOTE — ED Triage Notes (Signed)
 Pt reports fever, cough, and nasal & chest congestion x6 days. No relief with mucinex and tylenol at home. Pt reports having flu 3-4 weeks ago. Max temp: 99.7. Last fever: today.

## 2023-05-13 ENCOUNTER — Encounter: Payer: Self-pay | Admitting: Family Medicine

## 2023-05-14 ENCOUNTER — Ambulatory Visit
Admission: RE | Admit: 2023-05-14 | Discharge: 2023-05-14 | Disposition: A | Discharge status: Home / Self Care | Payer: Medicare Other | Source: Ambulatory Visit | Attending: Internal Medicine | Admitting: Internal Medicine

## 2023-05-14 ENCOUNTER — Ambulatory Visit (HOSPITAL_BASED_OUTPATIENT_CLINIC_OR_DEPARTMENT_OTHER)
Admission: RE | Admit: 2023-05-14 | Discharge: 2023-05-14 | Disposition: A | Discharge status: Home / Self Care | Payer: Medicare Other | Source: Ambulatory Visit | Attending: Internal Medicine | Admitting: Internal Medicine

## 2023-05-14 ENCOUNTER — Ambulatory Visit: Payer: Self-pay | Admitting: Family Medicine

## 2023-05-14 VITALS — BP 108/72 | HR 69 | Temp 98.3°F | Resp 17 | Ht 63.0 in | Wt 150.0 lb

## 2023-05-14 DIAGNOSIS — R059 Cough, unspecified: Secondary | ICD-10-CM | POA: Insufficient documentation

## 2023-05-14 DIAGNOSIS — J208 Acute bronchitis due to other specified organisms: Secondary | ICD-10-CM

## 2023-05-14 DIAGNOSIS — R918 Other nonspecific abnormal finding of lung field: Secondary | ICD-10-CM | POA: Diagnosis not present

## 2023-05-14 DIAGNOSIS — J22 Unspecified acute lower respiratory infection: Secondary | ICD-10-CM

## 2023-05-14 DIAGNOSIS — R0989 Other specified symptoms and signs involving the circulatory and respiratory systems: Secondary | ICD-10-CM | POA: Insufficient documentation

## 2023-05-14 MED ORDER — ALBUTEROL SULFATE HFA 108 (90 BASE) MCG/ACT IN AERS: 1.0000 | INHALATION_SPRAY | Freq: Four times a day (QID) | RESPIRATORY_TRACT | 0 refills | Status: DC | PRN

## 2023-05-14 MED ORDER — PREDNISONE 20 MG PO TABS: 40.0000 mg | ORAL_TABLET | Freq: Every day | ORAL | 0 refills | Status: AC

## 2023-05-14 MED ORDER — AZITHROMYCIN 250 MG PO TABS: 250.0000 mg | ORAL_TABLET | Freq: Every day | ORAL | 0 refills | Status: DC

## 2023-05-14 MED ORDER — IPRATROPIUM-ALBUTEROL 0.5-2.5 (3) MG/3ML IN SOLN
3.0000 mL | Freq: Once | RESPIRATORY_TRACT | Status: AC
  Administered 2023-05-14: 3 mL via RESPIRATORY_TRACT

## 2023-05-14 MED ORDER — METHYLPREDNISOLONE ACETATE 40 MG/ML IJ SUSP
40.0000 mg | Freq: Once | INTRAMUSCULAR | Status: AC
  Administered 2023-05-14: 40 mg via INTRAMUSCULAR

## 2023-05-14 NOTE — ED Triage Notes (Signed)
 Pt states that she has a cough, chest congestion and wheezing. X1 week  Pt states that she was recently seen at an urgent care and treated for a lower respiratory infection.  Pt states that she doesn't feel any better.

## 2023-05-14 NOTE — Telephone Encounter (Signed)
 Copied from CRM [id number]. Topic: Appointments - Appointment Scheduling >> May 14, 2023 10:25 AM Alcus Dad wrote: Patient/patient representative is calling to schedule an appointment.Patient has a lot of congestion. Dr. Prentiss Bells patient on Prednisone and cough medicine but is not getting any better. Wants Dr. To listen to lungs and maybe chest xray  Chief Complaint: cough, congestion, on medication and not feeling better. Amoxicillin, prednisone was given Symptoms: coughing, not feeling better Frequency: constant Pertinent Negatives: Patient denies fever, cp, sob Disposition: [] ED /[x] Urgent Care (no appt availability in office) / [] Appointment(In office/virtual)/ []  [facility]/ [] Home Care/ [] Refused Recommended Disposition /[] [facility]/ []  Follow-up with PCP Additional Notes: Per protocol, instructed to make apt., no apt available today; instructed to go to UC.  Care advice given, denies questions, instructed to go to the ER if becomes worse.   Reason for Disposition  [1] MILD difficulty breathing (e.g., minimal/no SOB at rest, SOB with walking, pulse <100) AND [2] still present when not coughing  Answer Assessment - Initial Assessment Questions 1. ONSET: "When did the cough begin?"      About a week ago 2. SEVERITY: "How bad is the cough today?"      Dry non productive cough 3. SPUTUM: "Describe the color of your sputum" (none, dry cough; clear, white, yellow, green)     Denies  4. HEMOPTYSIS: "Are you coughing up any blood?" If so ask: "How much?" (flecks, streaks, tablespoons, etc.)     denies 5. DIFFICULTY BREATHING: "Are you having difficulty breathing?" If Yes, ask: "How bad is it?" (e.g., mild, moderate, severe)    - MILD: No SOB at rest, mild SOB with walking, speaks normally in sentences, can lie down, no retractions, pulse < 100.    - MODERATE: SOB at rest, SOB with minimal exertion and prefers to sit, cannot lie down flat, speaks in phrases, mild retractions,  audible wheezing, pulse 100-120.    - SEVERE: Very SOB at rest, speaks in single words, struggling to breathe, sitting hunched forward, retractions, pulse > 120      wheezing 6. FEVER: "Do you have a fever?" If Yes, ask: "What is your temperature, how was it measured, and when did it start?"     denies 7. CARDIAC HISTORY: "Do you have any history of heart disease?" (e.g., heart attack, congestive heart failure)      stroke 8. LUNG HISTORY: "Do you have any history of lung disease?"  (e.g., pulmonary embolus, asthma, emphysema)     denies 9. PE RISK FACTORS: "Do you have a history of blood clots?" (or: recent major surgery, recent prolonged travel, bedridden)     Yes during covid and was off blood thinners.  10. OTHER SYMPTOMS: "Do you have any other symptoms?" (e.g., runny nose, wheezing, chest pain)       Runny nose,  11. PREGNANCY: "Is there any chance you are pregnant?" "When was your last menstrual period?"       na 12. TRAVEL: "Have you traveled out of the country in the last month?" (e.g., travel history, exposures)       Denies.  Protocols used: Cough - Acute Productive-A-AH

## 2023-05-14 NOTE — Discharge Instructions (Addendum)
 Symptoms and physical exam findings are most consistent with a persistent pneumonia versus bronchitis.  We will treat with the following: DuoNeb treatment done today in clinic with improvement in lung sounds but they are not normal yet Medrol injection given today in clinic.  This is a steroid to help with inflammation and improve your airways Azithromycin 250mg  Take 2 tablets today and the 1 tablet daily for 4 more days. This is an antibiotic. Take this with food.  Albuterol inhaler 1 to 2 puffs every 6 hours as needed for shortness of breath or wheezing. Prednisone 40 mg (2 tablets) once daily for 3 days. Take this in the morning.  This is a steroid to help with inflammation and pain. Start this tomorrow 2/25 Go to med [facility] today for a chest x-ray.  The order has been placed in the electronic medical records. Rest and stay hydrated. Return to urgent care or PCP if symptoms worsen or fail to resolve.

## 2023-05-14 NOTE — Telephone Encounter (Signed)
 Noted.

## 2023-05-14 NOTE — ED Provider Notes (Signed)
 [facility]    CSN: [id number] Arrival date & time: 05/14/23  1430      History   Chief Complaint Chief Complaint  Patient presents with   Wheezing    Entered by patient    HPI [facility] is a 73 y.o. female.   73 year old female who presents urgent care with complaints of persistent cough, chest congestion, shortness of breath and rattling.  On February 17 she was seen and treated for a respiratory infection with Augmentin, prednisone and Promethazine DM.  She reports that she is continuing to have significant cough and chest congestion with shortness of breath.  She feels like her chest is rattling at night especially.  She denies fevers or chills.  She is eating and drinking well.  She is feeling weak and fatigued.   Wheezing Associated symptoms: chest tightness, cough, fatigue and shortness of breath   Associated symptoms: no chest pain, no ear pain, no fever, no rash and no sore throat     Past Medical History:  Diagnosis Date   Anemia    borderline   Atrial fibrillation (HCC)    a. s/p ablation on 10/05/2016   Cancer (HCC)    cervical/rad hysterectomy/bso wiht chemo for small cell ca   Dyspnea    Heart valve disorder    HLD (hyperlipidemia)    Hypertension    Joint pain    Lower extremity edema    Obesity    Palpitations     Patient Active Problem List   Diagnosis Date Noted   Acute cough 05/03/2022   History of CVA (cerebrovascular accident) 05/28/2021   Hyperlipidemia 06/17/2019   Class 1 obesity due to excess calories with serious comorbidity and body mass index (BMI) of 32.0 to 32.9 in adult 12/05/2018   Postmenopausal 12/05/2018   Family history of diabetes mellitus in father 12/05/2018   Impaired fasting glucose 12/05/2018   Small cell carcinoma (HCC)- of the cervix 12/05/2018   Hip pain, bilateral 05/15/2018   Essential hypertension 01/29/2018   Chronic insomnia 01/29/2018   MDD (major depressive disorder), single episode  01/29/2018   Excessive drinking of alcohol 01/29/2018   Chronic anticoagulation 10/06/2016   Mitral valve disease    PAF (paroxysmal atrial fibrillation) (HCC) 07/01/2013   LAFB (left anterior fascicular block) 07/01/2013   Lymphedema 07/01/2013   h/o Cervical cancer 10/01/2011   H/O total hysterectomy 10/01/2011    Past Surgical History:  Procedure Laterality Date   ABLATION OF DYSRHYTHMIC FOCUS  10/05/2016   ATRIAL FIBRILLATION ABLATION N/A 10/05/2016   Procedure: Atrial Fibrillation Ablation;  Surgeon: Regan Lemming, MD;  Location: [facility] INVASIVE CV LAB;  Service: Cardiovascular;  Laterality: N/A;   IR RADIOLOGY PERIPHERAL GUIDED IV START  09/28/2016   IR US GUIDE VASC ACCESS RIGHT  09/28/2016   RADICAL ABDOMINAL HYSTERECTOMY  1986   with BSO   REPLACEMENT TOTAL KNEE Right 2022   TEE WITHOUT CARDIOVERSION N/A 06/07/2016   Procedure: TRANSESOPHAGEAL ECHOCARDIOGRAM (TEE);  Surgeon: Thurmon Fair, MD;  Location: [facility] ENDOSCOPY;  Service: Cardiovascular;  Laterality: N/A;    OB History     Gravida  1   Para  1   Term      Preterm      AB      Living  1      SAB      IAB      Ectopic      Multiple      Live Births  Home Medications    Prior to Admission medications   Medication Sig Start Date End Date Taking? Authorizing Provider  ALPRAZolam Prudy Feeler) 0.5 MG tablet Take 1 tablet (0.5 mg total) by mouth at bedtime as needed for anxiety. 08/18/20  Yes Boscia, Kathlynn Grate, NP  Cholecalciferol (VITAMIN D3) 125 MCG (5000 UT) CAPS Take 1 capsule by mouth daily.   Yes [provider]  citalopram (CELEXA) 20 MG tablet TAKE 1 TABLET DAILY 04/24/22  Yes Jerene Bears, MD  diltiazem (CARDIZEM CD) 120 MG 24 hr capsule Take 1 capsule (120 mg total) by mouth daily. 04/01/23 06/30/23 Yes Camnitz, Will Daphine Deutscher, MD  ELIQUIS 5 MG TABS tablet TAKE 1 TABLET TWICE A DAY 12/21/22  Yes Camnitz, Will Daphine Deutscher, MD  furosemide (LASIX) 20 MG tablet TAKE 1 TABLET DAILY  03/04/23  Yes Bedsole, Amy E, MD  Multiple Vitamin (MULTIVITAMIN WITH MINERALS) TABS Take 1 tablet by mouth daily.   Yes [provider]  promethazine-dextromethorphan (PROMETHAZINE-DM) 6.25-15 MG/5ML syrup Take 5 mLs by mouth 3 (three) times daily as needed for cough (congestion). 05/06/23  Yes Bing Neighbors, NP  rosuvastatin (CRESTOR) 5 MG tablet Take 1 tablet (5 mg total) by mouth daily. 08/30/22  Yes Bedsole, Amy E, MD  Semaglutide,0.25 or 0.5MG /DOS, (OZEMPIC, 0.25 OR 0.5 MG/DOSE,) 2 MG/1.5ML SOPN Inject 0.25 mg into the skin once a week.   Yes [provider]  traZODone (DESYREL) 100 MG tablet TAKE 1 TABLET AT BEDTIME ASNEEDED FOR SLEEP 03/27/23  Yes Bedsole, Amy E, MD  amoxicillin-clavulanate (AUGMENTIN) 875-125 MG tablet Take 1 tablet by mouth every 12 (twelve) hours. 05/06/23   Bing Neighbors, NP  docusate sodium (COLACE) 100 MG capsule Take 100 mg by mouth daily. Patient not taking: Reported on 05/06/2023    [provider]  ezetimibe (ZETIA) 10 MG tablet Take 1 tablet (10 mg total) by mouth daily. Patient not taking: Reported on 05/06/2023 04/20/22   Excell Seltzer, MD    Family History Family History  Problem Relation Age of Onset   Diabetes Mother    Hypertension Mother    Heart disease Mother    Thyroid disease Mother    Depression Mother    Alcoholism Mother    Skin cancer Father    CVA Father    Diabetes Father    Hypertension Father    Cancer Paternal Grandfather        unknown type    Social History Social History   Tobacco Use   Smoking status: Never   Smokeless tobacco: Never  Vaping Use   Vaping status: Never Used  Substance Use Topics   Alcohol use: Yes    Alcohol/week: 10.0 standard drinks of alcohol    Types: 10 Glasses of wine per week   Drug use: No     Allergies   Patient has no known allergies.   Review of Systems Review of Systems  Constitutional:  Positive for fatigue. Negative for chills and fever.  HENT:   Negative for ear pain and sore throat.   Eyes:  Negative for pain and visual disturbance.  Respiratory:  Positive for cough, chest tightness, shortness of breath and wheezing.   Cardiovascular:  Negative for chest pain and palpitations.  Gastrointestinal:  Negative for abdominal pain and vomiting.  Genitourinary:  Negative for dysuria and hematuria.  Musculoskeletal:  Negative for arthralgias and back pain.  Skin:  Negative for color change and rash.  Neurological:  Negative for seizures and syncope.  All other systems reviewed and are negative.    Physical Exam Triage Vital Signs ED Triage Vitals  Encounter Vitals Group     BP 05/14/23 1441 108/72     Systolic BP Percentile --      Diastolic BP Percentile --      Pulse Rate 05/14/23 1441 69     Resp 05/14/23 1441 17     Temp 05/14/23 1441 98.3 F (36.8 C)     Temp Source 05/14/23 1441 Oral     SpO2 05/14/23 1441 93 %     Weight 05/14/23 1439 150 lb (68 kg)     Height 05/14/23 1439 5\' 3"  (1.6 m)     Head Circumference --      Peak Flow --      Pain Score 05/14/23 1439 0     Pain Loc --      Pain Education --      Exclude from Growth Chart --    No data found.  Updated Vital Signs BP 108/72 (BP Location: Right Arm)   Pulse 69   Temp 98.3 F (36.8 C) (Oral)   Resp 17   Ht 5\' 3"  (1.6 m)   Wt 150 lb (68 kg)   LMP 03/19/1984   SpO2 93%   BMI 26.57 kg/m   Visual Acuity Right Eye Distance:   Left Eye Distance:   Bilateral Distance:    Right Eye Near:   Left Eye Near:    Bilateral Near:     Physical Exam Vitals and nursing note reviewed.  Constitutional:      General: She is not in acute distress.    Appearance: She is well-developed.  HENT:     Head: Normocephalic and atraumatic.  Eyes:     Conjunctiva/sclera: Conjunctivae normal.  Cardiovascular:     Rate and Rhythm: Normal rate and regular rhythm.     Heart sounds: No murmur heard. Pulmonary:     Effort: Pulmonary effort is normal. No respiratory  distress.     Breath sounds: Examination of the right-upper field reveals wheezing and rhonchi. Examination of the left-upper field reveals wheezing and rhonchi. Examination of the right-middle field reveals rhonchi. Examination of the left-middle field reveals rhonchi. Examination of the right-lower field reveals rhonchi. Examination of the left-lower field reveals rhonchi. Wheezing and rhonchi present.  Abdominal:     Palpations: Abdomen is soft.     Tenderness: There is no abdominal tenderness.  Musculoskeletal:        General: No swelling.     Cervical back: Neck supple.  Skin:    General: Skin is warm and dry.     Capillary Refill: Capillary refill takes less than 2 seconds.  Neurological:     Mental Status: She is alert.  Psychiatric:        Mood and Affect: Mood normal.      UC Treatments / Results  Labs (all labs ordered are listed, but only abnormal results are displayed) Labs Reviewed - No data to display  EKG   Radiology No results found.  Procedures Procedures (including critical care time)  Medications Ordered in UC Medications  ipratropium-albuterol (DUONEB) 0.5-2.5 (3) MG/3ML nebulizer solution 3 mL (has no administration in time range)  methylPREDNISolone acetate (DEPO-MEDROL) injection 40 mg (has no administration in time range)    Initial Impression / Assessment and Plan / UC Course  I have reviewed the triage vital signs and the nursing notes.  Pertinent labs & imaging results that  were available during my care of the patient were reviewed by me and considered in my medical decision making (see chart for details).     Acute bronchitis due to other specified organisms  Lower respiratory infection   Symptoms and physical exam findings are most consistent with a persistent pneumonia versus bronchitis.  We will treat with the following: DuoNeb treatment done today in clinic with improvement in lung sounds but they are not normal yet Medrol injection  given today in clinic.  This is a steroid to help with inflammation and improve your airways Azithromycin 250mg  Take 2 tablets today and the 1 tablet daily for 4 more days. This is an antibiotic. Take this with food.  Albuterol inhaler 1 to 2 puffs every 6 hours as needed for shortness of breath or wheezing. Prednisone 40 mg (2 tablets) once daily for 3 days. Take this in the morning.  This is a steroid to help with inflammation and pain. Start this tomorrow 2/25 Go to med [facility] today for a chest x-ray.  The order has been placed in the electronic medical records. Rest and stay hydrated. Return to urgent care or PCP if symptoms worsen or fail to resolve.    Final Clinical Impressions(s) / UC Diagnoses   Final diagnoses:  None   Discharge Instructions   None    ED Prescriptions   None    PDMP not reviewed this encounter.   Landis Martins, [place] 05/14/23 1551

## 2023-05-16 ENCOUNTER — Encounter: Payer: Self-pay | Admitting: Family Medicine

## 2023-05-16 NOTE — Progress Notes (Unsigned)
 Can we send a request to the imaging center where she had her chest x-ray done to have them go ahead and read the chest x-ray.  She has not heard back from this yet and there is no result in the EMR.

## 2023-05-22 ENCOUNTER — Other Ambulatory Visit (HOSPITAL_BASED_OUTPATIENT_CLINIC_OR_DEPARTMENT_OTHER): Payer: Self-pay | Admitting: Obstetrics & Gynecology

## 2023-05-27 ENCOUNTER — Ambulatory Visit (HOSPITAL_BASED_OUTPATIENT_CLINIC_OR_DEPARTMENT_OTHER): Admitting: Obstetrics & Gynecology

## 2023-05-27 ENCOUNTER — Encounter (HOSPITAL_BASED_OUTPATIENT_CLINIC_OR_DEPARTMENT_OTHER): Payer: Self-pay | Admitting: Obstetrics & Gynecology

## 2023-05-27 VITALS — BP 109/61 | HR 61 | Ht 63.0 in | Wt 151.2 lb

## 2023-05-27 DIAGNOSIS — Z8541 Personal history of malignant neoplasm of cervix uteri: Secondary | ICD-10-CM | POA: Diagnosis not present

## 2023-05-27 DIAGNOSIS — F32A Depression, unspecified: Secondary | ICD-10-CM | POA: Diagnosis not present

## 2023-05-27 DIAGNOSIS — C531 Malignant neoplasm of exocervix: Secondary | ICD-10-CM

## 2023-05-27 DIAGNOSIS — Z8659 Personal history of other mental and behavioral disorders: Secondary | ICD-10-CM

## 2023-05-27 DIAGNOSIS — I480 Paroxysmal atrial fibrillation: Secondary | ICD-10-CM

## 2023-05-27 DIAGNOSIS — Z780 Asymptomatic menopausal state: Secondary | ICD-10-CM | POA: Diagnosis not present

## 2023-05-27 DIAGNOSIS — Z01419 Encounter for gynecological examination (general) (routine) without abnormal findings: Secondary | ICD-10-CM | POA: Diagnosis not present

## 2023-05-27 DIAGNOSIS — Z90710 Acquired absence of both cervix and uterus: Secondary | ICD-10-CM

## 2023-05-27 NOTE — Progress Notes (Unsigned)
 Breast and Pelvic Exam Patient name: [facility] MRN [id number]  Date of birth: 1951/02/20 Chief Complaint:   Breast and Pelvic  History of Present Illness:   [facility] is a 73 y.o. G1P1 Caucasian female being seen today for breast and pelvic exam.  Denies vaginal bleeding.  Has some concerns that she may have some prolapse.  Denies urinary symptoms.    Had a CVA 05/2021 after being off eliquis when cardiac ablation treated her afib. She went back in afib and wasn't aware.  She doesn't have any residual symptoms but is on eliquis long term.  On citalopram 20mg  daily.   She would like to consider stopping this.  We discussed a slow wean and instructions written for her.  For now, she does not need a refill.    Patient's last menstrual period was 03/19/1984.   Last pap 07/23/2019. Results were:  normal . H/O abnormal pap: h/o SCC of cervx, h/o Last mammogram: 01/2023.  Results were: normal. Family h/o breast cancer: no Last colonoscopy: 2020. F/u 7 years recommended.       05/27/2023   10:42 AM 01/17/2023    2:21 PM 01/14/2023    2:36 PM 12/19/2021    1:58 PM 10/05/2021    8:11 AM  Depression screen PHQ 2/9  Decreased Interest 0 0 0 0 0  Down, Depressed, Hopeless 0 0 0 0 0  PHQ - 2 Score 0 0 0 0 0  Altered sleeping  0  1 0  Tired, decreased energy  0  1 0  Change in appetite  0  0 0  Feeling bad or failure about yourself   0  0 0  Trouble concentrating  0  0 0  Moving slowly or fidgety/restless  0  0 0  Suicidal thoughts  0  0 0  PHQ-9 Score  0  2 0  Difficult doing work/chores  Not difficult at all  Not difficult at all Not difficult at all    Review of Systems:   Pertinent items are noted in HPI Denies any headaches, blurred vision, fatigue, shortness of breath, chest pain, abdominal pain, abnormal vaginal discharge/itching/odor/irritation, problems with periods, bowel movements, urination, or intercourse unless otherwise stated above. Pertinent History  Reviewed:  Reviewed past medical,surgical, social and family history.  Reviewed problem list, medications and allergies. Physical Assessment:   Vitals:   05/27/23 1100  BP: 109/61  Pulse: 61  Weight: 151 lb 3.2 oz (68.6 kg)  Height: 5\' 3"  (1.6 m)  Body mass index is 26.78 kg/m.        Physical Examination:   General appearance - well appearing, and in no distress  Mental status - alert, oriented to person, place, and time  Psych:  She has a normal mood and affect  Skin - warm and dry, normal color, no suspicious lesions noted  Chest - effort normal, all lung fields clear to auscultation bilaterally  Heart - normal rate and regular rhythm  Neck:  midline trachea, no thyromegaly or nodules  Breasts - breasts appear normal, no suspicious masses, no skin or nipple changes or  axillary nodes  Abdomen - soft, nontender, nondistended, no masses or organomegaly  Pelvic - VULVA: normal appearing vulva with no masses, tenderness or lesions  VAGINA: normal appearing vagina with normal color and discharge, no lesions  CERVIX: normal appearing cervix without discharge or lesions, no CMT  Thin prep pap is not indicated  UTERUS: uterus is felt to be  normal size, shape, consistency and nontender   ADNEXA: No adnexal masses or tenderness noted.  Rectal - normal rectal, good sphincter tone, no masses felt.  Extremities:  No swelling or varicosities noted  Chaperone present for exam  Assessment & Plan:  1. Encntr for gyn exam (general) (routine) w/o abn findings (Primary) ***  2. Postmenopausal ***  3. H/O total hysterectomy ***  4. Malignant neoplasm of exocervix (HCC) ***  5. PAF (paroxysmal atrial fibrillation) (HCC) ***    Meds: No orders of the defined types were placed in this encounter.   Follow-up: No follow-ups on file.  Sigmund Hazel, CMA 05/27/2023 11:01 AM

## 2023-05-27 NOTE — Patient Instructions (Signed)
 Take 1/2 tablet of the celexa daily for two to three months.  After that time, take 1/2 tablet every other day for another month or two.  Then take 1/2 tablet every three days for one more month.  Then stop.

## 2023-06-19 ENCOUNTER — Other Ambulatory Visit: Payer: Self-pay | Admitting: Cardiology

## 2023-06-19 DIAGNOSIS — I480 Paroxysmal atrial fibrillation: Secondary | ICD-10-CM

## 2023-06-19 NOTE — Telephone Encounter (Signed)
 Prescription refill request for Eliquis received. Indication:afib Last office visit:6/24 Scr:0.88  10/24 Age: 73 Weight:68.6  kg  Prescription refilled

## 2023-07-11 ENCOUNTER — Other Ambulatory Visit (HOSPITAL_BASED_OUTPATIENT_CLINIC_OR_DEPARTMENT_OTHER): Payer: Self-pay | Admitting: Obstetrics & Gynecology

## 2023-07-11 ENCOUNTER — Encounter (HOSPITAL_BASED_OUTPATIENT_CLINIC_OR_DEPARTMENT_OTHER): Payer: Self-pay | Admitting: Obstetrics & Gynecology

## 2023-07-11 DIAGNOSIS — N390 Urinary tract infection, site not specified: Secondary | ICD-10-CM

## 2023-07-11 MED ORDER — NITROFURANTOIN MONOHYD MACRO 100 MG PO CAPS: 100.0000 mg | ORAL_CAPSULE | Freq: Every day | ORAL | 1 refills | Status: AC | PRN

## 2023-07-12 ENCOUNTER — Ambulatory Visit
Admission: EM | Admit: 2023-07-12 | Discharge: 2023-07-12 | Disposition: A | Discharge status: Home / Self Care | Attending: Emergency Medicine | Admitting: Emergency Medicine

## 2023-07-12 ENCOUNTER — Other Ambulatory Visit: Payer: Self-pay

## 2023-07-12 VITALS — BP 129/74 | HR 62 | Temp 97.3°F | Resp 20

## 2023-07-12 DIAGNOSIS — N3000 Acute cystitis without hematuria: Secondary | ICD-10-CM

## 2023-07-12 LAB — POCT URINALYSIS DIP (MANUAL ENTRY)
Bilirubin, UA: NEGATIVE
Glucose, UA: NEGATIVE mg/dL
Ketones, POC UA: NEGATIVE mg/dL
Nitrite, UA: NEGATIVE
Protein Ur, POC: NEGATIVE mg/dL
Spec Grav, UA: 1.01 (ref 1.010–1.025)
Urobilinogen, UA: 0.2 U/dL
pH, UA: 6 (ref 5.0–8.0)

## 2023-07-12 MED ORDER — NITROFURANTOIN MONOHYD MACRO 100 MG PO CAPS: 100.0000 mg | ORAL_CAPSULE | Freq: Two times a day (BID) | ORAL | 0 refills | Status: DC

## 2023-07-12 NOTE — ED Provider Notes (Signed)
 [facility]    CSN: [id number] Arrival date & time: 07/12/23  1546      History   Chief Complaint Chief Complaint  Patient presents with   Dysuria    HPI [facility] is a 73 y.o. female.   Patient presents to clinic over concerns of dysuria for the past 3 or 4 days.  She messaged her OB/GYN when this initially happened, as she has taken Macrobid  prophylactically after intercourse in the past, has not had this in many years.  Has some suprapubic pressure with urination and urgency.  Denies hematuria, flank pain, nausea, vomiting or fevers.  The history is provided by the patient and medical records.  Dysuria   Past Medical History:  Diagnosis Date   Anemia    borderline   Atrial fibrillation (HCC)    a. s/p ablation on 10/05/2016   Cancer [facility])    cervical/rad hysterectomy/bso wiht chemo for small cell ca   Dyspnea    Heart valve disorder    HLD (hyperlipidemia)    Hypertension    Joint pain    Lower extremity edema    Obesity    Palpitations     Patient Active Problem List   Diagnosis Date Noted   History of CVA (cerebrovascular accident) 05/28/2021   Hyperlipidemia 06/17/2019   Class 1 obesity due to excess calories with serious comorbidity and body mass index (BMI) of 32.0 to 32.9 in adult 12/05/2018   Postmenopausal 12/05/2018   Family history of diabetes mellitus in father 12/05/2018   Impaired fasting glucose 12/05/2018   Small cell carcinoma (HCC)- of the cervix 12/05/2018   Hip pain, bilateral 05/15/2018   Essential hypertension 01/29/2018   Chronic insomnia 01/29/2018   MDD (major depressive disorder), single episode 01/29/2018   Excessive drinking of alcohol  01/29/2018   Chronic anticoagulation 10/06/2016   Mitral valve disease    PAF (paroxysmal atrial fibrillation) (HCC) 07/01/2013   LAFB (left anterior fascicular block) 07/01/2013   Lymphedema 07/01/2013   h/o Cervical cancer 10/01/2011   H/O total hysterectomy 10/01/2011     Past Surgical History:  Procedure Laterality Date   ABLATION OF DYSRHYTHMIC FOCUS  10/05/2016   ATRIAL FIBRILLATION ABLATION N/A 10/05/2016   Procedure: Atrial Fibrillation Ablation;  Surgeon: Lei Pump, MD;  Location: [facility] INVASIVE CV LAB;  Service: Cardiovascular;  Laterality: N/A;   IR RADIOLOGY PERIPHERAL GUIDED IV START  09/28/2016   IR US  GUIDE VASC ACCESS RIGHT  09/28/2016   RADICAL ABDOMINAL HYSTERECTOMY  1986   with BSO   REPLACEMENT TOTAL KNEE Right 2022   TEE WITHOUT CARDIOVERSION N/A 06/07/2016   Procedure: TRANSESOPHAGEAL ECHOCARDIOGRAM (TEE);  Surgeon: Luana Rumple, MD;  Location: [facility] ENDOSCOPY;  Service: Cardiovascular;  Laterality: N/A;    OB History     Gravida  1   Para  1   Term      Preterm      AB      Living  1      SAB      IAB      Ectopic      Multiple      Live Births               Home Medications    Prior to Admission medications   Medication Sig Start Date End Date Taking? Authorizing Provider  ALPRAZolam  (XANAX ) 0.5 MG tablet Take 1 tablet (0.5 mg total) by mouth at bedtime as needed for anxiety. 08/18/20  Yes Boscia,  Rumaldo Countess, NP  Cholecalciferol (VITAMIN D3) 125 MCG (5000 UT) CAPS Take 1 capsule by mouth daily.   Yes [provider]  citalopram  (CELEXA ) 20 MG tablet TAKE 1 TABLET DAILY 05/23/23  Yes Lillian Rein, MD  diltiazem  (CARDIZEM  CD) 120 MG 24 hr capsule Take 1 capsule (120 mg total) by mouth daily. 04/01/23 07/12/23 Yes Camnitz, Will Gaylyn Keas, MD  docusate sodium  (COLACE) 100 MG capsule Take 100 mg by mouth daily.   Yes [provider]  ELIQUIS  5 MG TABS tablet TAKE 1 TABLET TWICE A DAY 06/19/23  Yes Camnitz, Will Gaylyn Keas, MD  furosemide  (LASIX ) 20 MG tablet TAKE 1 TABLET DAILY 03/04/23  Yes Bedsole, Amy E, MD  Multiple Vitamin (MULTIVITAMIN WITH MINERALS) TABS Take 1 tablet by mouth daily.   Yes [provider]  nitrofurantoin , macrocrystal-monohydrate, (MACROBID ) 100 MG capsule Take  1 capsule (100 mg total) by mouth 2 (two) times daily. 07/12/23  Yes Kelden Lavallee  N, FNP  Semaglutide,0.25 or 0.5MG /DOS, (OZEMPIC, 0.25 OR 0.5 MG/DOSE,) 2 MG/1.5ML SOPN Inject 0.25 mg into the skin once a week.   Yes [provider]  traZODone  (DESYREL ) 100 MG tablet TAKE 1 TABLET AT BEDTIME ASNEEDED FOR SLEEP 03/27/23  Yes Bedsole, Amy E, MD  albuterol  (VENTOLIN  HFA) 108 (90 Base) MCG/ACT inhaler Inhale 1-2 puffs into the lungs every 6 (six) hours as needed for wheezing or shortness of breath. 05/14/23   White, Elizabeth A, PA-C  nitrofurantoin , macrocrystal-monohydrate, (MACROBID ) 100 MG capsule Take 1 capsule (100 mg total) by mouth daily as needed. Take 1 po qd post coital 07/11/23   Lillian Rein, MD  rosuvastatin  (CRESTOR ) 5 MG tablet Take 1 tablet (5 mg total) by mouth daily. 08/30/22   Judithann Novas, MD    Family History Family History  Problem Relation Age of Onset   Diabetes Mother    Hypertension Mother    Heart disease Mother    Thyroid  disease Mother    Depression Mother    Alcoholism Mother    Skin cancer Father    CVA Father    Diabetes Father    Hypertension Father    Cancer Paternal Grandfather        unknown type    Social History Social History   Tobacco Use   Smoking status: Never   Smokeless tobacco: Never  Vaping Use   Vaping status: Never Used  Substance Use Topics   Alcohol  use: Yes    Alcohol /week: 10.0 standard drinks of alcohol     Types: 10 Glasses of wine per week   Drug use: No     Allergies   Patient has no known allergies.   Review of Systems Review of Systems  Per HPI  Physical Exam Triage Vital Signs ED Triage Vitals  Encounter Vitals Group     BP 07/12/23 1557 129/74     Systolic BP Percentile --      Diastolic BP Percentile --      Pulse Rate 07/12/23 1557 62     Resp 07/12/23 1557 20     Temp 07/12/23 1557 (!) 97.3 F (36.3 C)     Temp src --      SpO2 07/12/23 1557 96 %     Weight --      Height --       Head Circumference --      Peak Flow --      Pain Score 07/12/23 1553 0     Pain Loc --  Pain Education --      Exclude from Growth Chart --    No data found.  Updated Vital Signs BP 129/74   Pulse 62   Temp (!) 97.3 F (36.3 C) Comment: drinking water  Resp 20   LMP 03/19/1984   SpO2 96%   Visual Acuity Right Eye Distance:   Left Eye Distance:   Bilateral Distance:    Right Eye Near:   Left Eye Near:    Bilateral Near:     Physical Exam Vitals and nursing note reviewed.  Constitutional:      Appearance: Normal appearance.  HENT:     Head: Normocephalic and atraumatic.     Right Ear: External ear normal.     Left Ear: External ear normal.     Nose: Nose normal.     Mouth/Throat:     Mouth: Mucous membranes are moist.  Eyes:     Conjunctiva/sclera: Conjunctivae normal.  Cardiovascular:     Rate and Rhythm: Normal rate.  Pulmonary:     Effort: Pulmonary effort is normal. No respiratory distress.  Abdominal:     Tenderness: There is no right CVA tenderness or left CVA tenderness.  Neurological:     General: No focal deficit present.     Mental Status: She is alert.  Psychiatric:        Mood and Affect: Mood normal.      UC Treatments / Results  Labs (all labs ordered are listed, but only abnormal results are displayed) Labs Reviewed  POCT URINALYSIS DIP (MANUAL ENTRY) - Abnormal; Notable for the following components:      Result Value   Blood, UA trace-intact (*)    Leukocytes, UA Moderate (2+) (*)    All other components within normal limits  URINE CULTURE    EKG   Radiology No results found.  Procedures Procedures (including critical care time)  Medications Ordered in UC Medications - No data to display  Initial Impression / Assessment and Plan / UC Course  I have reviewed the triage vital signs and the nursing notes.  Pertinent labs & imaging results that were available during my care of the patient were reviewed by me and  considered in my medical decision making (see chart for details).  Vitals in triage reviewed, patient is hemodynamically stable.  Negative for CVA tenderness, without tachycardia, fevers or flank pain, low concern for pyelonephritis.  UA shows leukocytes and red blood cells, concerning for acute cystitis without hematuria.  Will place on Macrobid , as patient has tolerated this well in the past, normal renal function 5 months ago.  Urine sent for culture and staff will contact if treatment plan needs to be updated.  Plan of care, follow-up care return precautions given, no questions at this time.     Final Clinical Impressions(s) / UC Diagnoses   Final diagnoses:  Acute cystitis without hematuria     Discharge Instructions      Your urine sample showed evidence of a urinary tract infection.  Take the Macrobid  twice daily with food for the next 5 days.  Ensure you are drinking at least 64 ounces of water.  Over-the-counter AZO or Pyridium can help with the burning sensation with urination.  Symptoms should improve with antibiotics.  We have sent your urine off for culture and we will contact you if we need to modify treatment.  Return to clinic for any new or urgent symptoms.     ED Prescriptions  Medication Sig Dispense Auth. Provider   nitrofurantoin , macrocrystal-monohydrate, (MACROBID ) 100 MG capsule Take 1 capsule (100 mg total) by mouth 2 (two) times daily. 10 capsule Harlow Lighter, Draeden Kellman  N, FNP      PDMP not reviewed this encounter.   Harlow Lighter, Tanice Petre  N, FNP 07/12/23 [phone]

## 2023-07-12 NOTE — ED Triage Notes (Signed)
 Pt reports having dysuria for a couple of days .

## 2023-07-12 NOTE — Discharge Instructions (Signed)
 Your urine sample showed evidence of a urinary tract infection.  Take the Macrobid  twice daily with food for the next 5 days.  Ensure you are drinking at least 64 ounces of water.  Over-the-counter AZO or Pyridium can help with the burning sensation with urination.  Symptoms should improve with antibiotics.  We have sent your urine off for culture and we will contact you if we need to modify treatment.  Return to clinic for any new or urgent symptoms.

## 2023-07-14 LAB — URINE CULTURE: Culture: <10000 — AB

## 2023-08-05 ENCOUNTER — Encounter: Payer: Self-pay | Admitting: Cardiology

## 2023-08-07 NOTE — Telephone Encounter (Signed)
 Discussed with pt.   States she has been in and out of afib the past few months.  She is having HAs as well and the internet said it could be a problem with her heart, and Dr. Lawana Pray told her several years ago that she had something going on with one of her valves but that nothing needed to be done then.  Clarified the valve issue he was referring to.   Aware that she is due for follow up anyway and we could discuss further possible testing needs at an office visit.  Aware scheduler will contact her to arrange OV w/ Brandy Ollis/Dr. Lawana Pray (which ever has sooner opening).

## 2023-08-24 NOTE — Progress Notes (Unsigned)
 Electrophysiology Office Note:   Date:  08/26/2023  ID:  [facility], DOB [birth year], MRN [id number]  Primary Cardiologist: None Primary Heart Failure: None Electrophysiologist: Medard Decuir Cortland Ding, MD      History of Present Illness:   [facility] is a 73 y.o. female with h/o atrial fibrillation, hypertension, PVCs, CVA seen today for routine electrophysiology followup.   Since last being seen in our clinic the patient reports doing overall well aside from an increased burden of atrial fibrillation.  There are times when she has episodes that last for a few hours.  This happens multiple times a week.  When she is not in atrial fibrillation she has no acute complaints, but does note fatigue, shortness of breath, palpitations during A-fib episodes.  She is converted to sinus rhythm without any intervention.  she denies chest pain, palpitations, dyspnea, PND, orthopnea, nausea, vomiting, dizziness, syncope, edema, weight gain, or early satiety.   Review of systems complete and found to be negative unless listed in HPI.   EP Information / Studies Reviewed:    EKG is ordered today. Personal review as below.  EKG Interpretation Date/Time:  Monday August 26 2023 12:44:08 EDT Ventricular Rate:  65 PR Interval:  282 QRS Duration:  118 QT Interval:  458 QTC Calculation: 476 R Axis:   -68  Text Interpretation: Sinus rhythm with 1st degree A-V block Left axis deviation Left ventricular hypertrophy with QRS widening and repolarization abnormality ( R in aVL , Cornell product ) When compared with ECG of 29-Aug-2021 15:26, No significant change was found Confirmed by [facility] ([id number]) on 08/26/2023 12:46:16 PM     Risk Assessment/Calculations:    CHA2DS2-VASc Score = 5   This indicates a 7.2% annual risk of stroke. The patient's score is based upon: CHF History: 0 HTN History: 1 Diabetes History: 0 Stroke History: 2 Vascular Disease History: 0 Age Score: 1 Gender Score: 1              Physical Exam:   VS:  BP 130/82 (BP Location: Left Arm, Patient Position: Sitting, Cuff Size: Normal)   Pulse 66   Ht 5\' 3"  (1.6 m)   Wt 152 lb 3.2 oz (69 kg)   LMP 03/19/1984   SpO2 98%   BMI 26.96 kg/m    Wt Readings from Last 3 Encounters:  08/26/23 152 lb 3.2 oz (69 kg)  05/27/23 151 lb 3.2 oz (68.6 kg)  05/14/23 150 lb (68 kg)     GEN: Well nourished, well developed in no acute distress NECK: No JVD; No carotid bruits CARDIAC: Regular rate and rhythm, no murmurs, rubs, gallops RESPIRATORY:  Clear to auscultation without rales, wheezing or rhonchi  ABDOMEN: Soft, non-tender, non-distended EXTREMITIES:  No edema; No deformity   ASSESSMENT AND PLAN:    1.  Paroxysmal atrial fibrillation: Post ablation 10/05/2016.  On Eliquis .  She had a stroke when her Eliquis  was stopped.  She has had more frequent episodes of atrial fibrillation.  She would prefer rhythm control.  She would like to avoid long-term antiarrhythmics.  In addition, she is on medications that may interact with antiarrhythmics.  Due to that, we Cynara Tatham plan for ablation.  Risks and benefits have been discussed.  Risk, benefits, and alternatives to EP study and radiofrequency/pulse field ablation for afib were also discussed in detail today. These risks include but are not limited to stroke, bleeding, vascular damage, tamponade, perforation, damage to the esophagus, lungs, and other structures, pulmonary vein stenosis,  worsening renal function, and death. The patient understands these risk and wishes to proceed.  We Kauri Garson therefore proceed with catheter ablation at the next available time.  Carto, ICE, anesthesia are requested for the procedure.  Breann Losano also obtain CT PV protocol prior to the procedure to exclude LAA thrombus and further evaluate atrial anatomy.  2.  PVCs: Minimal  3.  Hypertension: Well-controlled  4.  Mitral regurgitation: Moderate on echo.  Last echo was 2 years ago.  Kay Shippy repeat echo.  5.   Second hypercoagulable state: On Eliquis   Follow up with Afib Clinic as usual post procedure  Signed, Gertrude Bucks Cortland Ding, MD

## 2023-08-26 ENCOUNTER — Encounter: Payer: Self-pay | Admitting: Cardiology

## 2023-08-26 ENCOUNTER — Ambulatory Visit: Attending: Internal Medicine | Admitting: Cardiology

## 2023-08-26 VITALS — BP 130/82 | HR 66 | Ht 63.0 in | Wt 152.2 lb

## 2023-08-26 DIAGNOSIS — I480 Paroxysmal atrial fibrillation: Secondary | ICD-10-CM | POA: Insufficient documentation

## 2023-08-26 DIAGNOSIS — D6869 Other thrombophilia: Secondary | ICD-10-CM | POA: Diagnosis not present

## 2023-08-26 DIAGNOSIS — I340 Nonrheumatic mitral (valve) insufficiency: Secondary | ICD-10-CM | POA: Diagnosis not present

## 2023-08-26 DIAGNOSIS — I10 Essential (primary) hypertension: Secondary | ICD-10-CM | POA: Insufficient documentation

## 2023-08-26 DIAGNOSIS — I493 Ventricular premature depolarization: Secondary | ICD-10-CM | POA: Insufficient documentation

## 2023-08-26 NOTE — Patient Instructions (Addendum)
 Medication Instructions:  Your physician has recommended you make the following change in your medication:  Would like to start you on Amiodarone --- you will need to continue to wean off your Celexa .  Please let us  know when so that we may determine when to start this new medication.  (If you have to restart a different depression medication while taking Amiodarone, it will need to be an SNRI)  *If you need a refill on your cardiac medications before your next appointment, please call your pharmacy*   Lab Work: Pre procedure labs -- we will call you to schedule:  BMP & CBC  If you have a lab test that is abnormal and we need to change your treatment, we will call you to review the results -- otherwise no news is good news.    Testing/Procedures: Your physician has requested that you have an echocardiogram. Echocardiography is a painless test that uses sound waves to create images of your heart. It provides your doctor with information about the size and shape of your heart and how well your heart's chambers and valves are working. This procedure takes approximately one hour. There are no restrictions for this procedure. Please do NOT wear cologne, perfume, aftershave, or lotions (deodorant is allowed). Please arrive 15 minutes prior to your appointment time.  Please note: We ask at that you not bring children with you during ultrasound (echo/ vascular) testing. Due to room size and safety concerns, children are not allowed in the ultrasound rooms during exams. Our front office staff cannot provide observation of children in our lobby area while testing is being conducted. An adult accompanying a patient to their appointment will only be allowed in the ultrasound room at the discretion of the ultrasound technician under special circumstances. We apologize for any inconvenience.  Your physician has requested that you have cardiac CT 3 weeks PRIOR to your ablation. Cardiac computed tomography (CT)  is a painless test that uses an x-ray machine to take clear, detailed pictures of your heart. We will contact you if the result is abnormal. We will call you to schedule.  Your physician has recommended that you have an ablation. Catheter ablation is a medical procedure used to treat some cardiac arrhythmias (irregular heartbeats). During catheter ablation, a long, thin, flexible tube is put into a blood vessel in your groin (upper thigh), or neck. This tube is called an ablation catheter. It is then guided to your heart through the blood vessel. Radio frequency waves destroy small areas of heart tissue where abnormal heartbeats may cause an arrhythmia to start.   Your ablation is scheduled for _________. Please arrive at [facility] at _________ am.  We will call/send instructions at a later date.   Follow-Up: At [facility], you and your health needs are our priority.  As part of our continuing mission to provide you with exceptional heart care, we have created designated Provider Care Teams.  These Care Teams include your primary Cardiologist (physician) and Advanced Practice Providers (APPs -  Physician Assistants and Nurse Practitioners) who all work together to provide you with the care you need, when you need it.  Your next appointment:   1 month(s) after your ablation  The format for your next appointment:   In Person  Provider:   AF[facility] clinic   Thank you for choosing [facility]!!   Reece Cane, RN [phone]    Other Instructions   Cardiac Ablation Cardiac ablation is a procedure to destroy (  ablate) some heart tissue that is sending bad signals. These bad signals cause problems in heart rhythm. The heart has many areas that make these signals. If there are problems in these areas, they can make the heart beat in a way that is not normal. Destroying some tissues can help make the heart rhythm normal. Tell your doctor about: Any allergies you have. All  medicines you are taking. These include vitamins, herbs, eye drops, creams, and over-the-counter medicines. Any problems you or family members have had with medicines that make you fall asleep (anesthetics). Any blood disorders you have. Any surgeries you have had. Any medical conditions you have, such as kidney failure. Whether you are pregnant or may be pregnant. What are the risks? This is a safe procedure. But problems may occur, including: Infection. Bruising and bleeding. Bleeding into the chest. Stroke or blood clots. Damage to nearby areas of your body. Allergies to medicines or dyes. The need for a pacemaker if the normal system is damaged. Failure of the procedure to treat the problem. What happens before the procedure? Medicines Ask your doctor about: Changing or stopping your normal medicines. This is important. Taking aspirin  and ibuprofen . Do not take these medicines unless your doctor tells you to take them. Taking other medicines, vitamins, herbs, and supplements. General instructions Follow instructions from your doctor about what you cannot eat or drink. Plan to have someone take you home from the hospital or clinic. If you will be going home right after the procedure, plan to have someone with you for 24 hours. Ask your doctor what steps will be taken to prevent infection. What happens during the procedure?  An IV tube will be put into one of your veins. You will be given a medicine to help you relax. The skin on your neck or groin will be numbed. A cut (incision) will be made in your neck or groin. A needle will be put through your cut and into a large vein. A tube (catheter) will be put into the needle. The tube will be moved to your heart. Dye may be put through the tube. This helps your doctor see your heart. Small devices (electrodes) on the tube will send out signals. A type of energy will be used to destroy some heart tissue. The tube will be taken  out. Pressure will be held on your cut. This helps stop bleeding. A bandage will be put over your cut. The exact procedure may vary among doctors and hospitals. What happens after the procedure? You will be watched until you leave the hospital or clinic. This includes checking your heart rate, breathing rate, oxygen, and blood pressure. Your cut will be watched for bleeding. You will need to lie still for a few hours. Do not drive for 24 hours or as long as your doctor tells you. Summary Cardiac ablation is a procedure to destroy some heart tissue. This is done to treat heart rhythm problems. Tell your doctor about any medical conditions you may have. Tell him or her about all medicines you are taking to treat them. This is a safe procedure. But problems may occur. These include infection, bruising, bleeding, and damage to nearby areas of your body. Follow what your doctor tells you about food and drink. You may also be told to change or stop some of your medicines. After the procedure, do not drive for 24 hours or as long as your doctor tells you. This information is not intended to replace advice given  to you by your health care provider. Make sure you discuss any questions you have with your health care provider. Document Revised: 05/26/2021 Document Reviewed: 02/05/2019 Elsevier Patient Education  2023 Elsevier Inc.   Cardiac Ablation, Care After  This sheet gives you information about how to care for yourself after your procedure. Your health care provider may also give you more specific instructions. If you have problems or questions, contact your health care provider. What can I expect after the procedure? After the procedure, it is common to have: Bruising around your puncture site. Tenderness around your puncture site. Skipped heartbeats. If you had an atrial fibrillation ablation, you may have atrial fibrillation during the first several months after your procedure.  Tiredness  (fatigue).  Follow these instructions at home: Puncture site care  Follow instructions from your health care provider about how to take care of your puncture site. Make sure you: If present, leave stitches (sutures), skin glue, or adhesive strips in place. These skin closures may need to stay in place for up to 2 weeks. If adhesive strip edges start to loosen and curl up, you may trim the loose edges. Do not remove adhesive strips completely unless your health care provider tells you to do that. If a large square bandage is present, this may be removed 24 hours after surgery.  Check your puncture site every day for signs of infection. Check for: Redness, swelling, or pain. Fluid or blood. If your puncture site starts to bleed, lie down on your back, apply firm pressure to the area, and contact your health care provider. Warmth. Pus or a bad smell. A pea or small marble sized lump at the site is normal and can take up to three months to resolve.  Driving Do not drive for at least 4 days after your procedure or however long your health care provider recommends. (Do not resume driving if you have previously been instructed not to drive for other health reasons.) Do not drive or use heavy machinery while taking prescription pain medicine. Activity Avoid activities that take a lot of effort for at least 7 days after your procedure. Do not lift anything that is heavier than 5 lb (4.5 kg) for one week.  No sexual activity for 1 week.  Return to your normal activities as told by your health care provider. Ask your health care provider what activities are safe for you. General instructions Take over-the-counter and prescription medicines only as told by your health care provider. Do not use any products that contain nicotine or tobacco, such as cigarettes and e-cigarettes. If you need help quitting, ask your health care provider. You may shower after 24 hours, but Do not take baths, swim, or use a hot  tub for 1 week.  Do not drink alcohol  for 24 hours after your procedure. Keep all follow-up visits as told by your health care provider. This is important. Contact a health care provider if: You have redness, mild swelling, or pain around your puncture site. You have fluid or blood coming from your puncture site that stops after applying firm pressure to the area. Your puncture site feels warm to the touch. You have pus or a bad smell coming from your puncture site. You have a fever. You have chest pain or discomfort that spreads to your neck, jaw, or arm. You have chest pain that is worse with lying on your back or taking a deep breath. You are sweating a lot. You feel nauseous. You have  a fast or irregular heartbeat. You have shortness of breath. You are dizzy or light-headed and feel the need to lie down. You have pain or numbness in the arm or leg closest to your puncture site. Get help right away if: Your puncture site suddenly swells. Your puncture site is bleeding and the bleeding does not stop after applying firm pressure to the area. These symptoms may represent a serious problem that is an emergency. Do not wait to see if the symptoms will go away. Get medical help right away. Call your local emergency services (911 in the [place] Do not drive yourself to the hospital. Summary After the procedure, it is normal to have bruising and tenderness at the puncture site in your groin, neck, or forearm. Check your puncture site every day for signs of infection. Get help right away if your puncture site is bleeding and the bleeding does not stop after applying firm pressure to the area. This is a medical emergency. This information is not intended to replace advice given to you by your health care provider. Make sure you discuss any questions you have with your health care provider.

## 2023-09-04 ENCOUNTER — Other Ambulatory Visit: Payer: Self-pay | Admitting: Family Medicine

## 2023-09-18 ENCOUNTER — Other Ambulatory Visit: Payer: Self-pay | Admitting: Family Medicine

## 2023-09-18 ENCOUNTER — Ambulatory Visit (HOSPITAL_COMMUNITY)
Admission: RE | Admit: 2023-09-18 | Discharge: 2023-09-18 | Disposition: A | Discharge status: Home / Self Care | Source: Ambulatory Visit | Attending: Cardiology | Admitting: Cardiology

## 2023-09-18 DIAGNOSIS — I480 Paroxysmal atrial fibrillation: Secondary | ICD-10-CM

## 2023-09-18 DIAGNOSIS — I890 Lymphedema, not elsewhere classified: Secondary | ICD-10-CM

## 2023-09-18 LAB — ECHOCARDIOGRAM COMPLETE
Area-P 1/2: 3.19 cm2
MV M vel: 5.95 m/s
MV Peak grad: 141.6 mmHg
Radius: 0.4 cm
S' Lateral: 2.1 cm

## 2023-09-23 ENCOUNTER — Ambulatory Visit: Payer: Self-pay | Admitting: Cardiology

## 2023-10-04 ENCOUNTER — Encounter: Payer: Self-pay | Admitting: Family Medicine

## 2023-10-04 NOTE — Telephone Encounter (Signed)
 Left detailed message informing her I would respond to her via mychart and she can respond back of call me next week.

## 2023-10-08 ENCOUNTER — Other Ambulatory Visit (HOSPITAL_COMMUNITY)

## 2023-10-14 MED ORDER — AMIODARONE HCL 200 MG PO TABS: ORAL_TABLET | ORAL | 3 refills | Status: DC

## 2023-10-14 MED ORDER — AMIODARONE HCL 200 MG PO TABS: 200.0000 mg | ORAL_TABLET | Freq: Every day | ORAL | 1 refills | Status: DC

## 2023-10-14 MED ORDER — AMIODARONE HCL 200 MG PO TABS: ORAL_TABLET | ORAL | 0 refills | Status: DC

## 2023-10-15 DIAGNOSIS — H25043 Posterior subcapsular polar age-related cataract, bilateral: Secondary | ICD-10-CM | POA: Diagnosis not present

## 2023-10-15 DIAGNOSIS — H25013 Cortical age-related cataract, bilateral: Secondary | ICD-10-CM | POA: Diagnosis not present

## 2023-10-15 DIAGNOSIS — H2513 Age-related nuclear cataract, bilateral: Secondary | ICD-10-CM | POA: Diagnosis not present

## 2023-10-15 DIAGNOSIS — H35413 Lattice degeneration of retina, bilateral: Secondary | ICD-10-CM | POA: Diagnosis not present

## 2023-10-16 ENCOUNTER — Encounter: Payer: Self-pay | Admitting: Family Medicine

## 2023-10-16 ENCOUNTER — Ambulatory Visit (INDEPENDENT_AMBULATORY_CARE_PROVIDER_SITE_OTHER): Admitting: Family Medicine

## 2023-10-16 VITALS — BP 122/69 | HR 73 | Temp 97.4°F | Ht 63.0 in | Wt 157.0 lb

## 2023-10-16 DIAGNOSIS — R42 Dizziness and giddiness: Secondary | ICD-10-CM | POA: Diagnosis not present

## 2023-10-16 DIAGNOSIS — R5383 Other fatigue: Secondary | ICD-10-CM | POA: Diagnosis not present

## 2023-10-16 DIAGNOSIS — H9313 Tinnitus, bilateral: Secondary | ICD-10-CM | POA: Diagnosis not present

## 2023-10-16 DIAGNOSIS — R519 Headache, unspecified: Secondary | ICD-10-CM

## 2023-10-16 LAB — COMPREHENSIVE METABOLIC PANEL WITH GFR
ALT: 6 U/L (ref 0–35)
AST: 19 U/L (ref 0–37)
Albumin: 4.3 g/dL (ref 3.5–5.2)
Alkaline Phosphatase: 57 U/L (ref 39–117)
BUN: 7 mg/dL (ref 6–23)
CO2: 31 meq/L (ref 19–32)
Calcium: 9.5 mg/dL (ref 8.4–10.5)
Chloride: 102 meq/L (ref 96–112)
Creatinine, Ser: 0.8 mg/dL (ref 0.40–1.20)
GFR: 73.41 mL/min (ref >60.00)
Glucose, Bld: 89 mg/dL (ref 70–99)
Potassium: 4.5 meq/L (ref 3.5–5.1)
Sodium: 141 meq/L (ref 135–145)
Total Bilirubin: 0.8 mg/dL (ref 0.2–1.2)
Total Protein: 6.6 g/dL (ref 6.0–8.3)

## 2023-10-16 LAB — TSH: TSH: 3.61 u[IU]/mL (ref 0.35–5.50)

## 2023-10-16 LAB — CBC WITH DIFFERENTIAL/PLATELET
Basophils Absolute: 0 K/uL (ref 0.0–0.1)
Basophils Relative: 0.6 % (ref 0.0–3.0)
Eosinophils Absolute: 0.1 K/uL (ref 0.0–0.7)
Eosinophils Relative: 2.3 % (ref 0.0–5.0)
HCT: 40.6 % (ref 36.0–46.0)
Hemoglobin: 13.6 g/dL (ref 12.0–15.0)
Lymphocytes Relative: 30.9 % (ref 12.0–46.0)
Lymphs Abs: 1.6 K/uL (ref 0.7–4.0)
MCHC: 33.4 g/dL (ref 30.0–36.0)
MCV: 95.4 fl (ref 78.0–100.0)
Monocytes Absolute: 0.5 K/uL (ref 0.1–1.0)
Monocytes Relative: 9.8 % (ref 3.0–12.0)
Neutro Abs: 2.9 K/uL (ref 1.4–7.7)
Neutrophils Relative %: 56.4 % (ref 43.0–77.0)
Platelets: 453 K/uL — ABNORMAL HIGH (ref 150.0–400.0)
RBC: 4.26 Mil/uL (ref 3.87–5.11)
RDW: 12.9 % (ref 11.5–15.5)
WBC: 5.1 K/uL (ref 4.0–10.5)

## 2023-10-16 LAB — VITAMIN B12: Vitamin B-12: 919 pg/mL — ABNORMAL HIGH (ref 211–911)

## 2023-10-16 NOTE — Progress Notes (Signed)
 Patient ID: Savannah Hill, female    DOB: Nov 14, 1950, 73 y.o.   MRN: [id number]  This visit was conducted in person.  BP 122/69   Pulse 73   Temp (!) 97.4 F (36.3 C) (Temporal)   Ht 5' 3 (1.6 m)   Wt 157 lb (71.2 kg)   LMP 03/19/1984   SpO2 98%   BMI 27.81 kg/m    CC:  Chief Complaint  Patient presents with   Tinnitus    Feels like she is having electric shocks in her head x 2 months   Headache    Subjective:   HPI: Savannah Hill is a, CVA 73 y.o. female presenting on 10/16/2023 for Tinnitus (Feels like she is having electric shocks in her head x 2 months) and Headache    New onset  ringing in  head, buzzing in ears  Feeling electric jolts going off in brain.  Ongoing x 6 weeks, fairly constantly.  Associated with headache at times, pounding in head.  Dizziness with standing at time.  Fatgiue and s hort of breath with exertion at times. going up stairs... associates with being in afib. Hears heartbeat in ears, both  No hearing loss.  Minimal nasal congestion, occ sneeze.  No new neuro changes.  No vision changes.  No hearing loss  No head injury  She has been trying to wear off citalopram . Has been off now for 3 weeks.  History of HTN, PAF, CVA 2023  On Eliquis  anticoagulation  Back in  atrial fibrillation.. Dr.Camnitz.  Started last week on amiodarone   Scheduled for heart ablation. BP Readings from Last 3 Encounters:  10/16/23 122/69  08/26/23 130/82  07/12/23 129/74    Prediabetes on semaglutide for weight loss Overdue for A1C   NML MRA head and neck in 2023     Relevant past medical, surgical, family and social history reviewed and updated as indicated. Interim medical history since our last visit reviewed. Allergies and medications reviewed and updated. Outpatient Medications Prior to Visit  Medication Sig Dispense Refill   ALPRAZolam  (XANAX ) 0.5 MG tablet Take 1 tablet (0.5 mg total) by mouth at bedtime as needed for anxiety. 90  tablet 0   [START ON 11/14/2023] amiodarone  (PACERONE ) 200 MG tablet Take 1 tablet (200 mg total) by mouth daily. Take 2 tablets (400 mg total) TWICE a day for 2 weeks, then take 1 tablet (200 mg total) TWICE a day for 2 weeks, then take 1 tablet (200 mg total) ONCE a day thereafter 90 tablet 1   Cholecalciferol (VITAMIN D3) 125 MCG (5000 UT) CAPS Take 1 capsule by mouth daily.     diltiazem  (CARDIZEM  CD) 120 MG 24 hr capsule Take 120 mg by mouth in the morning and at bedtime.     docusate sodium  (COLACE) 100 MG capsule Take 100 mg by mouth daily.     ELIQUIS  5 MG TABS tablet TAKE 1 TABLET TWICE A DAY 180 tablet 1   furosemide  (LASIX ) 20 MG tablet TAKE 1 TABLET DAILY 90 tablet 0   Multiple Vitamin (MULTIVITAMIN WITH MINERALS) TABS Take 1 tablet by mouth daily.     nitrofurantoin , macrocrystal-monohydrate, (MACROBID ) 100 MG capsule Take 1 capsule (100 mg total) by mouth daily as needed. Take 1 po qd post coital 90 capsule 1   nitrofurantoin , macrocrystal-monohydrate, (MACROBID ) 100 MG capsule Take 1 capsule (100 mg total) by mouth 2 (two) times daily. 10 capsule 0   rosuvastatin  (CRESTOR ) 5 MG tablet TAKE 1  TABLET DAILY 90 tablet 1   Semaglutide,0.25 or 0.5MG /DOS, (OZEMPIC, 0.25 OR 0.5 MG/DOSE,) 2 MG/1.5ML SOPN Inject 0.25 mg into the skin as directed. Pt takes every two weeks     traZODone  (DESYREL ) 100 MG tablet TAKE 1 TABLET AT BEDTIME ASNEEDED FOR SLEEP 90 tablet 1   albuterol  (VENTOLIN  HFA) 108 (90 Base) MCG/ACT inhaler Inhale 1-2 puffs into the lungs every 6 (six) hours as needed for wheezing or shortness of breath. 6.7 g 0   diltiazem  (CARDIZEM  CD) 120 MG 24 hr capsule Take 1 capsule (120 mg total) by mouth daily. (Patient taking differently: Take 120 mg by mouth 2 (two) times daily.) 90 capsule 2   No facility-administered medications prior to visit.     Per HPI unless specifically indicated in ROS section below Review of Systems  Constitutional:  Positive for fatigue. Negative for fever.   HENT:  Positive for tinnitus. Negative for congestion, ear discharge and ear pain.   Eyes:  Negative for pain.  Respiratory:  Negative for cough and shortness of breath.   Cardiovascular:  Negative for chest pain, palpitations and leg swelling.  Gastrointestinal:  Negative for abdominal pain.  Genitourinary:  Negative for dysuria and vaginal bleeding.  Musculoskeletal:  Negative for back pain.  Neurological:  Positive for dizziness, light-headedness and headaches. Negative for syncope.  Psychiatric/Behavioral:  Negative for dysphoric mood.    Objective:  BP 122/69   Pulse 73   Temp (!) 97.4 F (36.3 C) (Temporal)   Ht 5' 3 (1.6 m)   Wt 157 lb (71.2 kg)   LMP 03/19/1984   SpO2 98%   BMI 27.81 kg/m   Wt Readings from Last 3 Encounters:  10/16/23 157 lb (71.2 kg)  08/26/23 152 lb 3.2 oz (69 kg)  05/27/23 151 lb 3.2 oz (68.6 kg)      Physical Exam Constitutional:      General: She is not in acute distress.    Appearance: Normal appearance. She is well-developed. She is not ill-appearing or toxic-appearing.  HENT:     Head: Normocephalic.     Right Ear: Hearing, tympanic membrane, ear canal and external ear normal. Tympanic membrane is not erythematous, retracted or bulging.     Left Ear: Hearing, tympanic membrane, ear canal and external ear normal. Tympanic membrane is not erythematous, retracted or bulging.     Nose: No mucosal edema or rhinorrhea.     Right Sinus: No maxillary sinus tenderness or frontal sinus tenderness.     Left Sinus: No maxillary sinus tenderness or frontal sinus tenderness.     Mouth/Throat:     Pharynx: Uvula midline.  Eyes:     General: Lids are normal. Lids are everted, no foreign bodies appreciated.     Conjunctiva/sclera: Conjunctivae normal.     Pupils: Pupils are equal, round, and reactive to light.  Neck:     Thyroid : No thyroid  mass or thyromegaly.     Vascular: No carotid bruit.     Trachea: Trachea normal.  Cardiovascular:     Rate  and Rhythm: Normal rate and regular rhythm.     Pulses: Normal pulses.     Heart sounds: Normal heart sounds, S1 normal and S2 normal. No murmur heard.    No friction rub. No gallop.  Pulmonary:     Effort: Pulmonary effort is normal. No tachypnea or respiratory distress.     Breath sounds: Normal breath sounds. No decreased breath sounds, wheezing, rhonchi or rales.  Abdominal:  General: Bowel sounds are normal.     Palpations: Abdomen is soft.     Tenderness: There is no abdominal tenderness.  Musculoskeletal:     Cervical back: Normal range of motion and neck supple.  Skin:    General: Skin is warm and dry.     Findings: No rash.  Neurological:     Mental Status: She is alert and oriented to person, place, and time.     GCS: GCS eye subscore is 4. GCS verbal subscore is 5. GCS motor subscore is 6.     Cranial Nerves: No cranial nerve deficit.     Sensory: No sensory deficit.     Motor: No abnormal muscle tone.     Coordination: Coordination normal.     Gait: Gait normal.     Deep Tendon Reflexes: Reflexes are normal and symmetric.     Comments: Nml cerebellar exam   No papilledema  Psychiatric:        Mood and Affect: Mood is not anxious or depressed.        Speech: Speech normal.        Behavior: Behavior normal. Behavior is cooperative.        Thought Content: Thought content normal.        Cognition and Memory: Memory is not impaired. She does not exhibit impaired recent memory or impaired remote memory.        Judgment: Judgment normal.       Results for orders placed or performed during the hospital encounter of 09/18/23  ECHOCARDIOGRAM COMPLETE   Collection Time: 09/18/23  1:59 PM  Result Value Ref Range   Area-P 1/2 3.19 cm2   S' Lateral 2.10 cm   Radius 0.40 cm   MV M vel 5.95 m/s   MV Peak grad 141.6 mmHg   Est EF 50 - 55%     Assessment and Plan  Acute intractable headache, unspecified headache type  Tinnitus of both ears  Dizziness -      Comprehensive metabolic panel with GFR -     CBC with Differential/Platelet -     Vitamin B12  Other fatigue -     TSH   Unclear etiology of symptoms.  No cerumen impaction or tympanic membrane findings. Will evaluate with labs for secondary causes of dizziness, fatigue and ear ringing. If not improving can consider treatment for possible atypical sinus infection. No red flags for intracranial hemorrhage or mass but if symptoms not improving can consider head CT to evaluate. Of note symptoms not typical of vascular source of tinnitus and patient with MRI angiogram head and neck in 2023 that showed no evidence of aneurysm. Patient agreeable to plan.  Return and ER precautions provided. No follow-ups on file.   Greig Ring, MD

## 2023-10-17 ENCOUNTER — Ambulatory Visit: Payer: Self-pay | Admitting: Family Medicine

## 2023-10-17 DIAGNOSIS — F409 Phobic anxiety disorder, unspecified: Secondary | ICD-10-CM

## 2023-10-18 MED ORDER — AMOXICILLIN 500 MG PO CAPS: 1000.0000 mg | ORAL_CAPSULE | Freq: Two times a day (BID) | ORAL | 0 refills | Status: DC

## 2023-10-18 MED ORDER — PREDNISONE 20 MG PO TABS
ORAL_TABLET | ORAL | 0 refills | Status: DC
Start: 2023-10-18 — End: 2024-01-23

## 2023-10-22 NOTE — Telephone Encounter (Signed)
 Called pt and made aware that recommendation is to start Cymbalta , this would be safer to try with Amiodarone , according to pharmacist. Pt agreeable, will send to PCP to arrange.  (Forwarding to note to PCP to order/dose Cymbalta .  Dr. Avelina - pt asked me to send to you)

## 2023-10-23 ENCOUNTER — Telehealth: Payer: Self-pay

## 2023-10-23 DIAGNOSIS — M1711 Unilateral primary osteoarthritis, right knee: Secondary | ICD-10-CM | POA: Diagnosis not present

## 2023-10-23 DIAGNOSIS — I480 Paroxysmal atrial fibrillation: Secondary | ICD-10-CM

## 2023-10-23 DIAGNOSIS — M7062 Trochanteric bursitis, left hip: Secondary | ICD-10-CM | POA: Diagnosis not present

## 2023-10-23 NOTE — Telephone Encounter (Signed)
 Left message for patient to call back to schedule an ablation with Dr. Inocencio.

## 2023-10-23 NOTE — Telephone Encounter (Signed)
 Spoke with the patient and scheduled her for an ablation with Dr. Inocencio on 10/10. Patient is aware of CT scan and lab work that is needed prior. Instructions will be send to her through MyChart. Patient will call back with any questions.

## 2023-10-25 MED ORDER — DULOXETINE HCL 30 MG PO CPEP: 30.0000 mg | ORAL_CAPSULE | Freq: Every day | ORAL | 3 refills | Status: DC

## 2023-10-31 MED ORDER — ALPRAZOLAM 0.5 MG PO TABS: 0.5000 mg | ORAL_TABLET | Freq: Every evening | ORAL | 0 refills | Status: DC | PRN

## 2023-10-31 NOTE — Addendum Note (Signed)
 Addended by: WENDELL ARLAND RAMAN on: 10/31/2023 09:51 AM   Modules accepted: Orders

## 2023-10-31 NOTE — Addendum Note (Signed)
 Addended byBETHA AVELINA NO E on: 10/31/2023 02:12 PM   Modules accepted: Orders

## 2023-11-12 DIAGNOSIS — D485 Neoplasm of uncertain behavior of skin: Secondary | ICD-10-CM | POA: Diagnosis not present

## 2023-11-12 DIAGNOSIS — C44212 Basal cell carcinoma of skin of right ear and external auricular canal: Secondary | ICD-10-CM | POA: Diagnosis not present

## 2023-11-13 ENCOUNTER — Telehealth: Payer: Self-pay

## 2023-11-13 DIAGNOSIS — H2513 Age-related nuclear cataract, bilateral: Secondary | ICD-10-CM | POA: Diagnosis not present

## 2023-11-13 DIAGNOSIS — H25043 Posterior subcapsular polar age-related cataract, bilateral: Secondary | ICD-10-CM | POA: Diagnosis not present

## 2023-11-13 DIAGNOSIS — H25013 Cortical age-related cataract, bilateral: Secondary | ICD-10-CM | POA: Diagnosis not present

## 2023-11-13 DIAGNOSIS — H18413 Arcus senilis, bilateral: Secondary | ICD-10-CM | POA: Diagnosis not present

## 2023-11-13 DIAGNOSIS — H2512 Age-related nuclear cataract, left eye: Secondary | ICD-10-CM | POA: Diagnosis not present

## 2023-11-13 NOTE — Telephone Encounter (Signed)
   Patient Name: Savannah Hill  DOB: [birth year] MRN: [id number]  Primary Cardiologist: Will Gladis Norton, MD  Chart reviewed as part of pre-operative protocol coverage. Cataract extractions are recognized in guidelines as low risk surgeries that do not typically require specific preoperative testing or holding of blood thinner therapy. Therefore, given past medical history and time since last visit, based on ACC/AHA guidelines, Savannah Hill would be at acceptable risk for the planned procedure without further cardiovascular testing.   I will route this recommendation to the requesting party via Epic fax function and remove from pre-op pool.  Of note, she has an upcoming Afib ablation and has a hx of CVA when eliquis  was held. She should not interrupt eliquis  for now.   Please call with questions.  Jon Nat Hails, PA 11/13/2023, 4:46 PM

## 2023-11-13 NOTE — Telephone Encounter (Signed)
   Pre-operative Risk Assessment    Patient Name: Savannah Hill  DOB: 1950-06-29 MRN: [id number]   Date of last office visit: 08/26/23 Savannah CAMNITZ, MD Date of next office visit: NONE   Request for Surgical Clearance    Procedure:  CATARACT EXTRACTION W/ INTRAOCULAR LENS IMPLANTATION OF THE LEFT EYE, FOLLOWED BY RIGHT EYE  Date of Surgery:  Clearance 11/26/23    &    12/17/23                            Surgeon:  NOT INDICATED Surgeon's Group or Practice Name:  [facility] Phone number:  [phone] Fax number:  [phone]   Type of Clearance Requested:   - Medical  - Pharmacy:  Hold Apixaban  (Eliquis ) (PER REQUEST: [name])   Type of Anesthesia:  TOPICAL ANESTHESIA W/ IV MEDICATION   Additional requests/questions:    Signed, Savannah Hill   11/13/2023, 4:13 PM

## 2023-11-14 DIAGNOSIS — H18413 Arcus senilis, bilateral: Secondary | ICD-10-CM | POA: Diagnosis not present

## 2023-11-14 DIAGNOSIS — H25043 Posterior subcapsular polar age-related cataract, bilateral: Secondary | ICD-10-CM | POA: Diagnosis not present

## 2023-11-14 DIAGNOSIS — H2512 Age-related nuclear cataract, left eye: Secondary | ICD-10-CM | POA: Diagnosis not present

## 2023-11-14 DIAGNOSIS — H2513 Age-related nuclear cataract, bilateral: Secondary | ICD-10-CM | POA: Diagnosis not present

## 2023-11-14 DIAGNOSIS — H25013 Cortical age-related cataract, bilateral: Secondary | ICD-10-CM | POA: Diagnosis not present

## 2023-11-18 ENCOUNTER — Encounter (HOSPITAL_BASED_OUTPATIENT_CLINIC_OR_DEPARTMENT_OTHER): Payer: Self-pay | Admitting: Obstetrics & Gynecology

## 2023-11-18 ENCOUNTER — Other Ambulatory Visit: Payer: Self-pay | Admitting: Cardiology

## 2023-11-19 ENCOUNTER — Other Ambulatory Visit: Payer: Self-pay | Admitting: Medical Genetics

## 2023-11-19 ENCOUNTER — Other Ambulatory Visit (HOSPITAL_BASED_OUTPATIENT_CLINIC_OR_DEPARTMENT_OTHER): Payer: Self-pay | Admitting: Obstetrics & Gynecology

## 2023-11-19 DIAGNOSIS — Z90710 Acquired absence of both cervix and uterus: Secondary | ICD-10-CM

## 2023-11-19 DIAGNOSIS — I890 Lymphedema, not elsewhere classified: Secondary | ICD-10-CM

## 2023-11-19 DIAGNOSIS — C531 Malignant neoplasm of exocervix: Secondary | ICD-10-CM

## 2023-11-19 NOTE — Therapy (Signed)
 OUTPATIENT PHYSICAL THERAPY  LOWER EXTREMITY ONCOLOGY EVALUATION  Patient Name: Savannah Hill MRN: [id number] DOB:[birth year], 73 y.o., female Today's Date: 11/20/2023  END OF SESSION:  PT End of Session - 11/20/23 0905     Visit Number 1    Number of Visits 24    Date for PT Re-Evaluation 01/15/24    PT Start Time 0905    PT Stop Time 1055    PT Time Calculation (min) 110 min    Activity Tolerance Patient tolerated treatment well    Behavior During Therapy [facility] for tasks assessed/performed          Past Medical History:  Diagnosis Date   Anemia    borderline   Atrial fibrillation (HCC)    a. s/p ablation on 10/05/2016   Cancer (HCC)    cervical/rad hysterectomy/bso wiht chemo for small cell ca   Dyspnea    Heart valve disorder    HLD (hyperlipidemia)    Hypertension    Joint pain    Lower extremity edema    Obesity    Palpitations    Past Surgical History:  Procedure Laterality Date   ABLATION OF DYSRHYTHMIC FOCUS  10/05/2016   ATRIAL FIBRILLATION ABLATION N/A 10/05/2016   Procedure: Atrial Fibrillation Ablation;  Surgeon: Inocencio Soyla Lunger, MD;  Location: [facility] IN[facility]AS[facility];  Service: Cardiovascular;  Laterality: N/A;   IR RADIOLOGY PERIPHERAL GUIDED IV START  09/28/2016   IR US  GUIDE VASC ACCESS RIGHT  09/28/2016   RADICAL ABDOMINAL HYSTERECTOMY  1986   with BSO   REPLACEMENT TOTAL KNEE Right 2022   TEE WITHOUT CARDIOVERSION N/A 06/07/2016   Procedure: TRANSESOPHAGEAL ECHOCARDIOGRAM (TEE);  Surgeon: Jerel Balding, MD;  Location: [facility] ENDOSCOPY;  Service: Cardiovascular;  Laterality: N/A;   Patient Active Problem List   Diagnosis Date Noted   History of CVA (cerebrovascular accident) 05/28/2021   Hyperlipidemia 06/17/2019   Class 1 obesity due to excess calories with serious comorbidity and body mass index (BMI) of 32.0 to 32.9 in adult 12/05/2018   Postmenopausal 12/05/2018   Family history of diabetes mellitus in father 12/05/2018   Impaired fasting  glucose 12/05/2018   Small cell carcinoma (HCC)- of the cervix 12/05/2018   Hip pain, bilateral 05/15/2018   Essential hypertension 01/29/2018   Chronic insomnia 01/29/2018   MDD (major depressive disorder), single episode 01/29/2018   Excessive drinking of alcohol  01/29/2018   Chronic anticoagulation 10/06/2016   Mitral valve disease    PAF (paroxysmal atrial fibrillation) (HCC) 07/01/2013   LAFB (left anterior fascicular block) 07/01/2013   Lymphedema 07/01/2013   h/o Cervical cancer 10/01/2011   H/O total hysterectomy 10/01/2011    PCP:   REFERRING PROVIDER: Ronal Pinal, MD  REFERRING DIAG: Right LE Lymphedema  THERAPY DIAG:  Malignant neoplasm of exocervix (HCC)  Lymphedema, not elsewhere classified  ONSET DATE: April 2025 exacerbation  Rationale for Evaluation and Treatment: Rehabilitation  SUBJECTIVE:  SUBJECTIVE STATEMENT:  I have not been wearing garments but I have been using the pump every night. I am not using the Flexi Touch because It doesn't fit properly since I lost 30 lbs and I don't know how to adjust it. I have been using a basic pump and it does OK and my leg reduces but by the next day swelling returns. She has an Ablation for A Fib October 10. In 2 weeks we go to the [facility]  for 10 days. Pt has old compression stockings, a Flexi touch and another basic style pump, and a Sigvaris night garment.   PERTINENT HISTORY:  radical hysterectomy in 1986 for small cell cervical endocrine cancer with 40 pelvic lymph nodes removed with chemo, no radiation. She developed some swelling after the procedure but has noticed and increase as she has gotten older and with some weight gain. She has a totat knee June 2022 and has had an increase in lymphedema since. She has an extremity pump  from [employer] that she got in June 2022 and a Flexi touch that she received in 2023.[name] She has been treated here from 02/24/2021-06/2021 PAIN:  Are you having pain? No  PRECAUTIONS: Right LE lymphedema, prior CVA,Anemia, cervical CA, HLD, HTN, A-Fib, s/p TKA   RED FLAGS: None   WEIGHT BEARING RESTRICTIONS: No  FALLS:  Has patient fallen in last 6 months? No  LIVING ENVIRONMENT: Lives with: lives with their spouse Lives in: House/apartment Stairs: No;    OCCUPATION: Retired  LEISURE: read, cross word puzzles  PRIOR LEVEL OF FUNCTION: Independent  PATIENT GOALS: Decrease swelling right LE   OBJECTIVE: Note: Objective measures were completed at Evaluation unless otherwise noted.  COGNITION: Overall cognitive status: Within functional limits for tasks assessed   PALPATION: No pitting edema  OBSERVATIONS / OTHER ASSESSMENTS: significant right LE swelling, non pitting, but relatively soft  SENSATION: Light touch: Deficits     POSTURE: Round shoulders, forward head  LYMPHEDEMA ASSESSMENTS:   SURGERY TYPE/DATE: 03/19/1984  NUMBER OF LYMPH NODES REMOVED: 40  CHEMOTHERAPY: yes  RADIATION:  HORMONE TREATMENT: no  INFECTIONS:   LYMPHEDEMA ASSESSMENTS:   LOWER EXTREMITY LANDMARK RIGHT eval  At groin 61.9  30 cm proximal to suprapatella   20 cm proximal to suprapatella 58.2  10 cm proximal to suprapatella 51.4  At midpatella / popliteal crease 38.1  30 cm proximal to floor at lateral plantar foot 41.9  20 cm proximal to floor at lateral plantar foot 39.2  10 cm proximal to floor at lateral plantar foot 32.7  Circumference of ankle/heel   5 cm proximal to 1st MTP joint 21.7  Across MTP joint 21.9  Around proximal great toe 8.0  (Blank rows = not tested)  LOWER EXTREMITY LANDMARK LEFT eval  At groin 59.5  30 cm proximal to suprapatella   20 cm proximal to suprapatella 56.5  10 cm proximal to suprapatella 45.2  At midpatella / popliteal crease 34.9  30 cm  proximal to floor at lateral plantar foot 37.9  20 cm proximal to floor at lateral plantar foot 30.6  10 cm proximal to floor at lateral plantar foot 22.8  Circumference of ankle/heel   5 cm proximal to 1st MTP joint 20.9  Across MTP joint 20.5  Around proximal great toe 7.3  (Blank rows = not tested)  FUNCTIONAL TESTS:    GAIT:WFL   Outcome measure:LLIS: 22%  TREATMENT DATE:  11/20/2023   PATIENT EDUCATION:  Education details: POC, LOS, TREATMENT interventions; Twyla Mort name to call re: adjusting Flexi, Discussed compression stockings maintain, vs bandaging reduces and is very impt part of CDT in addition to MLD or pump. Person educated: Patient and Spouse Education method: Explanation Education comprehension: verbalized understanding  HOME EXERCISE PROGRAM:   ASSESSMENT:  CLINICAL IMPRESSION: Patient is a 73 y.o. female who was seen today for physical therapy evaluation and treatment for progression of her right  LE lymphedema. . She has lymphostatic lymphedema with congestion and hyperkeratosis. She has been treated for Right LE lymphedema most recently 2 years ago and has a Flexi touch that no longer fits properly due to 30 lb wt. Loss.  Her husband was previously independent in compression bandaging, and is willing to help his wife but will need review. We discussed importance of compression to reduce her leg as she questioned why her leg continues to swell despite using a pump..She has compression stockings but has not been wearing them. She will benefit from skilled therapy to address deficits and return pt to a more fxl lifestyle.  OBJECTIVE IMPAIRMENTS: decreased activity tolerance, decreased knowledge of condition, increased edema, and postural dysfunction.   ACTIVITY LIMITATIONS: locomotion level  PARTICIPATION LIMITATIONS: no  restrictions  PERSONAL FACTORS: chronic lymphedema, abdominal surgery for cervical cancer removal with 40 lymph nodes removed, right total knee in June 2022.  are also affecting patient's functional outcome.   REHAB POTENTIAL: Good  CLINICAL DECISION MAKING: Stable/uncomplicated  EVALUATION COMPLEXITY: Low   GOALS: Goals reviewed with patient? Yes  SHORT TERM GOALS: Target date: 12/18/2023  Pt/ husband will be able to do self compression bandaging  Baseline: Goal status: INITIAL  2.  Pts husband will perform MLD or may use compression pump with MLD prn Baseline:  Goal status: INITIAL  3.  Pt will reduce at 10 cm prox to suprapatella by 1.5 cm Baseline:  Goal status: INITIAL  4.  Pt will reduce at 20 cm prox to floor by 2 cm Baseline:  Goal status: INITIAL  5.  Pt will call flexi touch and will have garment refit due to her wt. loss Baseline:  Goal status: INITIAL  LONG TERM GOALS: Target date: 01/15/2024  Pt will have appropriate day  time garments to control lymphedema Baseline:  Goal status: INITIAL  2.  Pt reports she knows how to control her lymphedema at home with lymph drainage manually or with pump, compression and exercise  Baseline:  Goal status: INITIAL  3.  Pt pt will reduce at 10 cm prox to patella by 3 cm to demonstrate improved swelling Baseline:  Goal status: INITIAL  4.  Pt will reduce at 20 cm prox to floor by 4.5 cmt o demonstrate improved swelling Baseline:  Goal status: INITIAL  PLAN:  PT FREQUENCY: 3x/week  PT DURATION: 8 weeks  PLANNED INTERVENTIONS: 97164- PT Re-evaluation, 97110-Therapeutic exercises, 97535- Self Care, 02859- Manual therapy, 97760- Orthotic Initial, [id number]- Orthotic/Prosthetic subsequent, Manual lymph drainage, and Compression bandaging  PLAN FOR NEXT SESSION: Did she call Leah to get Flexi re-fit properly, Instruct pts husband in wrapping as they go out of town in 10 days, MLD, measure weekly   Grayce JINNY Sheldon,  PT 11/20/2023, 10:00 AM

## 2023-11-20 ENCOUNTER — Ambulatory Visit: Attending: Obstetrics & Gynecology

## 2023-11-20 ENCOUNTER — Other Ambulatory Visit: Payer: Self-pay

## 2023-11-20 DIAGNOSIS — Z90710 Acquired absence of both cervix and uterus: Secondary | ICD-10-CM | POA: Insufficient documentation

## 2023-11-20 DIAGNOSIS — I890 Lymphedema, not elsewhere classified: Secondary | ICD-10-CM | POA: Insufficient documentation

## 2023-11-20 DIAGNOSIS — C531 Malignant neoplasm of exocervix: Secondary | ICD-10-CM | POA: Diagnosis present

## 2023-11-20 DIAGNOSIS — C561 Malignant neoplasm of right ovary: Secondary | ICD-10-CM | POA: Diagnosis not present

## 2023-11-22 ENCOUNTER — Other Ambulatory Visit
Admission: RE | Admit: 2023-11-22 | Discharge: 2023-11-22 | Disposition: A | Discharge status: Home / Self Care | Payer: Self-pay | Source: Ambulatory Visit | Attending: Medical Genetics | Admitting: Medical Genetics

## 2023-11-25 ENCOUNTER — Telehealth: Payer: Self-pay

## 2023-11-25 ENCOUNTER — Ambulatory Visit

## 2023-11-25 ENCOUNTER — Other Ambulatory Visit: Payer: Self-pay | Admitting: Family Medicine

## 2023-11-25 DIAGNOSIS — C531 Malignant neoplasm of exocervix: Secondary | ICD-10-CM

## 2023-11-25 DIAGNOSIS — Z90710 Acquired absence of both cervix and uterus: Secondary | ICD-10-CM | POA: Diagnosis not present

## 2023-11-25 DIAGNOSIS — I890 Lymphedema, not elsewhere classified: Secondary | ICD-10-CM

## 2023-11-25 DIAGNOSIS — C561 Malignant neoplasm of right ovary: Secondary | ICD-10-CM | POA: Diagnosis not present

## 2023-11-25 NOTE — Telephone Encounter (Signed)
 See refill encounter, this has been addressed.

## 2023-11-25 NOTE — Telephone Encounter (Signed)
 Called and advised patient she has refills on file with [facility] she will contact the pharmacy.

## 2023-11-25 NOTE — Telephone Encounter (Unsigned)
 Copied from CRM [id number]. Topic: Clinical - Medication Refill >> Nov 25, 2023  1:07 PM [place] J wrote: Medication: DULoxetine  (CYMBALTA ) 30 MG capsule  Has the patient contacted their pharmacy? Yes (Agent: If no, request that the patient contact the pharmacy for the refill. If patient does not wish to contact the pharmacy document the reason why and proceed with request.) (Agent: If yes, when and what did the pharmacy advise?)  This is the patient's preferred pharmacy:  [facility] - [place], [place] - [address] [facility] [address] [place] [address] Phone: [phone] Fax: [phone]  Is this the correct pharmacy for this prescription? Yes If no, delete pharmacy and type the correct one.   Has the prescription been filled recently? No  Is the patient out of the medication? Yes  Has the patient been seen for an appointment in the last year OR does the patient have an upcoming appointment? Yes  Can we respond through MyChart? Yes  Agent: Please be advised that Rx refills may take up to 3 business days. We ask that you follow-up with your pharmacy.

## 2023-11-25 NOTE — Therapy (Signed)
 OUTPATIENT PHYSICAL THERAPY  LOWER EXTREMITY ONCOLOGY TREATMENT  Patient Name: Savannah Hill MRN: [id number] DOB:Nov 09, 1950, 73 y.o., female Today's Date: 11/25/2023  END OF SESSION:  PT End of Session - 11/25/23 0957     Visit Number 2    Number of Visits 24    Date for PT Re-Evaluation 01/15/24    PT Start Time 1000    PT Stop Time 1056    PT Time Calculation (min) 56 min    Activity Tolerance Patient tolerated treatment well    Behavior During Therapy [facility] for tasks assessed/performed          Past Medical History:  Diagnosis Date   Anemia    borderline   Atrial fibrillation (HCC)    a. s/p ablation on 10/05/2016   Cancer (HCC)    cervical/rad hysterectomy/bso wiht chemo for small cell ca   Dyspnea    Heart valve disorder    HLD (hyperlipidemia)    Hypertension    Joint pain    Lower extremity edema    Obesity    Palpitations    Past Surgical History:  Procedure Laterality Date   ABLATION OF DYSRHYTHMIC FOCUS  10/05/2016   ATRIAL FIBRILLATION ABLATION N/A 10/05/2016   Procedure: Atrial Fibrillation Ablation;  Surgeon: Inocencio Soyla Lunger, MD;  Location: [facility] INVASIVE CV LAB;  Service: Cardiovascular;  Laterality: N/A;   IR RADIOLOGY PERIPHERAL GUIDED IV START  09/28/2016   IR US  GUIDE VASC ACCESS RIGHT  09/28/2016   RADICAL ABDOMINAL HYSTERECTOMY  1986   with BSO   REPLACEMENT TOTAL KNEE Right 2022   TEE WITHOUT CARDIOVERSION N/A 06/07/2016   Procedure: TRANSESOPHAGEAL ECHOCARDIOGRAM (TEE);  Surgeon: Jerel Balding, MD;  Location: [facility] ENDOSCOPY;  Service: Cardiovascular;  Laterality: N/A;   Patient Active Problem List   Diagnosis Date Noted   History of CVA (cerebrovascular accident) 05/28/2021   Hyperlipidemia 06/17/2019   Class 1 obesity due to excess calories with serious comorbidity and body mass index (BMI) of 32.0 to 32.9 in adult 12/05/2018   Postmenopausal 12/05/2018   Family history of diabetes mellitus in father 12/05/2018   Impaired fasting  glucose 12/05/2018   Small cell carcinoma (HCC)- of the cervix 12/05/2018   Hip pain, bilateral 05/15/2018   Essential hypertension 01/29/2018   Chronic insomnia 01/29/2018   MDD (major depressive disorder), single episode 01/29/2018   Excessive drinking of alcohol  01/29/2018   Chronic anticoagulation 10/06/2016   Mitral valve disease    PAF (paroxysmal atrial fibrillation) (HCC) 07/01/2013   LAFB (left anterior fascicular block) 07/01/2013   Lymphedema 07/01/2013   h/o Cervical cancer 10/01/2011   H/O total hysterectomy 10/01/2011    PCP:   REFERRING PROVIDER: Ronal Pinal, MD  REFERRING DIAG: Right LE Lymphedema  THERAPY DIAG:  Malignant neoplasm of exocervix (HCC)  Lymphedema, not elsewhere classified  S/P total hysterectomy  ONSET DATE: April 2025 exacerbation  Rationale for Evaluation and Treatment: Rehabilitation  SUBJECTIVE:  SUBJECTIVE STATEMENT:  I have been wearing the flat knit stocking and my leg has improved some.  Can we try wrapping without the toes to see how it goes?    EVAL I have not been wearing garments but I have been using the pump every night. I am not using the Flexi Touch because It doesn't fit properly since I lost 30 lbs and I don't know how to adjust it. I have been using a basic pump and it does OK and my leg reduces but by the next day swelling returns. She has an Ablation for A Fib October 10. In 2 weeks we go to the [facility]  for 10 days. Pt has old compression stockings, a Flexi touch and another basic style pump, and a Sigvaris night garment.   PERTINENT HISTORY:  radical hysterectomy in 1986 for small cell cervical endocrine cancer with 40 pelvic lymph nodes removed with chemo, no radiation. She developed some swelling after the procedure but has noticed  and increase as she has gotten older and with some weight gain. She has a totat knee June 2022 and has had an increase in lymphedema since. She has an extremity pump from [employer] that she got in June 2022 and a Flexi touch that she received in 2023.[name] She has been treated here from 02/24/2021-06/2021 PAIN:  Are you having pain? No  PRECAUTIONS: Right LE lymphedema, prior CVA,Anemia, cervical CA, HLD, HTN, A-Fib, s/p TKA   RED FLAGS: None   WEIGHT BEARING RESTRICTIONS: No  FALLS:  Has patient fallen in last 6 months? No  LIVING ENVIRONMENT: Lives with: lives with their spouse Lives in: House/apartment Stairs: No;    OCCUPATION: Retired  LEISURE: read, cross word puzzles  PRIOR LEVEL OF FUNCTION: Independent  PATIENT GOALS: Decrease swelling right LE   OBJECTIVE: Note: Objective measures were completed at Evaluation unless otherwise noted.  COGNITION: Overall cognitive status: Within functional limits for tasks assessed   PALPATION: No pitting edema  OBSERVATIONS / OTHER ASSESSMENTS: significant right LE swelling, non pitting, but relatively soft  SENSATION: Light touch: Deficits     POSTURE: Round shoulders, forward head  LYMPHEDEMA ASSESSMENTS:   SURGERY TYPE/DATE: 03/19/1984  NUMBER OF LYMPH NODES REMOVED: 40  CHEMOTHERAPY: yes  RADIATION:  HORMONE TREATMENT: no  INFECTIONS:   LYMPHEDEMA ASSESSMENTS:   LOWER EXTREMITY LANDMARK RIGHT eval RIGHT 11/25/2023  At groin 61.9   30 cm proximal to suprapatella    20 cm proximal to suprapatella 58.2   10 cm proximal to suprapatella 51.4   At midpatella / popliteal crease 38.1   30 cm proximal to floor at lateral plantar foot 41.9 40.5  20 cm proximal to floor at lateral plantar foot 39.2 39  10 cm proximal to floor at lateral plantar foot 32.7 31.4  Circumference of ankle/heel    5 cm proximal to 1st MTP joint 21.7   Across MTP joint 21.9   Around proximal great toe 8.0   (Blank rows = not tested)  LOWER  EXTREMITY LANDMARK LEFT eval  At groin 59.5  30 cm proximal to suprapatella   20 cm proximal to suprapatella 56.5  10 cm proximal to suprapatella 45.2  At midpatella / popliteal crease 34.9  30 cm proximal to floor at lateral plantar foot 37.9  20 cm proximal to floor at lateral plantar foot 30.6  10 cm proximal to floor at lateral plantar foot 22.8  Circumference of ankle/heel   5 cm proximal to 1st MTP joint 20.9  Across MTP joint 20.5  Around proximal great toe 7.3  (Blank rows = not tested)  FUNCTIONAL TESTS:    GAIT:WFL   Outcome measure:LLIS: 22%                                                                                                                            TREATMENT DATE:   11/25/2023 Measured lower leg with some reduction since wearing stocking Pts husband instructed in compression bandaging . Instructed first by therapist and then pts husband demonstrated. Therapist applied Medium TG soft foot to thigh, Small amt of artiflex to foot and ankle, then Comprilan soft 10 cm ankle to knee, and 15 cm  comprilan knee to thigh. 6 cm to foot with figure 8's, then 8 cm with heel locks and circles up leg. 12 cm mid leg to knee, then 12 cm below knee to mid thigh, and additional 12 cm knee to groin. Pts husband videotaped therapist wrapping, and also performed 6 cm and 8 cm wrap 2 x's each and observed therapist do the remainder due to time. Checked capillary refill and was good. Pt put on her velcro sandals. Left out toe wraps at pt requuest.  11/20/2023   PATIENT EDUCATION:  Education details: POC, LOS, TREATMENT interventions; Twyla Mort name to call re: adjusting Flexi, Discussed compression stockings maintain, vs bandaging reduces and is very impt part of CDT in addition to MLD or pump. Person educated: Patient and Spouse Education method: Explanation Education comprehension: verbalized understanding  HOME EXERCISE PROGRAM:   ASSESSMENT:  CLINICAL IMPRESSION: P  had some reduction in lower leg swelling just wearing her flat knit stocking. Pts husband did well with first 2 wraps practicing each x 2. Pt did not want her toes wrapped unless they got very swollen.  EVAL Patient is a 73 y.o. female who was seen today for physical therapy evaluation and treatment for progression of her right  LE lymphedema. . She has lymphostatic lymphedema with congestion and hyperkeratosis. She has been treated for Right LE lymphedema most recently 2 years ago and has a Flexi touch that no longer fits properly due to 30 lb wt. Loss.  Her husband was previously independent in compression bandaging, and is willing to help his wife but will need review. We discussed importance of compression to reduce her leg as she questioned why her leg continues to swell despite using a pump..She has compression stockings but has not been wearing them. She will benefit from skilled therapy to address deficits and return pt to a more fxl lifestyle.  OBJECTIVE IMPAIRMENTS: decreased activity tolerance, decreased knowledge of condition, increased edema, and postural dysfunction.   ACTIVITY LIMITATIONS: locomotion level  PARTICIPATION LIMITATIONS: no restrictions  PERSONAL FACTORS: chronic lymphedema, abdominal surgery for cervical cancer removal with 40 lymph nodes removed, right total knee in June 2022.  are also affecting patient's functional outcome.   REHAB POTENTIAL: Good  CLINICAL DECISION MAKING: Stable/uncomplicated  EVALUATION COMPLEXITY: Low  GOALS: Goals reviewed with patient? Yes  SHORT TERM GOALS: Target date: 12/18/2023  Pt/ husband will be able to do self compression bandaging  Baseline: Goal status: INITIAL  2.  Pts husband will perform MLD or may use compression pump with MLD prn Baseline:  Goal status: INITIAL  3.  Pt will reduce at 10 cm prox to suprapatella by 1.5 cm Baseline:  Goal status: INITIAL  4.  Pt will reduce at 20 cm prox to floor by 2  cm Baseline:  Goal status: INITIAL  5.  Pt will call flexi touch and will have garment refit due to her wt. loss Baseline:  Goal status: INITIAL  LONG TERM GOALS: Target date: 01/15/2024  Pt will have appropriate day  time garments to control lymphedema Baseline:  Goal status: INITIAL  2.  Pt reports she knows how to control her lymphedema at home with lymph drainage manually or with pump, compression and exercise  Baseline:  Goal status: INITIAL  3.  Pt pt will reduce at 10 cm prox to patella by 3 cm to demonstrate improved swelling Baseline:  Goal status: INITIAL  4.  Pt will reduce at 20 cm prox to floor by 4.5 cmt o demonstrate improved swelling Baseline:  Goal status: INITIAL  PLAN:  PT FREQUENCY: 3x/week  PT DURATION: 8 weeks  PLANNED INTERVENTIONS: 97164- PT Re-evaluation, 97110-Therapeutic exercises, 97535- Self Care, 02859- Manual therapy, 97760- Orthotic Initial, [id number]- Orthotic/Prosthetic subsequent, Manual lymph drainage, and Compression bandaging  PLAN FOR NEXT SESSION: Did she call [facility] to get Flexi re-fit properly, Instruct pts husband in wrapping as they go out of town in 10 days, MLD, measure weekly   Grayce JINNY Sheldon, PT 11/25/2023, 10:57 AM

## 2023-11-25 NOTE — Telephone Encounter (Signed)
 Copied from CRM [id number]. Topic: Clinical - Medication Question >> Nov 25, 2023  1:10 PM [place] J wrote: Reason for CRM: Patient was wondering if 4-5 capsules of her DULoxetine  (CYMBALTA ) 30 MG capsule could be sent to [facility] until her approval for her full refill comes through. She is [facility] and wants to make sure she has something to get her through until the medication delivery arrives.

## 2023-11-26 DIAGNOSIS — H25011 Cortical age-related cataract, right eye: Secondary | ICD-10-CM | POA: Diagnosis not present

## 2023-11-26 DIAGNOSIS — H2512 Age-related nuclear cataract, left eye: Secondary | ICD-10-CM | POA: Diagnosis not present

## 2023-11-26 DIAGNOSIS — H25041 Posterior subcapsular polar age-related cataract, right eye: Secondary | ICD-10-CM | POA: Diagnosis not present

## 2023-11-26 DIAGNOSIS — Z961 Presence of intraocular lens: Secondary | ICD-10-CM | POA: Diagnosis not present

## 2023-11-26 DIAGNOSIS — H2511 Age-related nuclear cataract, right eye: Secondary | ICD-10-CM | POA: Diagnosis not present

## 2023-11-26 DIAGNOSIS — H268 Other specified cataract: Secondary | ICD-10-CM | POA: Diagnosis not present

## 2023-11-27 ENCOUNTER — Ambulatory Visit

## 2023-11-27 DIAGNOSIS — Z90710 Acquired absence of both cervix and uterus: Secondary | ICD-10-CM | POA: Diagnosis not present

## 2023-11-27 DIAGNOSIS — C531 Malignant neoplasm of exocervix: Secondary | ICD-10-CM

## 2023-11-27 DIAGNOSIS — I890 Lymphedema, not elsewhere classified: Secondary | ICD-10-CM

## 2023-11-27 DIAGNOSIS — C561 Malignant neoplasm of right ovary: Secondary | ICD-10-CM | POA: Diagnosis not present

## 2023-11-27 NOTE — Therapy (Signed)
 OUTPATIENT PHYSICAL THERAPY  LOWER EXTREMITY ONCOLOGY TREATMENT  Patient Name: Savannah Hill MRN: [id number] DOB:[birth year], 73 y.o., female Today's Date: 11/27/2023  END OF SESSION:  PT End of Session - 11/27/23 1108     Visit Number 3    Number of Visits 24    Date for PT Re-Evaluation 01/15/24    PT Start Time 1106    PT Stop Time 1205    PT Time Calculation (min) 59 min    Activity Tolerance Patient tolerated treatment well    Behavior During Therapy [facility] for tasks assessed/performed          Past Medical History:  Diagnosis Date   Anemia    borderline   Atrial fibrillation (HCC)    a. s/p ablation on 10/05/2016   Cancer (HCC)    cervical/rad hysterectomy/bso wiht chemo for small cell ca   Dyspnea    Heart valve disorder    HLD (hyperlipidemia)    Hypertension    Joint pain    Lower extremity edema    Obesity    Palpitations    Past Surgical History:  Procedure Laterality Date   ABLATION OF DYSRHYTHMIC FOCUS  10/05/2016   ATRIAL FIBRILLATION ABLATION N/A 10/05/2016   Procedure: Atrial Fibrillation Ablation;  Surgeon: Inocencio Soyla Lunger, MD;  Location: [facility] INVASIVE CV LAB;  Service: Cardiovascular;  Laterality: N/A;   IR RADIOLOGY PERIPHERAL GUIDED IV START  09/28/2016   IR US  GUIDE VASC ACCESS RIGHT  09/28/2016   RADICAL ABDOMINAL HYSTERECTOMY  1986   with BSO   REPLACEMENT TOTAL KNEE Right 2022   TEE WITHOUT CARDIOVERSION N/A 06/07/2016   Procedure: TRANSESOPHAGEAL ECHOCARDIOGRAM (TEE);  Surgeon: Jerel Balding, MD;  Location: [facility] ENDOSCOPY;  Service: Cardiovascular;  Laterality: N/A;   Patient Active Problem List   Diagnosis Date Noted   History of CVA (cerebrovascular accident) 05/28/2021   Hyperlipidemia 06/17/2019   Class 1 obesity due to excess calories with serious comorbidity and body mass index (BMI) of 32.0 to 32.9 in adult 12/05/2018   Postmenopausal 12/05/2018   Family history of diabetes mellitus in father 12/05/2018   Impaired fasting  glucose 12/05/2018   Small cell carcinoma (HCC)- of the cervix 12/05/2018   Hip pain, bilateral 05/15/2018   Essential hypertension 01/29/2018   Chronic insomnia 01/29/2018   MDD (major depressive disorder), single episode 01/29/2018   Excessive drinking of alcohol  01/29/2018   Chronic anticoagulation 10/06/2016   Mitral valve disease    PAF (paroxysmal atrial fibrillation) (HCC) 07/01/2013   LAFB (left anterior fascicular block) 07/01/2013   Lymphedema 07/01/2013   h/o Cervical cancer 10/01/2011   H/O total hysterectomy 10/01/2011    PCP:   REFERRING PROVIDER: Ronal Pinal, MD  REFERRING DIAG: Right LE Lymphedema  THERAPY DIAG:  Malignant neoplasm of exocervix (HCC)  Lymphedema, not elsewhere classified  S/P total hysterectomy  ONSET DATE: April 2025 exacerbation  Rationale for Evaluation and Treatment: Rehabilitation  SUBJECTIVE:  SUBJECTIVE STATEMENT:  I left the bandages on until this morning and my leg looked so much smaller! Then I've been wearing the flat knit since I showered so I'd have something to wear until I got here.    EVAL I have not been wearing garments but I have been using the pump every night. I am not using the Flexi Touch because It doesn't fit properly since I lost 30 lbs and I don't know how to adjust it. I have been using a basic pump and it does OK and my leg reduces but by the next day swelling returns. She has an Ablation for A Fib October 10. In 2 weeks we go to the [facility]  for 10 days. Pt has old compression stockings, a Flexi touch and another basic style pump, and a Sigvaris night garment.   PERTINENT HISTORY:  radical hysterectomy in 1986 for small cell cervical endocrine cancer with 40 pelvic lymph nodes removed with chemo, no radiation. She developed  some swelling after the procedure but has noticed and increase as she has gotten older and with some weight gain. She has a totat knee June 2022 and has had an increase in lymphedema since. She has an extremity pump from [employer] that she got in June 2022 and a Flexi touch that she received in 2023.Savannah Hill She has been treated here from 02/24/2021-06/2021 PAIN:  Are you having pain? No  PRECAUTIONS: Right LE lymphedema, prior CVA,Anemia, cervical CA, HLD, HTN, A-Fib, s/p TKA   RED FLAGS: None   WEIGHT BEARING RESTRICTIONS: No  FALLS:  Has patient fallen in last 6 months? No  LIVING ENVIRONMENT: Lives with: lives with their spouse Lives in: House/apartment Stairs: No;    OCCUPATION: Retired  LEISURE: read, cross word puzzles  PRIOR LEVEL OF FUNCTION: Independent  PATIENT GOALS: Decrease swelling right LE   OBJECTIVE: Note: Objective measures were completed at Evaluation unless otherwise noted.  COGNITION: Overall cognitive status: Within functional limits for tasks assessed   PALPATION: No pitting edema  OBSERVATIONS / OTHER ASSESSMENTS: significant right LE swelling, non pitting, but relatively soft  SENSATION: Light touch: Deficits     POSTURE: Round shoulders, forward head  LYMPHEDEMA ASSESSMENTS:   SURGERY TYPE/DATE: 03/19/1984  NUMBER OF LYMPH NODES REMOVED: 40  CHEMOTHERAPY: yes  RADIATION:  HORMONE TREATMENT: no  INFECTIONS:   LYMPHEDEMA ASSESSMENTS:   LOWER EXTREMITY LANDMARK RIGHT eval RIGHT 11/25/2023  At groin 61.9   30 cm proximal to suprapatella    20 cm proximal to suprapatella 58.2   10 cm proximal to suprapatella 51.4   At midpatella / popliteal crease 38.1   30 cm proximal to floor at lateral plantar foot 41.9 40.5  20 cm proximal to floor at lateral plantar foot 39.2 39  10 cm proximal to floor at lateral plantar foot 32.7 31.4  Circumference of ankle/heel    5 cm proximal to 1st MTP joint 21.7   Across MTP joint 21.9   Around proximal  great toe 8.0   (Blank rows = not tested)  LOWER EXTREMITY LANDMARK LEFT eval  At groin 59.5  30 cm proximal to suprapatella   20 cm proximal to suprapatella 56.5  10 cm proximal to suprapatella 45.2  At midpatella / popliteal crease 34.9  30 cm proximal to floor at lateral plantar foot 37.9  20 cm proximal to floor at lateral plantar foot 30.6  10 cm proximal to floor at lateral plantar foot 22.8  Circumference of ankle/heel  5 cm proximal to 1st MTP joint 20.9  Across MTP joint 20.5  Around proximal great toe 7.3  (Blank rows = not tested)  FUNCTIONAL TESTS:    GAIT:WFL   Outcome measure:LLIS: 22%                                                                                                                            TREATMENT DATE:  11/27/23: Manual Therapy MLD to Rt LE: Short neck, superficial and deep abdominals, Rt axillary nodes, Rt ingiuno-axillary anastomosis, then Rt LE as follows: lateral thigh, medial to latera, 4 techniques at knee, ant and post lower leg, ankle, retro malleoli, dorsal foot/toes then retraced all steps. While performing instructed pt in importance of her continuing self MLD at home. But when bandages on only needs to focus on pretreat, axillary nodes and anastomosis as done today. Reviewed correct technique and directionality.  Compression Bandaging to Rt LE as follows: Cocoa butter, Medium TG soft foot to thigh, Small amt of artiflex to foot and ankle and then at pop fossa, then Comprilan soft 10 cm ankle to knee, and 15 cm  comprilan knee to thigh.  6 cm to foot Roman sandal, then 8 cm ASH pattern. 12 cm ankle to knee, then 12 cm below knee to mid thigh, and additional 12 cm knee to groin, then last 12 cm mid shin to groin. Pts husband did the 6 cm and cuing offered for reinforcement throughout, mostly not to pull bandage but instead offer constant tension throughout. Checked capillary refill and was good. Assisted pt with donning on her velcro  sandals. Self Care Instructed pts husband when he was applying 6 cm bandage, see above. Then at end of session explained difference between Active phase of CDT treatment, which we are doing now, then we work to transition to the Maintenance Phase with her new flat knit garment and then they don't have to cont bandaging IF she remains compliant and consistent with daily wear of stocking.   11/25/2023 Measured lower leg with some reduction since wearing stocking Pts husband instructed in compression bandaging . Instructed first by therapist and then pts husband demonstrated. Therapist applied Medium TG soft foot to thigh, Small amt of artiflex to foot and ankle, then Comprilan soft 10 cm ankle to knee, and 15 cm  comprilan knee to thigh. 6 cm to foot with figure 8's, then 8 cm with heel locks and circles up leg. 12 cm mid leg to knee, then 12 cm below knee to mid thigh, and additional 12 cm knee to groin. Pts husband videotaped therapist wrapping, and also performed 6 cm and 8 cm wrap 2 x's each and observed therapist do the remainder due to time. Checked capillary refill and was good. Pt put on her velcro sandals. Left out toe wraps at pt requuest.  11/20/2023   PATIENT EDUCATION:  Education details: POC, LOS, TREATMENT interventions; Twyla Mort name to call re: adjusting Flexi, Discussed compression  stockings maintain, vs bandaging reduces and is very impt part of CDT in addition to MLD or pump. Person educated: Patient and Spouse Education method: Explanation Education comprehension: verbalized understanding  HOME EXERCISE PROGRAM:   ASSESSMENT:  CLINICAL IMPRESSION: Pt reports good reductions in lower leg since removing bandages this morning and was pleasantly surprised. Continued with CDT of Rt LE and reviewed 6 cm bandage with pts husband who did fairly well. He required cuing for lining bandages up neatly and for constant compression, not pulling bandage hard as this will make it too tight.  Pt reports her bandage comfortable once completed. They will be out of town end of the month so discussed trying to have her wrapped 10/1 or 10/7 appt as these are her first appts back. Also discussed importance if being in bandages as much as possible while gone to maximize reductions.   EVAL Patient is a 73 y.o. female who was seen today for physical therapy evaluation and treatment for progression of her right  LE lymphedema. . She has lymphostatic lymphedema with congestion and hyperkeratosis. She has been treated for Right LE lymphedema most recently 2 years ago and has a Flexi touch that no longer fits properly due to 30 lb wt. Loss.  Her husband was previously independent in compression bandaging, and is willing to help his wife but will need review. We discussed importance of compression to reduce her leg as she questioned why her leg continues to swell despite using a pump..She has compression stockings but has not been wearing them. She will benefit from skilled therapy to address deficits and return pt to a more fxl lifestyle.  OBJECTIVE IMPAIRMENTS: decreased activity tolerance, decreased knowledge of condition, increased edema, and postural dysfunction.   ACTIVITY LIMITATIONS: locomotion level  PARTICIPATION LIMITATIONS: no restrictions  PERSONAL FACTORS: chronic lymphedema, abdominal surgery for cervical cancer removal with 40 lymph nodes removed, right total knee in June 2022.  are also affecting patient's functional outcome.   REHAB POTENTIAL: Good  CLINICAL DECISION MAKING: Stable/uncomplicated  EVALUATION COMPLEXITY: Low   GOALS: Goals reviewed with patient? Yes  SHORT TERM GOALS: Target date: 12/18/2023  Pt/ husband will be able to do self compression bandaging  Baseline: Goal status: INITIAL  2.  Pts husband will perform MLD or may use compression pump with MLD prn Baseline:  Goal status: INITIAL  3.  Pt will reduce at 10 cm prox to suprapatella by 1.5  cm Baseline:  Goal status: INITIAL  4.  Pt will reduce at 20 cm prox to floor by 2 cm Baseline:  Goal status: INITIAL  5.  Pt will call flexi touch and will have garment refit due to her wt. loss Baseline:  Goal status: INITIAL  LONG TERM GOALS: Target date: 01/15/2024  Pt will have appropriate day  time garments to control lymphedema Baseline:  Goal status: INITIAL  2.  Pt reports she knows how to control her lymphedema at home with lymph drainage manually or with pump, compression and exercise  Baseline:  Goal status: INITIAL  3.  Pt pt will reduce at 10 cm prox to patella by 3 cm to demonstrate improved swelling Baseline:  Goal status: INITIAL  4.  Pt will reduce at 20 cm prox to floor by 4.5 cmt o demonstrate improved swelling Baseline:  Goal status: INITIAL  PLAN:  PT FREQUENCY: 3x/week  PT DURATION: 8 weeks  PLANNED INTERVENTIONS: [id number]- PT Re-evaluation, 97110-Therapeutic exercises, [id number]- Self Care, [id number]- Manual therapy, [id number]- Orthotic Initial, [id number]- Orthotic/Prosthetic  subsequent, Manual lymph drainage, and Compression bandaging  PLAN FOR NEXT SESSION: Cont to instruct pts husband in wrapping as they go out of town in 10 days, MLD, measure weekly   Aden Berwyn Caldron, PTA 11/27/2023, 1:20 PM

## 2023-11-29 ENCOUNTER — Telehealth (HOSPITAL_COMMUNITY): Payer: Self-pay

## 2023-11-29 ENCOUNTER — Ambulatory Visit

## 2023-11-29 DIAGNOSIS — C561 Malignant neoplasm of right ovary: Secondary | ICD-10-CM | POA: Diagnosis not present

## 2023-11-29 DIAGNOSIS — Z90710 Acquired absence of both cervix and uterus: Secondary | ICD-10-CM

## 2023-11-29 DIAGNOSIS — C531 Malignant neoplasm of exocervix: Secondary | ICD-10-CM

## 2023-11-29 DIAGNOSIS — I890 Lymphedema, not elsewhere classified: Secondary | ICD-10-CM | POA: Diagnosis not present

## 2023-11-29 NOTE — Telephone Encounter (Signed)
 Attempted to reach patient to discuss upcoming procedure, no answer. Left VM for patient to return call.

## 2023-11-29 NOTE — Therapy (Signed)
 OUTPATIENT PHYSICAL THERAPY  LOWER EXTREMITY ONCOLOGY TREATMENT  Patient Name: Savannah Hill MRN: [id number] DOB:06/27/1950, 73 y.o., female Today's Date: 11/29/2023  END OF SESSION:  PT End of Session - 11/29/23 0954     Visit Number 4    Number of Visits 24    Date for PT Re-Evaluation 01/15/24    PT Start Time 0956    PT Stop Time 1100    PT Time Calculation (min) 64 min    Activity Tolerance Patient tolerated treatment well    Behavior During Therapy [facility] for tasks assessed/performed          Past Medical History:  Diagnosis Date   Anemia    borderline   Atrial fibrillation (HCC)    a. s/p ablation on 10/05/2016   Cancer (HCC)    cervical/rad hysterectomy/bso wiht chemo for small cell ca   Dyspnea    Heart valve disorder    HLD (hyperlipidemia)    Hypertension    Joint pain    Lower extremity edema    Obesity    Palpitations    Past Surgical History:  Procedure Laterality Date   ABLATION OF DYSRHYTHMIC FOCUS  10/05/2016   ATRIAL FIBRILLATION ABLATION N/A 10/05/2016   Procedure: Atrial Fibrillation Ablation;  Surgeon: Inocencio Soyla Lunger, MD;  Location: [facility] INVASIVE CV LAB;  Service: Cardiovascular;  Laterality: N/A;   IR RADIOLOGY PERIPHERAL GUIDED IV START  09/28/2016   IR US  GUIDE VASC ACCESS RIGHT  09/28/2016   RADICAL ABDOMINAL HYSTERECTOMY  1986   with BSO   REPLACEMENT TOTAL KNEE Right 2022   TEE WITHOUT CARDIOVERSION N/A 06/07/2016   Procedure: TRANSESOPHAGEAL ECHOCARDIOGRAM (TEE);  Surgeon: Jerel Balding, MD;  Location: [facility] ENDOSCOPY;  Service: Cardiovascular;  Laterality: N/A;   Patient Active Problem List   Diagnosis Date Noted   History of CVA (cerebrovascular accident) 05/28/2021   Hyperlipidemia 06/17/2019   Class 1 obesity due to excess calories with serious comorbidity and body mass index (BMI) of 32.0 to 32.9 in adult 12/05/2018   Postmenopausal 12/05/2018   Family history of diabetes mellitus in father 12/05/2018   Impaired fasting  glucose 12/05/2018   Small cell carcinoma (HCC)- of the cervix 12/05/2018   Hip pain, bilateral 05/15/2018   Essential hypertension 01/29/2018   Chronic insomnia 01/29/2018   MDD (major depressive disorder), single episode 01/29/2018   Excessive drinking of alcohol  01/29/2018   Chronic anticoagulation 10/06/2016   Mitral valve disease    PAF (paroxysmal atrial fibrillation) (HCC) 07/01/2013   LAFB (left anterior fascicular block) 07/01/2013   Lymphedema 07/01/2013   h/o Cervical cancer 10/01/2011   H/O total hysterectomy 10/01/2011    PCP:   REFERRING PROVIDER: Ronal Pinal, MD  REFERRING DIAG: Right LE Lymphedema  THERAPY DIAG:  Malignant neoplasm of exocervix (HCC)  Lymphedema, not elsewhere classified  S/P total hysterectomy  ONSET DATE: April 2025 exacerbation  Rationale for Evaluation and Treatment: Rehabilitation  SUBJECTIVE:  SUBJECTIVE STATEMENT:  We had a bit of a mishap. At 8:00 it felt too tight so Rick rewrapped it. My toes got really swollen. I ripped it off in the middle of the night, and since I took the foot part off the top of the foot and my toes swelled.   EVAL I have not been wearing garments but I have been using the pump every night. I am not using the Flexi Touch because It doesn't fit properly since I lost 30 lbs and I don't know how to adjust it. I have been using a basic pump and it does OK and my leg reduces but by the next day swelling returns. She has an Ablation for A Fib October 10. In 2 weeks we go to the [facility]  for 10 days. Pt has old compression stockings, a Flexi touch and another basic style pump, and a Sigvaris night garment.   PERTINENT HISTORY:  radical hysterectomy in 1986 for small cell cervical endocrine cancer with 40 pelvic lymph nodes removed  with chemo, no radiation. She developed some swelling after the procedure but has noticed and increase as she has gotten older and with some weight gain. She has a totat knee June 2022 and has had an increase in lymphedema since. She has an extremity pump from [employer] that she got in June 2022 and a Flexi touch that she received in 2023.[name] She has been treated here from 02/24/2021-06/2021 PAIN:  Are you having pain? No  PRECAUTIONS: Right LE lymphedema, prior CVA,Anemia, cervical CA, HLD, HTN, A-Fib, s/p TKA   RED FLAGS: None   WEIGHT BEARING RESTRICTIONS: No  FALLS:  Has patient fallen in last 6 months? No  LIVING ENVIRONMENT: Lives with: lives with their spouse Lives in: House/apartment Stairs: No;    OCCUPATION: Retired  LEISURE: read, cross word puzzles  PRIOR LEVEL OF FUNCTION: Independent  PATIENT GOALS: Decrease swelling right LE   OBJECTIVE: Note: Objective measures were completed at Evaluation unless otherwise noted.  COGNITION: Overall cognitive status: Within functional limits for tasks assessed   PALPATION: No pitting edema  OBSERVATIONS / OTHER ASSESSMENTS: significant right LE swelling, non pitting, but relatively soft  SENSATION: Light touch: Deficits     POSTURE: Round shoulders, forward head  LYMPHEDEMA ASSESSMENTS:   SURGERY TYPE/DATE: 03/19/1984  NUMBER OF LYMPH NODES REMOVED: 40  CHEMOTHERAPY: yes  RADIATION:  HORMONE TREATMENT: no  INFECTIONS:   LYMPHEDEMA ASSESSMENTS:   LOWER EXTREMITY LANDMARK RIGHT eval RIGHT 11/25/2023  At groin 61.9   30 cm proximal to suprapatella    20 cm proximal to suprapatella 58.2   10 cm proximal to suprapatella 51.4   At midpatella / popliteal crease 38.1   30 cm proximal to floor at lateral plantar foot 41.9 40.5  20 cm proximal to floor at lateral plantar foot 39.2 39  10 cm proximal to floor at lateral plantar foot 32.7 31.4  Circumference of ankle/heel    5 cm proximal to 1st MTP joint 21.7    Across MTP joint 21.9   Around proximal great toe 8.0   (Blank rows = not tested)  LOWER EXTREMITY LANDMARK LEFT eval  At groin 59.5  30 cm proximal to suprapatella   20 cm proximal to suprapatella 56.5  10 cm proximal to suprapatella 45.2  At midpatella / popliteal crease 34.9  30 cm proximal to floor at lateral plantar foot 37.9  20 cm proximal to floor at lateral plantar foot 30.6  10 cm proximal  to floor at lateral plantar foot 22.8  Circumference of ankle/heel   5 cm proximal to 1st MTP joint 20.9  Across MTP joint 20.5  Around proximal great toe 7.3  (Blank rows = not tested)  FUNCTIONAL TESTS:    GAIT:WFL   Outcome measure:LLIS: 22%                                                                                                                            TREATMENT DATE:  11/29/2023 Manual Therapy MLD to Rt LE: Short neck, superficial and deep abdominals, Rt axillary nodes, Rt ingiuno-axillary anastomosis, then Rt LE as follows: lateral thigh, medial to latera, 4 techniques at knee, ant and post lower leg, ankle, retro malleoli, dorsal foot/toes then retraced all steps.   Compression Bandaging to Rt LE as follows: Cocoa butter, Medium TG soft foot to thigh, Small amt of artiflex to foot and ankle and then at pop fossa, then Comprilan soft 10 cm ankle to knee, and 15 cm  comprilan knee to thigh.  Wrapped 1st 3 toes with mollelast due to swelling, 6 cm to foot Roman sandal, then 8 cm ASH pattern. 12 cm ankle to knee, then 12 cm below knee to mid thigh, and additional 12 cm knee to groin, then last 12 cm mid shin to groin. Checked capillary refill and was good.    11/27/23: Manual Therapy MLD to Rt LE: Short neck, superficial and deep abdominals, Rt axillary nodes, Rt ingiuno-axillary anastomosis, then Rt LE as follows: lateral thigh, medial to latera, 4 techniques at knee, ant and post lower leg, ankle, retro malleoli, dorsal foot/toes then retraced all steps. While  performing instructed pt in importance of her continuing self MLD at home. But when bandages on only needs to focus on pretreat, axillary nodes and anastomosis as done today. Reviewed correct technique and directionality.  Compression Bandaging to Rt LE as follows: Cocoa butter, Medium TG soft foot to thigh, Small amt of artiflex to foot and ankle and then at pop fossa, then Comprilan soft 10 cm ankle to knee, and 15 cm  comprilan knee to thigh.  6 cm to foot Roman sandal, then 8 cm ASH pattern. 12 cm ankle to knee, then 12 cm below knee to mid thigh, and additional 12 cm knee to groin, then last 12 cm mid shin to groin. Pts husband did the 6 cm and cuing offered for reinforcement throughout, mostly not to pull bandage but instead offer constant tension throughout. Checked capillary refill and was good. Assisted pt with donning on her velcro sandals. Self Care Instructed pts husband when he was applying 6 cm bandage, see above. Then at end of session explained difference between Active phase of CDT treatment, which we are doing now, then we work to transition to the Maintenance Phase with her new flat knit garment and then they don't have to cont bandaging IF she remains compliant and consistent with daily wear of stocking.  11/25/2023 Measured lower leg with some reduction since wearing stocking Pts husband instructed in compression bandaging . Instructed first by therapist and then pts husband demonstrated. Therapist applied Medium TG soft foot to thigh, Small amt of artiflex to foot and ankle, then Comprilan soft 10 cm ankle to knee, and 15 cm  comprilan knee to thigh. 6 cm to foot with figure 8's, then 8 cm with heel locks and circles up leg. 12 cm mid leg to knee, then 12 cm below knee to mid thigh, and additional 12 cm knee to groin. Pts husband videotaped therapist wrapping, and also performed 6 cm and 8 cm wrap 2 x's each and observed therapist do the remainder due to time. Checked capillary refill  and was good. Pt put on her velcro sandals. Left out toe wraps at pt requuest.  11/20/2023   PATIENT EDUCATION:  Education details: POC, LOS, TREATMENT interventions; Twyla Mort name to call re: adjusting Flexi, Discussed compression stockings maintain, vs bandaging reduces and is very impt part of CDT in addition to MLD or pump. Person educated: Patient and Spouse Education method: Explanation Education comprehension: verbalized understanding  HOME EXERCISE PROGRAM:   ASSESSMENT:  CLINICAL IMPRESSION: Pt had to remove wrap last night due to too tight, and her husband rewrapped with foot too tight. She pulled 1st wrap off leaving the remainder and the dorsum of her foot and toes were swollen. Leg is greatly improved throughout. Pt advised to launder wraps this weekend as they are getting stretched out.  EVAL Patient is a 73 y.o. female who was seen today for physical therapy evaluation and treatment for progression of her right  LE lymphedema. . She has lymphostatic lymphedema with congestion and hyperkeratosis. She has been treated for Right LE lymphedema most recently 2 years ago and has a Flexi touch that no longer fits properly due to 30 lb wt. Loss.  Her husband was previously independent in compression bandaging, and is willing to help his wife but will need review. We discussed importance of compression to reduce her leg as she questioned why her leg continues to swell despite using a pump..She has compression stockings but has not been wearing them. She will benefit from skilled therapy to address deficits and return pt to a more fxl lifestyle.  OBJECTIVE IMPAIRMENTS: decreased activity tolerance, decreased knowledge of condition, increased edema, and postural dysfunction.   ACTIVITY LIMITATIONS: locomotion level  PARTICIPATION LIMITATIONS: no restrictions  PERSONAL FACTORS: chronic lymphedema, abdominal surgery for cervical cancer removal with 40 lymph nodes removed, right total  knee in June 2022.  are also affecting patient's functional outcome.   REHAB POTENTIAL: Good  CLINICAL DECISION MAKING: Stable/uncomplicated  EVALUATION COMPLEXITY: Low   GOALS: Goals reviewed with patient? Yes  SHORT TERM GOALS: Target date: 12/18/2023  Pt/ husband will be able to do self compression bandaging  Baseline: Goal status: INITIAL  2.  Pts husband will perform MLD or may use compression pump with MLD prn Baseline:  Goal status: INITIAL  3.  Pt will reduce at 10 cm prox to suprapatella by 1.5 cm Baseline:  Goal status: INITIAL  4.  Pt will reduce at 20 cm prox to floor by 2 cm Baseline:  Goal status: INITIAL  5.  Pt will call flexi touch and will have garment refit due to her wt. loss Baseline:  Goal status: INITIAL  LONG TERM GOALS: Target date: 01/15/2024  Pt will have appropriate day  time garments to control lymphedema Baseline:  Goal status:  INITIAL  2.  Pt reports she knows how to control her lymphedema at home with lymph drainage manually or with pump, compression and exercise  Baseline:  Goal status: INITIAL  3.  Pt pt will reduce at 10 cm prox to patella by 3 cm to demonstrate improved swelling Baseline:  Goal status: INITIAL  4.  Pt will reduce at 20 cm prox to floor by 4.5 cmt o demonstrate improved swelling Baseline:  Goal status: INITIAL  PLAN:  PT FREQUENCY: 3x/week  PT DURATION: 8 weeks  PLANNED INTERVENTIONS: 97164- PT Re-evaluation, 97110-Therapeutic exercises, 97535- Self Care, [id number]- Manual therapy, 97760- Orthotic Initial, [id number]- Orthotic/Prosthetic subsequent, Manual lymph drainage, and Compression bandaging  PLAN FOR NEXT SESSION: Cont to instruct pts husband in wrapping as they go out of town in 10 days, MLD, measure weekly   Grayce JINNY Sheldon, PT 11/29/2023, 11:02 AM

## 2023-12-02 ENCOUNTER — Ambulatory Visit

## 2023-12-02 DIAGNOSIS — Z90710 Acquired absence of both cervix and uterus: Secondary | ICD-10-CM

## 2023-12-02 DIAGNOSIS — C561 Malignant neoplasm of right ovary: Secondary | ICD-10-CM | POA: Diagnosis not present

## 2023-12-02 DIAGNOSIS — C531 Malignant neoplasm of exocervix: Secondary | ICD-10-CM

## 2023-12-02 DIAGNOSIS — I890 Lymphedema, not elsewhere classified: Secondary | ICD-10-CM | POA: Diagnosis not present

## 2023-12-02 LAB — GENECONNECT MOLECULAR SCREEN: Genetic Analysis Overall Interpretation: NEGATIVE

## 2023-12-02 NOTE — Telephone Encounter (Signed)
 Patient returned call to discuss upcoming procedure.      Health status review:  Any new medical conditions, recent signs of acute illness or been started on antibiotics? No Any recent hospitalizations or surgeries? Cataract extraction on 9/9. Pt reports that she continued taking Eliquis  prior.  Any new medications started since pre-op visit? No  Follow all medication instructions prior to procedure or the procedure may be rescheduled:    Continue taking Eliquis  (Apixaban ) twice daily without missing any doses before procedure. Essential chronic medications:  No medication should be continued, unless told otherwise. HOLD Semaglutide (Ozempic, Rybelsus, Piqua) for 7 days prior to your procedure. Last dose on September 26. On the morning of your procedure DO NOT take any medication., including Eliquis  (Apixaban ).  Nothing to eat or drink after midnight prior to your procedure.  Pre-procedure testing scheduled: CT on September 17 and lab work on September 17.  Confirmed patient is scheduled for Atrial Fibrillation Ablation on Friday, October 10 with Dr. Soyla Norton. Instructed patient to arrive at the Main Entrance A at [facility]: [address] [place], [place] [address] and check in at Admitting at 5:30 AM.  Advised of plan to go home the same day and will only stay overnight if medically necessary. You MUST have a responsible adult to drive you home and MUST be with you the first 24 hours after you arrive home or your procedure could be cancelled.  Informed patient a nurse will call a day before the procedure to confirm arrival time and ensure instructions are followed.  Patient verbalized understanding to information provided and is agreeable to proceed with procedure.   Advised patient to contact RN Navigator at [phone], to inform of any new medications started after call or concerns prior to procedure.

## 2023-12-02 NOTE — Therapy (Signed)
 OUTPATIENT PHYSICAL THERAPY  LOWER EXTREMITY ONCOLOGY TREATMENT  Patient Name: Savannah Hill MRN: [id number] DOB:1951-01-10, 73 y.o., female Today's Date: 12/02/2023  END OF SESSION:  PT End of Session - 12/02/23 1000     Visit Number 5    Number of Visits 24    Date for PT Re-Evaluation 01/15/24    PT Start Time 1003    PT Stop Time 1101    PT Time Calculation (min) 58 min    Activity Tolerance Patient tolerated treatment well    Behavior During Therapy [facility] for tasks assessed/performed          Past Medical History:  Diagnosis Date   Anemia    borderline   Atrial fibrillation (HCC)    a. s/p ablation on 10/05/2016   Cancer (HCC)    cervical/rad hysterectomy/bso wiht chemo for small cell ca   Dyspnea    Heart valve disorder    HLD (hyperlipidemia)    Hypertension    Joint pain    Lower extremity edema    Obesity    Palpitations    Past Surgical History:  Procedure Laterality Date   ABLATION OF DYSRHYTHMIC FOCUS  10/05/2016   ATRIAL FIBRILLATION ABLATION N/A 10/05/2016   Procedure: Atrial Fibrillation Ablation;  Surgeon: Inocencio Soyla Lunger, MD;  Location: [facility] INVASIVE CV LAB;  Service: Cardiovascular;  Laterality: N/A;   IR RADIOLOGY PERIPHERAL GUIDED IV START  09/28/2016   IR US  GUIDE VASC ACCESS RIGHT  09/28/2016   RADICAL ABDOMINAL HYSTERECTOMY  1986   with BSO   REPLACEMENT TOTAL KNEE Right 2022   TEE WITHOUT CARDIOVERSION N/A 06/07/2016   Procedure: TRANSESOPHAGEAL ECHOCARDIOGRAM (TEE);  Surgeon: Jerel Balding, MD;  Location: [facility] ENDOSCOPY;  Service: Cardiovascular;  Laterality: N/A;   Patient Active Problem List   Diagnosis Date Noted   History of CVA (cerebrovascular accident) 05/28/2021   Hyperlipidemia 06/17/2019   Class 1 obesity due to excess calories with serious comorbidity and body mass index (BMI) of 32.0 to 32.9 in adult 12/05/2018   Postmenopausal 12/05/2018   Family history of diabetes mellitus in father 12/05/2018   Impaired fasting  glucose 12/05/2018   Small cell carcinoma (HCC)- of the cervix 12/05/2018   Hip pain, bilateral 05/15/2018   Essential hypertension 01/29/2018   Chronic insomnia 01/29/2018   MDD (major depressive disorder), single episode 01/29/2018   Excessive drinking of alcohol  01/29/2018   Chronic anticoagulation 10/06/2016   Mitral valve disease    PAF (paroxysmal atrial fibrillation) (HCC) 07/01/2013   LAFB (left anterior fascicular block) 07/01/2013   Lymphedema 07/01/2013   h/o Cervical cancer 10/01/2011   H/O total hysterectomy 10/01/2011    PCP:   REFERRING PROVIDER: Ronal Pinal, MD  REFERRING DIAG: Right LE Lymphedema  THERAPY DIAG:  Malignant neoplasm of exocervix (HCC)  Lymphedema, not elsewhere classified  S/P total hysterectomy  ONSET DATE: April 2025 exacerbation  Rationale for Evaluation and Treatment: Rehabilitation  SUBJECTIVE:  SUBJECTIVE STATEMENT:  I took your wrap off on "Sunday and my leg looked really good. I wore the flat knit stocking all day. I think my leg is doing well.  EVAL I have not been wearing garments but I have been using the pump every night. I am not using the Flexi Touch because It doesn't fit properly since I lost 30 lbs and I don't know how to adjust it. I have been using a basic pump and it does OK and my leg reduces but by the next day swelling returns. She has an Ablation for A Fib October 10. In 2 weeks [place]. Pt has old compression stockings, a Flexi touch and another basic style pump, and a Sigvaris night garment.   PERTINENT HISTORY:  radical hysterectomy in 1986 for small cell cervical endocrine cancer with 40 pelvic lymph nodes removed with chemo, no radiation. She developed some swelling after the procedure but has noticed and  increase as she has gotten older and with some weight gain. She has a totat knee June 2022 and has had an increase in lymphedema since. She has an extremity pump from [employer] that she got in June 2022 and a Flexi touch that she received in 2023.. She has been treated here from 02/24/2021-06/2021 PAIN:  Are you having pain? No  PRECAUTIONS: Right LE lymphedema, prior CVA,Anemia, cervical CA, HLD, HTN, A-Fib, s/p TKA   RED FLAGS: None   WEIGHT BEARING RESTRICTIONS: No  FALLS:  Has patient fallen in last 6 months? No  LIVING ENVIRONMENT: Lives with: lives with their spouse Lives in: House/apartment Stairs: No;    OCCUPATION: Retired  LEISURE: read, cross word puzzles  PRIOR LEVEL OF FUNCTION: Independent  PATIENT GOALS: Decrease swelling right LE   OBJECTIVE: Note: Objective measures were completed at Evaluation unless otherwise noted.  COGNITION: Overall cognitive status: Within functional limits for tasks assessed   PALPATION: No pitting edema  OBSERVATIONS / OTHER ASSESSMENTS: significant right LE swelling, non pitting, but relatively soft  SENSATION: Light touch: Deficits     POSTURE: Round shoulders, forward head  LYMPHEDEMA ASSESSMENTS:   SURGERY TYPE/DATE: 03/19/1984  NUMBER OF LYMPH NODES REMOVED: 40  CHEMOTHERAPY: yes  RADIATION:  HORMONE TREATMENT: no  INFECTIONS:   LYMPHEDEMA ASSESSMENTS:   LOWER EXTREMITY LANDMARK RIGHT eval RIGHT 11/25/2023   At groin 61.9    30 cm proximal to suprapatella     20"  cm proximal to suprapatella 58.2  56.3  10 cm proximal to suprapatella 51.4  49.5  At midpatella / popliteal crease 38.1    30 cm proximal to floor at lateral plantar foot 41.9 40.5 38.8  20 cm proximal to floor at lateral plantar foot 39.2 39 35.2  10 cm proximal to floor at lateral plantar foot 32.7 31.4 30.0  Circumference of ankle/heel     5 cm proximal to 1st MTP joint 21.7    Across MTP joint 21.9    Around proximal great toe 8.0  7.5   (Blank rows = not tested)  LOWER EXTREMITY LANDMARK LEFT eval  At groin 59.5  30 cm proximal to suprapatella   20 cm proximal to suprapatella 56.5  10 cm proximal to suprapatella 45.2  At midpatella / popliteal crease 34.9  30 cm proximal to floor at lateral plantar foot 37.9  20 cm proximal to floor at lateral plantar foot 30.6  10 cm proximal to floor at lateral plantar foot 22.8  Circumference  of ankle/heel   5 cm proximal to 1st MTP joint 20.9  Across MTP joint 20.5  Around proximal great toe 7.3  (Blank rows = not tested)  FUNCTIONAL TESTS:    GAIT:WFL   Outcome measure:LLIS: 22%                                                                                                                            TREATMENT DATE:   12/02/2023 Pt measured with excellent results despite being in stocking today Manual Therapy MLD to Rt LE: Short neck, superficial and deep abdominals, Rt axillary nodes, Rt ingiuno-axillary anastomosis, then Rt LE as follows: lateral thigh, medial to latera, 4 techniques at knee, ant and post lower leg, ankle, retro malleoli, dorsal foot/toes then retraced all steps.   Compression Bandaging to Rt LE as follows: Cocoa butter, Medium TG soft foot to thigh, Small amt of artiflex to foot and ankle and then at pop fossa, then Comprilan soft 10 cm ankle to knee, and 15 cm  comprilan knee to thigh.  Wrapped 1st 3 toes with mollelast due to swelling, 6 cm to foot Roman sandal, then 8 cm ASH pattern. 12 cm ankle to knee, then 12 cm below knee to mid thigh, and additional 12 cm knee to groin, then last 12 cm mid shin to groin. Checked capillary refill and was good.     11/29/2023 Manual Therapy MLD to Rt LE: Short neck, superficial and deep abdominals, Rt axillary nodes, Rt ingiuno-axillary anastomosis, then Rt LE as follows: lateral thigh, medial to latera, 4 techniques at knee, ant and post lower leg, ankle, retro malleoli, dorsal foot/toes then retraced all steps.    Compression Bandaging to Rt LE as follows: Cocoa butter, Medium TG soft foot to thigh, Small amt of artiflex to foot and ankle and then at pop fossa, then Comprilan soft 10 cm ankle to knee, and 15 cm  comprilan knee to thigh.  Wrapped 1st 3 toes with mollelast due to swelling, 6 cm to foot Roman sandal, then 8 cm ASH pattern. 12 cm ankle to knee, then 12 cm below knee to mid thigh, and additional 12 cm knee to groin, then last 12 cm mid shin to groin. Checked capillary refill and was good.    11/27/23: Manual Therapy MLD to Rt LE: Short neck, superficial and deep abdominals, Rt axillary nodes, Rt ingiuno-axillary anastomosis, then Rt LE as follows: lateral thigh, medial to latera, 4 techniques at knee, ant and post lower leg, ankle, retro malleoli, dorsal foot/toes then retraced all steps. While performing instructed pt in importance of her continuing self MLD at home. But when bandages on only needs to focus on pretreat, axillary nodes and anastomosis as done today. Reviewed correct technique and directionality.  Compression Bandaging to Rt LE as follows: Cocoa butter, Medium TG soft foot to thigh, Small amt of artiflex to foot and ankle and then at pop fossa, then Comprilan soft 10 cm ankle to  knee, and 15 cm  comprilan knee to thigh.  6 cm to foot Roman sandal, then 8 cm ASH pattern. 12 cm ankle to knee, then 12 cm below knee to mid thigh, and additional 12 cm knee to groin, then last 12 cm mid shin to groin. Pts husband did the 6 cm and cuing offered for reinforcement throughout, mostly not to pull bandage but instead offer constant tension throughout. Checked capillary refill and was good. Assisted pt with donning on her velcro sandals. Self Care Instructed pts husband when he was applying 6 cm bandage, see above. Then at end of session explained difference between Active phase of CDT treatment, which we are doing now, then we work to transition to the Maintenance Phase with her new flat knit garment  and then they don't have to cont bandaging IF she remains compliant and consistent with daily wear of stocking.   11/25/2023 Measured lower leg with some reduction since wearing stocking Pts husband instructed in compression bandaging . Instructed first by therapist and then pts husband demonstrated. Therapist applied Medium TG soft foot to thigh, Small amt of artiflex to foot and ankle, then Comprilan soft 10 cm ankle to knee, and 15 cm  comprilan knee to thigh. 6 cm to foot with figure 8's, then 8 cm with heel locks and circles up leg. 12 cm mid leg to knee, then 12 cm below knee to mid thigh, and additional 12 cm knee to groin. Pts husband videotaped therapist wrapping, and also performed 6 cm and 8 cm wrap 2 x's each and observed therapist do the remainder due to time. Checked capillary refill and was good. Pt put on her velcro sandals. Left out toe wraps at pt requuest.  11/20/2023   PATIENT EDUCATION:  Education details: POC, LOS, TREATMENT interventions; Twyla Mort name to call re: adjusting Flexi, Discussed compression stockings maintain, vs bandaging reduces and is very impt part of CDT in addition to MLD or pump. Person educated: Patient and Spouse Education method: Explanation Education comprehension: verbalized understanding  HOME EXERCISE PROGRAM:   ASSESSMENT:  CLINICAL IMPRESSION: Pt continues to demonstrate excellent improvement in measurements. 1 more appt this week then she is out of town for several weeks and her husband will wrap.  EVAL Patient is a 73 y.o. female who was seen today for physical therapy evaluation and treatment for progression of her right  LE lymphedema. . She has lymphostatic lymphedema with congestion and hyperkeratosis. She has been treated for Right LE lymphedema most recently 2 years ago and has a Flexi touch that no longer fits properly due to 30 lb wt. Loss.  Her husband was previously independent in compression bandaging, and is willing to help his  wife but will need review. We discussed importance of compression to reduce her leg as she questioned why her leg continues to swell despite using a pump..She has compression stockings but has not been wearing them. She will benefit from skilled therapy to address deficits and return pt to a more fxl lifestyle.  OBJECTIVE IMPAIRMENTS: decreased activity tolerance, decreased knowledge of condition, increased edema, and postural dysfunction.   ACTIVITY LIMITATIONS: locomotion level  PARTICIPATION LIMITATIONS: no restrictions  PERSONAL FACTORS: chronic lymphedema, abdominal surgery for cervical cancer removal with 40 lymph nodes removed, right total knee in June 2022.  are also affecting patient's functional outcome.   REHAB POTENTIAL: Good  CLINICAL DECISION MAKING: Stable/uncomplicated  EVALUATION COMPLEXITY: Low   GOALS: Goals reviewed with patient? Yes  SHORT TERM GOALS:  Target date: 12/18/2023  Pt/ husband will be able to do self compression bandaging  Baseline: Goal status: INITIAL  2.  Pts husband will perform MLD or may use compression pump with MLD prn Baseline:  Goal status: INITIAL  3.  Pt will reduce at 10 cm prox to suprapatella by 1.5 cm Baseline:  Goal status: INITIAL  4.  Pt will reduce at 20 cm prox to floor by 2 cm Baseline:  Goal status: INITIAL  5.  Pt will call flexi touch and will have garment refit due to her wt. loss Baseline:  Goal status: INITIAL  LONG TERM GOALS: Target date: 01/15/2024  Pt will have appropriate day  time garments to control lymphedema Baseline:  Goal status: INITIAL  2.  Pt reports she knows how to control her lymphedema at home with lymph drainage manually or with pump, compression and exercise  Baseline:  Goal status: INITIAL  3.  Pt pt will reduce at 10 cm prox to patella by 3 cm to demonstrate improved swelling Baseline:  Goal status: INITIAL  4.  Pt will reduce at 20 cm prox to floor by 4.5 cmt o demonstrate  improved swelling Baseline:  Goal status: INITIAL  PLAN:  PT FREQUENCY: 3x/week  PT DURATION: 8 weeks  PLANNED INTERVENTIONS: 97164- PT Re-evaluation, 97110-Therapeutic exercises, 97535- Self Care, [id number]- Manual therapy, 97760- Orthotic Initial, [id number]- Orthotic/Prosthetic subsequent, Manual lymph drainage, and Compression bandaging  PLAN FOR NEXT SESSION: Cont to instruct pts husband in wrapping as they go out of town in 10 days, MLD, measure weekly   Grayce JINNY Sheldon, PT 12/02/2023, 11:02 AM

## 2023-12-02 NOTE — Addendum Note (Signed)
 Addended by: Kahiau Schewe O on: 12/02/2023 09:34 AM   Modules accepted: Orders

## 2023-12-04 ENCOUNTER — Ambulatory Visit (HOSPITAL_COMMUNITY)
Admission: RE | Admit: 2023-12-04 | Discharge: 2023-12-04 | Disposition: A | Discharge status: Home / Self Care | Source: Ambulatory Visit | Attending: Cardiovascular Disease | Admitting: Cardiovascular Disease

## 2023-12-04 DIAGNOSIS — I517 Cardiomegaly: Secondary | ICD-10-CM | POA: Diagnosis not present

## 2023-12-04 DIAGNOSIS — I480 Paroxysmal atrial fibrillation: Secondary | ICD-10-CM | POA: Diagnosis not present

## 2023-12-04 MED ORDER — IOHEXOL 350 MG/ML SOLN
80.0000 mL | Freq: Once | INTRAVENOUS | Status: AC | PRN
  Administered 2023-12-04: 80 mL via INTRAVENOUS

## 2023-12-05 ENCOUNTER — Ambulatory Visit

## 2023-12-05 DIAGNOSIS — C531 Malignant neoplasm of exocervix: Secondary | ICD-10-CM

## 2023-12-05 DIAGNOSIS — I890 Lymphedema, not elsewhere classified: Secondary | ICD-10-CM | POA: Diagnosis not present

## 2023-12-05 DIAGNOSIS — Z90710 Acquired absence of both cervix and uterus: Secondary | ICD-10-CM | POA: Diagnosis not present

## 2023-12-05 DIAGNOSIS — C561 Malignant neoplasm of right ovary: Secondary | ICD-10-CM | POA: Diagnosis not present

## 2023-12-05 LAB — BASIC METABOLIC PANEL WITH GFR
BUN/Creatinine Ratio: 12 (ref 12–28)
BUN: 10 mg/dL (ref 8–27)
CO2: 23 mmol/L (ref 20–29)
Calcium: 9.3 mg/dL (ref 8.7–10.3)
Chloride: 99 mmol/L (ref 96–106)
Creatinine, Ser: 0.83 mg/dL (ref 0.57–1.00)
Glucose: 81 mg/dL (ref 70–99)
Potassium: 3.7 mmol/L (ref 3.5–5.2)
Sodium: 139 mmol/L (ref 134–144)
eGFR: 75 mL/min/1.73 (ref >59)

## 2023-12-05 LAB — CBC
Hematocrit: 39.4 % (ref 34.0–46.6)
Hemoglobin: 12.8 g/dL (ref 11.1–15.9)
MCH: 31.8 pg (ref 26.6–33.0)
MCHC: 32.5 g/dL (ref 31.5–35.7)
MCV: 98 fL — ABNORMAL HIGH (ref 79–97)
Platelets: 400 x10E3/uL (ref 150–450)
RBC: 4.03 x10E6/uL (ref 3.77–5.28)
RDW: 13 % (ref 11.7–15.4)
WBC: 5.9 x10E3/uL (ref 3.4–10.8)

## 2023-12-05 NOTE — Therapy (Signed)
 OUTPATIENT PHYSICAL THERAPY  LOWER EXTREMITY ONCOLOGY TREATMENT  Patient Name: Savannah Hill MRN: [id number] DOB:Apr 29, 1950, 73 y.o., female Today's Date: 12/05/2023  END OF SESSION:  PT End of Session - 12/05/23 1001     Visit Number 6    Number of Visits 24    Date for Recertification  01/15/24    PT Start Time 1002    PT Stop Time 1058    PT Time Calculation (min) 56 min    Activity Tolerance Patient tolerated treatment well    Behavior During Therapy [facility] for tasks assessed/performed          Past Medical History:  Diagnosis Date   Anemia    borderline   Atrial fibrillation (HCC)    a. s/p ablation on 10/05/2016   Cancer (HCC)    cervical/rad hysterectomy/bso wiht chemo for small cell ca   Dyspnea    Heart valve disorder    HLD (hyperlipidemia)    Hypertension    Joint pain    Lower extremity edema    Obesity    Palpitations    Past Surgical History:  Procedure Laterality Date   ABLATION OF DYSRHYTHMIC FOCUS  10/05/2016   ATRIAL FIBRILLATION ABLATION N/A 10/05/2016   Procedure: Atrial Fibrillation Ablation;  Surgeon: Inocencio Soyla Lunger, MD;  Location: [facility] INVASIVE CV LAB;  Service: Cardiovascular;  Laterality: N/A;   IR RADIOLOGY PERIPHERAL GUIDED IV START  09/28/2016   IR US  GUIDE VASC ACCESS RIGHT  09/28/2016   RADICAL ABDOMINAL HYSTERECTOMY  1986   with BSO   REPLACEMENT TOTAL KNEE Right 2022   TEE WITHOUT CARDIOVERSION N/A 06/07/2016   Procedure: TRANSESOPHAGEAL ECHOCARDIOGRAM (TEE);  Surgeon: Jerel Balding, MD;  Location: [facility] ENDOSCOPY;  Service: Cardiovascular;  Laterality: N/A;   Patient Active Problem List   Diagnosis Date Noted   History of CVA (cerebrovascular accident) 05/28/2021   Hyperlipidemia 06/17/2019   Class 1 obesity due to excess calories with serious comorbidity and body mass index (BMI) of 32.0 to 32.9 in adult 12/05/2018   Postmenopausal 12/05/2018   Family history of diabetes mellitus in father 12/05/2018   Impaired fasting  glucose 12/05/2018   Small cell carcinoma (HCC)- of the cervix 12/05/2018   Hip pain, bilateral 05/15/2018   Essential hypertension 01/29/2018   Chronic insomnia 01/29/2018   MDD (major depressive disorder), single episode 01/29/2018   Excessive drinking of alcohol  01/29/2018   Chronic anticoagulation 10/06/2016   Mitral valve disease    PAF (paroxysmal atrial fibrillation) (HCC) 07/01/2013   LAFB (left anterior fascicular block) 07/01/2013   Lymphedema 07/01/2013   h/o Cervical cancer 10/01/2011   H/O total hysterectomy 10/01/2011    PCP:   REFERRING PROVIDER: Ronal Pinal, MD  REFERRING DIAG: Right LE Lymphedema  THERAPY DIAG:  Malignant neoplasm of exocervix (HCC)  Lymphedema, not elsewhere classified  S/P total hysterectomy  ONSET DATE: April 2025 exacerbation  Rationale for Evaluation and Treatment: Rehabilitation  SUBJECTIVE:  SUBJECTIVE STATEMENT:   The wrap did really well and my leg still looks good.  EVAL I have not been wearing garments but I have been using the pump every night. I am not using the Flexi Touch because It doesn't fit properly since I lost 30 lbs and I don't know how to adjust it. I have been using a basic pump and it does OK and my leg reduces but by the next day swelling returns. She has an Ablation for A Fib October 10. In 2 weeks we go to the [facility]  for 10 days. Pt has old compression stockings, a Flexi touch and another basic style pump, and a Sigvaris night garment.   PERTINENT HISTORY:  radical hysterectomy in 1986 for small cell cervical endocrine cancer with 40 pelvic lymph nodes removed with chemo, no radiation. She developed some swelling after the procedure but has noticed and increase as she has gotten older and with some weight gain. She has a  totat knee June 2022 and has had an increase in lymphedema since. She has an extremity pump from [employer] that she got in June 2022 and a Flexi touch that she received in 2023.[name] She has been treated here from 02/24/2021-06/2021 PAIN:  Are you having pain? No  PRECAUTIONS: Right LE lymphedema, prior CVA,Anemia, cervical CA, HLD, HTN, A-Fib, s/p TKA   RED FLAGS: None   WEIGHT BEARING RESTRICTIONS: No  FALLS:  Has patient fallen in last 6 months? No  LIVING ENVIRONMENT: Lives with: lives with their spouse Lives in: House/apartment Stairs: No;    OCCUPATION: Retired  LEISURE: read, cross word puzzles  PRIOR LEVEL OF FUNCTION: Independent  PATIENT GOALS: Decrease swelling right LE   OBJECTIVE: Note: Objective measures were completed at Evaluation unless otherwise noted.  COGNITION: Overall cognitive status: Within functional limits for tasks assessed   PALPATION: No pitting edema  OBSERVATIONS / OTHER ASSESSMENTS: significant right LE swelling, non pitting, but relatively soft  SENSATION: Light touch: Deficits     POSTURE: Round shoulders, forward head  LYMPHEDEMA ASSESSMENTS:   SURGERY TYPE/DATE: 03/19/1984  NUMBER OF LYMPH NODES REMOVED: 40  CHEMOTHERAPY: yes  RADIATION:  HORMONE TREATMENT: no  INFECTIONS:   LYMPHEDEMA ASSESSMENTS:   LOWER EXTREMITY LANDMARK RIGHT eval RIGHT 11/25/2023 RIGHT 12/02/2023  At groin 61.9    30 cm proximal to suprapatella     20 cm proximal to suprapatella 58.2  56.3  10 cm proximal to suprapatella 51.4  49.5  At midpatella / popliteal crease 38.1    30 cm proximal to floor at lateral plantar foot 41.9 40.5 38.8  20 cm proximal to floor at lateral plantar foot 39.2 39 35.2  10 cm proximal to floor at lateral plantar foot 32.7 31.4 30.0  Circumference of ankle/heel     5 cm proximal to 1st MTP joint 21.7    Across MTP joint 21.9    Around proximal great toe 8.0  7.5  (Blank rows = not tested)  LOWER EXTREMITY LANDMARK  LEFT eval  At groin 59.5  30 cm proximal to suprapatella   20 cm proximal to suprapatella 56.5  10 cm proximal to suprapatella 45.2  At midpatella / popliteal crease 34.9  30 cm proximal to floor at lateral plantar foot 37.9  20 cm proximal to floor at lateral plantar foot 30.6  10 cm proximal to floor at lateral plantar foot 22.8  Circumference of ankle/heel   5 cm proximal to 1st MTP joint 20.9  Across MTP  joint 20.5  Around proximal great toe 7.3  (Blank rows = not tested)  FUNCTIONAL TESTS:    GAIT:WFL   Outcome measure:LLIS: 22%                                                                                                                            TREATMENT DATE:   12/05/2023 Manual Therapy MLD to Rt LE: Short neck, superficial and deep abdominals, Rt axillary nodes, Rt ingiuno-axillary anastomosis, then Rt LE as follows: lateral thigh, medial to latera, 4 techniques at knee, ant and post lower leg, ankle, retro malleoli, dorsal foot/toes then retraced all steps.   Compression Bandaging to Rt LE as follows: Cocoa butter, Medium TG soft foot to thigh, Small amt of artiflex to foot and ankle and then at pop fossa, then Comprilan soft 10 cm ankle to knee, and 15 cm  comprilan knee to thigh.  Wrapped 1st 3 toes with mollelast due to swelling, 6 cm to foot Roman sandal, then 8 cm ASH pattern. 12 cm ankle to knee, then 12 cm below knee to mid thigh, and additional 12 cm knee to groin, then last 12 cm mid shin to groin. Checked capillary refill and was good.   12/02/2023 Pt measured with excellent results despite being in stocking today Manual Therapy MLD to Rt LE: Short neck, superficial and deep abdominals, Rt axillary nodes, Rt ingiuno-axillary anastomosis, then Rt LE as follows: lateral thigh, medial to latera, 4 techniques at knee, ant and post lower leg, ankle, retro malleoli, dorsal foot/toes then retraced all steps.   Compression Bandaging to Rt LE as follows: Cocoa butter,  Medium TG soft foot to thigh, Small amt of artiflex to foot and ankle and then at pop fossa, then Comprilan soft 10 cm ankle to knee, and 15 cm  comprilan knee to thigh.  Wrapped 1st 3 toes with mollelast due to swelling, 6 cm to foot Roman sandal, then 8 cm ASH pattern. 12 cm ankle to knee, then 12 cm below knee to mid thigh, and additional 12 cm knee to groin, then last 12 cm mid shin to groin. Checked capillary refill and was good.     11/29/2023 Manual Therapy MLD to Rt LE: Short neck, superficial and deep abdominals, Rt axillary nodes, Rt ingiuno-axillary anastomosis, then Rt LE as follows: lateral thigh, medial to latera, 4 techniques at knee, ant and post lower leg, ankle, retro malleoli, dorsal foot/toes then retraced all steps.   Compression Bandaging to Rt LE as follows: Cocoa butter, Medium TG soft foot to thigh, Small amt of artiflex to foot and ankle and then at pop fossa, then Comprilan soft 10 cm ankle to knee, and 15 cm  comprilan knee to thigh.  Wrapped 1st 3 toes with mollelast due to swelling, 6 cm to foot Roman sandal, then 8 cm ASH pattern. 12 cm ankle to knee, then 12 cm below knee to mid thigh, and additional 12 cm knee to groin,  then last 12 cm mid shin to groin. Checked capillary refill and was good.    11/27/23: Manual Therapy MLD to Rt LE: Short neck, superficial and deep abdominals, Rt axillary nodes, Rt ingiuno-axillary anastomosis, then Rt LE as follows: lateral thigh, medial to latera, 4 techniques at knee, ant and post lower leg, ankle, retro malleoli, dorsal foot/toes then retraced all steps. While performing instructed pt in importance of her continuing self MLD at home. But when bandages on only needs to focus on pretreat, axillary nodes and anastomosis as done today. Reviewed correct technique and directionality.  Compression Bandaging to Rt LE as follows: Cocoa butter, Medium TG soft foot to thigh, Small amt of artiflex to foot and ankle and then at pop fossa, then  Comprilan soft 10 cm ankle to knee, and 15 cm  comprilan knee to thigh.  6 cm to foot Roman sandal, then 8 cm ASH pattern. 12 cm ankle to knee, then 12 cm below knee to mid thigh, and additional 12 cm knee to groin, then last 12 cm mid shin to groin. Pts husband did the 6 cm and cuing offered for reinforcement throughout, mostly not to pull bandage but instead offer constant tension throughout. Checked capillary refill and was good. Assisted pt with donning on her velcro sandals. Self Care Instructed pts husband when he was applying 6 cm bandage, see above. Then at end of session explained difference between Active phase of CDT treatment, which we are doing now, then we work to transition to the Maintenance Phase with her new flat knit garment and then they don't have to cont bandaging IF she remains compliant and consistent with daily wear of stocking.   11/25/2023 Measured lower leg with some reduction since wearing stocking Pts husband instructed in compression bandaging . Instructed first by therapist and then pts husband demonstrated. Therapist applied Medium TG soft foot to thigh, Small amt of artiflex to foot and ankle, then Comprilan soft 10 cm ankle to knee, and 15 cm  comprilan knee to thigh. 6 cm to foot with figure 8's, then 8 cm with heel locks and circles up leg. 12 cm mid leg to knee, then 12 cm below knee to mid thigh, and additional 12 cm knee to groin. Pts husband videotaped therapist wrapping, and also performed 6 cm and 8 cm wrap 2 x's each and observed therapist do the remainder due to time. Checked capillary refill and was good. Pt put on her velcro sandals. Left out toe wraps at pt requuest.  11/20/2023   PATIENT EDUCATION:  Education details: POC, LOS, TREATMENT interventions; Twyla Mort name to call re: adjusting Flexi, Discussed compression stockings maintain, vs bandaging reduces and is very impt part of CDT in addition to MLD or pump. Person educated: Patient and  Spouse Education method: Explanation Education comprehension: verbalized understanding  HOME EXERCISE PROGRAM:   ASSESSMENT:  CLINICAL IMPRESSION: Pts leg continues to look excellent Pt is out of town for several weeks and her husband will wrap while away. Advised to launder stocking and another set of wraps before she leaves.  EVAL Patient is a 73 y.o. female who was seen today for physical therapy evaluation and treatment for progression of her right  LE lymphedema. . She has lymphostatic lymphedema with congestion and hyperkeratosis. She has been treated for Right LE lymphedema most recently 2 years ago and has a Flexi touch that no longer fits properly due to 30 lb wt. Loss.  Her husband was previously independent in compression  bandaging, and is willing to help his wife but will need review. We discussed importance of compression to reduce her leg as she questioned why her leg continues to swell despite using a pump..She has compression stockings but has not been wearing them. She will benefit from skilled therapy to address deficits and return pt to a more fxl lifestyle.  OBJECTIVE IMPAIRMENTS: decreased activity tolerance, decreased knowledge of condition, increased edema, and postural dysfunction.   ACTIVITY LIMITATIONS: locomotion level  PARTICIPATION LIMITATIONS: no restrictions  PERSONAL FACTORS: chronic lymphedema, abdominal surgery for cervical cancer removal with 40 lymph nodes removed, right total knee in June 2022.  are also affecting patient's functional outcome.   REHAB POTENTIAL: Good  CLINICAL DECISION MAKING: Stable/uncomplicated  EVALUATION COMPLEXITY: Low   GOALS: Goals reviewed with patient? Yes  SHORT TERM GOALS: Target date: 12/18/2023 // Pt/ husband will be able to do self compression bandaging  Baseline: Goal status: INITIAL  2.  Pts husband will perform MLD or may use compression pump with MLD prn Baseline:  Goal status: INITIAL  3.  Pt will  reduce at 10 cm prox to suprapatella by 1.5 cm Baseline:  Goal status: INITIAL  4.  Pt will reduce at 20 cm prox to floor by 2 cm Baseline:  Goal status: MET  5.  Pt will call flexi touch and will have garment refit due to her wt. loss Baseline:  Goal status: INITIAL  LONG TERM GOALS: Target date: 01/15/2024  Pt will have appropriate day  time garments to control lymphedema Baseline:  Goal status: INITIAL  2.  Pt reports she knows how to control her lymphedema at home with lymph drainage manually or with pump, compression and exercise  Baseline:  Goal status: INITIAL  3.  Pt pt will reduce at 10 cm prox to patella by 3 cm to demonstrate improved swelling Baseline:  Goal status: INITIAL  4.  Pt will reduce at 20 cm prox to floor by 4.5 cmt o demonstrate improved swelling Baseline:  Goal status: INITIAL  PLAN:  PT FREQUENCY: 3x/week  PT DURATION: 8 weeks  PLANNED INTERVENTIONS: [id number]- PT Re-evaluation, 97110-Therapeutic exercises, [id number]- Self Care, [id number]- Manual therapy, [id number]- Orthotic Initial, [id number]- Orthotic/Prosthetic subsequent, Manual lymph drainage, and Compression bandaging  PLAN FOR NEXT SESSION: Cont to instruct pts husband in wrapping,  out of town in 10 days, MLD, measure weekly. Measure for garment on return if leg looks good.   Grayce JINNY Sheldon, PT 12/05/2023, 11:01 AM

## 2023-12-06 ENCOUNTER — Other Ambulatory Visit (HOSPITAL_COMMUNITY)

## 2023-12-16 ENCOUNTER — Other Ambulatory Visit: Payer: Self-pay | Admitting: Cardiology

## 2023-12-16 DIAGNOSIS — I480 Paroxysmal atrial fibrillation: Secondary | ICD-10-CM

## 2023-12-16 NOTE — Telephone Encounter (Signed)
 Prescription refill request for Eliquis  received. Indication: PAF Last office visit: 08/26/23  Savannah Norton MD Scr: 0.83 on 12/04/23  Epic Age: 73 Weight: 69kg  Based on above findings Eliquis  5mg  twice daily is the appropriate dose.  Refill approved.

## 2023-12-17 DIAGNOSIS — H2511 Age-related nuclear cataract, right eye: Secondary | ICD-10-CM | POA: Diagnosis not present

## 2023-12-17 DIAGNOSIS — H268 Other specified cataract: Secondary | ICD-10-CM | POA: Diagnosis not present

## 2023-12-17 DIAGNOSIS — I4891 Unspecified atrial fibrillation: Secondary | ICD-10-CM | POA: Diagnosis not present

## 2023-12-18 ENCOUNTER — Ambulatory Visit: Attending: Obstetrics & Gynecology

## 2023-12-18 DIAGNOSIS — I890 Lymphedema, not elsewhere classified: Secondary | ICD-10-CM | POA: Diagnosis not present

## 2023-12-18 DIAGNOSIS — C531 Malignant neoplasm of exocervix: Secondary | ICD-10-CM | POA: Diagnosis not present

## 2023-12-18 DIAGNOSIS — Z90710 Acquired absence of both cervix and uterus: Secondary | ICD-10-CM | POA: Insufficient documentation

## 2023-12-18 NOTE — Therapy (Signed)
 OUTPATIENT PHYSICAL THERAPY  LOWER EXTREMITY ONCOLOGY TREATMENT  Patient Name: Savannah Hill MRN: [id number] DOB:12/09/1950, 73 y.o., female Today's Date: 12/18/2023  END OF SESSION:  PT End of Session - 12/18/23 1203     Visit Number 7    Number of Visits 24    Date for Recertification  01/15/24    PT Start Time 1203    PT Stop Time 1308    PT Time Calculation (min) 65 min    Activity Tolerance Patient tolerated treatment well    Behavior During Therapy [facility] for tasks assessed/performed          Past Medical History:  Diagnosis Date   Anemia    borderline   Atrial fibrillation (HCC)    a. s/p ablation on 10/05/2016   Cancer (HCC)    cervical/rad hysterectomy/bso wiht chemo for small cell ca   Dyspnea    Heart valve disorder    HLD (hyperlipidemia)    Hypertension    Joint pain    Lower extremity edema    Obesity    Palpitations    Past Surgical History:  Procedure Laterality Date   ABLATION OF DYSRHYTHMIC FOCUS  10/05/2016   ATRIAL FIBRILLATION ABLATION N/A 10/05/2016   Procedure: Atrial Fibrillation Ablation;  Surgeon: Inocencio Soyla Lunger, MD;  Location: [facility] INVASIVE CV LAB;  Service: Cardiovascular;  Laterality: N/A;   IR RADIOLOGY PERIPHERAL GUIDED IV START  09/28/2016   IR US  GUIDE VASC ACCESS RIGHT  09/28/2016   RADICAL ABDOMINAL HYSTERECTOMY  1986   with BSO   REPLACEMENT TOTAL KNEE Right 2022   TEE WITHOUT CARDIOVERSION N/A 06/07/2016   Procedure: TRANSESOPHAGEAL ECHOCARDIOGRAM (TEE);  Surgeon: Jerel Balding, MD;  Location: [facility] ENDOSCOPY;  Service: Cardiovascular;  Laterality: N/A;   Patient Active Problem List   Diagnosis Date Noted   History of CVA (cerebrovascular accident) 05/28/2021   Hyperlipidemia 06/17/2019   Class 1 obesity due to excess calories with serious comorbidity and body mass index (BMI) of 32.0 to 32.9 in adult 12/05/2018   Postmenopausal 12/05/2018   Family history of diabetes mellitus in father 12/05/2018   Impaired fasting  glucose 12/05/2018   Small cell carcinoma (HCC)- of the cervix 12/05/2018   Hip pain, bilateral 05/15/2018   Essential hypertension 01/29/2018   Chronic insomnia 01/29/2018   MDD (major depressive disorder), single episode 01/29/2018   Excessive drinking of alcohol  01/29/2018   Chronic anticoagulation 10/06/2016   Mitral valve disease    PAF (paroxysmal atrial fibrillation) (HCC) 07/01/2013   LAFB (left anterior fascicular block) 07/01/2013   Lymphedema 07/01/2013   h/o Cervical cancer 10/01/2011   H/O total hysterectomy 10/01/2011    PCP:   REFERRING PROVIDER: Ronal Pinal, MD  REFERRING DIAG: Right LE Lymphedema  THERAPY DIAG:  Malignant neoplasm of exocervix (HCC)  Lymphedema, not elsewhere classified  S/P total hysterectomy  ONSET DATE: April 2025 exacerbation  Rationale for Evaluation and Treatment: Rehabilitation  SUBJECTIVE:  SUBJECTIVE STATEMENT:   We got home from our trip Monday night, and I had cataract surgery Tuesday. Next Friday I have a heart ablation.My leg did well with the wrap. We took it off Sat am for the wedding, but put it on "Sunday and Monday.  I didn't wear my stocking for probably 5 days during the trip. It doesn't look horrible now, but its swollen.   EVAL I have not been wearing garments but I have been using the pump every night. I am not using the Flexi Touch because It doesn't fit properly since I lost 30 lbs and I don't know how to adjust it. I have been using a basic pump and it does OK and my leg reduces but by the next day swelling returns. She has an Ablation for A Fib October 10. In 2 weeks [place] 10 days. Pt has old compression stockings, a Flexi touch and another basic style pump, and a Sigvaris night garment.   PERTINENT HISTORY:   radical hysterectomy in 1986 for small cell cervical endocrine cancer with 40 pelvic lymph nodes removed with chemo, no radiation. She developed some swelling after the procedure but has noticed and increase as she has gotten older and with some weight gain. She has a totat knee June 2022 and has had an increase in lymphedema since. She has an extremity pump from [employer] that she got in June 2022 and a Flexi touch that she received in 2023.. She has been treated here from 02/24/2021-06/2021 PAIN:  Are you having pain? No  PRECAUTIONS: Right LE lymphedema, prior CVA,Anemia, cervical CA, HLD, HTN, A-Fib, s/p TKA   RED FLAGS: None   WEIGHT BEARING RESTRICTIONS: No  FALLS:  Has patient fallen in last 6 months? No  LIVING ENVIRONMENT: Lives with: lives with their spouse Lives in: House/apartment Stairs: No;    OCCUPATION: Retired  LEISURE: read, cross word puzzles  PRIOR LEVEL OF FUNCTION: Independent  PATIENT GOALS: Decrease swelling right LE   OBJECTIVE: Note: Objective measures were completed at Evaluation unless otherwise noted.  COGNITION: Overall cognitive status: Within functional limits for tasks assessed   PALPATION: No pitting edema  OBSERVATIONS / OTHER ASSESSMENTS: significant right LE swelling, non pitting, but relatively soft  SENSATION: Light touch: Deficits     POSTURE: Round shoulders, forward head  LYMPHEDEMA ASSESSMENTS:   SURGERY TYPE/DATE: 03/19/1984  NUMBER OF LYMPH NODES REMOVED: 40  CHEMOTHERAPY: yes  RADIATION:  HORMONE TREATMENT: no  INFECTIONS:   LYMPHEDEMA ASSESSMENTS:   LOWER EXTREMITY LANDMARK RIGHT eval RIGHT 11/25/2023 RIGHT 12/02/2023 RIGHT 12/18/2023  At groin 61.9     30 cm proximal to suprapatella      20 cm proximal to suprapatella 58.2  56.3   10 cm proximal to suprapatella 51.4  49.5   At midpatella / popliteal crease 38.1     30"  cm proximal to floor at lateral plantar foot 41.9 40.5 38.8 39.7  20 cm proximal to floor  at lateral plantar foot 39.2 39 35.2 37.6  10 cm proximal to floor at lateral plantar foot 32.7 31.4 30.0 32.3  Circumference of ankle/heel      5 cm proximal to 1st MTP joint 21.7     Across MTP joint 21.9     Around proximal great toe 8.0  7.5 7.7  (Blank rows = not tested)  LOWER EXTREMITY LANDMARK LEFT eval  At groin 59.5  30 cm proximal to suprapatella   20 cm  proximal to suprapatella 56.5  10 cm proximal to suprapatella 45.2  At midpatella / popliteal crease 34.9  30 cm proximal to floor at lateral plantar foot 37.9  20 cm proximal to floor at lateral plantar foot 30.6  10 cm proximal to floor at lateral plantar foot 22.8  Circumference of ankle/heel   5 cm proximal to 1st MTP joint 20.9  Across MTP joint 20.5  Around proximal great toe 7.3  (Blank rows = not tested)  FUNCTIONAL TESTS:    GAIT:WFL   Outcome measure:LLIS: 22%                                                                                                                            TREATMENT DATE:   12/18/2023 Measured lower leg; increased throughout secondary to pt not wearing stocking for 5/10 days while away on her trip MLD to Rt LE: Short neck, superficial and deep abdominals, Rt axillary nodes, Rt ingiuno-axillary anastomosis, then Rt LE as follows: lateral thigh, medial to latera, 4 techniques at knee, ant and post lower leg, ankle, retro malleoli, dorsal foot/toes then retraced all steps.   Compression Bandaging to Rt LE as follows: Cocoa butter, Medium TG soft foot to thigh, Small amt of artiflex to foot and ankle and then at pop fossa, then Comprilan soft 10 cm ankle to knee, and 15 cm  comprilan knee to thigh.  Wrapped 1st 3 toes with mollelast due to swelling, 6 cm to foot Roman sandal, then 8 cm ASH pattern. 12 cm ankle to knee, then 12 cm below knee to mid thigh, and additional 12 cm knee to groin, then last 12 cm mid shin to groin. Checked capillary refill and was good  12/05/2023 Manual  Therapy MLD to Rt LE: Short neck, superficial and deep abdominals, Rt axillary nodes, Rt ingiuno-axillary anastomosis, then Rt LE as follows: lateral thigh, medial to latera, 4 techniques at knee, ant and post lower leg, ankle, retro malleoli, dorsal foot/toes then retraced all steps.   Compression Bandaging to Rt LE as follows: Cocoa butter, Medium TG soft foot to thigh, Small amt of artiflex to foot and ankle and then at pop fossa, then Comprilan soft 10 cm ankle to knee, and 15 cm  comprilan knee to thigh.  Wrapped 1st 3 toes with mollelast due to swelling, 6 cm to foot Roman sandal, then 8 cm ASH pattern. 12 cm ankle to knee, then 12 cm below knee to mid thigh, and additional 12 cm knee to groin, then last 12 cm mid shin to groin. Checked capillary refill and was good.   12/02/2023 Pt measured with excellent results despite being in stocking today Manual Therapy MLD to Rt LE: Short neck, superficial and deep abdominals, Rt axillary nodes, Rt ingiuno-axillary anastomosis, then Rt LE as follows: lateral thigh, medial to latera, 4 techniques at knee, ant and post lower leg, ankle, retro malleoli, dorsal foot/toes then retraced all steps.   Compression Bandaging to  Rt LE as follows: Cocoa butter, Medium TG soft foot to thigh, Small amt of artiflex to foot and ankle and then at pop fossa, then Comprilan soft 10 cm ankle to knee, and 15 cm  comprilan knee to thigh.  Wrapped 1st 3 toes with mollelast due to swelling, 6 cm to foot Roman sandal, then 8 cm ASH pattern. 12 cm ankle to knee, then 12 cm below knee to mid thigh, and additional 12 cm knee to groin, then last 12 cm mid shin to groin. Checked capillary refill and was good.     11/29/2023 Manual Therapy MLD to Rt LE: Short neck, superficial and deep abdominals, Rt axillary nodes, Rt ingiuno-axillary anastomosis, then Rt LE as follows: lateral thigh, medial to latera, 4 techniques at knee, ant and post lower leg, ankle, retro malleoli, dorsal foot/toes  then retraced all steps.   Compression Bandaging to Rt LE as follows: Cocoa butter, Medium TG soft foot to thigh, Small amt of artiflex to foot and ankle and then at pop fossa, then Comprilan soft 10 cm ankle to knee, and 15 cm  comprilan knee to thigh.  Wrapped 1st 3 toes with mollelast due to swelling, 6 cm to foot Roman sandal, then 8 cm ASH pattern. 12 cm ankle to knee, then 12 cm below knee to mid thigh, and additional 12 cm knee to groin, then last 12 cm mid shin to groin. Checked capillary refill and was good.    11/27/23: Manual Therapy MLD to Rt LE: Short neck, superficial and deep abdominals, Rt axillary nodes, Rt ingiuno-axillary anastomosis, then Rt LE as follows: lateral thigh, medial to latera, 4 techniques at knee, ant and post lower leg, ankle, retro malleoli, dorsal foot/toes then retraced all steps. While performing instructed pt in importance of her continuing self MLD at home. But when bandages on only needs to focus on pretreat, axillary nodes and anastomosis as done today. Reviewed correct technique and directionality.  Compression Bandaging to Rt LE as follows: Cocoa butter, Medium TG soft foot to thigh, Small amt of artiflex to foot and ankle and then at pop fossa, then Comprilan soft 10 cm ankle to knee, and 15 cm  comprilan knee to thigh.  6 cm to foot Roman sandal, then 8 cm ASH pattern. 12 cm ankle to knee, then 12 cm below knee to mid thigh, and additional 12 cm knee to groin, then last 12 cm mid shin to groin. Pts husband did the 6 cm and cuing offered for reinforcement throughout, mostly not to pull bandage but instead offer constant tension throughout. Checked capillary refill and was good. Assisted pt with donning on her velcro sandals. Self Care Instructed pts husband when he was applying 6 cm bandage, see above. Then at end of session explained difference between Active phase of CDT treatment, which we are doing now, then we work to transition to the Maintenance Phase with  her new flat knit garment and then they don't have to cont bandaging IF she remains compliant and consistent with daily wear of stocking.   11/25/2023 Measured lower leg with some reduction since wearing stocking Pts husband instructed in compression bandaging . Instructed first by therapist and then pts husband demonstrated. Therapist applied Medium TG soft foot to thigh, Small amt of artiflex to foot and ankle, then Comprilan soft 10 cm ankle to knee, and 15 cm  comprilan knee to thigh. 6 cm to foot with figure 8's, then 8 cm with heel locks and circles up leg.  12 cm mid leg to knee, then 12 cm below knee to mid thigh, and additional 12 cm knee to groin. Pts husband videotaped therapist wrapping, and also performed 6 cm and 8 cm wrap 2 x's each and observed therapist do the remainder due to time. Checked capillary refill and was good. Pt put on her velcro sandals. Left out toe wraps at pt requuest.  11/20/2023   PATIENT EDUCATION:  Education details: POC, LOS, TREATMENT interventions; Twyla Mort name to call re: adjusting Flexi, Discussed compression stockings maintain, vs bandaging reduces and is very impt part of CDT in addition to MLD or pump. Person educated: Patient and Spouse Education method: Explanation Education comprehension: verbalized understanding  HOME EXERCISE PROGRAM:   ASSESSMENT:  CLINICAL IMPRESSION:  Pts right leg with increased swelling due to being on vacation for 10 days and not wearing stocking on a daily basis. Continued CDT today. Pt will need to be measured for garments soon when leg reduces again.  EVAL Patient is a 73 y.o. female who was seen today for physical therapy evaluation and treatment for progression of her right  LE lymphedema. . She has lymphostatic lymphedema with congestion and hyperkeratosis. She has been treated for Right LE lymphedema most recently 2 years ago and has a Flexi touch that no longer fits properly due to 30 lb wt. Loss.  Her husband  was previously independent in compression bandaging, and is willing to help his wife but will need review. We discussed importance of compression to reduce her leg as she questioned why her leg continues to swell despite using a pump..She has compression stockings but has not been wearing them. She will benefit from skilled therapy to address deficits and return pt to a more fxl lifestyle.  OBJECTIVE IMPAIRMENTS: decreased activity tolerance, decreased knowledge of condition, increased edema, and postural dysfunction.   ACTIVITY LIMITATIONS: locomotion level  PARTICIPATION LIMITATIONS: no restrictions  PERSONAL FACTORS: chronic lymphedema, abdominal surgery for cervical cancer removal with 40 lymph nodes removed, right total knee in June 2022.  are also affecting patient's functional outcome.   REHAB POTENTIAL: Good  CLINICAL DECISION MAKING: Stable/uncomplicated  EVALUATION COMPLEXITY: Low   GOALS: Goals reviewed with patient? Yes  SHORT TERM GOALS: Target date: 12/18/2023 // Pt/ husband will be able to do self compression bandaging  Baseline: Goal status: INITIAL  2.  Pts husband will perform MLD or may use compression pump with MLD prn Baseline:  Goal status: INITIAL  3.  Pt will reduce at 10 cm prox to suprapatella by 1.5 cm Baseline:  Goal status: INITIAL  4.  Pt will reduce at 20 cm prox to floor by 2 cm Baseline:  Goal status: MET  5.  Pt will call flexi touch and will have garment refit due to her wt. loss Baseline:  Goal status: INITIAL  LONG TERM GOALS: Target date: 01/15/2024  Pt will have appropriate day  time garments to control lymphedema Baseline:  Goal status: INITIAL  2.  Pt reports she knows how to control her lymphedema at home with lymph drainage manually or with pump, compression and exercise  Baseline:  Goal status: INITIAL  3.  Pt pt will reduce at 10 cm prox to patella by 3 cm to demonstrate improved swelling Baseline:  Goal status:  INITIAL  4.  Pt will reduce at 20 cm prox to floor by 4.5 cmt o demonstrate improved swelling Baseline:  Goal status: INITIAL  PLAN:  PT FREQUENCY: 3x/week  PT DURATION: 8 weeks  PLANNED INTERVENTIONS: [id number]- PT Re-evaluation, 97110-Therapeutic exercises, [id number]- Self Care, [id number]- Manual therapy, 97760- Orthotic Initial, [id number]- Orthotic/Prosthetic subsequent, Manual lymph drainage, and Compression bandaging  PLAN FOR NEXT SESSION: Cont to instruct pts husband in wrapping,  out of town in 10 days, MLD, measure weekly. Measure for garment on return if leg looks good.   Grayce JINNY Sheldon, PT 12/18/2023, 1:17 PM

## 2023-12-22 ENCOUNTER — Encounter: Payer: Self-pay | Admitting: Cardiology

## 2023-12-23 MED ORDER — DILTIAZEM HCL ER COATED BEADS 120 MG PO CP24: 120.0000 mg | ORAL_CAPSULE | Freq: Every day | ORAL | 2 refills | Status: DC

## 2023-12-24 ENCOUNTER — Ambulatory Visit

## 2023-12-24 DIAGNOSIS — I890 Lymphedema, not elsewhere classified: Secondary | ICD-10-CM

## 2023-12-24 DIAGNOSIS — C531 Malignant neoplasm of exocervix: Secondary | ICD-10-CM

## 2023-12-24 DIAGNOSIS — Z90710 Acquired absence of both cervix and uterus: Secondary | ICD-10-CM | POA: Diagnosis not present

## 2023-12-24 NOTE — Therapy (Signed)
 OUTPATIENT PHYSICAL THERAPY  LOWER EXTREMITY ONCOLOGY TREATMENT  Patient Name: Savannah Hill MRN: [id number] DOB:1950/12/16, 73 y.o., female Today's Date: 12/24/2023  END OF SESSION:  PT End of Session - 12/24/23 1400     Visit Number 8    Number of Visits 24    Date for Recertification  01/15/24    PT Start Time 1400    PT Stop Time 1454    PT Time Calculation (min) 54 min    Activity Tolerance Patient tolerated treatment well    Behavior During Therapy [facility] for tasks assessed/performed          Past Medical History:  Diagnosis Date   Anemia    borderline   Atrial fibrillation (HCC)    a. s/p ablation on 10/05/2016   Cancer (HCC)    cervical/rad hysterectomy/bso wiht chemo for small cell ca   Dyspnea    Heart valve disorder    HLD (hyperlipidemia)    Hypertension    Joint pain    Lower extremity edema    Obesity    Palpitations    Past Surgical History:  Procedure Laterality Date   ABLATION OF DYSRHYTHMIC FOCUS  10/05/2016   ATRIAL FIBRILLATION ABLATION N/A 10/05/2016   Procedure: Atrial Fibrillation Ablation;  Surgeon: Inocencio Soyla Lunger, MD;  Location: [facility] INVASIVE CV LAB;  Service: Cardiovascular;  Laterality: N/A;   IR RADIOLOGY PERIPHERAL GUIDED IV START  09/28/2016   IR US  GUIDE VASC ACCESS RIGHT  09/28/2016   RADICAL ABDOMINAL HYSTERECTOMY  1986   with BSO   REPLACEMENT TOTAL KNEE Right 2022   TEE WITHOUT CARDIOVERSION N/A 06/07/2016   Procedure: TRANSESOPHAGEAL ECHOCARDIOGRAM (TEE);  Surgeon: Jerel Balding, MD;  Location: [facility] ENDOSCOPY;  Service: Cardiovascular;  Laterality: N/A;   Patient Active Problem List   Diagnosis Date Noted   History of CVA (cerebrovascular accident) 05/28/2021   Hyperlipidemia 06/17/2019   Class 1 obesity due to excess calories with serious comorbidity and body mass index (BMI) of 32.0 to 32.9 in adult 12/05/2018   Postmenopausal 12/05/2018   Family history of diabetes mellitus in father 12/05/2018   Impaired fasting  glucose 12/05/2018   Small cell carcinoma (HCC)- of the cervix 12/05/2018   Hip pain, bilateral 05/15/2018   Essential hypertension 01/29/2018   Chronic insomnia 01/29/2018   MDD (major depressive disorder), single episode 01/29/2018   Excessive drinking of alcohol  01/29/2018   Chronic anticoagulation 10/06/2016   Mitral valve disease    PAF (paroxysmal atrial fibrillation) (HCC) 07/01/2013   LAFB (left anterior fascicular block) 07/01/2013   Lymphedema 07/01/2013   h/o Cervical cancer 10/01/2011   H/O total hysterectomy 10/01/2011    PCP:   REFERRING PROVIDER: Ronal Pinal, MD  REFERRING DIAG: Right LE Lymphedema  THERAPY DIAG:  Malignant neoplasm of exocervix (HCC)  Lymphedema, not elsewhere classified  S/P total hysterectomy  ONSET DATE: April 2025 exacerbation  Rationale for Evaluation and Treatment: Rehabilitation  SUBJECTIVE:  SUBJECTIVE STATEMENT:   I have my stocking on today. I have my ablation on Friday. I am supposed to face time with Flexi touch next week.    EVAL I have not been wearing garments but I have been using the pump every night. I am not using the Flexi Touch because It doesn't fit properly since I lost 30 lbs and I don't know how to adjust it. I have been using a basic pump and it does OK and my leg reduces but by the next day swelling returns. She has an Ablation for A Fib October 10. In 2 weeks we go to the [facility]  for 10 days. Pt has old compression stockings, a Flexi touch and another basic style pump, and a Sigvaris night garment.   PERTINENT HISTORY:  radical hysterectomy in 1986 for small cell cervical endocrine cancer with 40 pelvic lymph nodes removed with chemo, no radiation. She developed some swelling after the procedure but has noticed and increase as  she has gotten older and with some weight gain. She has a totat knee June 2022 and has had an increase in lymphedema since. She has an extremity pump from [employer] that she got in June 2022 and a Flexi touch that she received in 2023.[name] She has been treated here from 02/24/2021-06/2021 PAIN:  Are you having pain? No  PRECAUTIONS: Right LE lymphedema, prior CVA,Anemia, cervical CA, HLD, HTN, A-Fib, s/p TKA   RED FLAGS: None   WEIGHT BEARING RESTRICTIONS: No  FALLS:  Has patient fallen in last 6 months? No  LIVING ENVIRONMENT: Lives with: lives with their spouse Lives in: House/apartment Stairs: No;    OCCUPATION: Retired  LEISURE: read, cross word puzzles  PRIOR LEVEL OF FUNCTION: Independent  PATIENT GOALS: Decrease swelling right LE   OBJECTIVE: Note: Objective measures were completed at Evaluation unless otherwise noted.  COGNITION: Overall cognitive status: Within functional limits for tasks assessed   PALPATION: No pitting edema  OBSERVATIONS / OTHER ASSESSMENTS: significant right LE swelling, non pitting, but relatively soft  SENSATION: Light touch: Deficits     POSTURE: Round shoulders, forward head  LYMPHEDEMA ASSESSMENTS:   SURGERY TYPE/DATE: 03/19/1984  NUMBER OF LYMPH NODES REMOVED: 40  CHEMOTHERAPY: yes  RADIATION:  HORMONE TREATMENT: no  INFECTIONS:   LYMPHEDEMA ASSESSMENTS:   LOWER EXTREMITY LANDMARK RIGHT eval RIGHT 11/25/2023 RIGHT 12/02/2023 RIGHT 12/18/2023  At groin 61.9     30 cm proximal to suprapatella      20 cm proximal to suprapatella 58.2  56.3   10 cm proximal to suprapatella 51.4  49.5   At midpatella / popliteal crease 38.1     30 cm proximal to floor at lateral plantar foot 41.9 40.5 38.8 39.7  20 cm proximal to floor at lateral plantar foot 39.2 39 35.2 37.6  10 cm proximal to floor at lateral plantar foot 32.7 31.4 30.0 32.3  Circumference of ankle/heel      5 cm proximal to 1st MTP joint 21.7     Across MTP joint 21.9      Around proximal great toe 8.0  7.5 7.7  (Blank rows = not tested)  LOWER EXTREMITY LANDMARK LEFT eval  At groin 59.5  30 cm proximal to suprapatella   20 cm proximal to suprapatella 56.5  10 cm proximal to suprapatella 45.2  At midpatella / popliteal crease 34.9  30 cm proximal to floor at lateral plantar foot 37.9  20 cm proximal to floor at lateral plantar foot 30.6  10 cm proximal to floor at lateral plantar foot 22.8  Circumference of ankle/heel   5 cm proximal to 1st MTP joint 20.9  Across MTP joint 20.5  Around proximal great toe 7.3  (Blank rows = not tested)  FUNCTIONAL TESTS:    GAIT:WFL   Outcome measure:LLIS: 22%                                                                                                                            TREATMENT DATE:  12/24/2023 MLD to Rt LE: Short neck, superficial and deep abdominals, Rt axillary nodes, Rt ingiuno-axillary anastomosis, then Rt LE as follows: lateral thigh, medial to latera, 4 techniques at knee, ant and post lower leg, ankle, retro malleoli, dorsal foot/toes then retraced all steps.   Compression Bandaging to Rt LE as follows: Cocoa butter, Medium TG soft foot to thigh, Small amt of artiflex to foot and ankle and then at pop fossa, then Comprilan soft 10 cm ankle to knee, and 15 cm  comprilan knee to thigh.  Wrapped 1st 3 toes with mollelast due to swelling, 6 cm to foot Roman sandal, then 8 cm ASH pattern. 12 cm ankle to knee, then 12 cm below knee to mid thigh, and additional 12 cm knee to groin, then last 12 cm mid shin to groin. Checked capillary refill and was good   12/18/2023 Measured lower leg; increased throughout secondary to pt not wearing stocking for 5/10 days while away on her trip MLD to Rt LE: Short neck, superficial and deep abdominals, Rt axillary nodes, Rt ingiuno-axillary anastomosis, then Rt LE as follows: lateral thigh, medial to latera, 4 techniques at knee, ant and post lower leg, ankle, retro  malleoli, dorsal foot/toes then retraced all steps.   Compression Bandaging to Rt LE as follows: Cocoa butter, Medium TG soft foot to thigh, Small amt of artiflex to foot and ankle and then at pop fossa, then Comprilan soft 10 cm ankle to knee, and 15 cm  comprilan knee to thigh.  Wrapped 1st 3 toes with mollelast due to swelling, 6 cm to foot Roman sandal, then 8 cm ASH pattern. 12 cm ankle to knee, then 12 cm below knee to mid thigh, and additional 12 cm knee to groin, then last 12 cm mid shin to groin. Checked capillary refill and was good  12/05/2023 Manual Therapy MLD to Rt LE: Short neck, superficial and deep abdominals, Rt axillary nodes, Rt ingiuno-axillary anastomosis, then Rt LE as follows: lateral thigh, medial to latera, 4 techniques at knee, ant and post lower leg, ankle, retro malleoli, dorsal foot/toes then retraced all steps.   Compression Bandaging to Rt LE as follows: Cocoa butter, Medium TG soft foot to thigh, Small amt of artiflex to foot and ankle and then at pop fossa, then Comprilan soft 10 cm ankle to knee, and 15 cm  comprilan knee to thigh.  Wrapped 1st 3 toes with mollelast due to swelling, 6 cm to  foot Roman sandal, then 8 cm ASH pattern. 12 cm ankle to knee, then 12 cm below knee to mid thigh, and additional 12 cm knee to groin, then last 12 cm mid shin to groin. Checked capillary refill and was good.   12/02/2023 Pt measured with excellent results despite being in stocking today Manual Therapy MLD to Rt LE: Short neck, superficial and deep abdominals, Rt axillary nodes, Rt ingiuno-axillary anastomosis, then Rt LE as follows: lateral thigh, medial to latera, 4 techniques at knee, ant and post lower leg, ankle, retro malleoli, dorsal foot/toes then retraced all steps.   Compression Bandaging to Rt LE as follows: Cocoa butter, Medium TG soft foot to thigh, Small amt of artiflex to foot and ankle and then at pop fossa, then Comprilan soft 10 cm ankle to knee, and 15 cm  comprilan  knee to thigh.  Wrapped 1st 3 toes with mollelast due to swelling, 6 cm to foot Roman sandal, then 8 cm ASH pattern. 12 cm ankle to knee, then 12 cm below knee to mid thigh, and additional 12 cm knee to groin, then last 12 cm mid shin to groin. Checked capillary refill and was good.     11/29/2023 Manual Therapy MLD to Rt LE: Short neck, superficial and deep abdominals, Rt axillary nodes, Rt ingiuno-axillary anastomosis, then Rt LE as follows: lateral thigh, medial to latera, 4 techniques at knee, ant and post lower leg, ankle, retro malleoli, dorsal foot/toes then retraced all steps.   Compression Bandaging to Rt LE as follows: Cocoa butter, Medium TG soft foot to thigh, Small amt of artiflex to foot and ankle and then at pop fossa, then Comprilan soft 10 cm ankle to knee, and 15 cm  comprilan knee to thigh.  Wrapped 1st 3 toes with mollelast due to swelling, 6 cm to foot Roman sandal, then 8 cm ASH pattern. 12 cm ankle to knee, then 12 cm below knee to mid thigh, and additional 12 cm knee to groin, then last 12 cm mid shin to groin. Checked capillary refill and was good.    11/27/23: Manual Therapy MLD to Rt LE: Short neck, superficial and deep abdominals, Rt axillary nodes, Rt ingiuno-axillary anastomosis, then Rt LE as follows: lateral thigh, medial to latera, 4 techniques at knee, ant and post lower leg, ankle, retro malleoli, dorsal foot/toes then retraced all steps. While performing instructed pt in importance of her continuing self MLD at home. But when bandages on only needs to focus on pretreat, axillary nodes and anastomosis as done today. Reviewed correct technique and directionality.  Compression Bandaging to Rt LE as follows: Cocoa butter, Medium TG soft foot to thigh, Small amt of artiflex to foot and ankle and then at pop fossa, then Comprilan soft 10 cm ankle to knee, and 15 cm  comprilan knee to thigh.  6 cm to foot Roman sandal, then 8 cm ASH pattern. 12 cm ankle to knee, then 12 cm  below knee to mid thigh, and additional 12 cm knee to groin, then last 12 cm mid shin to groin. Pts husband did the 6 cm and cuing offered for reinforcement throughout, mostly not to pull bandage but instead offer constant tension throughout. Checked capillary refill and was good. Assisted pt with donning on her velcro sandals. Self Care Instructed pts husband when he was applying 6 cm bandage, see above. Then at end of session explained difference between Active phase of CDT treatment, which we are doing now, then we work to transition  to the Maintenance Phase with her new flat knit garment and then they don't have to cont bandaging IF she remains compliant and consistent with daily wear of stocking.   11/25/2023 Measured lower leg with some reduction since wearing stocking Pts husband instructed in compression bandaging . Instructed first by therapist and then pts husband demonstrated. Therapist applied Medium TG soft foot to thigh, Small amt of artiflex to foot and ankle, then Comprilan soft 10 cm ankle to knee, and 15 cm  comprilan knee to thigh. 6 cm to foot with figure 8's, then 8 cm with heel locks and circles up leg. 12 cm mid leg to knee, then 12 cm below knee to mid thigh, and additional 12 cm knee to groin. Pts husband videotaped therapist wrapping, and also performed 6 cm and 8 cm wrap 2 x's each and observed therapist do the remainder due to time. Checked capillary refill and was good. Pt put on her velcro sandals. Left out toe wraps at pt requuest.  11/20/2023   PATIENT EDUCATION:  Education details: POC, LOS, TREATMENT interventions; Twyla Mort name to call re: adjusting Flexi, Discussed compression stockings maintain, vs bandaging reduces and is very impt part of CDT in addition to MLD or pump. Person educated: Patient and Spouse Education method: Explanation Education comprehension: verbalized understanding  HOME EXERCISE PROGRAM:   ASSESSMENT:  CLINICAL IMPRESSION:  Pts right  leg continues with increased swelling due to being on vacation for 10 days and not wearing stocking on a daily basis. Continued CDT today. Pt will need to be measured for garments soon when leg reduces again. Pt to face time with Flexi touch next week to adjust my garments.  EVAL Patient is a 73 y.o. female who was seen today for physical therapy evaluation and treatment for progression of her right  LE lymphedema. . She has lymphostatic lymphedema with congestion and hyperkeratosis. She has been treated for Right LE lymphedema most recently 2 years ago and has a Flexi touch that no longer fits properly due to 30 lb wt. Loss.  Her husband was previously independent in compression bandaging, and is willing to help his wife but will need review. We discussed importance of compression to reduce her leg as she questioned why her leg continues to swell despite using a pump..She has compression stockings but has not been wearing them. She will benefit from skilled therapy to address deficits and return pt to a more fxl lifestyle.  OBJECTIVE IMPAIRMENTS: decreased activity tolerance, decreased knowledge of condition, increased edema, and postural dysfunction.   ACTIVITY LIMITATIONS: locomotion level  PARTICIPATION LIMITATIONS: no restrictions  PERSONAL FACTORS: chronic lymphedema, abdominal surgery for cervical cancer removal with 40 lymph nodes removed, right total knee in June 2022.  are also affecting patient's functional outcome.   REHAB POTENTIAL: Good  CLINICAL DECISION MAKING: Stable/uncomplicated  EVALUATION COMPLEXITY: Low   GOALS: Goals reviewed with patient? Yes  SHORT TERM GOALS: Target date: 12/18/2023 // Pt/ husband will be able to do self compression bandaging  Baseline: Goal status: INITIAL  2.  Pts husband will perform MLD or may use compression pump with MLD prn Baseline:  Goal status: INITIAL  3.  Pt will reduce at 10 cm prox to suprapatella by 1.5 cm Baseline:  Goal  status: INITIAL  4.  Pt will reduce at 20 cm prox to floor by 2 cm Baseline:  Goal status: MET  5.  Pt will call flexi touch and will have garment refit due to her wt.  loss Baseline:  Goal status: INITIAL  LONG TERM GOALS: Target date: 01/15/2024  Pt will have appropriate day  time garments to control lymphedema Baseline:  Goal status: INITIAL  2.  Pt reports she knows how to control her lymphedema at home with lymph drainage manually or with pump, compression and exercise  Baseline:  Goal status: INITIAL  3.  Pt pt will reduce at 10 cm prox to patella by 3 cm to demonstrate improved swelling Baseline:  Goal status: INITIAL  4.  Pt will reduce at 20 cm prox to floor by 4.5 cmt o demonstrate improved swelling Baseline:  Goal status: INITIAL  PLAN:  PT FREQUENCY: 3x/week  PT DURATION: 8 weeks  PLANNED INTERVENTIONS: [id number]- PT Re-evaluation, 97110-Therapeutic exercises, 97535- Self Care, [id number]- Manual therapy, 97760- Orthotic Initial, [id number]- Orthotic/Prosthetic subsequent, Manual lymph drainage, and Compression bandaging  PLAN FOR NEXT SESSION: Cont to instruct pts husband in wrapping,  out of town in 10 days, MLD, measure weekly. Measure for garment on return if leg looks good.   Grayce JINNY Sheldon, PT 12/24/2023, 2:56 PM

## 2023-12-25 DIAGNOSIS — C44212 Basal cell carcinoma of skin of right ear and external auricular canal: Secondary | ICD-10-CM | POA: Diagnosis not present

## 2023-12-26 ENCOUNTER — Ambulatory Visit

## 2023-12-26 NOTE — Pre-Procedure Instructions (Signed)
 Instructed patient on the following items: Arrival time 0515 Nothing to eat or drink after midnight No meds AM of procedure Responsible person to drive you home and stay with you for 24 hrs  Have you missed any doses of anti-coagulant Eliquis- takes twice a day, hasn't missed any doses in last 4 weeks.  Don't take dose morning of procedure.

## 2023-12-27 ENCOUNTER — Ambulatory Visit (HOSPITAL_COMMUNITY): Admitting: Anesthesiology

## 2023-12-27 ENCOUNTER — Ambulatory Visit (HOSPITAL_COMMUNITY)
Admission: RE | Admit: 2023-12-27 | Discharge: 2023-12-27 | Disposition: A | Discharge status: Home / Self Care | Attending: Cardiology | Admitting: Cardiology

## 2023-12-27 ENCOUNTER — Other Ambulatory Visit: Payer: Self-pay

## 2023-12-27 ENCOUNTER — Ambulatory Visit (HOSPITAL_COMMUNITY)
Admission: RE | Disposition: A | Discharge status: Home / Self Care | Payer: Self-pay | Source: Home / Self Care | Attending: Cardiology

## 2023-12-27 DIAGNOSIS — I4819 Other persistent atrial fibrillation: Secondary | ICD-10-CM | POA: Diagnosis not present

## 2023-12-27 DIAGNOSIS — I10 Essential (primary) hypertension: Secondary | ICD-10-CM | POA: Diagnosis not present

## 2023-12-27 DIAGNOSIS — Z7901 Long term (current) use of anticoagulants: Secondary | ICD-10-CM | POA: Insufficient documentation

## 2023-12-27 DIAGNOSIS — I4891 Unspecified atrial fibrillation: Secondary | ICD-10-CM | POA: Diagnosis not present

## 2023-12-27 DIAGNOSIS — Z8673 Personal history of transient ischemic attack (TIA), and cerebral infarction without residual deficits: Secondary | ICD-10-CM | POA: Insufficient documentation

## 2023-12-27 DIAGNOSIS — Z79899 Other long term (current) drug therapy: Secondary | ICD-10-CM | POA: Diagnosis not present

## 2023-12-27 HISTORY — PX: ATRIAL FIBRILLATION ABLATION: EP1191

## 2023-12-27 MED ORDER — SODIUM CHLORIDE 0.9% FLUSH: 3.0000 mL | Freq: Two times a day (BID) | INTRAVENOUS | Status: DC

## 2023-12-27 MED ORDER — SODIUM CHLORIDE 0.9 % IV SOLN
INTRAVENOUS | Status: DC | PRN
Start: 2023-12-27 — End: 2023-12-27

## 2023-12-27 MED ORDER — MIDAZOLAM HCL 5 MG/5ML IJ SOLN
INTRAMUSCULAR | Status: DC | PRN
  Administered 2023-12-27: 2 mg via INTRAVENOUS

## 2023-12-27 MED ORDER — PHENYLEPHRINE 80 MCG/ML (10ML) SYRINGE FOR IV PUSH (FOR BLOOD PRESSURE SUPPORT)
PREFILLED_SYRINGE | INTRAVENOUS | Status: DC | PRN
Start: 2023-12-27 — End: 2023-12-27
  Administered 2023-12-27: 40 ug via INTRAVENOUS

## 2023-12-27 MED ORDER — PROPOFOL 10 MG/ML IV BOLUS
INTRAVENOUS | Status: DC | PRN
  Administered 2023-12-27: 140 mg via INTRAVENOUS

## 2023-12-27 MED ORDER — FENTANYL CITRATE (PF) 100 MCG/2ML IJ SOLN
INTRAMUSCULAR | Status: DC | PRN
  Administered 2023-12-27 (×2): 50 ug via INTRAVENOUS

## 2023-12-27 MED ORDER — ONDANSETRON HCL 4 MG/2ML IJ SOLN
INTRAMUSCULAR | Status: DC | PRN
Start: 2023-12-27 — End: 2023-12-27
  Administered 2023-12-27: 4 mg via INTRAVENOUS

## 2023-12-27 MED ORDER — SODIUM CHLORIDE 0.9% FLUSH: 3.0000 mL | INTRAVENOUS | Status: DC | PRN

## 2023-12-27 MED ORDER — ONDANSETRON HCL 4 MG/2ML IJ SOLN: 4.0000 mg | Freq: Four times a day (QID) | INTRAMUSCULAR | Status: DC | PRN

## 2023-12-27 MED ORDER — SODIUM CHLORIDE 0.9 % IV SOLN: INTRAVENOUS | Status: DC

## 2023-12-27 MED ORDER — HEPARIN SODIUM (PORCINE) 1000 UNIT/ML IJ SOLN
INTRAMUSCULAR | Status: DC | PRN
  Administered 2023-12-27: 13000 [IU] via INTRAVENOUS
  Administered 2023-12-27: 2000 [IU] via INTRAVENOUS

## 2023-12-27 MED ORDER — PHENYLEPHRINE HCL-NACL 20-0.9 MG/250ML-% IV SOLN
INTRAVENOUS | Status: DC | PRN
  Administered 2023-12-27: 30 ug/min via INTRAVENOUS

## 2023-12-27 MED ORDER — MIDAZOLAM HCL 2 MG/2ML IJ SOLN
INTRAMUSCULAR | Status: AC
  Filled 2023-12-27: qty 2

## 2023-12-27 MED ORDER — HEPARIN (PORCINE) IN NACL 1000-0.9 UT/500ML-% IV SOLN
INTRAVENOUS | Status: DC | PRN
  Administered 2023-12-27 (×3): 500 mL

## 2023-12-27 MED ORDER — FENTANYL CITRATE (PF) 100 MCG/2ML IJ SOLN
INTRAMUSCULAR | Status: AC
  Filled 2023-12-27: qty 2

## 2023-12-27 MED ORDER — SODIUM CHLORIDE 0.9 % IV SOLN: 250.0000 mL | INTRAVENOUS | Status: DC | PRN

## 2023-12-27 MED ORDER — ATROPINE SULFATE 1 MG/10ML IJ SOSY
PREFILLED_SYRINGE | INTRAMUSCULAR | Status: DC | PRN
  Administered 2023-12-27: 1 mg via INTRAVENOUS

## 2023-12-27 MED ORDER — PROTAMINE SULFATE 10 MG/ML IV SOLN
INTRAVENOUS | Status: DC | PRN
  Administered 2023-12-27: 40 mg via INTRAVENOUS

## 2023-12-27 MED ORDER — LIDOCAINE 2% (20 MG/ML) 5 ML SYRINGE
INTRAMUSCULAR | Status: DC | PRN
  Administered 2023-12-27: 50 mg via INTRAVENOUS

## 2023-12-27 MED ORDER — SUGAMMADEX SODIUM 200 MG/2ML IV SOLN
INTRAVENOUS | Status: DC | PRN
  Administered 2023-12-27: 200 mg via INTRAVENOUS

## 2023-12-27 MED ORDER — ROCURONIUM BROMIDE 100 MG/10ML IV SOLN
INTRAVENOUS | Status: DC | PRN
  Administered 2023-12-27: 70 mg via INTRAVENOUS

## 2023-12-27 MED ORDER — ACETAMINOPHEN 325 MG PO TABS: 650.0000 mg | ORAL_TABLET | ORAL | Status: DC | PRN

## 2023-12-27 NOTE — Anesthesia Preprocedure Evaluation (Addendum)
 Anesthesia Evaluation  Patient identified by MRN, date of birth, ID band Patient awake    Reviewed: Allergy & Precautions, H&P , NPO status , Patient's Chart, lab work & pertinent test results  Airway Mallampati: II   Neck ROM: full    Dental  (+) Dental Advisory Given, Teeth Intact   Pulmonary neg pulmonary ROS   breath sounds clear to auscultation       Cardiovascular hypertension, Pt. on medications + dysrhythmias (eliquis , prior ablation 2018) Atrial Fibrillation + Valvular Problems/Murmurs (mild-mod MR) MR  Rhythm:Irregular Rate:Normal  Echo 09/2023  1. Left ventricular ejection fraction, by estimation, is 50 to 55%. The  left ventricle has low normal function. The left ventricle demonstrates  global hypokinesis. Left ventricular diastolic parameters are consistent  with Grade I diastolic dysfunction  (impaired relaxation).   2. Right ventricular systolic function is normal. The right ventricular  size is normal.   3. Left atrial size was mildly dilated.   4. The mitral valve is normal in structure. Mild to moderate mitral valve  regurgitation. No evidence of mitral stenosis.   5. The aortic valve is tricuspid. Aortic valve regurgitation is not  visualized. No aortic stenosis is present.   6. The inferior vena cava is normal in size with greater than 50%  respiratory variability, suggesting right atrial pressure of 3 mmHg.       Neuro/Psych  PSYCHIATRIC DISORDERS  Depression    negative neurological ROS     GI/Hepatic negative GI ROS, Neg liver ROS,,,  Endo/Other  negative endocrine ROS    Renal/GU negative Renal ROS  negative genitourinary   Musculoskeletal negative musculoskeletal ROS (+)    Abdominal   Peds  Hematology negative hematology ROS (+)   Anesthesia Other Findings   Reproductive/Obstetrics negative OB ROS                              Anesthesia Physical Anesthesia  Plan  ASA: 3  Anesthesia Plan: General   Post-op Pain Management: Minimal or no pain anticipated   Induction: Intravenous  PONV Risk Score and Plan: 3 and Ondansetron , Dexamethasone , Treatment may vary due to age or medical condition and Midazolam   Airway Management Planned: Oral ETT  Additional Equipment: None  Intra-op Plan:   Post-operative Plan: Extubation in OR  Informed Consent: I have reviewed the patients History and Physical, chart, labs and discussed the procedure including the risks, benefits and alternatives for the proposed anesthesia with the patient or authorized representative who has indicated his/her understanding and acceptance.       Plan Discussed with: CRNA, Anesthesiologist and Surgeon  Anesthesia Plan Comments: (Last airway note 2018: Ventilation: Mask ventilation without difficulty Laryngoscope Size: Mac and 4 Grade View: Grade I Tube type: Oral Tube size: 7.5 mm Number of attempts: 1 )         Anesthesia Quick Evaluation

## 2023-12-27 NOTE — Anesthesia Postprocedure Evaluation (Signed)
 Anesthesia Post Note  Patient: Savannah Hill  Procedure(s) Performed: ATRIAL FIBRILLATION ABLATION     Patient location during evaluation: PACU Anesthesia Type: General Level of consciousness: awake and alert Pain management: pain level controlled Vital Signs Assessment: post-procedure vital signs reviewed and stable Respiratory status: spontaneous breathing, nonlabored ventilation and respiratory function stable Cardiovascular status: blood pressure returned to baseline and stable Postop Assessment: no apparent nausea or vomiting Anesthetic complications: no   There were no known notable events for this encounter.  Last Vitals:  Vitals:   12/27/23 0940 12/27/23 0945  BP: (!) 152/83 (!) 151/83  Pulse: 64 63  Resp: 15 13  Temp:  37 C  SpO2: 100% 97%    Last Pain:  Vitals:   12/27/23 1025  TempSrc:   PainSc: 0-No pain                 Butler Levander Pinal

## 2023-12-27 NOTE — Anesthesia Procedure Notes (Signed)
 Procedure Name: Intubation Date/Time: 12/27/2023 7:36 AM  Performed by: Obadiah Reyes BROCKS, CRNAPre-anesthesia Checklist: Patient identified, Emergency Drugs available, Suction available and Patient being monitored Patient Re-evaluated:Patient Re-evaluated prior to induction Oxygen Delivery Method: Circle System Utilized Preoxygenation: Pre-oxygenation with 100% oxygen Induction Type: IV induction Ventilation: Mask ventilation without difficulty Laryngoscope Size: Miller and 2 Grade View: Grade I Tube type: Oral Number of attempts: 1 Airway Equipment and Method: Stylet and Oral airway Placement Confirmation: ETT inserted through vocal cords under direct vision, positive ETCO2 and breath sounds checked- equal and bilateral Secured at: 20 cm Tube secured with: Tape Dental Injury: Teeth and Oropharynx as per pre-operative assessment

## 2023-12-27 NOTE — Discharge Instructions (Signed)

## 2023-12-27 NOTE — Transfer of Care (Signed)
 Immediate Anesthesia Transfer of Care Note  Patient: Savannah Hill  Procedure(s) Performed: ATRIAL FIBRILLATION ABLATION  Patient Location: PACU  Anesthesia Type:General  Level of Consciousness: awake, alert , and oriented  Airway & Oxygen Therapy: Patient Spontanous Breathing and Patient connected to nasal cannula oxygen  Post-op Assessment: Report given to RN and Post -op Vital signs reviewed and stable  Post vital signs: Reviewed and stable  Last Vitals:  Vitals Value Taken Time  BP    Temp    Pulse 63 12/27/23 09:13  Resp 20 12/27/23 09:13  SpO2 100 % 12/27/23 09:13  Vitals shown include unfiled device data.  Last Pain:  Vitals:   12/27/23 0619  TempSrc: Oral         Complications: There were no known notable events for this encounter.

## 2023-12-27 NOTE — H&P (Signed)
  Electrophysiology Office Note:   Date:  12/27/2023  ID:  Savannah Hill, DOB 1950/08/22, MRN [id number]  Primary Cardiologist: Ting Cage Gladis Norton, MD Primary Heart Failure: None Electrophysiologist: Saide Lanuza Gladis Norton, MD      History of Present Illness:   Savannah Hill is a 73 y.o. female with h/o atrial fibrillation, hypertension, PVCs, CVA seen today for routine electrophysiology followup.   Today, denies symptoms of palpitations, chest pain, dyspnea, orthopnea, PND, lower extremity edema, claudication, dizziness, presyncope, syncope, bleeding, or neurologic sequela. The patient is tolerating medications without difficulties. Plan ablation today.   EP Information / Studies Reviewed:    EKG is ordered today. Personal review as below.        Risk Assessment/Calculations:    CHA2DS2-VASc Score =     This indicates a  % annual risk of stroke. The patient's score is based upon:              Physical Exam:   VS:  BP (!) 153/87   Pulse 60   Temp 98.2 F (36.8 C) (Oral)   Resp 17   Ht 5' 3 (1.6 m)   Wt 68 kg   LMP 03/19/1984   SpO2 97%   BMI 26.57 kg/m    Wt Readings from Last 3 Encounters:  12/27/23 68 kg  10/16/23 71.2 kg  08/26/23 69 kg    GEN: Well nourished, well developed in no acute distress NECK: No JVD; No carotid bruits CARDIAC: Regular rate and rhythm, no murmurs, rubs, gallops RESPIRATORY:  Clear to auscultation without rales, wheezing or rhonchi  ABDOMEN: Soft, non-tender, non-distended EXTREMITIES:  No edema; No deformity    ASSESSMENT AND PLAN:    1.  Paroxysmal atrial fibrillation: Savannah Hill has presented today for surgery, with the diagnosis of AF.  The various methods of treatment have been discussed with the patient and family. After consideration of risks, benefits and other options for treatment, the patient has consented to  Procedure(s): Catheter ablation as a surgical intervention .  Risks include but not limited to  complete heart block, stroke, esophageal damage, nerve damage, bleeding, vascular damage, tamponade, perforation, MI, and death. The patient's history has been reviewed, patient examined, no change in status, stable for surgery.  I have reviewed the patient's chart and labs.  Questions were answered to the patient's satisfaction.    Emillee Talsma Norton, MD 12/27/2023 7:08 AM

## 2023-12-29 ENCOUNTER — Encounter (HOSPITAL_COMMUNITY): Payer: Self-pay | Admitting: Cardiology

## 2023-12-29 ENCOUNTER — Other Ambulatory Visit: Payer: Self-pay | Admitting: Family Medicine

## 2023-12-29 DIAGNOSIS — I890 Lymphedema, not elsewhere classified: Secondary | ICD-10-CM

## 2023-12-30 ENCOUNTER — Telehealth (HOSPITAL_COMMUNITY): Payer: Self-pay

## 2023-12-30 ENCOUNTER — Ambulatory Visit

## 2023-12-30 DIAGNOSIS — C531 Malignant neoplasm of exocervix: Secondary | ICD-10-CM

## 2023-12-30 DIAGNOSIS — Z90710 Acquired absence of both cervix and uterus: Secondary | ICD-10-CM

## 2023-12-30 DIAGNOSIS — I890 Lymphedema, not elsewhere classified: Secondary | ICD-10-CM

## 2023-12-30 LAB — POCT ACTIVATED CLOTTING TIME: Activated Clotting Time: 302 s

## 2023-12-30 NOTE — Telephone Encounter (Signed)
 Attempted to reach patient to follow up with procedure completed on 12/27/23, no answer. Left VM for patient to return call.

## 2023-12-30 NOTE — Telephone Encounter (Signed)
 Please schedule yearly visit with fasting labs prior for Dr. Avelina after 01/20/24.

## 2023-12-30 NOTE — Telephone Encounter (Signed)
 Spoke with patient to complete post procedure follow up call.  Patient reports no complications with groin sites.   Instructions reviewed with patient:  It is normal to have bruising, tenderness, mild swelling, and a pea or marble sized lump/knot at the groin site which can take up to three months to resolve.  Get help right away if you notice sudden swelling at the puncture site.  Check your puncture site every day for signs of infection: fever, redness, swelling, pus drainage, warmth, foul odor or excessive pain. If this occurs, please call [phone], to speak with the RN Navigator. Get help right away if your puncture site is bleeding and the bleeding does not stop after applying firm pressure to the area.  You may continue to have skipped beats/ atrial fibrillation during the first several months after your procedure.  It is very important not to miss any doses of your blood thinner Eliquis .    You will follow up with the Afib clinic 4 weeks after your procedure and follow up with the Afib clinic 3 months after your procedure.   Patient verbalized understanding to all instructions provided.

## 2023-12-30 NOTE — Therapy (Signed)
 OUTPATIENT PHYSICAL THERAPY  LOWER EXTREMITY ONCOLOGY TREATMENT  Patient Name: Savannah Hill MRN: [id number] DOB:11-30-1950, 73 y.o., female Today's Date: 12/30/2023  END OF SESSION:  PT End of Session - 12/30/23 1059     Visit Number 8   unchanged due to no treatment today   Number of Visits 24    Date for Recertification  01/15/24    PT Start Time 1100    PT Stop Time 1120    PT Time Calculation (min) 20 min    Activity Tolerance Patient tolerated treatment well    Behavior During Therapy [facility] for tasks assessed/performed          Past Medical History:  Diagnosis Date   Anemia    borderline   Atrial fibrillation (HCC)    a. s/p ablation on 10/05/2016   Cancer (HCC)    cervical/rad hysterectomy/bso wiht chemo for small cell ca   Dyspnea    Heart valve disorder    HLD (hyperlipidemia)    Hypertension    Joint pain    Lower extremity edema    Obesity    Palpitations    Past Surgical History:  Procedure Laterality Date   ABLATION OF DYSRHYTHMIC FOCUS  10/05/2016   ATRIAL FIBRILLATION ABLATION N/A 10/05/2016   Procedure: Atrial Fibrillation Ablation;  Surgeon: Inocencio Soyla Lunger, MD;  Location: [facility] INVASIVE CV LAB;  Service: Cardiovascular;  Laterality: N/A;   ATRIAL FIBRILLATION ABLATION N/A 12/27/2023   Procedure: ATRIAL FIBRILLATION ABLATION;  Surgeon: Inocencio Soyla Lunger, MD;  Location: [facility] INVASIVE CV LAB;  Service: Cardiovascular;  Laterality: N/A;   IR RADIOLOGY PERIPHERAL GUIDED IV START  09/28/2016   IR US  GUIDE VASC ACCESS RIGHT  09/28/2016   RADICAL ABDOMINAL HYSTERECTOMY  1986   with BSO   REPLACEMENT TOTAL KNEE Right 2022   TEE WITHOUT CARDIOVERSION N/A 06/07/2016   Procedure: TRANSESOPHAGEAL ECHOCARDIOGRAM (TEE);  Surgeon: Jerel Balding, MD;  Location: [facility] ENDOSCOPY;  Service: Cardiovascular;  Laterality: N/A;   Patient Active Problem List   Diagnosis Date Noted   History of CVA (cerebrovascular accident) 05/28/2021   Hyperlipidemia  06/17/2019   Class 1 obesity due to excess calories with serious comorbidity and body mass index (BMI) of 32.0 to 32.9 in adult 12/05/2018   Postmenopausal 12/05/2018   Family history of diabetes mellitus in father 12/05/2018   Impaired fasting glucose 12/05/2018   Small cell carcinoma (HCC)- of the cervix 12/05/2018   Hip pain, bilateral 05/15/2018   Essential hypertension 01/29/2018   Chronic insomnia 01/29/2018   MDD (major depressive disorder), single episode 01/29/2018   Excessive drinking of alcohol  01/29/2018   Chronic anticoagulation 10/06/2016   Mitral valve disease    PAF (paroxysmal atrial fibrillation) (HCC) 07/01/2013   LAFB (left anterior fascicular block) 07/01/2013   Lymphedema 07/01/2013   h/o Cervical cancer 10/01/2011   H/O total hysterectomy 10/01/2011    PCP:   REFERRING PROVIDER: Ronal Pinal, MD  REFERRING DIAG: Right LE Lymphedema  THERAPY DIAG:  Malignant neoplasm of exocervix (HCC)  Lymphedema, not elsewhere classified  S/P total hysterectomy  ONSET DATE: April 2025 exacerbation  Rationale for Evaluation and Treatment: Rehabilitation  SUBJECTIVE:  SUBJECTIVE STATEMENT:   My ablation went well. They went into both groins. I can't drive until Wed, and can't lift for a week. I have precautions through Friday.    EVAL I have not been wearing garments but I have been using the pump every night. I am not using the Flexi Touch because It doesn't fit properly since I lost 30 lbs and I don't know how to adjust it. I have been using a basic pump and it does OK and my leg reduces but by the next day swelling returns. She has an Ablation for A Fib October 10. In 2 weeks we go to the [facility]  for 10 days. Pt has old compression stockings, a Flexi touch and another basic style  pump, and a Sigvaris night garment.   PERTINENT HISTORY:  radical hysterectomy in 1986 for small cell cervical endocrine cancer with 40 pelvic lymph nodes removed with chemo, no radiation. She developed some swelling after the procedure but has noticed and increase as she has gotten older and with some weight gain. She has a totat knee June 2022 and has had an increase in lymphedema since. She has an extremity pump from [employer] that she got in June 2022 and a Flexi touch that she received in 2023.[name] She has been treated here from 02/24/2021-06/2021 PAIN:  Are you having pain? No  PRECAUTIONS: Right LE lymphedema, prior CVA,Anemia, cervical CA, HLD, HTN, A-Fib, s/p TKA   RED FLAGS: None   WEIGHT BEARING RESTRICTIONS: No  FALLS:  Has patient fallen in last 6 months? No  LIVING ENVIRONMENT: Lives with: lives with their spouse Lives in: House/apartment Stairs: No;    OCCUPATION: Retired  LEISURE: read, cross word puzzles  PRIOR LEVEL OF FUNCTION: Independent  PATIENT GOALS: Decrease swelling right LE   OBJECTIVE: Note: Objective measures were completed at Evaluation unless otherwise noted.  COGNITION: Overall cognitive status: Within functional limits for tasks assessed   PALPATION: No pitting edema  OBSERVATIONS / OTHER ASSESSMENTS: significant right LE swelling, non pitting, but relatively soft  SENSATION: Light touch: Deficits     POSTURE: Round shoulders, forward head  LYMPHEDEMA ASSESSMENTS:   SURGERY TYPE/DATE: 03/19/1984  NUMBER OF LYMPH NODES REMOVED: 40  CHEMOTHERAPY: yes  RADIATION:  HORMONE TREATMENT: no  INFECTIONS:   LYMPHEDEMA ASSESSMENTS:   LOWER EXTREMITY LANDMARK RIGHT eval RIGHT 11/25/2023 RIGHT 12/02/2023 RIGHT 12/18/2023  At groin 61.9     30 cm proximal to suprapatella      20 cm proximal to suprapatella 58.2  56.3   10 cm proximal to suprapatella 51.4  49.5   At midpatella / popliteal crease 38.1     30 cm proximal to floor at lateral  plantar foot 41.9 40.5 38.8 39.7  20 cm proximal to floor at lateral plantar foot 39.2 39 35.2 37.6  10 cm proximal to floor at lateral plantar foot 32.7 31.4 30.0 32.3  Circumference of ankle/heel      5 cm proximal to 1st MTP joint 21.7     Across MTP joint 21.9     Around proximal great toe 8.0  7.5 7.7  (Blank rows = not tested)  LOWER EXTREMITY LANDMARK LEFT eval  At groin 59.5  30 cm proximal to suprapatella   20 cm proximal to suprapatella 56.5  10 cm proximal to suprapatella 45.2  At midpatella / popliteal crease 34.9  30 cm proximal to floor at lateral plantar foot 37.9  20 cm proximal to floor at lateral plantar  foot 30.6  10 cm proximal to floor at lateral plantar foot 22.8  Circumference of ankle/heel   5 cm proximal to 1st MTP joint 20.9  Across MTP joint 20.5  Around proximal great toe 7.3  (Blank rows = not tested)  FUNCTIONAL TESTS:    GAIT:WFL   Outcome measure:LLIS: 22%                                                                                                                            TREATMENT DATE:  12/30/2023 After discussion with Eward Sharps PT supervisor it was determined we should not treat pt for the remainder of this week due to possibility of increased pressure causing arterial bleeding. Cancelled appts this week, and pt will resume next week.   12/24/2023 MLD to Rt LE: Short neck, superficial and deep abdominals, Rt axillary nodes, Rt ingiuno-axillary anastomosis, then Rt LE as follows: lateral thigh, medial to latera, 4 techniques at knee, ant and post lower leg, ankle, retro malleoli, dorsal foot/toes then retraced all steps.   Compression Bandaging to Rt LE as follows: Cocoa butter, Medium TG soft foot to thigh, Small amt of artiflex to foot and ankle and then at pop fossa, then Comprilan soft 10 cm ankle to knee, and 15 cm  comprilan knee to thigh.  Wrapped 1st 3 toes with mollelast due to swelling, 6 cm to foot Roman sandal, then 8 cm ASH  pattern. 12 cm ankle to knee, then 12 cm below knee to mid thigh, and additional 12 cm knee to groin, then last 12 cm mid shin to groin. Checked capillary refill and was good   12/18/2023 Measured lower leg; increased throughout secondary to pt not wearing stocking for 5/10 days while away on her trip MLD to Rt LE: Short neck, superficial and deep abdominals, Rt axillary nodes, Rt ingiuno-axillary anastomosis, then Rt LE as follows: lateral thigh, medial to latera, 4 techniques at knee, ant and post lower leg, ankle, retro malleoli, dorsal foot/toes then retraced all steps.   Compression Bandaging to Rt LE as follows: Cocoa butter, Medium TG soft foot to thigh, Small amt of artiflex to foot and ankle and then at pop fossa, then Comprilan soft 10 cm ankle to knee, and 15 cm  comprilan knee to thigh.  Wrapped 1st 3 toes with mollelast due to swelling, 6 cm to foot Roman sandal, then 8 cm ASH pattern. 12 cm ankle to knee, then 12 cm below knee to mid thigh, and additional 12 cm knee to groin, then last 12 cm mid shin to groin. Checked capillary refill and was good  12/05/2023 Manual Therapy MLD to Rt LE: Short neck, superficial and deep abdominals, Rt axillary nodes, Rt ingiuno-axillary anastomosis, then Rt LE as follows: lateral thigh, medial to latera, 4 techniques at knee, ant and post lower leg, ankle, retro malleoli, dorsal foot/toes then retraced all steps.   Compression Bandaging to Rt LE as follows: Cocoa butter, Medium TG  soft foot to thigh, Small amt of artiflex to foot and ankle and then at pop fossa, then Comprilan soft 10 cm ankle to knee, and 15 cm  comprilan knee to thigh.  Wrapped 1st 3 toes with mollelast due to swelling, 6 cm to foot Roman sandal, then 8 cm ASH pattern. 12 cm ankle to knee, then 12 cm below knee to mid thigh, and additional 12 cm knee to groin, then last 12 cm mid shin to groin. Checked capillary refill and was good.   12/02/2023 Pt measured with excellent results despite  being in stocking today Manual Therapy MLD to Rt LE: Short neck, superficial and deep abdominals, Rt axillary nodes, Rt ingiuno-axillary anastomosis, then Rt LE as follows: lateral thigh, medial to latera, 4 techniques at knee, ant and post lower leg, ankle, retro malleoli, dorsal foot/toes then retraced all steps.   Compression Bandaging to Rt LE as follows: Cocoa butter, Medium TG soft foot to thigh, Small amt of artiflex to foot and ankle and then at pop fossa, then Comprilan soft 10 cm ankle to knee, and 15 cm  comprilan knee to thigh.  Wrapped 1st 3 toes with mollelast due to swelling, 6 cm to foot Roman sandal, then 8 cm ASH pattern. 12 cm ankle to knee, then 12 cm below knee to mid thigh, and additional 12 cm knee to groin, then last 12 cm mid shin to groin. Checked capillary refill and was good.     11/29/2023 Manual Therapy MLD to Rt LE: Short neck, superficial and deep abdominals, Rt axillary nodes, Rt ingiuno-axillary anastomosis, then Rt LE as follows: lateral thigh, medial to latera, 4 techniques at knee, ant and post lower leg, ankle, retro malleoli, dorsal foot/toes then retraced all steps.   Compression Bandaging to Rt LE as follows: Cocoa butter, Medium TG soft foot to thigh, Small amt of artiflex to foot and ankle and then at pop fossa, then Comprilan soft 10 cm ankle to knee, and 15 cm  comprilan knee to thigh.  Wrapped 1st 3 toes with mollelast due to swelling, 6 cm to foot Roman sandal, then 8 cm ASH pattern. 12 cm ankle to knee, then 12 cm below knee to mid thigh, and additional 12 cm knee to groin, then last 12 cm mid shin to groin. Checked capillary refill and was good.    11/27/23: Manual Therapy MLD to Rt LE: Short neck, superficial and deep abdominals, Rt axillary nodes, Rt ingiuno-axillary anastomosis, then Rt LE as follows: lateral thigh, medial to latera, 4 techniques at knee, ant and post lower leg, ankle, retro malleoli, dorsal foot/toes then retraced all steps. While  performing instructed pt in importance of her continuing self MLD at home. But when bandages on only needs to focus on pretreat, axillary nodes and anastomosis as done today. Reviewed correct technique and directionality.  Compression Bandaging to Rt LE as follows: Cocoa butter, Medium TG soft foot to thigh, Small amt of artiflex to foot and ankle and then at pop fossa, then Comprilan soft 10 cm ankle to knee, and 15 cm  comprilan knee to thigh.  6 cm to foot Roman sandal, then 8 cm ASH pattern. 12 cm ankle to knee, then 12 cm below knee to mid thigh, and additional 12 cm knee to groin, then last 12 cm mid shin to groin. Pts husband did the 6 cm and cuing offered for reinforcement throughout, mostly not to pull bandage but instead offer constant tension throughout. Checked capillary refill and was  good. Assisted pt with donning on her velcro sandals. Self Care Instructed pts husband when he was applying 6 cm bandage, see above. Then at end of session explained difference between Active phase of CDT treatment, which we are doing now, then we work to transition to the Maintenance Phase with her new flat knit garment and then they don't have to cont bandaging IF she remains compliant and consistent with daily wear of stocking.   11/25/2023 Measured lower leg with some reduction since wearing stocking Pts husband instructed in compression bandaging . Instructed first by therapist and then pts husband demonstrated. Therapist applied Medium TG soft foot to thigh, Small amt of artiflex to foot and ankle, then Comprilan soft 10 cm ankle to knee, and 15 cm  comprilan knee to thigh. 6 cm to foot with figure 8's, then 8 cm with heel locks and circles up leg. 12 cm mid leg to knee, then 12 cm below knee to mid thigh, and additional 12 cm knee to groin. Pts husband videotaped therapist wrapping, and also performed 6 cm and 8 cm wrap 2 x's each and observed therapist do the remainder due to time. Checked capillary refill  and was good. Pt put on her velcro sandals. Left out toe wraps at pt requuest.  11/20/2023   PATIENT EDUCATION:  Education details: POC, LOS, TREATMENT interventions; Twyla Mort name to call re: adjusting Flexi, Discussed compression stockings maintain, vs bandaging reduces and is very impt part of CDT in addition to MLD or pump. Person educated: Patient and Spouse Education method: Explanation Education comprehension: verbalized understanding  HOME EXERCISE PROGRAM:   ASSESSMENT:  CLINICAL IMPRESSION:  Held therapy this week and pt will not wear compression garment either. Advised to keep legs elevated as much as possible this week. Resume bandaging next week  EVAL Patient is a 73 y.o. female who was seen today for physical therapy evaluation and treatment for progression of her right  LE lymphedema. . She has lymphostatic lymphedema with congestion and hyperkeratosis. She has been treated for Right LE lymphedema most recently 2 years ago and has a Flexi touch that no longer fits properly due to 30 lb wt. Loss.  Her husband was previously independent in compression bandaging, and is willing to help his wife but will need review. We discussed importance of compression to reduce her leg as she questioned why her leg continues to swell despite using a pump..She has compression stockings but has not been wearing them. She will benefit from skilled therapy to address deficits and return pt to a more fxl lifestyle.  OBJECTIVE IMPAIRMENTS: decreased activity tolerance, decreased knowledge of condition, increased edema, and postural dysfunction.   ACTIVITY LIMITATIONS: locomotion level  PARTICIPATION LIMITATIONS: no restrictions  PERSONAL FACTORS: chronic lymphedema, abdominal surgery for cervical cancer removal with 40 lymph nodes removed, right total knee in June 2022.  are also affecting patient's functional outcome.   REHAB POTENTIAL: Good  CLINICAL DECISION MAKING:  Stable/uncomplicated  EVALUATION COMPLEXITY: Low   GOALS: Goals reviewed with patient? Yes  SHORT TERM GOALS: Target date: 12/18/2023 // Pt/ husband will be able to do self compression bandaging  Baseline: Goal status: INITIAL  2.  Pts husband will perform MLD or may use compression pump with MLD prn Baseline:  Goal status: INITIAL  3.  Pt will reduce at 10 cm prox to suprapatella by 1.5 cm Baseline:  Goal status: INITIAL  4.  Pt will reduce at 20 cm prox to floor by 2 cm Baseline:  Goal status: MET  5.  Pt will call flexi touch and will have garment refit due to her wt. loss Baseline:  Goal status: INITIAL  LONG TERM GOALS: Target date: 01/15/2024  Pt will have appropriate day  time garments to control lymphedema Baseline:  Goal status: INITIAL  2.  Pt reports she knows how to control her lymphedema at home with lymph drainage manually or with pump, compression and exercise  Baseline:  Goal status: INITIAL  3.  Pt pt will reduce at 10 cm prox to patella by 3 cm to demonstrate improved swelling Baseline:  Goal status: INITIAL  4.  Pt will reduce at 20 cm prox to floor by 4.5 cmt o demonstrate improved swelling Baseline:  Goal status: INITIAL  PLAN:  PT FREQUENCY: 3x/week  PT DURATION: 8 weeks  PLANNED INTERVENTIONS: [id number]- PT Re-evaluation, 97110-Therapeutic exercises, 97535- Self Care, [id number]- Manual therapy, 97760- Orthotic Initial, [id number]- Orthotic/Prosthetic subsequent, Manual lymph drainage, and Compression bandaging  PLAN FOR NEXT SESSION: Cont to instruct pts husband in wrapping,  out of town in 10 days, MLD, measure weekly. Measure for garment on return if leg looks good.   Grayce JINNY Sheldon, PT 12/30/2023, 11:48 AM

## 2023-12-31 MED FILL — Midazolam HCl Inj PF 2 MG/2ML (Base Equivalent): INTRAMUSCULAR | Qty: 2 | Status: AC

## 2024-01-01 ENCOUNTER — Encounter

## 2024-01-03 ENCOUNTER — Encounter

## 2024-01-06 ENCOUNTER — Telehealth: Payer: Self-pay | Admitting: Cardiology

## 2024-01-06 ENCOUNTER — Ambulatory Visit: Payer: Self-pay

## 2024-01-06 DIAGNOSIS — Z90710 Acquired absence of both cervix and uterus: Secondary | ICD-10-CM

## 2024-01-06 DIAGNOSIS — I890 Lymphedema, not elsewhere classified: Secondary | ICD-10-CM | POA: Diagnosis not present

## 2024-01-06 DIAGNOSIS — C531 Malignant neoplasm of exocervix: Secondary | ICD-10-CM | POA: Diagnosis not present

## 2024-01-06 NOTE — Telephone Encounter (Signed)
 Looks like patient had ablation on 10/10. I do not see a communication concerning legs being wrapped. Spoke with [place], [place] and patient advised that wrapping should be okay as long as site is healing. Pt admits that groin looks good on the leg that needs to be wrapped, other groin site is just bruised. Pt advised to monitor groin site for oozing at site. Pt agrees with plan of care.

## 2024-01-06 NOTE — Therapy (Signed)
 OUTPATIENT PHYSICAL THERAPY  LOWER EXTREMITY ONCOLOGY TREATMENT  Patient Name: Savannah Hill MRN: [id number] DOB:[birth year], 73 y.o., female Today's Date: 01/06/2024  END OF SESSION:  PT End of Session - 01/06/24 1103     Visit Number 9    Number of Visits 24    Date for Recertification  01/15/24    PT Start Time 1104    PT Stop Time 1157    PT Time Calculation (min) 53 min    Activity Tolerance Patient tolerated treatment well    Behavior During Therapy [facility] for tasks assessed/performed          Past Medical History:  Diagnosis Date   Anemia    borderline   Atrial fibrillation (HCC)    a. s/p ablation on 10/05/2016   Cancer (HCC)    cervical/rad hysterectomy/bso wiht chemo for small cell ca   Dyspnea    Heart valve disorder    HLD (hyperlipidemia)    Hypertension    Joint pain    Lower extremity edema    Obesity    Palpitations    Past Surgical History:  Procedure Laterality Date   ABLATION OF DYSRHYTHMIC FOCUS  10/05/2016   ATRIAL FIBRILLATION ABLATION N/A 10/05/2016   Procedure: Atrial Fibrillation Ablation;  Surgeon: Inocencio Soyla Lunger, MD;  Location: [facility] INVASIVE CV LAB;  Service: Cardiovascular;  Laterality: N/A;   ATRIAL FIBRILLATION ABLATION N/A 12/27/2023   Procedure: ATRIAL FIBRILLATION ABLATION;  Surgeon: Inocencio Soyla Lunger, MD;  Location: [facility] INVASIVE CV LAB;  Service: Cardiovascular;  Laterality: N/A;   IR RADIOLOGY PERIPHERAL GUIDED IV START  09/28/2016   IR US  GUIDE VASC ACCESS RIGHT  09/28/2016   RADICAL ABDOMINAL HYSTERECTOMY  1986   with BSO   REPLACEMENT TOTAL KNEE Right 2022   TEE WITHOUT CARDIOVERSION N/A 06/07/2016   Procedure: TRANSESOPHAGEAL ECHOCARDIOGRAM (TEE);  Surgeon: Jerel Balding, MD;  Location: [facility] ENDOSCOPY;  Service: Cardiovascular;  Laterality: N/A;   Patient Active Problem List   Diagnosis Date Noted   History of CVA (cerebrovascular accident) 05/28/2021   Hyperlipidemia 06/17/2019   Class 1 obesity due to excess  calories with serious comorbidity and body mass index (BMI) of 32.0 to 32.9 in adult 12/05/2018   Postmenopausal 12/05/2018   Family history of diabetes mellitus in father 12/05/2018   Impaired fasting glucose 12/05/2018   Small cell carcinoma (HCC)- of the cervix 12/05/2018   Hip pain, bilateral 05/15/2018   Essential hypertension 01/29/2018   Chronic insomnia 01/29/2018   MDD (major depressive disorder), single episode 01/29/2018   Excessive drinking of alcohol  01/29/2018   Chronic anticoagulation 10/06/2016   Mitral valve disease    PAF (paroxysmal atrial fibrillation) (HCC) 07/01/2013   LAFB (left anterior fascicular block) 07/01/2013   Lymphedema 07/01/2013   h/o Cervical cancer 10/01/2011   H/O total hysterectomy 10/01/2011    PCP:   REFERRING PROVIDER: Ronal Pinal, MD  REFERRING DIAG: Right LE Lymphedema  THERAPY DIAG:  Malignant neoplasm of exocervix (HCC)  Lymphedema, not elsewhere classified  S/P total hysterectomy  ONSET DATE: April 2025 exacerbation  Rationale for Evaluation and Treatment: Rehabilitation  SUBJECTIVE:  SUBJECTIVE STATEMENT:   I called the Drs. Office today. They said it should be OK after the ablation to do now, but to let them know if any drainage or bleeding.    EVAL I have not been wearing garments but I have been using the pump every night. I am not using the Flexi Touch because It doesn't fit properly since I lost 30 lbs and I don't know how to adjust it. I have been using a basic pump and it does OK and my leg reduces but by the next day swelling returns. She has an Ablation for A Fib October 10. In 2 weeks we go to the [facility]  for 10 days. Pt has old compression stockings, a Flexi touch and another basic style pump, and a Sigvaris night  garment.   PERTINENT HISTORY:  radical hysterectomy in 1986 for small cell cervical endocrine cancer with 40 pelvic lymph nodes removed with chemo, no radiation. She developed some swelling after the procedure but has noticed and increase as she has gotten older and with some weight gain. She has a totat knee June 2022 and has had an increase in lymphedema since. She has an extremity pump from [employer] that she got in June 2022 and a Flexi touch that she received in 2023.[name] She has been treated here from 02/24/2021-06/2021 PAIN:  Are you having pain? No, occasionally if I move unexpectedly  PRECAUTIONS: Right LE lymphedema, prior CVA,Anemia, cervical CA, HLD, HTN, A-Fib, s/p TKA   RED FLAGS: None   WEIGHT BEARING RESTRICTIONS: No  FALLS:  Has patient fallen in last 6 months? No  LIVING ENVIRONMENT: Lives with: lives with their spouse Lives in: House/apartment Stairs: No;    OCCUPATION: Retired  LEISURE: read, cross word puzzles  PRIOR LEVEL OF FUNCTION: Independent  PATIENT GOALS: Decrease swelling right LE   OBJECTIVE: Note: Objective measures were completed at Evaluation unless otherwise noted.  COGNITION: Overall cognitive status: Within functional limits for tasks assessed   PALPATION: No pitting edema  OBSERVATIONS / OTHER ASSESSMENTS: significant right LE swelling, non pitting, but relatively soft  SENSATION: Light touch: Deficits     POSTURE: Round shoulders, forward head  LYMPHEDEMA ASSESSMENTS:   SURGERY TYPE/DATE: 03/19/1984  NUMBER OF LYMPH NODES REMOVED: 40  CHEMOTHERAPY: yes  RADIATION:  HORMONE TREATMENT: no  INFECTIONS:   LYMPHEDEMA ASSESSMENTS:   LOWER EXTREMITY LANDMARK RIGHT eval RIGHT 11/25/2023 RIGHT 12/02/2023 RIGHT 12/18/2023  At groin 61.9     30 cm proximal to suprapatella      20 cm proximal to suprapatella 58.2  56.3   10 cm proximal to suprapatella 51.4  49.5   At midpatella / popliteal crease 38.1     30 cm proximal to floor  at lateral plantar foot 41.9 40.5 38.8 39.7  20 cm proximal to floor at lateral plantar foot 39.2 39 35.2 37.6  10 cm proximal to floor at lateral plantar foot 32.7 31.4 30.0 32.3  Circumference of ankle/heel      5 cm proximal to 1st MTP joint 21.7     Across MTP joint 21.9     Around proximal great toe 8.0  7.5 7.7  (Blank rows = not tested)  LOWER EXTREMITY LANDMARK LEFT eval  At groin 59.5  30 cm proximal to suprapatella   20 cm proximal to suprapatella 56.5  10 cm proximal to suprapatella 45.2  At midpatella / popliteal crease 34.9  30 cm proximal to floor at lateral plantar foot 37.9  20 cm proximal to floor at lateral plantar foot 30.6  10 cm proximal to floor at lateral plantar foot 22.8  Circumference of ankle/heel   5 cm proximal to 1st MTP joint 20.9  Across MTP joint 20.5  Around proximal great toe 7.3  (Blank rows = not tested)  FUNCTIONAL TESTS:    GAIT:WFL   Outcome measure:LLIS: 22%                                                                                                                            TREATMENT DATE:  01/06/2024 Pts leg much more swollen today after going a week without compression due to ablation MLD to Rt LE: Short neck, superficial and deep abdominals, Rt axillary nodes, Rt ingiuno-axillary anastomosis, then Rt LE as follows: lateral thigh, medial to latera, 4 techniques at knee, ant and post lower leg, ankle, retro malleoli, dorsal foot/toes then retraced all steps.   Compression Bandaging to Rt LE as follows: Cocoa butter, Medium TG soft foot to thigh, Small amt of artiflex to foot and ankle and then at pop fossa, then Comprilan soft 10 cm ankle to knee, and 15 cm  comprilan knee to thigh.  Wrapped 1st 3 toes with mollelast due to swelling, 6 cm to foot Roman sandal, then 8 cm ASH pattern. 12 cm ankle to knee, then 12 cm below knee to mid thigh, and additional 12 cm knee to groin, then last 12 cm mid shin to groin. Checked capillary refill  and was good   12/30/2023 After discussion with Eward Sharps PT supervisor it was determined we should not treat pt for the remainder of this week due to possibility of increased pressure causing arterial bleeding. Cancelled appts this week, and pt will resume next week.   12/24/2023 MLD to Rt LE: Short neck, superficial and deep abdominals, Rt axillary nodes, Rt ingiuno-axillary anastomosis, then Rt LE as follows: lateral thigh, medial to latera, 4 techniques at knee, ant and post lower leg, ankle, retro malleoli, dorsal foot/toes then retraced all steps.   Compression Bandaging to Rt LE as follows: Cocoa butter, Medium TG soft foot to thigh, Small amt of artiflex to foot and ankle and then at pop fossa, then Comprilan soft 10 cm ankle to knee, and 15 cm  comprilan knee to thigh.  Wrapped 1st 3 toes with mollelast due to swelling, 6 cm to foot Roman sandal, then 8 cm ASH pattern. 12 cm ankle to knee, then 12 cm below knee to mid thigh, and additional 12 cm knee to groin, then last 12 cm mid shin to groin. Checked capillary refill and was good   12/18/2023 Measured lower leg; increased throughout secondary to pt not wearing stocking for 5/10 days while away on her trip MLD to Rt LE: Short neck, superficial and deep abdominals, Rt axillary nodes, Rt ingiuno-axillary anastomosis, then Rt LE as follows: lateral thigh, medial to latera, 4 techniques at knee, ant and post lower leg,  ankle, retro malleoli, dorsal foot/toes then retraced all steps.   Compression Bandaging to Rt LE as follows: Cocoa butter, Medium TG soft foot to thigh, Small amt of artiflex to foot and ankle and then at pop fossa, then Comprilan soft 10 cm ankle to knee, and 15 cm  comprilan knee to thigh.  Wrapped 1st 3 toes with mollelast due to swelling, 6 cm to foot Roman sandal, then 8 cm ASH pattern. 12 cm ankle to knee, then 12 cm below knee to mid thigh, and additional 12 cm knee to groin, then last 12 cm mid shin to groin. Checked  capillary refill and was good  12/05/2023 Manual Therapy MLD to Rt LE: Short neck, superficial and deep abdominals, Rt axillary nodes, Rt ingiuno-axillary anastomosis, then Rt LE as follows: lateral thigh, medial to latera, 4 techniques at knee, ant and post lower leg, ankle, retro malleoli, dorsal foot/toes then retraced all steps.   Compression Bandaging to Rt LE as follows: Cocoa butter, Medium TG soft foot to thigh, Small amt of artiflex to foot and ankle and then at pop fossa, then Comprilan soft 10 cm ankle to knee, and 15 cm  comprilan knee to thigh.  Wrapped 1st 3 toes with mollelast due to swelling, 6 cm to foot Roman sandal, then 8 cm ASH pattern. 12 cm ankle to knee, then 12 cm below knee to mid thigh, and additional 12 cm knee to groin, then last 12 cm mid shin to groin. Checked capillary refill and was good.   12/02/2023 Pt measured with excellent results despite being in stocking today Manual Therapy MLD to Rt LE: Short neck, superficial and deep abdominals, Rt axillary nodes, Rt ingiuno-axillary anastomosis, then Rt LE as follows: lateral thigh, medial to latera, 4 techniques at knee, ant and post lower leg, ankle, retro malleoli, dorsal foot/toes then retraced all steps.   Compression Bandaging to Rt LE as follows: Cocoa butter, Medium TG soft foot to thigh, Small amt of artiflex to foot and ankle and then at pop fossa, then Comprilan soft 10 cm ankle to knee, and 15 cm  comprilan knee to thigh.  Wrapped 1st 3 toes with mollelast due to swelling, 6 cm to foot Roman sandal, then 8 cm ASH pattern. 12 cm ankle to knee, then 12 cm below knee to mid thigh, and additional 12 cm knee to groin, then last 12 cm mid shin to groin. Checked capillary refill and was good.     11/29/2023 Manual Therapy MLD to Rt LE: Short neck, superficial and deep abdominals, Rt axillary nodes, Rt ingiuno-axillary anastomosis, then Rt LE as follows: lateral thigh, medial to latera, 4 techniques at knee, ant and post  lower leg, ankle, retro malleoli, dorsal foot/toes then retraced all steps.   Compression Bandaging to Rt LE as follows: Cocoa butter, Medium TG soft foot to thigh, Small amt of artiflex to foot and ankle and then at pop fossa, then Comprilan soft 10 cm ankle to knee, and 15 cm  comprilan knee to thigh.  Wrapped 1st 3 toes with mollelast due to swelling, 6 cm to foot Roman sandal, then 8 cm ASH pattern. 12 cm ankle to knee, then 12 cm below knee to mid thigh, and additional 12 cm knee to groin, then last 12 cm mid shin to groin. Checked capillary refill and was good.    11/27/23: Manual Therapy MLD to Rt LE: Short neck, superficial and deep abdominals, Rt axillary nodes, Rt ingiuno-axillary anastomosis, then Rt LE as follows:  lateral thigh, medial to latera, 4 techniques at knee, ant and post lower leg, ankle, retro malleoli, dorsal foot/toes then retraced all steps. While performing instructed pt in importance of her continuing self MLD at home. But when bandages on only needs to focus on pretreat, axillary nodes and anastomosis as done today. Reviewed correct technique and directionality.  Compression Bandaging to Rt LE as follows: Cocoa butter, Medium TG soft foot to thigh, Small amt of artiflex to foot and ankle and then at pop fossa, then Comprilan soft 10 cm ankle to knee, and 15 cm  comprilan knee to thigh.  6 cm to foot Roman sandal, then 8 cm ASH pattern. 12 cm ankle to knee, then 12 cm below knee to mid thigh, and additional 12 cm knee to groin, then last 12 cm mid shin to groin. Pts husband did the 6 cm and cuing offered for reinforcement throughout, mostly not to pull bandage but instead offer constant tension throughout. Checked capillary refill and was good. Assisted pt with donning on her velcro sandals. Self Care Instructed pts husband when he was applying 6 cm bandage, see above. Then at end of session explained difference between Active phase of CDT treatment, which we are doing now, then  we work to transition to the Maintenance Phase with her new flat knit garment and then they don't have to cont bandaging IF she remains compliant and consistent with daily wear of stocking.   11/25/2023 Measured lower leg with some reduction since wearing stocking Pts husband instructed in compression bandaging . Instructed first by therapist and then pts husband demonstrated. Therapist applied Medium TG soft foot to thigh, Small amt of artiflex to foot and ankle, then Comprilan soft 10 cm ankle to knee, and 15 cm  comprilan knee to thigh. 6 cm to foot with figure 8's, then 8 cm with heel locks and circles up leg. 12 cm mid leg to knee, then 12 cm below knee to mid thigh, and additional 12 cm knee to groin. Pts husband videotaped therapist wrapping, and also performed 6 cm and 8 cm wrap 2 x's each and observed therapist do the remainder due to time. Checked capillary refill and was good. Pt put on her velcro sandals. Left out toe wraps at pt requuest.  11/20/2023   PATIENT EDUCATION:  Education details: POC, LOS, TREATMENT interventions; Twyla Mort name to call re: adjusting Flexi, Discussed compression stockings maintain, vs bandaging reduces and is very impt part of CDT in addition to MLD or pump. Person educated: Patient and Spouse Education method: Explanation Education comprehension: verbalized understanding  HOME EXERCISE PROGRAM:   ASSESSMENT:  CLINICAL IMPRESSION: Pts leg much more swollen today after a week without compression due to heart ablation. Resumed CDT today. Will plan on measuring for custom next week if leg is reduced enough   EVAL Patient is a 73 y.o. female who was seen today for physical therapy evaluation and treatment for progression of her right  LE lymphedema. . She has lymphostatic lymphedema with congestion and hyperkeratosis. She has been treated for Right LE lymphedema most recently 2 years ago and has a Flexi touch that no longer fits properly due to 30 lb wt.  Loss.  Her husband was previously independent in compression bandaging, and is willing to help his wife but will need review. We discussed importance of compression to reduce her leg as she questioned why her leg continues to swell despite using a pump..She has compression stockings but has not been wearing  them. She will benefit from skilled therapy to address deficits and return pt to a more fxl lifestyle.  OBJECTIVE IMPAIRMENTS: decreased activity tolerance, decreased knowledge of condition, increased edema, and postural dysfunction.   ACTIVITY LIMITATIONS: locomotion level  PARTICIPATION LIMITATIONS: no restrictions  PERSONAL FACTORS: chronic lymphedema, abdominal surgery for cervical cancer removal with 40 lymph nodes removed, right total knee in June 2022.  are also affecting patient's functional outcome.   REHAB POTENTIAL: Good  CLINICAL DECISION MAKING: Stable/uncomplicated  EVALUATION COMPLEXITY: Low   GOALS: Goals reviewed with patient? Yes  SHORT TERM GOALS: Target date: 12/18/2023 // Pt/ husband will be able to do self compression bandaging  Baseline: Goal status: INITIAL  2.  Pts husband will perform MLD or may use compression pump with MLD prn Baseline:  Goal status: INITIAL  3.  Pt will reduce at 10 cm prox to suprapatella by 1.5 cm Baseline:  Goal status: INITIAL  4.  Pt will reduce at 20 cm prox to floor by 2 cm Baseline:  Goal status: MET  5.  Pt will call flexi touch and will have garment refit due to her wt. loss Baseline:  Goal status: INITIAL  LONG TERM GOALS: Target date: 01/15/2024  Pt will have appropriate day  time garments to control lymphedema Baseline:  Goal status: INITIAL  2.  Pt reports she knows how to control her lymphedema at home with lymph drainage manually or with pump, compression and exercise  Baseline:  Goal status: INITIAL  3.  Pt pt will reduce at 10 cm prox to patella by 3 cm to demonstrate improved swelling Baseline:   Goal status: INITIAL  4.  Pt will reduce at 20 cm prox to floor by 4.5 cmt o demonstrate improved swelling Baseline:  Goal status: INITIAL  PLAN:  PT FREQUENCY: 3x/week  PT DURATION: 8 weeks  PLANNED INTERVENTIONS: [id number]- PT Re-evaluation, [id number]-Therapeutic exercises, [id number]- Self Care, [id number]- Manual therapy, 97760- Orthotic Initial, [id number]- Orthotic/Prosthetic subsequent, Manual lymph drainage, and Compression bandaging  PLAN FOR NEXT SESSION: Cont to instruct pts husband in wrapping,  out of town in 10 days, MLD, measure weekly. Measure for garment on return if leg looks good.   Grayce JINNY Sheldon, PT 01/06/2024, 11:58 AM

## 2024-01-06 NOTE — Telephone Encounter (Signed)
 Patient says she previously spoke with a nurse about having her leg wrapped. She is scheduled to have it done today at 11:00 AM and just wants to make sure it's still okay. Please advise.

## 2024-01-08 ENCOUNTER — Ambulatory Visit: Payer: Self-pay

## 2024-01-08 DIAGNOSIS — C531 Malignant neoplasm of exocervix: Secondary | ICD-10-CM

## 2024-01-08 DIAGNOSIS — Z90710 Acquired absence of both cervix and uterus: Secondary | ICD-10-CM

## 2024-01-08 DIAGNOSIS — I890 Lymphedema, not elsewhere classified: Secondary | ICD-10-CM | POA: Diagnosis not present

## 2024-01-08 NOTE — Therapy (Signed)
 OUTPATIENT PHYSICAL THERAPY  LOWER EXTREMITY ONCOLOGY TREATMENT  Patient Name: Savannah Hill MRN: [id number] DOB:1951/01/27, 73 y.o., female Today's Date: 01/08/2024  END OF SESSION:  PT End of Session - 01/08/24 1001     Visit Number 10    Number of Visits 24    Date for Recertification  01/15/24    PT Start Time 1002    PT Stop Time 1110    PT Time Calculation (min) 68 min    Activity Tolerance Patient tolerated treatment well    Behavior During Therapy [facility] for tasks assessed/performed          Past Medical History:  Diagnosis Date   Anemia    borderline   Atrial fibrillation (HCC)    a. s/p ablation on 10/05/2016   Cancer (HCC)    cervical/rad hysterectomy/bso wiht chemo for small cell ca   Dyspnea    Heart valve disorder    HLD (hyperlipidemia)    Hypertension    Joint pain    Lower extremity edema    Obesity    Palpitations    Past Surgical History:  Procedure Laterality Date   ABLATION OF DYSRHYTHMIC FOCUS  10/05/2016   ATRIAL FIBRILLATION ABLATION N/A 10/05/2016   Procedure: Atrial Fibrillation Ablation;  Surgeon: Inocencio Soyla Lunger, MD;  Location: [facility] INVASIVE CV [facility];  Service: Cardiovascular;  Laterality: N/A;   ATRIAL FIBRILLATION ABLATION N/A 12/27/2023   Procedure: ATRIAL FIBRILLATION ABLATION;  Surgeon: Inocencio Soyla Lunger, MD;  Location: [facility] INVASIVE CV [facility];  Service: Cardiovascular;  Laterality: N/A;   IR RADIOLOGY PERIPHERAL GUIDED IV START  09/28/2016   IR US  GUIDE VASC ACCESS RIGHT  09/28/2016   RADICAL ABDOMINAL HYSTERECTOMY  1986   with BSO   REPLACEMENT TOTAL KNEE Right 2022   TEE WITHOUT CARDIOVERSION N/A 06/07/2016   Procedure: TRANSESOPHAGEAL ECHOCARDIOGRAM (TEE);  Surgeon: Jerel Balding, MD;  Location: [facility] ENDOSCOPY;  Service: Cardiovascular;  Laterality: N/A;   Patient Active Problem List   Diagnosis Date Noted   History of CVA (cerebrovascular accident) 05/28/2021   Hyperlipidemia 06/17/2019   Class 1 obesity due to excess  calories with serious comorbidity and body mass index (BMI) of 32.0 to 32.9 in adult 12/05/2018   Postmenopausal 12/05/2018   Family history of diabetes mellitus in father 12/05/2018   Impaired fasting glucose 12/05/2018   Small cell carcinoma (HCC)- of the cervix 12/05/2018   Hip pain, bilateral 05/15/2018   Essential hypertension 01/29/2018   Chronic insomnia 01/29/2018   MDD (major depressive disorder), single episode 01/29/2018   Excessive drinking of alcohol  01/29/2018   Chronic anticoagulation 10/06/2016   Mitral valve disease    PAF (paroxysmal atrial fibrillation) (HCC) 07/01/2013   LAFB (left anterior fascicular block) 07/01/2013   Lymphedema 07/01/2013   h/o Cervical cancer 10/01/2011   H/O total hysterectomy 10/01/2011    PCP:   REFERRING PROVIDER: Ronal Pinal, MD  REFERRING DIAG: Right LE Lymphedema  THERAPY DIAG:  Malignant neoplasm of exocervix (HCC)  Lymphedema, not elsewhere classified  S/P total hysterectomy  ONSET DATE: April 2025 exacerbation  Rationale for Evaluation and Treatment: Rehabilitation  SUBJECTIVE:  SUBJECTIVE STATEMENT:       EVAL I have not been wearing garments but I have been using the pump every night. I am not using the Flexi Touch because It doesn't fit properly since I lost 30 lbs and I don't know how to adjust it. I have been using a basic pump and it does OK and my leg reduces but by the next day swelling returns. She has an Ablation for A Fib October 10. In 2 weeks we go to the [facility]  for 10 days. Pt has old compression stockings, a Flexi touch and another basic style pump, and a Sigvaris night garment.   PERTINENT HISTORY:  radical hysterectomy in 1986 for small cell cervical endocrine cancer with 40 pelvic lymph nodes removed with chemo,  no radiation. She developed some swelling after the procedure but has noticed and increase as she has gotten older and with some weight gain. She has a totat knee June 2022 and has had an increase in lymphedema since. She has an extremity pump from [employer] that she got in June 2022 and a Flexi touch that she received in 2023.[name] She has been treated here from 02/24/2021-06/2021 PAIN:  Are you having pain? No, occasionally if I move unexpectedly  PRECAUTIONS: Right LE lymphedema, prior CVA,Anemia, cervical CA, HLD, HTN, A-Fib, s/p TKA   RED FLAGS: None   WEIGHT BEARING RESTRICTIONS: No  FALLS:  Has patient fallen in last 6 months? No  LIVING ENVIRONMENT: Lives with: lives with their spouse Lives in: House/apartment Stairs: No;    OCCUPATION: Retired  LEISURE: read, cross word puzzles  PRIOR LEVEL OF FUNCTION: Independent  PATIENT GOALS: Decrease swelling right LE   OBJECTIVE: Note: Objective measures were completed at Evaluation unless otherwise noted.  COGNITION: Overall cognitive status: Within functional limits for tasks assessed   PALPATION: No pitting edema  OBSERVATIONS / OTHER ASSESSMENTS: significant right LE swelling, non pitting, but relatively soft  SENSATION: Light touch: Deficits     POSTURE: Round shoulders, forward head  LYMPHEDEMA ASSESSMENTS:   SURGERY TYPE/DATE: 03/19/1984  NUMBER OF LYMPH NODES REMOVED: 40  CHEMOTHERAPY: yes  RADIATION:  HORMONE TREATMENT: no  INFECTIONS:   LYMPHEDEMA ASSESSMENTS:   LOWER EXTREMITY LANDMARK RIGHT eval RIGHT 11/25/2023 RIGHT 12/02/2023 RIGHT 12/18/2023  At groin 61.9     30 cm proximal to suprapatella      20 cm proximal to suprapatella 58.2  56.3   10 cm proximal to suprapatella 51.4  49.5   At midpatella / popliteal crease 38.1     30 cm proximal to floor at lateral plantar foot 41.9 40.5 38.8 39.7  20 cm proximal to floor at lateral plantar foot 39.2 39 35.2 37.6  10 cm proximal to floor at lateral  plantar foot 32.7 31.4 30.0 32.3  Circumference of ankle/heel      5 cm proximal to 1st MTP joint 21.7     Across MTP joint 21.9     Around proximal great toe 8.0  7.5 7.7  (Blank rows = not tested)  LOWER EXTREMITY LANDMARK LEFT eval  At groin 59.5  30 cm proximal to suprapatella   20 cm proximal to suprapatella 56.5  10 cm proximal to suprapatella 45.2  At midpatella / popliteal crease 34.9  30 cm proximal to floor at lateral plantar foot 37.9  20 cm proximal to floor at lateral plantar foot 30.6  10 cm proximal to floor at lateral plantar foot 22.8  Circumference of ankle/heel  5 cm proximal to 1st MTP joint 20.9  Across MTP joint 20.5  Around proximal great toe 7.3  (Blank rows = not tested)  FUNCTIONAL TESTS:    GAIT:WFL   Outcome measure:LLIS: 22%                                                                                                                            TREATMENT DATE:     01/08/2024 Signed pt up for measuring on Wednesday Pts leg reduced after Mondays wrap. MLD to Rt LE: Short neck, superficial and deep abdominals, Rt axillary nodes, Rt ingiuno-axillary anastomosis, then Rt LE as follows: lateral thigh, medial to latera, 4 techniques at knee, ant and post lower leg, ankle, retro malleoli, dorsal foot/toes then retraced all steps.   Compression Bandaging to Rt LE as follows: Cocoa butter, Medium TG soft foot to thigh, Small amt of artiflex to foot and ankle and then at pop fossa, then Comprilan soft 10 cm ankle to knee, and 15 cm  comprilan knee to thigh.  Wrapped 1st 3 toes with mollelast due to swelling, 6 cm to foot Roman sandal, then 8 cm ASH pattern. 12 cm ankle to knee, then 12 cm below knee to mid thigh, and additional 12 cm knee to groin, then last 12 cm mid shin to groin. Checked capillary refill and was good Measure next   01/06/2024 Pts leg much more swollen today after going a week without compression due to ablation MLD to Rt LE: Short  neck, superficial and deep abdominals, Rt axillary nodes, Rt ingiuno-axillary anastomosis, then Rt LE as follows: lateral thigh, medial to latera, 4 techniques at knee, ant and post lower leg, ankle, retro malleoli, dorsal foot/toes then retraced all steps.   Compression Bandaging to Rt LE as follows: Cocoa butter, Medium TG soft foot to thigh, Small amt of artiflex to foot and ankle and then at pop fossa, then Comprilan soft 10 cm ankle to knee, and 15 cm  comprilan knee to thigh.  Wrapped 1st 3 toes with mollelast due to swelling, 6 cm to foot Roman sandal, then 8 cm ASH pattern. 12 cm ankle to knee, then 12 cm below knee to mid thigh, and additional 12 cm knee to groin, then last 12 cm mid shin to groin. Checked capillary refill and was good   12/30/2023 After discussion with Eward Sharps PT supervisor it was determined we should not treat pt for the remainder of this week due to possibility of increased pressure causing arterial bleeding. Cancelled appts this week, and pt will resume next week.   12/24/2023 MLD to Rt LE: Short neck, superficial and deep abdominals, Rt axillary nodes, Rt ingiuno-axillary anastomosis, then Rt LE as follows: lateral thigh, medial to latera, 4 techniques at knee, ant and post lower leg, ankle, retro malleoli, dorsal foot/toes then retraced all steps.   Compression Bandaging to Rt LE as follows: Cocoa butter, Medium TG soft foot to thigh, Small amt  of artiflex to foot and ankle and then at pop fossa, then Comprilan soft 10 cm ankle to knee, and 15 cm  comprilan knee to thigh.  Wrapped 1st 3 toes with mollelast due to swelling, 6 cm to foot Roman sandal, then 8 cm ASH pattern. 12 cm ankle to knee, then 12 cm below knee to mid thigh, and additional 12 cm knee to groin, then last 12 cm mid shin to groin. Checked capillary refill and was good   12/18/2023 Measured lower leg; increased throughout secondary to pt not wearing stocking for 5/10 days while away on her trip MLD to  Rt LE: Short neck, superficial and deep abdominals, Rt axillary nodes, Rt ingiuno-axillary anastomosis, then Rt LE as follows: lateral thigh, medial to latera, 4 techniques at knee, ant and post lower leg, ankle, retro malleoli, dorsal foot/toes then retraced all steps.   Compression Bandaging to Rt LE as follows: Cocoa butter, Medium TG soft foot to thigh, Small amt of artiflex to foot and ankle and then at pop fossa, then Comprilan soft 10 cm ankle to knee, and 15 cm  comprilan knee to thigh.  Wrapped 1st 3 toes with mollelast due to swelling, 6 cm to foot Roman sandal, then 8 cm ASH pattern. 12 cm ankle to knee, then 12 cm below knee to mid thigh, and additional 12 cm knee to groin, then last 12 cm mid shin to groin. Checked capillary refill and was good  12/05/2023 Manual Therapy MLD to Rt LE: Short neck, superficial and deep abdominals, Rt axillary nodes, Rt ingiuno-axillary anastomosis, then Rt LE as follows: lateral thigh, medial to latera, 4 techniques at knee, ant and post lower leg, ankle, retro malleoli, dorsal foot/toes then retraced all steps.   Compression Bandaging to Rt LE as follows: Cocoa butter, Medium TG soft foot to thigh, Small amt of artiflex to foot and ankle and then at pop fossa, then Comprilan soft 10 cm ankle to knee, and 15 cm  comprilan knee to thigh.  Wrapped 1st 3 toes with mollelast due to swelling, 6 cm to foot Roman sandal, then 8 cm ASH pattern. 12 cm ankle to knee, then 12 cm below knee to mid thigh, and additional 12 cm knee to groin, then last 12 cm mid shin to groin. Checked capillary refill and was good.   12/02/2023 Pt measured with excellent results despite being in stocking today Manual Therapy MLD to Rt LE: Short neck, superficial and deep abdominals, Rt axillary nodes, Rt ingiuno-axillary anastomosis, then Rt LE as follows: lateral thigh, medial to latera, 4 techniques at knee, ant and post lower leg, ankle, retro malleoli, dorsal foot/toes then retraced all  steps.   Compression Bandaging to Rt LE as follows: Cocoa butter, Medium TG soft foot to thigh, Small amt of artiflex to foot and ankle and then at pop fossa, then Comprilan soft 10 cm ankle to knee, and 15 cm  comprilan knee to thigh.  Wrapped 1st 3 toes with mollelast due to swelling, 6 cm to foot Roman sandal, then 8 cm ASH pattern. 12 cm ankle to knee, then 12 cm below knee to mid thigh, and additional 12 cm knee to groin, then last 12 cm mid shin to groin. Checked capillary refill and was good.     11/29/2023 Manual Therapy MLD to Rt LE: Short neck, superficial and deep abdominals, Rt axillary nodes, Rt ingiuno-axillary anastomosis, then Rt LE as follows: lateral thigh, medial to latera, 4 techniques at knee, ant and post  lower leg, ankle, retro malleoli, dorsal foot/toes then retraced all steps.   Compression Bandaging to Rt LE as follows: Cocoa butter, Medium TG soft foot to thigh, Small amt of artiflex to foot and ankle and then at pop fossa, then Comprilan soft 10 cm ankle to knee, and 15 cm  comprilan knee to thigh.  Wrapped 1st 3 toes with mollelast due to swelling, 6 cm to foot Roman sandal, then 8 cm ASH pattern. 12 cm ankle to knee, then 12 cm below knee to mid thigh, and additional 12 cm knee to groin, then last 12 cm mid shin to groin. Checked capillary refill and was good.    11/27/23: Manual Therapy MLD to Rt LE: Short neck, superficial and deep abdominals, Rt axillary nodes, Rt ingiuno-axillary anastomosis, then Rt LE as follows: lateral thigh, medial to latera, 4 techniques at knee, ant and post lower leg, ankle, retro malleoli, dorsal foot/toes then retraced all steps. While performing instructed pt in importance of her continuing self MLD at home. But when bandages on only needs to focus on pretreat, axillary nodes and anastomosis as done today. Reviewed correct technique and directionality.  Compression Bandaging to Rt LE as follows: Cocoa butter, Medium TG soft foot to thigh,  Small amt of artiflex to foot and ankle and then at pop fossa, then Comprilan soft 10 cm ankle to knee, and 15 cm  comprilan knee to thigh.  6 cm to foot Roman sandal, then 8 cm ASH pattern. 12 cm ankle to knee, then 12 cm below knee to mid thigh, and additional 12 cm knee to groin, then last 12 cm mid shin to groin. Pts husband did the 6 cm and cuing offered for reinforcement throughout, mostly not to pull bandage but instead offer constant tension throughout. Checked capillary refill and was good. Assisted pt with donning on her velcro sandals. Self Care Instructed pts husband when he was applying 6 cm bandage, see above. Then at end of session explained difference between Active phase of CDT treatment, which we are doing now, then we work to transition to the Maintenance Phase with her new flat knit garment and then they don't have to cont bandaging IF she remains compliant and consistent with daily wear of stocking.   11/25/2023 Measured lower leg with some reduction since wearing stocking Pts husband instructed in compression bandaging . Instructed first by therapist and then pts husband demonstrated. Therapist applied Medium TG soft foot to thigh, Small amt of artiflex to foot and ankle, then Comprilan soft 10 cm ankle to knee, and 15 cm  comprilan knee to thigh. 6 cm to foot with figure 8's, then 8 cm with heel locks and circles up leg. 12 cm mid leg to knee, then 12 cm below knee to mid thigh, and additional 12 cm knee to groin. Pts husband videotaped therapist wrapping, and also performed 6 cm and 8 cm wrap 2 x's each and observed therapist do the remainder due to time. Checked capillary refill and was good. Pt put on her velcro sandals. Left out toe wraps at pt requuest.  11/20/2023   PATIENT EDUCATION:  Education details: POC, LOS, TREATMENT interventions; Twyla Mort name to call re: adjusting Flexi, Discussed compression stockings maintain, vs bandaging reduces and is very impt part of CDT in  addition to MLD or pump. Person educated: Patient and Spouse Education method: Explanation Education comprehension: verbalized understanding  HOME EXERCISE PROGRAM:   ASSESSMENT:  CLINICAL IMPRESSION:  Pts lower leg reduced well from  Mondays visit. Will Measure on Friday. Pts husband to remove top wrap and redo if it gets loose. EVAL Patient is a 73 y.o. female who was seen today for physical therapy evaluation and treatment for progression of her right  LE lymphedema. . She has lymphostatic lymphedema with congestion and hyperkeratosis. She has been treated for Right LE lymphedema most recently 2 years ago and has a Flexi touch that no longer fits properly due to 30 lb wt. Loss.  Her husband was previously independent in compression bandaging, and is willing to help his wife but will need review. We discussed importance of compression to reduce her leg as she questioned why her leg continues to swell despite using a pump..She has compression stockings but has not been wearing them. She will benefit from skilled therapy to address deficits and return pt to a more fxl lifestyle.  OBJECTIVE IMPAIRMENTS: decreased activity tolerance, decreased knowledge of condition, increased edema, and postural dysfunction.   ACTIVITY LIMITATIONS: locomotion level  PARTICIPATION LIMITATIONS: no restrictions  PERSONAL FACTORS: chronic lymphedema, abdominal surgery for cervical cancer removal with 40 lymph nodes removed, right total knee in June 2022.  are also affecting patient's functional outcome.   REHAB POTENTIAL: Good  CLINICAL DECISION MAKING: Stable/uncomplicated  EVALUATION COMPLEXITY: Low   GOALS: Goals reviewed with patient? Yes  SHORT TERM GOALS: Target date: 12/18/2023 // Pt/ husband will be able to do self compression bandaging  Baseline: Goal status: INITIAL  2.  Pts husband will perform MLD or may use compression pump with MLD prn Baseline:  Goal status: INITIAL  3.  Pt will  reduce at 10 cm prox to suprapatella by 1.5 cm Baseline:  Goal status: INITIAL  4.  Pt will reduce at 20 cm prox to floor by 2 cm Baseline:  Goal status: MET  5.  Pt will call flexi touch and will have garment refit due to her wt. loss Baseline:  Goal status: INITIAL  LONG TERM GOALS: Target date: 01/15/2024  Pt will have appropriate day  time garments to control lymphedema Baseline:  Goal status: INITIAL  2.  Pt reports she knows how to control her lymphedema at home with lymph drainage manually or with pump, compression and exercise  Baseline:  Goal status: INITIAL  3.  Pt pt will reduce at 10 cm prox to patella by 3 cm to demonstrate improved swelling Baseline:  Goal status: INITIAL  4.  Pt will reduce at 20 cm prox to floor by 4.5 cmt o demonstrate improved swelling Baseline:  Goal status: INITIAL  PLAN:  PT FREQUENCY: 3x/week  PT DURATION: 8 weeks  PLANNED INTERVENTIONS: [id number]- PT Re-evaluation, 97110-Therapeutic exercises, [id number]- Self Care, [id number]- Manual therapy, 97760- Orthotic Initial, [id number]- Orthotic/Prosthetic subsequent, Manual lymph drainage, and Compression bandaging  PLAN FOR NEXT SESSION: Cont to instruct pts husband in wrapping,  out of town in 10 days, MLD, measure weekly. Measure for garment on return if leg looks good.   Grayce JINNY Sheldon, PT 01/08/2024, 11:13 AM

## 2024-01-10 ENCOUNTER — Ambulatory Visit: Payer: Self-pay

## 2024-01-10 DIAGNOSIS — Z90710 Acquired absence of both cervix and uterus: Secondary | ICD-10-CM | POA: Diagnosis not present

## 2024-01-10 DIAGNOSIS — C531 Malignant neoplasm of exocervix: Secondary | ICD-10-CM

## 2024-01-10 DIAGNOSIS — I890 Lymphedema, not elsewhere classified: Secondary | ICD-10-CM | POA: Diagnosis not present

## 2024-01-10 NOTE — Therapy (Signed)
 OUTPATIENT PHYSICAL THERAPY  LOWER EXTREMITY ONCOLOGY TREATMENT  Patient Name: Savannah Hill MRN: [id number] DOB:09-06-1950, 73 y.o., female Today's Date: 01/10/2024  END OF SESSION:  PT End of Session - 01/10/24 0858     Visit Number 11    Number of Visits 24    Date for Recertification  01/15/24    PT Start Time 0900    PT Stop Time 0958    PT Time Calculation (min) 58 min    Activity Tolerance Patient tolerated treatment well    Behavior During Therapy [facility] for tasks assessed/performed          Past Medical History:  Diagnosis Date   Anemia    borderline   Atrial fibrillation (HCC)    a. s/p ablation on 10/05/2016   Cancer (HCC)    cervical/rad hysterectomy/bso wiht chemo for small cell ca   Dyspnea    Heart valve disorder    HLD (hyperlipidemia)    Hypertension    Joint pain    Lower extremity edema    Obesity    Palpitations    Past Surgical History:  Procedure Laterality Date   ABLATION OF DYSRHYTHMIC FOCUS  10/05/2016   ATRIAL FIBRILLATION ABLATION N/A 10/05/2016   Procedure: Atrial Fibrillation Ablation;  Surgeon: Savannah Soyla Lunger, MD;  Location: [facility] INVASIVE CV LAB;  Service: Cardiovascular;  Laterality: N/A;   ATRIAL FIBRILLATION ABLATION N/A 12/27/2023   Procedure: ATRIAL FIBRILLATION ABLATION;  Surgeon: Savannah Soyla Lunger, MD;  Location: [facility] INVASIVE CV LAB;  Service: Cardiovascular;  Laterality: N/A;   IR RADIOLOGY PERIPHERAL GUIDED IV START  09/28/2016   IR US  GUIDE VASC ACCESS RIGHT  09/28/2016   RADICAL ABDOMINAL HYSTERECTOMY  1986   with BSO   REPLACEMENT TOTAL KNEE Right 2022   TEE WITHOUT CARDIOVERSION N/A 06/07/2016   Procedure: TRANSESOPHAGEAL ECHOCARDIOGRAM (TEE);  Surgeon: Savannah Balding, MD;  Location: [facility] ENDOSCOPY;  Service: Cardiovascular;  Laterality: N/A;   Patient Active Problem List   Diagnosis Date Noted   History of CVA (cerebrovascular accident) 05/28/2021   Hyperlipidemia 06/17/2019   Class 1 obesity due to excess  calories with serious comorbidity and body mass index (BMI) of 32.0 to 32.9 in adult 12/05/2018   Postmenopausal 12/05/2018   Family history of diabetes mellitus in father 12/05/2018   Impaired fasting glucose 12/05/2018   Small cell carcinoma (HCC)- of the cervix 12/05/2018   Hip pain, bilateral 05/15/2018   Essential hypertension 01/29/2018   Chronic insomnia 01/29/2018   MDD (major depressive disorder), single episode 01/29/2018   Excessive drinking of alcohol  01/29/2018   Chronic anticoagulation 10/06/2016   Mitral valve disease    PAF (paroxysmal atrial fibrillation) (HCC) 07/01/2013   LAFB (left anterior fascicular block) 07/01/2013   Lymphedema 07/01/2013   h/o Cervical cancer 10/01/2011   H/O total hysterectomy 10/01/2011    PCP:   REFERRING PROVIDER: Ronal Pinal, MD  REFERRING DIAG: Right LE Lymphedema  THERAPY DIAG:  Malignant neoplasm of exocervix (HCC)  Lymphedema, not elsewhere classified  S/P total hysterectomy  ONSET DATE: April 2025 exacerbation  Rationale for Evaluation and Treatment: Rehabilitation  SUBJECTIVE:  SUBJECTIVE STATEMENT:   The was good but it slid down yesterday. I took it off this am.    EVAL I have not been wearing garments but I have been using the pump every night. I am not using the Flexi Touch because It doesn't fit properly since I lost 30 lbs and I don't know how to adjust it. I have been using a basic pump and it does OK and my leg reduces but by the next day swelling returns. She has an Ablation for A Fib October 10. In 2 weeks we go to the [facility]  for 10 days. Pt has old compression stockings, a Flexi touch and another basic style pump, and a Sigvaris night garment.   PERTINENT HISTORY:  radical hysterectomy in 1986 for small cell cervical  endocrine cancer with 40 pelvic lymph nodes removed with chemo, no radiation. She developed some swelling after the procedure but has noticed and increase as she has gotten older and with some weight gain. She has a totat knee June 2022 and has had an increase in lymphedema since. She has an extremity pump from [employer] that she got in June 2022 and a Flexi touch that she received in 2023.Savannah Hill She has been treated here from 02/24/2021-06/2021 PAIN:  Are you having pain? No, occasionally if I move unexpectedly  PRECAUTIONS: Right LE lymphedema, prior CVA,Anemia, cervical CA, HLD, HTN, A-Fib, s/p TKA   RED FLAGS: None   WEIGHT BEARING RESTRICTIONS: No  FALLS:  Has patient fallen in last 6 months? No  LIVING ENVIRONMENT: Lives with: lives with their spouse Lives in: House/apartment Stairs: No;    OCCUPATION: Retired  LEISURE: read, cross word puzzles  PRIOR LEVEL OF FUNCTION: Independent  PATIENT GOALS: Decrease swelling right LE   OBJECTIVE: Note: Objective measures were completed at Evaluation unless otherwise noted.  COGNITION: Overall cognitive status: Within functional limits for tasks assessed   PALPATION: No pitting edema  OBSERVATIONS / OTHER ASSESSMENTS: significant right LE swelling, non pitting, but relatively soft  SENSATION: Light touch: Deficits     POSTURE: Round shoulders, forward head  LYMPHEDEMA ASSESSMENTS:   SURGERY TYPE/DATE: 03/19/1984  NUMBER OF LYMPH NODES REMOVED: 40  CHEMOTHERAPY: yes  RADIATION:  HORMONE TREATMENT: no  INFECTIONS:   LYMPHEDEMA ASSESSMENTS:   LOWER EXTREMITY LANDMARK RIGHT eval RIGHT 11/25/2023 RIGHT 12/02/2023 RIGHT 12/18/2023 RIGHT 01/10/2024  At groin 61.9      30 cm proximal to suprapatella       20 cm proximal to suprapatella 58.2  56.3  56  10 cm proximal to suprapatella 51.4  49.5  48  At midpatella / popliteal crease 38.1    37  30 cm proximal to floor at lateral plantar foot 41.9 40.5 38.8 39.7 40  20 cm  proximal to floor at lateral plantar foot 39.2 39 35.2 37.6 36  10 cm proximal to floor at lateral plantar foot 32.7 31.4 30.0 32.3 29.9  Circumference of ankle/heel       5 cm proximal to 1st MTP joint 21.7    21.4  Across MTP joint 21.9      Around proximal great toe 8.0  7.5 7.7 7.8  (Blank rows = not tested)  LOWER EXTREMITY LANDMARK LEFT eval  At groin 59.5  30 cm proximal to suprapatella   20 cm proximal to suprapatella 56.5  10 cm proximal to suprapatella 45.2  At midpatella / popliteal crease 34.9  30 cm proximal to floor at lateral plantar foot 37.9  20 cm proximal to floor at lateral plantar foot 30.6  10 cm proximal to floor at lateral plantar foot 22.8  Circumference of ankle/heel   5 cm proximal to 1st MTP joint 20.9  Across MTP joint 20.5  Around proximal great toe 7.3  (Blank rows = not tested)  FUNCTIONAL TESTS:    GAIT:WFL   Outcome measure:LLIS: 22%                                                                                                                            TREATMENT DATE:  01/10/2024 Measured Right LE MLD to Rt LE: Short neck, superficial and deep abdominals, Rt axillary nodes, Rt ingiuno-axillary anastomosis, then Rt LE as follows: lateral thigh, medial to latera, 4 techniques at knee, ant and post lower leg, ankle, retro malleoli, dorsal foot/toes then retraced all steps.   Compression Bandaging to Rt LE as follows: Cocoa butter, Medium TG soft foot to thigh, Small amt of artiflex to foot and ankle and then at pop fossa, then Comprilan soft 10 cm ankle to knee, and 15 cm  comprilan knee to thigh.  Wrapped 1st 3 toes with mollelast due to swelling, 6 cm to foot Roman sandal, then 8 cm ASH pattern. 12 cm ankle to knee, then 12 cm below knee to mid thigh, and additional 12 cm knee to groin, then last 12 cm mid shin to groin. Checked capillary refill and was good  01/08/2024 Signed pt up for measuring on Wednesday Pts leg reduced after Mondays  wrap. MLD to Rt LE: Short neck, superficial and deep abdominals, Rt axillary nodes, Rt ingiuno-axillary anastomosis, then Rt LE as follows: lateral thigh, medial to latera, 4 techniques at knee, ant and post lower leg, ankle, retro malleoli, dorsal foot/toes then retraced all steps.   Compression Bandaging to Rt LE as follows: Cocoa butter, Medium TG soft foot to thigh, Small amt of artiflex to foot and ankle and then at pop fossa, then Comprilan soft 10 cm ankle to knee, and 15 cm  comprilan knee to thigh.  Wrapped 1st 3 toes with mollelast due to swelling, 6 cm to foot Roman sandal, then 8 cm ASH pattern. 12 cm ankle to knee, then 12 cm below knee to mid thigh, and additional 12 cm knee to groin, then last 12 cm mid shin to groin. Checked capillary refill and was good Measure next   01/06/2024 Pts leg much more swollen today after going a week without compression due to ablation MLD to Rt LE: Short neck, superficial and deep abdominals, Rt axillary nodes, Rt ingiuno-axillary anastomosis, then Rt LE as follows: lateral thigh, medial to latera, 4 techniques at knee, ant and post lower leg, ankle, retro malleoli, dorsal foot/toes then retraced all steps.   Compression Bandaging to Rt LE as follows: Cocoa butter, Medium TG soft foot to thigh, Small amt of artiflex to foot and ankle and then at pop fossa, then Comprilan soft 10 cm ankle  to knee, and 15 cm  comprilan knee to thigh.  Wrapped 1st 3 toes with mollelast due to swelling, 6 cm to foot Roman sandal, then 8 cm ASH pattern. 12 cm ankle to knee, then 12 cm below knee to mid thigh, and additional 12 cm knee to groin, then last 12 cm mid shin to groin. Checked capillary refill and was good   12/30/2023 After discussion with Eward Sharps PT supervisor it was determined we should not treat pt for the remainder of this week due to possibility of increased pressure causing arterial bleeding. Cancelled appts this week, and pt will resume next  week.   12/24/2023 MLD to Rt LE: Short neck, superficial and deep abdominals, Rt axillary nodes, Rt ingiuno-axillary anastomosis, then Rt LE as follows: lateral thigh, medial to latera, 4 techniques at knee, ant and post lower leg, ankle, retro malleoli, dorsal foot/toes then retraced all steps.   Compression Bandaging to Rt LE as follows: Cocoa butter, Medium TG soft foot to thigh, Small amt of artiflex to foot and ankle and then at pop fossa, then Comprilan soft 10 cm ankle to knee, and 15 cm  comprilan knee to thigh.  Wrapped 1st 3 toes with mollelast due to swelling, 6 cm to foot Roman sandal, then 8 cm ASH pattern. 12 cm ankle to knee, then 12 cm below knee to mid thigh, and additional 12 cm knee to groin, then last 12 cm mid shin to groin. Checked capillary refill and was good   12/18/2023 Measured lower leg; increased throughout secondary to pt not wearing stocking for 5/10 days while away on her trip MLD to Rt LE: Short neck, superficial and deep abdominals, Rt axillary nodes, Rt ingiuno-axillary anastomosis, then Rt LE as follows: lateral thigh, medial to latera, 4 techniques at knee, ant and post lower leg, ankle, retro malleoli, dorsal foot/toes then retraced all steps.   Compression Bandaging to Rt LE as follows: Cocoa butter, Medium TG soft foot to thigh, Small amt of artiflex to foot and ankle and then at pop fossa, then Comprilan soft 10 cm ankle to knee, and 15 cm  comprilan knee to thigh.  Wrapped 1st 3 toes with mollelast due to swelling, 6 cm to foot Roman sandal, then 8 cm ASH pattern. 12 cm ankle to knee, then 12 cm below knee to mid thigh, and additional 12 cm knee to groin, then last 12 cm mid shin to groin. Checked capillary refill and was good  12/05/2023 Manual Therapy MLD to Rt LE: Short neck, superficial and deep abdominals, Rt axillary nodes, Rt ingiuno-axillary anastomosis, then Rt LE as follows: lateral thigh, medial to latera, 4 techniques at knee, ant and post lower leg,  ankle, retro malleoli, dorsal foot/toes then retraced all steps.   Compression Bandaging to Rt LE as follows: Cocoa butter, Medium TG soft foot to thigh, Small amt of artiflex to foot and ankle and then at pop fossa, then Comprilan soft 10 cm ankle to knee, and 15 cm  comprilan knee to thigh.  Wrapped 1st 3 toes with mollelast due to swelling, 6 cm to foot Roman sandal, then 8 cm ASH pattern. 12 cm ankle to knee, then 12 cm below knee to mid thigh, and additional 12 cm knee to groin, then last 12 cm mid shin to groin. Checked capillary refill and was good.   12/02/2023 Pt measured with excellent results despite being in stocking today Manual Therapy MLD to Rt LE: Short neck, superficial and deep abdominals, Rt  axillary nodes, Rt ingiuno-axillary anastomosis, then Rt LE as follows: lateral thigh, medial to latera, 4 techniques at knee, ant and post lower leg, ankle, retro malleoli, dorsal foot/toes then retraced all steps.   Compression Bandaging to Rt LE as follows: Cocoa butter, Medium TG soft foot to thigh, Small amt of artiflex to foot and ankle and then at pop fossa, then Comprilan soft 10 cm ankle to knee, and 15 cm  comprilan knee to thigh.  Wrapped 1st 3 toes with mollelast due to swelling, 6 cm to foot Roman sandal, then 8 cm ASH pattern. 12 cm ankle to knee, then 12 cm below knee to mid thigh, and additional 12 cm knee to groin, then last 12 cm mid shin to groin. Checked capillary refill and was good.     11/29/2023 Manual Therapy MLD to Rt LE: Short neck, superficial and deep abdominals, Rt axillary nodes, Rt ingiuno-axillary anastomosis, then Rt LE as follows: lateral thigh, medial to latera, 4 techniques at knee, ant and post lower leg, ankle, retro malleoli, dorsal foot/toes then retraced all steps.   Compression Bandaging to Rt LE as follows: Cocoa butter, Medium TG soft foot to thigh, Small amt of artiflex to foot and ankle and then at pop fossa, then Comprilan soft 10 cm ankle to knee,  and 15 cm  comprilan knee to thigh.  Wrapped 1st 3 toes with mollelast due to swelling, 6 cm to foot Roman sandal, then 8 cm ASH pattern. 12 cm ankle to knee, then 12 cm below knee to mid thigh, and additional 12 cm knee to groin, then last 12 cm mid shin to groin. Checked capillary refill and was good.    11/27/23: Manual Therapy MLD to Rt LE: Short neck, superficial and deep abdominals, Rt axillary nodes, Rt ingiuno-axillary anastomosis, then Rt LE as follows: lateral thigh, medial to latera, 4 techniques at knee, ant and post lower leg, ankle, retro malleoli, dorsal foot/toes then retraced all steps. While performing instructed pt in importance of her continuing self MLD at home. But when bandages on only needs to focus on pretreat, axillary nodes and anastomosis as done today. Reviewed correct technique and directionality.  Compression Bandaging to Rt LE as follows: Cocoa butter, Medium TG soft foot to thigh, Small amt of artiflex to foot and ankle and then at pop fossa, then Comprilan soft 10 cm ankle to knee, and 15 cm  comprilan knee to thigh.  6 cm to foot Roman sandal, then 8 cm ASH pattern. 12 cm ankle to knee, then 12 cm below knee to mid thigh, and additional 12 cm knee to groin, then last 12 cm mid shin to groin. Pts husband did the 6 cm and cuing offered for reinforcement throughout, mostly not to pull bandage but instead offer constant tension throughout. Checked capillary refill and was good. Assisted pt with donning on her velcro sandals. Self Care Instructed pts husband when he was applying 6 cm bandage, see above. Then at end of session explained difference between Active phase of CDT treatment, which we are doing now, then we work to transition to the Maintenance Phase with her new flat knit garment and then they don't have to cont bandaging IF she remains compliant and consistent with daily wear of stocking.   11/25/2023 Measured lower leg with some reduction since wearing stocking Pts  husband instructed in compression bandaging . Instructed first by therapist and then pts husband demonstrated. Therapist applied Medium TG soft foot to thigh, Small amt of  artiflex to foot and ankle, then Comprilan soft 10 cm ankle to knee, and 15 cm  comprilan knee to thigh. 6 cm to foot with figure 8's, then 8 cm with heel locks and circles up leg. 12 cm mid leg to knee, then 12 cm below knee to mid thigh, and additional 12 cm knee to groin. Pts husband videotaped therapist wrapping, and also performed 6 cm and 8 cm wrap 2 x's each and observed therapist do the remainder due to time. Checked capillary refill and was good. Pt put on her velcro sandals. Left out toe wraps at pt requuest.  11/20/2023   PATIENT EDUCATION:  Education details: POC, LOS, TREATMENT interventions; Twyla Mort name to call re: adjusting Flexi, Discussed compression stockings maintain, vs bandaging reduces and is very impt part of CDT in addition to MLD or pump. Person educated: Patient and Spouse Education method: Explanation Education comprehension: verbalized understanding  HOME EXERCISE PROGRAM:   ASSESSMENT:  CLINICAL IMPRESSION:  Pt achieved STG's 3,4,5 and LTG #3 Should be ready to be measured for garments on Wednesday. EVAL Patient is a 73 y.o. female who was seen today for physical therapy evaluation and treatment for progression of her right  LE lymphedema. . She has lymphostatic lymphedema with congestion and hyperkeratosis. She has been treated for Right LE lymphedema most recently 2 years ago and has a Flexi touch that no longer fits properly due to 30 lb wt. Loss.  Her husband was previously independent in compression bandaging, and is willing to help his wife but will need review. We discussed importance of compression to reduce her leg as she questioned why her leg continues to swell despite using a pump..She has compression stockings but has not been wearing them. She will benefit from skilled therapy to  address deficits and return pt to a more fxl lifestyle.  OBJECTIVE IMPAIRMENTS: decreased activity tolerance, decreased knowledge of condition, increased edema, and postural dysfunction.   ACTIVITY LIMITATIONS: locomotion level  PARTICIPATION LIMITATIONS: no restrictions  PERSONAL FACTORS: chronic lymphedema, abdominal surgery for cervical cancer removal with 40 lymph nodes removed, right total knee in June 2022.  are also affecting patient's functional outcome.   REHAB POTENTIAL: Good  CLINICAL DECISION MAKING: Stable/uncomplicated  EVALUATION COMPLEXITY: Low   GOALS: Goals reviewed with patient? Yes  SHORT TERM GOALS: Target date: 12/18/2023 // Pt/ husband will be able to do self compression bandaging  Baseline: Goal status: INITIAL  2.  Pts husband will perform MLD or may use compression pump with MLD prn Baseline:  Goal status: INITIAL  3.  Pt will reduce at 10 cm prox to suprapatella by 1.5 cm Baseline:  Goal status: MET 4.  Pt will reduce at 20 cm prox to floor by 2 cm Baseline:  Goal status: MET  5.  Pt will call flexi touch and will have garment refit due to her wt. loss Baseline:  Goal status: MET LONG TERM GOALS: Target date: 01/15/2024  Pt will have appropriate day  time garments to control lymphedema Baseline:  Goal status: INITIAL  2.  Pt reports she knows how to control her lymphedema at home with lymph drainage manually or with pump, compression and exercise  Baseline:  Goal status: INITIAL  3.  Pt pt will reduce at 10 cm prox to patella by 3 cm to demonstrate improved swelling Baseline:  Goal status: MET  4.  Pt will reduce at 20 cm prox to floor by 4.5 cmt o demonstrate improved swelling Baseline:  Goal  status: INITIAL  PLAN:  PT FREQUENCY: 3x/week  PT DURATION: 8 weeks  PLANNED INTERVENTIONS: 97164- PT Re-evaluation, 97110-Therapeutic exercises, 97535- Self Care, [id number]- Manual therapy, 97760- Orthotic Initial, [id number]- Orthotic/Prosthetic  subsequent, Manual lymph drainage, and Compression bandaging  PLAN FOR NEXT SESSION: Cont to instruct pts husband in wrapping,   MLD, measure weekly. Measure for garment on return if leg looks good.   Grayce JINNY Sheldon, PT 01/10/2024, 9:59 AM

## 2024-01-11 ENCOUNTER — Other Ambulatory Visit: Payer: Self-pay | Admitting: Family Medicine

## 2024-01-11 DIAGNOSIS — F5101 Primary insomnia: Secondary | ICD-10-CM

## 2024-01-13 ENCOUNTER — Telehealth: Payer: Self-pay | Admitting: *Deleted

## 2024-01-13 ENCOUNTER — Ambulatory Visit: Payer: Self-pay

## 2024-01-13 DIAGNOSIS — Z90710 Acquired absence of both cervix and uterus: Secondary | ICD-10-CM

## 2024-01-13 DIAGNOSIS — E559 Vitamin D deficiency, unspecified: Secondary | ICD-10-CM

## 2024-01-13 DIAGNOSIS — C531 Malignant neoplasm of exocervix: Secondary | ICD-10-CM | POA: Diagnosis not present

## 2024-01-13 DIAGNOSIS — I890 Lymphedema, not elsewhere classified: Secondary | ICD-10-CM

## 2024-01-13 DIAGNOSIS — E785 Hyperlipidemia, unspecified: Secondary | ICD-10-CM

## 2024-01-13 DIAGNOSIS — R7303 Prediabetes: Secondary | ICD-10-CM

## 2024-01-13 NOTE — Telephone Encounter (Signed)
 Please schedule annual check up with fasting labs prior after her MWV on 01/20/24

## 2024-01-13 NOTE — Telephone Encounter (Signed)
-----   Message from Veva JINNY Ferrari sent at 01/13/2024  2:34 PM EDT ----- Regarding: Lab orders for Savannah Hill, 10.29.25 Patient is scheduled for CPX labs, please order future labs, Thanks , Veva

## 2024-01-13 NOTE — Therapy (Signed)
 OUTPATIENT PHYSICAL THERAPY  LOWER EXTREMITY ONCOLOGY TREATMENT  Patient Name: Savannah Hill MRN: [id number] DOB:12/10/1950, 73 y.o., female Today's Date: 01/13/2024  END OF SESSION:  PT End of Session - 01/13/24 1100     Visit Number 12    Number of Visits 24    Date for Recertification  01/15/24    PT Start Time 1100    PT Stop Time 1200    PT Time Calculation (min) 60 min    Activity Tolerance Patient tolerated treatment well    Behavior During Therapy [facility] for tasks assessed/performed          Past Medical History:  Diagnosis Date   Anemia    borderline   Atrial fibrillation (HCC)    a. s/p ablation on 10/05/2016   Cancer (HCC)    cervical/rad hysterectomy/bso wiht chemo for small cell ca   Dyspnea    Heart valve disorder    HLD (hyperlipidemia)    Hypertension    Joint pain    Lower extremity edema    Obesity    Palpitations    Past Surgical History:  Procedure Laterality Date   ABLATION OF DYSRHYTHMIC FOCUS  10/05/2016   ATRIAL FIBRILLATION ABLATION N/A 10/05/2016   Procedure: Atrial Fibrillation Ablation;  Surgeon: Inocencio Soyla Lunger, MD;  Location: [facility] INVASIVE CV LAB;  Service: Cardiovascular;  Laterality: N/A;   ATRIAL FIBRILLATION ABLATION N/A 12/27/2023   Procedure: ATRIAL FIBRILLATION ABLATION;  Surgeon: Inocencio Soyla Lunger, MD;  Location: [facility] INVASIVE CV [facility];  Service: Cardiovascular;  Laterality: N/A;   IR RADIOLOGY PERIPHERAL GUIDED IV START  09/28/2016   IR US  GUIDE VASC ACCESS RIGHT  09/28/2016   RADICAL ABDOMINAL HYSTERECTOMY  1986   with BSO   REPLACEMENT TOTAL KNEE Right 2022   TEE WITHOUT CARDIOVERSION N/A 06/07/2016   Procedure: TRANSESOPHAGEAL ECHOCARDIOGRAM (TEE);  Surgeon: Jerel Balding, MD;  Location: [facility] ENDOSCOPY;  Service: Cardiovascular;  Laterality: N/A;   Patient Active Problem List   Diagnosis Date Noted   History of CVA (cerebrovascular accident) 05/28/2021   Hyperlipidemia 06/17/2019   Class 1 obesity due to excess  calories with serious comorbidity and body mass index (BMI) of 32.0 to 32.9 in adult 12/05/2018   Postmenopausal 12/05/2018   Family history of diabetes mellitus in father 12/05/2018   Impaired fasting glucose 12/05/2018   Small cell carcinoma (HCC)- of the cervix 12/05/2018   Hip pain, bilateral 05/15/2018   Essential hypertension 01/29/2018   Chronic insomnia 01/29/2018   MDD (major depressive disorder), single episode 01/29/2018   Excessive drinking of alcohol  01/29/2018   Chronic anticoagulation 10/06/2016   Mitral valve disease    PAF (paroxysmal atrial fibrillation) (HCC) 07/01/2013   LAFB (left anterior fascicular block) 07/01/2013   Lymphedema 07/01/2013   h/o Cervical cancer 10/01/2011   H/O total hysterectomy 10/01/2011    PCP:   REFERRING PROVIDER: Ronal Pinal, MD  REFERRING DIAG: Right LE Lymphedema  THERAPY DIAG:  Malignant neoplasm of exocervix (HCC)  Lymphedema, not elsewhere classified  S/P total hysterectomy  ONSET DATE: April 2025 exacerbation  Rationale for Evaluation and Treatment: Rehabilitation  SUBJECTIVE:  SUBJECTIVE STATEMENT:   I wore the wrap Saturday and took it off yesterday am, and put the stocking on. Last night I used the Flexi touch, but it was putting too much pressure at the groin areas so I stopped. This am my leg looked good.    EVAL I have not been wearing garments but I have been using the pump every night. I am not using the Flexi Touch because It doesn't fit properly since I lost 30 lbs and I don't know how to adjust it. I have been using a basic pump and it does OK and my leg reduces but by the next day swelling returns. She has an Ablation for A Fib October 10. In 2 weeks we go to the [facility]  for 10 days. Pt has old compression stockings, a  Flexi touch and another basic style pump, and a Sigvaris night garment.   PERTINENT HISTORY:  radical hysterectomy in 1986 for small cell cervical endocrine cancer with 40 pelvic lymph nodes removed with chemo, no radiation. She developed some swelling after the procedure but has noticed and increase as she has gotten older and with some weight gain. She has a totat knee June 2022 and has had an increase in lymphedema since. She has an extremity pump from [employer] that she got in June 2022 and a Flexi touch that she received in 2023.[name] She has been treated here from 02/24/2021-06/2021 PAIN:  Are you having pain? No, occasionally if I move unexpectedly  PRECAUTIONS: Right LE lymphedema, prior CVA,Anemia, cervical CA, HLD, HTN, A-Fib, s/p TKA   RED FLAGS: None   WEIGHT BEARING RESTRICTIONS: No  FALLS:  Has patient fallen in last 6 months? No  LIVING ENVIRONMENT: Lives with: lives with their spouse Lives in: House/apartment Stairs: No;    OCCUPATION: Retired  LEISURE: read, cross word puzzles  PRIOR LEVEL OF FUNCTION: Independent  PATIENT GOALS: Decrease swelling right LE   OBJECTIVE: Note: Objective measures were completed at Evaluation unless otherwise noted.  COGNITION: Overall cognitive status: Within functional limits for tasks assessed   PALPATION: No pitting edema  OBSERVATIONS / OTHER ASSESSMENTS: significant right LE swelling, non pitting, but relatively soft  SENSATION: Light touch: Deficits     POSTURE: Round shoulders, forward head  LYMPHEDEMA ASSESSMENTS:   SURGERY TYPE/DATE: 03/19/1984  NUMBER OF LYMPH NODES REMOVED: 40  CHEMOTHERAPY: yes  RADIATION:  HORMONE TREATMENT: no  INFECTIONS:   LYMPHEDEMA ASSESSMENTS:   LOWER EXTREMITY LANDMARK RIGHT eval RIGHT 11/25/2023 RIGHT 12/02/2023 RIGHT 12/18/2023 RIGHT 01/10/2024  At groin 61.9      30 cm proximal to suprapatella       20 cm proximal to suprapatella 58.2  56.3  56  10 cm proximal to  suprapatella 51.4  49.5  48  At midpatella / popliteal crease 38.1    37  30 cm proximal to floor at lateral plantar foot 41.9 40.5 38.8 39.7 40  20 cm proximal to floor at lateral plantar foot 39.2 39 35.2 37.6 36  10 cm proximal to floor at lateral plantar foot 32.7 31.4 30.0 32.3 29.9  Circumference of ankle/heel       5 cm proximal to 1st MTP joint 21.7    21.4  Across MTP joint 21.9      Around proximal great toe 8.0  7.5 7.7 7.8  (Blank rows = not tested)  LOWER EXTREMITY LANDMARK LEFT eval  At groin 59.5  30 cm proximal to suprapatella   20 cm proximal  to suprapatella 56.5  10 cm proximal to suprapatella 45.2  At midpatella / popliteal crease 34.9  30 cm proximal to floor at lateral plantar foot 37.9  20 cm proximal to floor at lateral plantar foot 30.6  10 cm proximal to floor at lateral plantar foot 22.8  Circumference of ankle/heel   5 cm proximal to 1st MTP joint 20.9  Across MTP joint 20.5  Around proximal great toe 7.3  (Blank rows = not tested)  FUNCTIONAL TESTS:    GAIT:WFL   Outcome measure:LLIS: 22%                                                                                                                            TREATMENT DATE:   01/13/2024 MLD to Rt LE: Short neck, superficial and deep abdominals, Rt axillary nodes, Rt ingiuno-axillary anastomosis, then Rt LE as follows: lateral thigh, medial to latera, 4 techniques at knee, ant and post lower leg, ankle, retro malleoli, dorsal foot/toes then retraced all steps.   Compression Bandaging to Rt LE as follows: Cocoa butter, Medium TG soft foot to thigh, Small amt of artiflex to foot and ankle and then at pop fossa, then Comprilan soft 10 cm ankle to knee, and 15 cm  comprilan knee to thigh.  Wrapped 1st 3 toes with mollelast due to swelling, 6 cm to foot Roman sandal, then 8 cm ASH pattern. 12 cm ankle to knee, then 12 cm below knee to mid thigh, and additional 12 cm knee to groin, then last 12 cm mid  shin to groin. Checked capillary refill and was good  01/10/2024 Measured Right LE MLD to Rt LE: Short neck, superficial and deep abdominals, Rt axillary nodes, Rt ingiuno-axillary anastomosis, then Rt LE as follows: lateral thigh, medial to latera, 4 techniques at knee, ant and post lower leg, ankle, retro malleoli, dorsal foot/toes then retraced all steps.   Compression Bandaging to Rt LE as follows: Cocoa butter, Medium TG soft foot to thigh, Small amt of artiflex to foot and ankle and then at pop fossa, then Comprilan soft 10 cm ankle to knee, and 15 cm  comprilan knee to thigh.  Wrapped 1st 3 toes with mollelast due to swelling, 6 cm to foot Roman sandal, then 8 cm ASH pattern. 12 cm ankle to knee, then 12 cm below knee to mid thigh, and additional 12 cm knee to groin, then last 12 cm mid shin to groin. Checked capillary refill and was good  01/08/2024 Signed pt up for measuring on Wednesday Pts leg reduced after Mondays wrap. MLD to Rt LE: Short neck, superficial and deep abdominals, Rt axillary nodes, Rt ingiuno-axillary anastomosis, then Rt LE as follows: lateral thigh, medial to latera, 4 techniques at knee, ant and post lower leg, ankle, retro malleoli, dorsal foot/toes then retraced all steps.   Compression Bandaging to Rt LE as follows: Cocoa butter, Medium TG soft foot to thigh, Small amt of artiflex to foot and  ankle and then at pop fossa, then Comprilan soft 10 cm ankle to knee, and 15 cm  comprilan knee to thigh.  Wrapped 1st 3 toes with mollelast due to swelling, 6 cm to foot Roman sandal, then 8 cm ASH pattern. 12 cm ankle to knee, then 12 cm below knee to mid thigh, and additional 12 cm knee to groin, then last 12 cm mid shin to groin. Checked capillary refill and was good Measure next   01/06/2024 Pts leg much more swollen today after going a week without compression due to ablation MLD to Rt LE: Short neck, superficial and deep abdominals, Rt axillary nodes, Rt ingiuno-axillary  anastomosis, then Rt LE as follows: lateral thigh, medial to latera, 4 techniques at knee, ant and post lower leg, ankle, retro malleoli, dorsal foot/toes then retraced all steps.   Compression Bandaging to Rt LE as follows: Cocoa butter, Medium TG soft foot to thigh, Small amt of artiflex to foot and ankle and then at pop fossa, then Comprilan soft 10 cm ankle to knee, and 15 cm  comprilan knee to thigh.  Wrapped 1st 3 toes with mollelast due to swelling, 6 cm to foot Roman sandal, then 8 cm ASH pattern. 12 cm ankle to knee, then 12 cm below knee to mid thigh, and additional 12 cm knee to groin, then last 12 cm mid shin to groin. Checked capillary refill and was good   12/30/2023 After discussion with Eward Sharps PT supervisor it was determined we should not treat pt for the remainder of this week due to possibility of increased pressure causing arterial bleeding. Cancelled appts this week, and pt will resume next week.   12/24/2023 MLD to Rt LE: Short neck, superficial and deep abdominals, Rt axillary nodes, Rt ingiuno-axillary anastomosis, then Rt LE as follows: lateral thigh, medial to latera, 4 techniques at knee, ant and post lower leg, ankle, retro malleoli, dorsal foot/toes then retraced all steps.   Compression Bandaging to Rt LE as follows: Cocoa butter, Medium TG soft foot to thigh, Small amt of artiflex to foot and ankle and then at pop fossa, then Comprilan soft 10 cm ankle to knee, and 15 cm  comprilan knee to thigh.  Wrapped 1st 3 toes with mollelast due to swelling, 6 cm to foot Roman sandal, then 8 cm ASH pattern. 12 cm ankle to knee, then 12 cm below knee to mid thigh, and additional 12 cm knee to groin, then last 12 cm mid shin to groin. Checked capillary refill and was good   12/18/2023 Measured lower leg; increased throughout secondary to pt not wearing stocking for 5/10 days while away on her trip MLD to Rt LE: Short neck, superficial and deep abdominals, Rt axillary nodes, Rt  ingiuno-axillary anastomosis, then Rt LE as follows: lateral thigh, medial to latera, 4 techniques at knee, ant and post lower leg, ankle, retro malleoli, dorsal foot/toes then retraced all steps.   Compression Bandaging to Rt LE as follows: Cocoa butter, Medium TG soft foot to thigh, Small amt of artiflex to foot and ankle and then at pop fossa, then Comprilan soft 10 cm ankle to knee, and 15 cm  comprilan knee to thigh.  Wrapped 1st 3 toes with mollelast due to swelling, 6 cm to foot Roman sandal, then 8 cm ASH pattern. 12 cm ankle to knee, then 12 cm below knee to mid thigh, and additional 12 cm knee to groin, then last 12 cm mid shin to groin. Checked capillary refill and  was good  12/05/2023 Manual Therapy MLD to Rt LE: Short neck, superficial and deep abdominals, Rt axillary nodes, Rt ingiuno-axillary anastomosis, then Rt LE as follows: lateral thigh, medial to latera, 4 techniques at knee, ant and post lower leg, ankle, retro malleoli, dorsal foot/toes then retraced all steps.   Compression Bandaging to Rt LE as follows: Cocoa butter, Medium TG soft foot to thigh, Small amt of artiflex to foot and ankle and then at pop fossa, then Comprilan soft 10 cm ankle to knee, and 15 cm  comprilan knee to thigh.  Wrapped 1st 3 toes with mollelast due to swelling, 6 cm to foot Roman sandal, then 8 cm ASH pattern. 12 cm ankle to knee, then 12 cm below knee to mid thigh, and additional 12 cm knee to groin, then last 12 cm mid shin to groin. Checked capillary refill and was good.   12/02/2023 Pt measured with excellent results despite being in stocking today Manual Therapy MLD to Rt LE: Short neck, superficial and deep abdominals, Rt axillary nodes, Rt ingiuno-axillary anastomosis, then Rt LE as follows: lateral thigh, medial to latera, 4 techniques at knee, ant and post lower leg, ankle, retro malleoli, dorsal foot/toes then retraced all steps.   Compression Bandaging to Rt LE as follows: Cocoa butter, Medium TG  soft foot to thigh, Small amt of artiflex to foot and ankle and then at pop fossa, then Comprilan soft 10 cm ankle to knee, and 15 cm  comprilan knee to thigh.  Wrapped 1st 3 toes with mollelast due to swelling, 6 cm to foot Roman sandal, then 8 cm ASH pattern. 12 cm ankle to knee, then 12 cm below knee to mid thigh, and additional 12 cm knee to groin, then last 12 cm mid shin to groin. Checked capillary refill and was good.     11/29/2023 Manual Therapy MLD to Rt LE: Short neck, superficial and deep abdominals, Rt axillary nodes, Rt ingiuno-axillary anastomosis, then Rt LE as follows: lateral thigh, medial to latera, 4 techniques at knee, ant and post lower leg, ankle, retro malleoli, dorsal foot/toes then retraced all steps.   Compression Bandaging to Rt LE as follows: Cocoa butter, Medium TG soft foot to thigh, Small amt of artiflex to foot and ankle and then at pop fossa, then Comprilan soft 10 cm ankle to knee, and 15 cm  comprilan knee to thigh.  Wrapped 1st 3 toes with mollelast due to swelling, 6 cm to foot Roman sandal, then 8 cm ASH pattern. 12 cm ankle to knee, then 12 cm below knee to mid thigh, and additional 12 cm knee to groin, then last 12 cm mid shin to groin. Checked capillary refill and was good.    11/27/23: Manual Therapy MLD to Rt LE: Short neck, superficial and deep abdominals, Rt axillary nodes, Rt ingiuno-axillary anastomosis, then Rt LE as follows: lateral thigh, medial to latera, 4 techniques at knee, ant and post lower leg, ankle, retro malleoli, dorsal foot/toes then retraced all steps. While performing instructed pt in importance of her continuing self MLD at home. But when bandages on only needs to focus on pretreat, axillary nodes and anastomosis as done today. Reviewed correct technique and directionality.  Compression Bandaging to Rt LE as follows: Cocoa butter, Medium TG soft foot to thigh, Small amt of artiflex to foot and ankle and then at pop fossa, then Comprilan  soft 10 cm ankle to knee, and 15 cm  comprilan knee to thigh.  6 cm to foot  Roman sandal, then 8 cm ASH pattern. 12 cm ankle to knee, then 12 cm below knee to mid thigh, and additional 12 cm knee to groin, then last 12 cm mid shin to groin. Pts husband did the 6 cm and cuing offered for reinforcement throughout, mostly not to pull bandage but instead offer constant tension throughout. Checked capillary refill and was good. Assisted pt with donning on her velcro sandals. Self Care Instructed pts husband when he was applying 6 cm bandage, see above. Then at end of session explained difference between Active phase of CDT treatment, which we are doing now, then we work to transition to the Maintenance Phase with her new flat knit garment and then they don't have to cont bandaging IF she remains compliant and consistent with daily wear of stocking.   11/25/2023 Measured lower leg with some reduction since wearing stocking Pts husband instructed in compression bandaging . Instructed first by therapist and then pts husband demonstrated. Therapist applied Medium TG soft foot to thigh, Small amt of artiflex to foot and ankle, then Comprilan soft 10 cm ankle to knee, and 15 cm  comprilan knee to thigh. 6 cm to foot with figure 8's, then 8 cm with heel locks and circles up leg. 12 cm mid leg to knee, then 12 cm below knee to mid thigh, and additional 12 cm knee to groin. Pts husband videotaped therapist wrapping, and also performed 6 cm and 8 cm wrap 2 x's each and observed therapist do the remainder due to time. Checked capillary refill and was good. Pt put on her velcro sandals. Left out toe wraps at pt requuest.  11/20/2023   PATIENT EDUCATION:  Education details: POC, LOS, TREATMENT interventions; Twyla Mort name to call re: adjusting Flexi, Discussed compression stockings maintain, vs bandaging reduces and is very impt part of CDT in addition to MLD or pump. Person educated: Patient and Spouse Education  method: Explanation Education comprehension: verbalized understanding  HOME EXERCISE PROGRAM:   ASSESSMENT:  CLINICAL IMPRESSION:  Pts leg continues to look very good. Overall swelling reduced. EVAL Patient is a 73 y.o. female who was seen today for physical therapy evaluation and treatment for progression of her right  LE lymphedema. . She has lymphostatic lymphedema with congestion and hyperkeratosis. She has been treated for Right LE lymphedema most recently 2 years ago and has a Flexi touch that no longer fits properly due to 30 lb wt. Loss.  Her husband was previously independent in compression bandaging, and is willing to help his wife but will need review. We discussed importance of compression to reduce her leg as she questioned why her leg continues to swell despite using a pump..She has compression stockings but has not been wearing them. She will benefit from skilled therapy to address deficits and return pt to a more fxl lifestyle.  OBJECTIVE IMPAIRMENTS: decreased activity tolerance, decreased knowledge of condition, increased edema, and postural dysfunction.   ACTIVITY LIMITATIONS: locomotion level  PARTICIPATION LIMITATIONS: no restrictions  PERSONAL FACTORS: chronic lymphedema, abdominal surgery for cervical cancer removal with 40 lymph nodes removed, right total knee in June 2022.  are also affecting patient's functional outcome.   REHAB POTENTIAL: Good  CLINICAL DECISION MAKING: Stable/uncomplicated  EVALUATION COMPLEXITY: Low   GOALS: Goals reviewed with patient? Yes  SHORT TERM GOALS: Target date: 12/18/2023 // Pt/ husband will be able to do self compression bandaging  Baseline: Goal status: INITIAL  2.  Pts husband will perform MLD or may use compression pump  with MLD prn Baseline:  Goal status: INITIAL  3.  Pt will reduce at 10 cm prox to suprapatella by 1.5 cm Baseline:  Goal status: MET 4.  Pt will reduce at 20 cm prox to floor by 2 cm Baseline:   Goal status: MET  5.  Pt will call flexi touch and will have garment refit due to her wt. loss Baseline:  Goal status: MET LONG TERM GOALS: Target date: 01/15/2024  Pt will have appropriate day  time garments to control lymphedema Baseline:  Goal status: INITIAL  2.  Pt reports she knows how to control her lymphedema at home with lymph drainage manually or with pump, compression and exercise  Baseline:  Goal status: INITIAL  3.  Pt pt will reduce at 10 cm prox to patella by 3 cm to demonstrate improved swelling Baseline:  Goal status: MET  4.  Pt will reduce at 20 cm prox to floor by 4.5 cmt o demonstrate improved swelling Baseline:  Goal status: INITIAL  PLAN:  PT FREQUENCY: 3x/week  PT DURATION: 8 weeks  PLANNED INTERVENTIONS: [id number]- PT Re-evaluation, 97110-Therapeutic exercises, 97535- Self Care, [id number]- Manual therapy, 97760- Orthotic Initial, [id number]- Orthotic/Prosthetic subsequent, Manual lymph drainage, and Compression bandaging  PLAN FOR NEXT SESSION: Cont to instruct pts husband in wrapping,   MLD, measure weekly. Measure for garment on return if leg looks good.   Grayce JINNY Sheldon, PT 01/13/2024, 12:22 PM

## 2024-01-15 ENCOUNTER — Ambulatory Visit: Payer: Self-pay | Admitting: Family Medicine

## 2024-01-15 ENCOUNTER — Ambulatory Visit: Payer: Self-pay

## 2024-01-15 ENCOUNTER — Other Ambulatory Visit

## 2024-01-15 DIAGNOSIS — E559 Vitamin D deficiency, unspecified: Secondary | ICD-10-CM | POA: Diagnosis not present

## 2024-01-15 DIAGNOSIS — E785 Hyperlipidemia, unspecified: Secondary | ICD-10-CM

## 2024-01-15 DIAGNOSIS — C531 Malignant neoplasm of exocervix: Secondary | ICD-10-CM

## 2024-01-15 DIAGNOSIS — R7303 Prediabetes: Secondary | ICD-10-CM | POA: Diagnosis not present

## 2024-01-15 DIAGNOSIS — I890 Lymphedema, not elsewhere classified: Secondary | ICD-10-CM

## 2024-01-15 DIAGNOSIS — Z90710 Acquired absence of both cervix and uterus: Secondary | ICD-10-CM

## 2024-01-15 DIAGNOSIS — Z23 Encounter for immunization: Secondary | ICD-10-CM | POA: Diagnosis not present

## 2024-01-15 LAB — COMPREHENSIVE METABOLIC PANEL WITH GFR
ALT: 8 U/L (ref 0–35)
AST: 19 U/L (ref 0–37)
Albumin: 3.9 g/dL (ref 3.5–5.2)
Alkaline Phosphatase: 56 U/L (ref 39–117)
BUN: 11 mg/dL (ref 6–23)
CO2: 29 meq/L (ref 19–32)
Calcium: 9 mg/dL (ref 8.4–10.5)
Chloride: 102 meq/L (ref 96–112)
Creatinine, Ser: 0.85 mg/dL (ref 0.40–1.20)
GFR: 68.14 mL/min (ref >60.00)
Glucose, Bld: 90 mg/dL (ref 70–99)
Potassium: 4.1 meq/L (ref 3.5–5.1)
Sodium: 137 meq/L (ref 135–145)
Total Bilirubin: 0.7 mg/dL (ref 0.2–1.2)
Total Protein: 6.5 g/dL (ref 6.0–8.3)

## 2024-01-15 LAB — LIPID PANEL
Cholesterol: 160 mg/dL (ref 0–200)
HDL: 67.1 mg/dL (ref >39.00)
LDL Cholesterol: 82 mg/dL (ref 0–99)
NonHDL: 93.22
Total CHOL/HDL Ratio: 2
Triglycerides: 56 mg/dL (ref 0.0–149.0)
VLDL: 11.2 mg/dL (ref 0.0–40.0)

## 2024-01-15 LAB — HEMOGLOBIN A1C: Hgb A1c MFr Bld: 5.3 % (ref 4.6–6.5)

## 2024-01-15 LAB — VITAMIN D 25 HYDROXY (VIT D DEFICIENCY, FRACTURES): VITD: 49.26 ng/mL (ref 30.00–100.00)

## 2024-01-15 NOTE — Progress Notes (Signed)
 No critical labs need to be addressed urgently. We will discuss labs in detail at upcoming office visit.

## 2024-01-15 NOTE — Therapy (Signed)
 OUTPATIENT PHYSICAL THERAPY  LOWER EXTREMITY ONCOLOGY TREATMENT  Patient Name: Savannah Hill MRN: [id number] DOB:12/06/1950, 73 y.o., female Today's Date: 01/15/2024  END OF SESSION:  PT End of Session - 01/15/24 1456     Visit Number 25    Number of Visits 37    Date for Recertification  02/26/24    PT Start Time 1500    PT Stop Time 1602    PT Time Calculation (min) 62 min    Activity Tolerance Patient tolerated treatment well    Behavior During Therapy [name] for tasks assessed/performed          Past Medical History:  Diagnosis Date   Anemia    borderline   Atrial fibrillation (HCC)    a. s/p ablation on 10/05/2016   Cancer (HCC)    cervical/rad hysterectomy/bso wiht chemo for small cell ca   Dyspnea    Heart valve disorder    HLD (hyperlipidemia)    Hypertension    Joint pain    Lower extremity edema    Obesity    Palpitations    Past Surgical History:  Procedure Laterality Date   ABLATION OF DYSRHYTHMIC FOCUS  10/05/2016   ATRIAL FIBRILLATION ABLATION N/A 10/05/2016   Procedure: Atrial Fibrillation Ablation;  Surgeon: Savannah Soyla Lunger, MD;  Location: [facility] INVASIVE CV LAB;  Service: Cardiovascular;  Laterality: N/A;   ATRIAL FIBRILLATION ABLATION N/A 12/27/2023   Procedure: ATRIAL FIBRILLATION ABLATION;  Surgeon: Savannah Soyla Lunger, MD;  Location: [facility] INVASIVE CV LAB;  Service: Cardiovascular;  Laterality: N/A;   IR RADIOLOGY PERIPHERAL GUIDED IV START  09/28/2016   IR US  GUIDE VASC ACCESS RIGHT  09/28/2016   RADICAL ABDOMINAL HYSTERECTOMY  1986   with BSO   REPLACEMENT TOTAL KNEE Right 2022   TEE WITHOUT CARDIOVERSION N/A 06/07/2016   Procedure: TRANSESOPHAGEAL ECHOCARDIOGRAM (TEE);  Surgeon: Savannah Balding, MD;  Location: [facility] ENDOSCOPY;  Service: Cardiovascular;  Laterality: N/A;   Patient Active Problem List   Diagnosis Date Noted   History of CVA (cerebrovascular accident) 05/28/2021   Hyperlipidemia 06/17/2019   Class 1 obesity due to excess  calories with serious comorbidity and body mass index (BMI) of 32.0 to 32.9 in adult 12/05/2018   Postmenopausal 12/05/2018   Family history of diabetes mellitus in father 12/05/2018   Impaired fasting glucose 12/05/2018   Small cell carcinoma (HCC)- of the cervix 12/05/2018   Hip pain, bilateral 05/15/2018   Essential hypertension 01/29/2018   Chronic insomnia 01/29/2018   MDD (major depressive disorder), single episode 01/29/2018   Excessive drinking of alcohol  01/29/2018   Chronic anticoagulation 10/06/2016   Mitral valve disease    PAF (paroxysmal atrial fibrillation) (HCC) 07/01/2013   LAFB (left anterior fascicular block) 07/01/2013   Lymphedema 07/01/2013   h/o Cervical cancer 10/01/2011   H/O total hysterectomy 10/01/2011    PCP:   REFERRING PROVIDER: Ronal Pinal, MD  REFERRING DIAG: Right LE Lymphedema  THERAPY DIAG:  Malignant neoplasm of exocervix (HCC)  Lymphedema, not elsewhere classified  S/P total hysterectomy  ONSET DATE: April 2025 exacerbation  Rationale for Evaluation and Treatment: Rehabilitation  SUBJECTIVE:  SUBJECTIVE STATEMENT:   Got measured for stocking today. The last wrap stayed on well.  EVAL I have not been wearing garments but I have been using the pump every night. I am not using the Flexi Touch because It doesn't fit properly since I lost 30 lbs and I don't know how to adjust it. I have been using a basic pump and it does OK and my leg reduces but by the next day swelling returns. She has an Ablation for A Fib October 10. In 2 weeks we go to the [facility]  for 10 days. Pt has old compression stockings, a Flexi touch and another basic style pump, and a Sigvaris night garment.   PERTINENT HISTORY:  radical hysterectomy in 1986 for small cell cervical  endocrine cancer with 40 pelvic lymph nodes removed with chemo, no radiation. She developed some swelling after the procedure but has noticed and increase as she has gotten older and with some weight gain. She has a totat knee June 2022 and has had an increase in lymphedema since. She has an extremity pump from [employer] that she got in June 2022 and a Flexi touch that she received in 2023.[name] She has been treated here from 02/24/2021-06/2021 PAIN:  Are you having pain? No, occasionally if I move unexpectedly  PRECAUTIONS: Right LE lymphedema, prior CVA,Anemia, cervical CA, HLD, HTN, A-Fib, s/p TKA   RED FLAGS: None   WEIGHT BEARING RESTRICTIONS: No  FALLS:  Has patient fallen in last 6 months? No  LIVING ENVIRONMENT: Lives with: lives with their spouse Lives in: House/apartment Stairs: No;    OCCUPATION: Retired  LEISURE: read, cross word puzzles  PRIOR LEVEL OF FUNCTION: Independent  PATIENT GOALS: Decrease swelling right LE   OBJECTIVE: Note: Objective measures were completed at Evaluation unless otherwise noted.  COGNITION: Overall cognitive status: Within functional limits for tasks assessed   PALPATION: No pitting edema  OBSERVATIONS / OTHER ASSESSMENTS: significant right LE swelling, non pitting, but relatively soft  SENSATION: Light touch: Deficits     POSTURE: Round shoulders, forward head  LYMPHEDEMA ASSESSMENTS:   SURGERY TYPE/DATE: 03/19/1984  NUMBER OF LYMPH NODES REMOVED: 40  CHEMOTHERAPY: yes  RADIATION:  HORMONE TREATMENT: no  INFECTIONS:   LYMPHEDEMA ASSESSMENTS:   LOWER EXTREMITY LANDMARK RIGHT eval RIGHT 11/25/2023 RIGHT 12/02/2023 RIGHT 12/18/2023 RIGHT 01/10/2024  At groin 61.9      30 cm proximal to suprapatella       20 cm proximal to suprapatella 58.2  56.3  56  10 cm proximal to suprapatella 51.4  49.5  48  At midpatella / popliteal crease 38.1    37  30 cm proximal to floor at lateral plantar foot 41.9 40.5 38.8 39.7 40  20 cm  proximal to floor at lateral plantar foot 39.2 39 35.2 37.6 36  10 cm proximal to floor at lateral plantar foot 32.7 31.4 30.0 32.3 29.9  Circumference of ankle/heel       5 cm proximal to 1st MTP joint 21.7    21.4  Across MTP joint 21.9      Around proximal great toe 8.0  7.5 7.7 7.8  (Blank rows = not tested)  LOWER EXTREMITY LANDMARK LEFT eval  At groin 59.5  30 cm proximal to suprapatella   20 cm proximal to suprapatella 56.5  10 cm proximal to suprapatella 45.2  At midpatella / popliteal crease 34.9  30 cm proximal to floor at lateral plantar foot 37.9  20 cm proximal to floor  at lateral plantar foot 30.6  10 cm proximal to floor at lateral plantar foot 22.8  Circumference of ankle/heel   5 cm proximal to 1st MTP joint 20.9  Across MTP joint 20.5  Around proximal great toe 7.3  (Blank rows = not tested)  FUNCTIONAL TESTS:    GAIT:WFL   Outcome measure:LLIS: 22%                                                                                                                            TREATMENT DATE:   01/15/2024  MLD to Rt LE: Short neck, superficial and deep abdominals, Rt axillary nodes, Rt ingiuno-axillary anastomosis, then Rt LE as follows: lateral thigh, medial to latera, 4 techniques at knee, ant and post lower leg, ankle, retro malleoli, dorsal foot/toes then retraced all steps.   Compression Bandaging to Rt LE as follows: Cocoa butter, Medium TG soft foot to thigh, Small amt of artiflex to foot and ankle and then at pop fossa, then Comprilan soft 10 cm ankle to knee, and 15 cm  comprilan knee to thigh.  Wrapped 1st 3 toes with mollelast due to swelling, 6 cm to foot Roman sandal, then 8 cm ASH pattern. 12 cm ankle to knee, then 12 cm below knee to mid thigh, and additional 12 cm knee to groin, then last 12 cm mid shin to groin. Checked capillary refill and was good Pt measured for compression garment today  01/13/2024 MLD to Rt LE: Short neck, superficial and  deep abdominals, Rt axillary nodes, Rt ingiuno-axillary anastomosis, then Rt LE as follows: lateral thigh, medial to latera, 4 techniques at knee, ant and post lower leg, ankle, retro malleoli, dorsal foot/toes then retraced all steps.   Compression Bandaging to Rt LE as follows: Cocoa butter, Medium TG soft foot to thigh, Small amt of artiflex to foot and ankle and then at pop fossa, then Comprilan soft 10 cm ankle to knee, and 15 cm  comprilan knee to thigh.  Wrapped 1st 3 toes with mollelast due to swelling, 6 cm to foot Roman sandal, then 8 cm ASH pattern. 12 cm ankle to knee, then 12 cm below knee to mid thigh, and additional 12 cm knee to groin, then last 12 cm mid shin to groin. Checked capillary refill and was good  01/10/2024 Measured Right LE MLD to Rt LE: Short neck, superficial and deep abdominals, Rt axillary nodes, Rt ingiuno-axillary anastomosis, then Rt LE as follows: lateral thigh, medial to latera, 4 techniques at knee, ant and post lower leg, ankle, retro malleoli, dorsal foot/toes then retraced all steps.   Compression Bandaging to Rt LE as follows: Cocoa butter, Medium TG soft foot to thigh, Small amt of artiflex to foot and ankle and then at pop fossa, then Comprilan soft 10 cm ankle to knee, and 15 cm  comprilan knee to thigh.  Wrapped 1st 3 toes with mollelast due to swelling, 6 cm to foot Roman sandal, then 8  cm ASH pattern. 12 cm ankle to knee, then 12 cm below knee to mid thigh, and additional 12 cm knee to groin, then last 12 cm mid shin to groin. Checked capillary refill and was good  01/08/2024 Signed pt up for measuring on Wednesday Pts leg reduced after Mondays wrap. MLD to Rt LE: Short neck, superficial and deep abdominals, Rt axillary nodes, Rt ingiuno-axillary anastomosis, then Rt LE as follows: lateral thigh, medial to latera, 4 techniques at knee, ant and post lower leg, ankle, retro malleoli, dorsal foot/toes then retraced all steps.   Compression Bandaging to Rt LE  as follows: Cocoa butter, Medium TG soft foot to thigh, Small amt of artiflex to foot and ankle and then at pop fossa, then Comprilan soft 10 cm ankle to knee, and 15 cm  comprilan knee to thigh.  Wrapped 1st 3 toes with mollelast due to swelling, 6 cm to foot Roman sandal, then 8 cm ASH pattern. 12 cm ankle to knee, then 12 cm below knee to mid thigh, and additional 12 cm knee to groin, then last 12 cm mid shin to groin. Checked capillary refill and was good Measure next   01/06/2024 Pts leg much more swollen today after going a week without compression due to ablation MLD to Rt LE: Short neck, superficial and deep abdominals, Rt axillary nodes, Rt ingiuno-axillary anastomosis, then Rt LE as follows: lateral thigh, medial to latera, 4 techniques at knee, ant and post lower leg, ankle, retro malleoli, dorsal foot/toes then retraced all steps.   Compression Bandaging to Rt LE as follows: Cocoa butter, Medium TG soft foot to thigh, Small amt of artiflex to foot and ankle and then at pop fossa, then Comprilan soft 10 cm ankle to knee, and 15 cm  comprilan knee to thigh.  Wrapped 1st 3 toes with mollelast due to swelling, 6 cm to foot Roman sandal, then 8 cm ASH pattern. 12 cm ankle to knee, then 12 cm below knee to mid thigh, and additional 12 cm knee to groin, then last 12 cm mid shin to groin. Checked capillary refill and was good   12/30/2023 After discussion with Eward Sharps PT supervisor it was determined we should not treat pt for the remainder of this week due to possibility of increased pressure causing arterial bleeding. Cancelled appts this week, and pt will resume next week.   12/24/2023 MLD to Rt LE: Short neck, superficial and deep abdominals, Rt axillary nodes, Rt ingiuno-axillary anastomosis, then Rt LE as follows: lateral thigh, medial to latera, 4 techniques at knee, ant and post lower leg, ankle, retro malleoli, dorsal foot/toes then retraced all steps.   Compression Bandaging to Rt LE  as follows: Cocoa butter, Medium TG soft foot to thigh, Small amt of artiflex to foot and ankle and then at pop fossa, then Comprilan soft 10 cm ankle to knee, and 15 cm  comprilan knee to thigh.  Wrapped 1st 3 toes with mollelast due to swelling, 6 cm to foot Roman sandal, then 8 cm ASH pattern. 12 cm ankle to knee, then 12 cm below knee to mid thigh, and additional 12 cm knee to groin, then last 12 cm mid shin to groin. Checked capillary refill and was good   12/18/2023 Measured lower leg; increased throughout secondary to pt not wearing stocking for 5/10 days while away on her trip MLD to Rt LE: Short neck, superficial and deep abdominals, Rt axillary nodes, Rt ingiuno-axillary anastomosis, then Rt LE as follows: lateral thigh,  medial to latera, 4 techniques at knee, ant and post lower leg, ankle, retro malleoli, dorsal foot/toes then retraced all steps.   Compression Bandaging to Rt LE as follows: Cocoa butter, Medium TG soft foot to thigh, Small amt of artiflex to foot and ankle and then at pop fossa, then Comprilan soft 10 cm ankle to knee, and 15 cm  comprilan knee to thigh.  Wrapped 1st 3 toes with mollelast due to swelling, 6 cm to foot Roman sandal, then 8 cm ASH pattern. 12 cm ankle to knee, then 12 cm below knee to mid thigh, and additional 12 cm knee to groin, then last 12 cm mid shin to groin. Checked capillary refill and was good  12/05/2023 Manual Therapy MLD to Rt LE: Short neck, superficial and deep abdominals, Rt axillary nodes, Rt ingiuno-axillary anastomosis, then Rt LE as follows: lateral thigh, medial to latera, 4 techniques at knee, ant and post lower leg, ankle, retro malleoli, dorsal foot/toes then retraced all steps.   Compression Bandaging to Rt LE as follows: Cocoa butter, Medium TG soft foot to thigh, Small amt of artiflex to foot and ankle and then at pop fossa, then Comprilan soft 10 cm ankle to knee, and 15 cm  comprilan knee to thigh.  Wrapped 1st 3 toes with mollelast due  to swelling, 6 cm to foot Roman sandal, then 8 cm ASH pattern. 12 cm ankle to knee, then 12 cm below knee to mid thigh, and additional 12 cm knee to groin, then last 12 cm mid shin to groin. Checked capillary refill and was good.   12/02/2023 Pt measured with excellent results despite being in stocking today Manual Therapy MLD to Rt LE: Short neck, superficial and deep abdominals, Rt axillary nodes, Rt ingiuno-axillary anastomosis, then Rt LE as follows: lateral thigh, medial to latera, 4 techniques at knee, ant and post lower leg, ankle, retro malleoli, dorsal foot/toes then retraced all steps.   Compression Bandaging to Rt LE as follows: Cocoa butter, Medium TG soft foot to thigh, Small amt of artiflex to foot and ankle and then at pop fossa, then Comprilan soft 10 cm ankle to knee, and 15 cm  comprilan knee to thigh.  Wrapped 1st 3 toes with mollelast due to swelling, 6 cm to foot Roman sandal, then 8 cm ASH pattern. 12 cm ankle to knee, then 12 cm below knee to mid thigh, and additional 12 cm knee to groin, then last 12 cm mid shin to groin. Checked capillary refill and was good.     11/29/2023 Manual Therapy MLD to Rt LE: Short neck, superficial and deep abdominals, Rt axillary nodes, Rt ingiuno-axillary anastomosis, then Rt LE as follows: lateral thigh, medial to latera, 4 techniques at knee, ant and post lower leg, ankle, retro malleoli, dorsal foot/toes then retraced all steps.   Compression Bandaging to Rt LE as follows: Cocoa butter, Medium TG soft foot to thigh, Small amt of artiflex to foot and ankle and then at pop fossa, then Comprilan soft 10 cm ankle to knee, and 15 cm  comprilan knee to thigh.  Wrapped 1st 3 toes with mollelast due to swelling, 6 cm to foot Roman sandal, then 8 cm ASH pattern. 12 cm ankle to knee, then 12 cm below knee to mid thigh, and additional 12 cm knee to groin, then last 12 cm mid shin to groin. Checked capillary refill and was good.    11/27/23: Manual  Therapy MLD to Rt LE: Short neck, superficial and deep  abdominals, Rt axillary nodes, Rt ingiuno-axillary anastomosis, then Rt LE as follows: lateral thigh, medial to latera, 4 techniques at knee, ant and post lower leg, ankle, retro malleoli, dorsal foot/toes then retraced all steps. While performing instructed pt in importance of her continuing self MLD at home. But when bandages on only needs to focus on pretreat, axillary nodes and anastomosis as done today. Reviewed correct technique and directionality.  Compression Bandaging to Rt LE as follows: Cocoa butter, Medium TG soft foot to thigh, Small amt of artiflex to foot and ankle and then at pop fossa, then Comprilan soft 10 cm ankle to knee, and 15 cm  comprilan knee to thigh.  6 cm to foot Roman sandal, then 8 cm ASH pattern. 12 cm ankle to knee, then 12 cm below knee to mid thigh, and additional 12 cm knee to groin, then last 12 cm mid shin to groin. Pts husband did the 6 cm and cuing offered for reinforcement throughout, mostly not to pull bandage but instead offer constant tension throughout. Checked capillary refill and was good. Assisted pt with donning on her velcro sandals. Self Care Instructed pts husband when he was applying 6 cm bandage, see above. Then at end of session explained difference between Active phase of CDT treatment, which we are doing now, then we work to transition to the Maintenance Phase with her new flat knit garment and then they don't have to cont bandaging IF she remains compliant and consistent with daily wear of stocking.   11/25/2023 Measured lower leg with some reduction since wearing stocking Pts husband instructed in compression bandaging . Instructed first by therapist and then pts husband demonstrated. Therapist applied Medium TG soft foot to thigh, Small amt of artiflex to foot and ankle, then Comprilan soft 10 cm ankle to knee, and 15 cm  comprilan knee to thigh. 6 cm to foot with figure 8's, then 8 cm with heel  locks and circles up leg. 12 cm mid leg to knee, then 12 cm below knee to mid thigh, and additional 12 cm knee to groin. Pts husband videotaped therapist wrapping, and also performed 6 cm and 8 cm wrap 2 x's each and observed therapist do the remainder due to time. Checked capillary refill and was good. Pt put on her velcro sandals. Left out toe wraps at pt requuest.  11/20/2023   PATIENT EDUCATION:  Education details: POC, LOS, TREATMENT interventions; Twyla Mort name to call re: adjusting Flexi, Discussed compression stockings maintain, vs bandaging reduces and is very impt part of CDT in addition to MLD or pump. Person educated: Patient and Spouse Education method: Explanation Education comprehension: verbalized understanding  HOME EXERCISE PROGRAM:   ASSESSMENT:  CLINICAL IMPRESSION: Pt was measured for compression garments today. She requires (2) [employer]i 350 CCL 2 Thigh high  compression stockings with open toe and wide dot silicone border to help stocking stay up. She will also require custom Circaid Profile Thigh High Night Garment with Midnight Oversleeve . Her leg is doing well, and she is able to tolerate good pressure with her compression bandage. She has been very compliant. She has achieved 4/5 STG's and 1/4 STGS, and is making progress with remaining. She will continue to benefit from skilled therapy until her garments are received. They have been ordered today. EVAL Patient is a 73 y.o. female who was seen today for physical therapy evaluation and treatment for progression of her right  LE lymphedema. . She has lymphostatic lymphedema with congestion and  hyperkeratosis. She has been treated for Right LE lymphedema most recently 2 years ago and has a Flexi touch that no longer fits properly due to 30 lb wt. Loss.  Her husband was previously independent in compression bandaging, and is willing to help his wife but will need review. We discussed importance of compression to reduce her  leg as she questioned why her leg continues to swell despite using a pump..She has compression stockings but has not been wearing them. She will benefit from skilled therapy to address deficits and return pt to a more fxl lifestyle.  OBJECTIVE IMPAIRMENTS: decreased activity tolerance, decreased knowledge of condition, increased edema, and postural dysfunction.   ACTIVITY LIMITATIONS: locomotion level  PARTICIPATION LIMITATIONS: no restrictions  PERSONAL FACTORS: chronic lymphedema, abdominal surgery for cervical cancer removal with 40 lymph nodes removed, right total knee in June 2022.  are also affecting patient's functional outcome.   REHAB POTENTIAL: Good  CLINICAL DECISION MAKING: Stable/uncomplicated  EVALUATION COMPLEXITY: Low   GOALS: Goals reviewed with patient? Yes  SHORT TERM GOALS: Target date: 12/18/2023 // Pt/ husband will be able to do self compression bandaging  Baseline: Goal status: In Progress  2.  Pts husband will perform MLD or may use compression pump with MLD prn Baseline:  Goal status: Using compression pump after removing bandages 01/15/2024 3.  Pt will reduce at 10 cm prox to suprapatella by 1.5 cm Baseline:  Goal status: MET 01/10/2024 4.  Pt will reduce at 20 cm prox to floor by 2 cm Baseline:  Goal status: MET 01/10/2024  5.  Pt will call flexi touch and will have garment refit due to her wt. loss Baseline:  Goal status: MET 01/10/2024  LONG TERM GOALS: Target date: 01/15/2024  Pt will have appropriate day  time garments to control lymphedema Baseline:  Goal status: In Progress, ordered 01/15/2024 but not received 2.  Pt reports she knows how to control her lymphedema at home with lymph drainage manually or with pump, compression and exercise  Baseline:  Goal status:In Progress  3.  Pt pt will reduce at 10 cm prox to patella by 3 cm to demonstrate improved swelling Baseline:  Goal status: MET 01/10/2024  4.  Pt will reduce at 20 cm prox  to floor by 4.5 cmt o demonstrate improved swelling Baseline:  Goal status: In Progress  PLAN:  PT FREQUENCY: 1-2x/week PT DURATION: 4-6 weeks prn  PLANNED INTERVENTIONS: [id number]- PT Re-evaluation, 97110-Therapeutic exercises, 97535- Self Care, [id number]- Manual therapy, 97760- Orthotic Initial, [id number]- Orthotic/Prosthetic subsequent, Manual lymph drainage, and Compression bandaging  PLAN FOR NEXT SESSION: Cont to instruct pts husband in wrapping,   MLD, measure weekly. Garments ordered today. Script to be faxed to MD 01/16/2024   Grayce JINNY Sheldon, PT 01/15/2024, 5:38 PM

## 2024-01-17 ENCOUNTER — Ambulatory Visit
Admission: RE | Admit: 2024-01-17 | Discharge: 2024-01-17 | Disposition: A | Discharge status: Home / Self Care | Source: Ambulatory Visit | Attending: Physician Assistant | Admitting: Physician Assistant

## 2024-01-17 VITALS — BP 123/85 | HR 69 | Temp 98.9°F | Resp 16

## 2024-01-17 DIAGNOSIS — L600 Ingrowing nail: Secondary | ICD-10-CM

## 2024-01-17 MED ORDER — AMOXICILLIN-POT CLAVULANATE 875-125 MG PO TABS: 1.0000 | ORAL_TABLET | Freq: Two times a day (BID) | ORAL | 0 refills | Status: DC

## 2024-01-17 NOTE — ED Provider Notes (Signed)
 [facility]    CSN: [id number] Arrival date & time: 01/17/24  1438      History   Chief Complaint Chief Complaint  Patient presents with   Foot Injury    Entered by patient    HPI Savannah Hill is a 73 y.o. female.  has a past medical history of Anemia, Atrial fibrillation (HCC), Cancer (HCC), Dyspnea, Heart valve disorder, HLD (hyperlipidemia), Hypertension, Joint pain, Lower extremity edema, Obesity, and Palpitations.\   HPI  Discussed the use of AI scribe software for clinical note transcription with the patient, who gave verbal consent to proceed.     Past Medical History:  Diagnosis Date   Anemia    borderline   Atrial fibrillation (HCC)    a. s/p ablation on 10/05/2016   Cancer [facility])    cervical/rad hysterectomy/bso wiht chemo for small cell ca   Dyspnea    Heart valve disorder    HLD (hyperlipidemia)    Hypertension    Joint pain    Lower extremity edema    Obesity    Palpitations     Patient Active Problem List   Diagnosis Date Noted   History of CVA (cerebrovascular accident) 05/28/2021   Hyperlipidemia 06/17/2019   Class 1 obesity due to excess calories with serious comorbidity and body mass index (BMI) of 32.0 to 32.9 in adult 12/05/2018   Postmenopausal 12/05/2018   Family history of diabetes mellitus in father 12/05/2018   Impaired fasting glucose 12/05/2018   Small cell carcinoma (HCC)- of the cervix 12/05/2018   Hip pain, bilateral 05/15/2018   Essential hypertension 01/29/2018   Chronic insomnia 01/29/2018   MDD (major depressive disorder), single episode 01/29/2018   Excessive drinking of alcohol  01/29/2018   Chronic anticoagulation 10/06/2016   Mitral valve disease    PAF (paroxysmal atrial fibrillation) (HCC) 07/01/2013   LAFB (left anterior fascicular block) 07/01/2013   Lymphedema 07/01/2013   h/o Cervical cancer 10/01/2011   H/O total hysterectomy 10/01/2011    Past Surgical History:  Procedure Laterality  Date   ABLATION OF DYSRHYTHMIC FOCUS  10/05/2016   ATRIAL FIBRILLATION ABLATION N/A 10/05/2016   Procedure: Atrial Fibrillation Ablation;  Surgeon: Inocencio Soyla Lunger, MD;  Location: [facility] INVASIVE CV LAB;  Service: Cardiovascular;  Laterality: N/A;   ATRIAL FIBRILLATION ABLATION N/A 12/27/2023   Procedure: ATRIAL FIBRILLATION ABLATION;  Surgeon: Inocencio Soyla Lunger, MD;  Location: [facility] INVASIVE CV LAB;  Service: Cardiovascular;  Laterality: N/A;   IR RADIOLOGY PERIPHERAL GUIDED IV START  09/28/2016   IR US  GUIDE VASC ACCESS RIGHT  09/28/2016   RADICAL ABDOMINAL HYSTERECTOMY  1986   with BSO   REPLACEMENT TOTAL KNEE Right 2022   TEE WITHOUT CARDIOVERSION N/A 06/07/2016   Procedure: TRANSESOPHAGEAL ECHOCARDIOGRAM (TEE);  Surgeon: Jerel Balding, MD;  Location: [facility] ENDOSCOPY;  Service: Cardiovascular;  Laterality: N/A;    OB History     Gravida  1   Para  1   Term      Preterm      AB      Living  1      SAB      IAB      Ectopic      Multiple      Live Births               Home Medications    Prior to Admission medications   Medication Sig Start Date End Date Taking? Authorizing Provider  amoxicillin -clavulanate (AUGMENTIN ) 875-125 MG tablet Take  1 tablet by mouth every 12 (twelve) hours. 01/17/24  Yes Rosamary Boudreau E, PA-C  acetaminophen  (TYLENOL ) 500 MG tablet Take 1,000 mg by mouth every 6 (six) hours as needed for moderate pain (pain score 4-6).    [provider]  ALPRAZolam  (XANAX ) 0.5 MG tablet Take 1 tablet (0.5 mg total) by mouth at bedtime as needed for anxiety. 10/31/23   Bedsole, Amy E, MD  amiodarone  (PACERONE ) 200 MG tablet Take 1 tablet (200 mg total) by mouth daily. Take 2 tablets (400 mg total) TWICE a day for 2 weeks, then take 1 tablet (200 mg total) TWICE a day for 2 weeks, then take 1 tablet (200 mg total) ONCE a day thereafter Patient not taking: Reported on 11/20/2023 11/14/23   Inocencio Soyla Lunger, MD  Bromfenac Sodium 0.07 % SOLN Place  1 drop into the right eye daily. 12/10/23   [provider]  Difluprednate 0.05 % EMUL Place 1 drop into the right eye in the morning and at bedtime. 12/21/23   [provider]  diltiazem  (CARDIZEM  CD) 120 MG 24 hr capsule Take 1 capsule (120 mg total) by mouth daily. Patient taking differently: Take 120 mg by mouth 2 (two) times daily. 12/23/23   Camnitz, Soyla Lunger, MD  DULoxetine  (CYMBALTA ) 30 MG capsule Take 1 capsule (30 mg total) by mouth daily. 10/25/23   Bedsole, Amy E, MD  ELIQUIS  5 MG TABS tablet TAKE 1 TABLET TWICE A DAY 12/16/23   Camnitz, Soyla Lunger, MD  furosemide  (LASIX ) 20 MG tablet TAKE 1 TABLET DAILY 12/30/23   Bedsole, Amy E, MD  gatifloxacin (ZYMAXID) 0.5 % SOLN Place 1 drop into the right eye 3 (three) times daily. 12/10/23   [provider]  nitrofurantoin , macrocrystal-monohydrate, (MACROBID ) 100 MG capsule Take 1 capsule (100 mg total) by mouth daily as needed. Take 1 po qd post coital Patient not taking: Reported on 12/25/2023 07/11/23   Cleotilde Ronal RAMAN, MD  predniSONE  (DELTASONE ) 20 MG tablet 3 tabs by mouth daily in AM x 3 days, then 2 tabs by mouth daily  In AM x 2 days then 1 tab by mouth daily in AM x 2 days Patient not taking: Reported on 11/20/2023 10/18/23   Avelina Greig BRAVO, MD  rosuvastatin  (CRESTOR ) 5 MG tablet TAKE 1 TABLET DAILY Patient taking differently: Take 5 mg by mouth every other day. 09/06/23   Bedsole, Amy E, MD  Semaglutide,0.25 or 0.5MG /DOS, (OZEMPIC, 0.25 OR 0.5 MG/DOSE,) 2 MG/1.5ML SOPN Inject 0.25 mg into the skin every 14 (fourteen) days.    [provider]  traZODone  (DESYREL ) 100 MG tablet TAKE 1 TABLET AT BEDTIME ASNEEDED FOR SLEEP 01/13/24   Avelina Greig BRAVO, MD    Family History Family History  Problem Relation Age of Onset   Diabetes Mother    Hypertension Mother    Heart disease Mother    Thyroid  disease Mother    Depression Mother    Alcoholism Mother    Skin cancer Father    CVA Father    Diabetes Father     Hypertension Father    Cancer Paternal Grandfather        unknown type    Social History Social History   Tobacco Use   Smoking status: Never   Smokeless tobacco: Never  Vaping Use   Vaping status: Never Used  Substance Use Topics   Alcohol  use: Yes    Alcohol /week: 10.0 standard drinks of alcohol     Types: 10 Glasses of  wine per week   Drug use: No     Allergies   Patient has no known allergies.   Review of Systems Review of Systems   Physical Exam Triage Vital Signs ED Triage Vitals  Encounter Vitals Group     BP 01/17/24 1459 123/85     Girls Systolic BP Percentile --      Girls Diastolic BP Percentile --      Boys Systolic BP Percentile --      Boys Diastolic BP Percentile --      Pulse Rate 01/17/24 1459 69     Resp 01/17/24 1459 16     Temp 01/17/24 1459 98.9 F (37.2 C)     Temp Source 01/17/24 1459 Oral     SpO2 01/17/24 1459 96 %     Weight --      Height --      Head Circumference --      Peak Flow --      Pain Score 01/17/24 1457 7     Pain Loc --      Pain Education --      Exclude from Growth Chart --    No data found.  Updated Vital Signs BP 123/85 (BP Location: Right Arm)   Pulse 69   Temp 98.9 F (37.2 C) (Oral)   Resp 16   LMP 03/19/1984   SpO2 96%   Visual Acuity Right Eye Distance:   Left Eye Distance:   Bilateral Distance:    Right Eye Near:   Left Eye Near:    Bilateral Near:     Physical Exam Vitals reviewed.  Constitutional:      General: She is awake.     Appearance: Normal appearance. She is well-developed and well-groomed.  HENT:     Head: Normocephalic and atraumatic.  Eyes:     General: Lids are normal. Gaze aligned appropriately.     Extraocular Movements: Extraocular movements intact.     Conjunctiva/sclera: Conjunctivae normal.  Pulmonary:     Effort: Pulmonary effort is normal.  Feet:     Right foot:     Skin integrity: Erythema present.     Toenail Condition: Right toenails are ingrown.      Comments: Pt has notable swelling of the left lower extremity from lymphedema   Pt has redness and severe tenderness to the distal aspect of the right great toe. She has some drainage present from under the medial aspect of the great toe nail.   Cap refill is less than 2 seconds  Neurological:     Mental Status: She is alert and oriented to person, place, and time.  Psychiatric:        Attention and Perception: Attention and perception normal.        Mood and Affect: Mood and affect normal.        Speech: Speech normal.        Behavior: Behavior normal. Behavior is cooperative.      UC Treatments / Results  Labs (all labs ordered are listed, but only abnormal results are displayed) Labs Reviewed - No data to display  EKG   Radiology No results found.  Procedures Excise Ingrown Toenail  Date/Time: 01/17/2024 3:56 PM  Performed by: Vashawn Ekstein E, PA-C Authorized by: Cielo Arias E, PA-C   Consent:    Consent obtained:  Verbal   Consent given by:  Patient   Risks, benefits, and alternatives were discussed: yes     Risks discussed:  Bleeding, incomplete removal, infection, pain and permanent nail deformity   Alternatives discussed:  Referral, observation, no treatment and alternative treatment Universal protocol:    Procedure explained and questions answered to patient or proxy's satisfaction: yes     Patient identity confirmed:  Verbally with patient Pre-procedure details:    Skin preparation:  Povidone-iodine Procedure details:    Location:  Foot   Foot location:  R big toe Anesthesia:    Anesthesia method:  Nerve block   Block location:  Dorsum of the right great toe wtih lateral sweep for adequeate nerve block   Block anesthetic:  Lidocaine  1% w/o epi   Block technique:  Dorsum of the right great toe wtih lateral sweep for adequeate nerve block   Block injection procedure:  Introduced needle   Block outcome:  Anesthesia achieved Nail Removal:    Nail removed:   Partial   Nail side:  Medial   Nail bed repaired: no   Trephination:    Subungual hematoma drained: no   Ingrown nail:    Nail matrix removed or ablated:  Partial Post-procedure details:    Dressing:  Antibiotic ointment and 4x4 sterile gauze   Procedure completion:  Tolerated well, no immediate complications  (including critical care time)  Medications Ordered in UC Medications - No data to display  Initial Impression / Assessment and Plan / UC Course  I have reviewed the triage vital signs and the nursing notes.  Pertinent labs & imaging results that were available during my care of the patient were reviewed by me and considered in my medical decision making (see chart for details).      Final Clinical Impressions(s) / UC Diagnoses   Final diagnoses:  Ingrown toenail of right foot with infection   Patient presents today with concerns for pain and discomfort at the distal aspect of the right great toe.  She reports that this started after she attempted to trim her toenails herself but is concerned for potential ingrown toenail.  She states it is particularly painful even while she was trying to sleep she noticed severe pain even with light touch.  Physical exam is notable for tenderness at the distal aspect of the right great toe.  Difficulty assessing if there is an ingrown toenail along the medial aspect of the right great toe but the patient is amenable to completing procedure for ingrown toenail removal.  Right great toe was numbed using a digital block until I was certain that area was sufficiently numb.  Medial aspect of the right great toenail was avulsed which revealed a long shard of nail that had been penetrating into the distal aspect of the toe.  There was some purulent drainage expulsed from the area of maximum tenderness.  Suspect likely small abscess formation.  Patient was amenable to having medial aspect of the nail completely removed with nailbed destruction.  Reviewed  with patient that she should use warm water soaks with Epsom salt, Aquaphor on the area for at least the next week to assist with recovery.  Reviewed home measures for further resolution.  Will start patient on Augmentin  p.o. twice daily x 7 days for abscess and infection prevention.  ED and return precautions reviewed and provided in AVS.  Follow-up as needed.    Discharge Instructions      You were seen today for concerns of an ingrown toenail on your right great toe.  They were able to remove the ingrown portion of the nail as well as  treat the nailbed to prevent further growth.  You may have some soreness and discomfort at the procedure site but there should not be severe pain.  I do recommend using Tylenol  as needed for pain control and recommend taking some when you get home as you will likely be sore once the numbing solution wears off.  Over the next few days I recommend using warm water Epsom salt foot soaks for about 10 to 15 minutes to help with discomfort and recovery.  If you notice any of the following symptoms please return here: Severe swelling of the toe, copious amounts of drainage, bleeding, severe pain, difficulty bending your toe.  You are prescribed an antibiotic called Augmentin  for you to take by mouth twice per day for 7 days.  This should help with the abscess that formed from the ingrown toenail.  Please finish the entire course of antibiotic as directed unless you develop an allergic reaction or if a medical provider tells you to stop.     ED Prescriptions     Medication Sig Dispense Auth. Provider   amoxicillin -clavulanate (AUGMENTIN ) 875-125 MG tablet Take 1 tablet by mouth every 12 (twelve) hours. 14 tablet Kristen Fromm E, PA-C      PDMP not reviewed this encounter.   Marylene Rocky BRAVO, PA-C 01/17/24 [id number]

## 2024-01-17 NOTE — Discharge Instructions (Addendum)
 You were seen today for concerns of an ingrown toenail on your right great toe.  They were able to remove the ingrown portion of the nail as well as treat the nailbed to prevent further growth.  You may have some soreness and discomfort at the procedure site but there should not be severe pain.  I do recommend using Tylenol  as needed for pain control and recommend taking some when you get home as you will likely be sore once the numbing solution wears off.  Over the next few days I recommend using warm water Epsom salt foot soaks for about 10 to 15 minutes to help with discomfort and recovery.  If you notice any of the following symptoms please return here: Severe swelling of the toe, copious amounts of drainage, bleeding, severe pain, difficulty bending your toe.  You are prescribed an antibiotic called Augmentin  for you to take by mouth twice per day for 7 days.  This should help with the abscess that formed from the ingrown toenail.  Please finish the entire course of antibiotic as directed unless you develop an allergic reaction or if a medical provider tells you to stop.

## 2024-01-17 NOTE — ED Triage Notes (Signed)
 Patient presents to UC for right big toe irritation x 2-3 days ago. States she tried cutting her toe nails herself and unsure if she cut it too short or if she has ingrown toe nail. Cleansing with peroxide and applying Aquaphor.   Denies fever or drainage.

## 2024-01-20 ENCOUNTER — Ambulatory Visit: Attending: Obstetrics & Gynecology

## 2024-01-20 DIAGNOSIS — Z90710 Acquired absence of both cervix and uterus: Secondary | ICD-10-CM | POA: Diagnosis not present

## 2024-01-20 DIAGNOSIS — C531 Malignant neoplasm of exocervix: Secondary | ICD-10-CM | POA: Insufficient documentation

## 2024-01-20 DIAGNOSIS — I890 Lymphedema, not elsewhere classified: Secondary | ICD-10-CM | POA: Insufficient documentation

## 2024-01-20 NOTE — Therapy (Signed)
 OUTPATIENT PHYSICAL THERAPY  LOWER EXTREMITY ONCOLOGY TREATMENT  Patient Name: Savannah Hill MRN: [id number] DOB:07-Dec-1950, 73 y.o., female Today's Date: 01/20/2024  END OF SESSION:  PT End of Session - 01/20/24 0905     Visit Number 26    Number of Visits 37    Date for Recertification  02/26/24    PT Start Time 0905    PT Stop Time 0958    PT Time Calculation (min) 53 min    Activity Tolerance Patient tolerated treatment well    Behavior During Therapy [facility] for tasks assessed/performed          Past Medical History:  Diagnosis Date   Anemia    borderline   Atrial fibrillation (HCC)    a. s/p ablation on 10/05/2016   Cancer (HCC)    cervical/rad hysterectomy/bso wiht chemo for small cell ca   Dyspnea    Heart valve disorder    HLD (hyperlipidemia)    Hypertension    Joint pain    Lower extremity edema    Obesity    Palpitations    Past Surgical History:  Procedure Laterality Date   ABLATION OF DYSRHYTHMIC FOCUS  10/05/2016   ATRIAL FIBRILLATION ABLATION N/A 10/05/2016   Procedure: Atrial Fibrillation Ablation;  Surgeon: Inocencio Soyla Lunger, MD;  Location: [facility] INVASIVE CV LAB;  Service: Cardiovascular;  Laterality: N/A;   ATRIAL FIBRILLATION ABLATION N/A 12/27/2023   Procedure: ATRIAL FIBRILLATION ABLATION;  Surgeon: Inocencio Soyla Lunger, MD;  Location: [facility] INVASIVE CV [facility];  Service: Cardiovascular;  Laterality: N/A;   IR RADIOLOGY PERIPHERAL GUIDED IV START  09/28/2016   IR US  GUIDE VASC ACCESS RIGHT  09/28/2016   RADICAL ABDOMINAL HYSTERECTOMY  1986   with BSO   REPLACEMENT TOTAL KNEE Right 2022   TEE WITHOUT CARDIOVERSION N/A 06/07/2016   Procedure: TRANSESOPHAGEAL ECHOCARDIOGRAM (TEE);  Surgeon: Jerel Balding, MD;  Location: [facility] ENDOSCOPY;  Service: Cardiovascular;  Laterality: N/A;   Patient Active Problem List   Diagnosis Date Noted   History of CVA (cerebrovascular accident) 05/28/2021   Hyperlipidemia 06/17/2019   Class 1 obesity due to excess  calories with serious comorbidity and body mass index (BMI) of 32.0 to 32.9 in adult 12/05/2018   Postmenopausal 12/05/2018   Family history of diabetes mellitus in father 12/05/2018   Impaired fasting glucose 12/05/2018   Small cell carcinoma (HCC)- of the cervix 12/05/2018   Hip pain, bilateral 05/15/2018   Essential hypertension 01/29/2018   Chronic insomnia 01/29/2018   MDD (major depressive disorder), single episode 01/29/2018   Excessive drinking of alcohol  01/29/2018   Chronic anticoagulation 10/06/2016   Mitral valve disease    PAF (paroxysmal atrial fibrillation) (HCC) 07/01/2013   LAFB (left anterior fascicular block) 07/01/2013   Lymphedema 07/01/2013   h/o Cervical cancer 10/01/2011   H/O total hysterectomy 10/01/2011    PCP:   REFERRING PROVIDER: Ronal Pinal, MD  REFERRING DIAG: Right LE Lymphedema  THERAPY DIAG:  Malignant neoplasm of exocervix (HCC)  Lymphedema, not elsewhere classified  S/P total hysterectomy  ONSET DATE: April 2025 exacerbation  Rationale for Evaluation and Treatment: Rehabilitation  SUBJECTIVE:  SUBJECTIVE STATEMENT  I Saw the MD on 01/17/2024 because my big toe was hurting and  was red. I thought it was an ingrown toenail. She numbed it and cut the ingrown part out and put me on antibiotics. I have been soaking my toe. There is no pain now.  EVAL I have not been wearing garments but I have been using the pump every night. I am not using the Flexi Touch because It doesn't fit properly since I lost 30 lbs and I don't know how to adjust it. I have been using a basic pump and it does OK and my leg reduces but by the next day swelling returns. She has an Ablation for A Fib October 10. In 2 weeks we go to the [facility]  for 10 days. Pt has old compression  stockings, a Flexi touch and another basic style pump, and a Sigvaris night garment.   PERTINENT HISTORY:  radical hysterectomy in 1986 for small cell cervical endocrine cancer with 40 pelvic lymph nodes removed with chemo, no radiation. She developed some swelling after the procedure but has noticed and increase as she has gotten older and with some weight gain. She has a totat knee June 2022 and has had an increase in lymphedema since. She has an extremity pump from [employer] that she got in June 2022 and a Flexi touch that she received in 2023.[name] She has been treated here from 02/24/2021-06/2021 PAIN:  Are you having pain? No, occasionally if I move unexpectedly  PRECAUTIONS: Right LE lymphedema, prior CVA,Anemia, cervical CA, HLD, HTN, A-Fib, s/p TKA   RED FLAGS: None   WEIGHT BEARING RESTRICTIONS: No  FALLS:  Has patient fallen in last 6 months? No  LIVING ENVIRONMENT: Lives with: lives with their spouse Lives in: House/apartment Stairs: No;    OCCUPATION: Retired  LEISURE: read, cross word puzzles  PRIOR LEVEL OF FUNCTION: Independent  PATIENT GOALS: Decrease swelling right LE   OBJECTIVE: Note: Objective measures were completed at Evaluation unless otherwise noted.  COGNITION: Overall cognitive status: Within functional limits for tasks assessed   PALPATION: No pitting edema  OBSERVATIONS / OTHER ASSESSMENTS: significant right LE swelling, non pitting, but relatively soft  SENSATION: Light touch: Deficits     POSTURE: Round shoulders, forward head  LYMPHEDEMA ASSESSMENTS:   SURGERY TYPE/DATE: 03/19/1984  NUMBER OF LYMPH NODES REMOVED: 40  CHEMOTHERAPY: yes  RADIATION:  HORMONE TREATMENT: no  INFECTIONS:   LYMPHEDEMA ASSESSMENTS:   LOWER EXTREMITY LANDMARK RIGHT eval RIGHT 11/25/2023 RIGHT 12/02/2023 RIGHT 12/18/2023 RIGHT 01/10/2024  At groin 61.9      30 cm proximal to suprapatella       20 cm proximal to suprapatella 58.2  56.3  56  10 cm  proximal to suprapatella 51.4  49.5  48  At midpatella / popliteal crease 38.1    37  30 cm proximal to floor at lateral plantar foot 41.9 40.5 38.8 39.7 40  20 cm proximal to floor at lateral plantar foot 39.2 39 35.2 37.6 36  10 cm proximal to floor at lateral plantar foot 32.7 31.4 30.0 32.3 29.9  Circumference of ankle/heel       5 cm proximal to 1st MTP joint 21.7    21.4  Across MTP joint 21.9      Around proximal great toe 8.0  7.5 7.7 7.8  (Blank rows = not tested)  LOWER EXTREMITY LANDMARK LEFT eval  At groin 59.5  30 cm proximal to suprapatella   20  cm proximal to suprapatella 56.5  10 cm proximal to suprapatella 45.2  At midpatella / popliteal crease 34.9  30 cm proximal to floor at lateral plantar foot 37.9  20 cm proximal to floor at lateral plantar foot 30.6  10 cm proximal to floor at lateral plantar foot 22.8  Circumference of ankle/heel   5 cm proximal to 1st MTP joint 20.9  Across MTP joint 20.5  Around proximal great toe 7.3  (Blank rows = not tested)  FUNCTIONAL TESTS:    GAIT:WFL   Outcome measure:LLIS: 22%                                                                                                                            TREATMENT DATE:   01/20/2024 MLD to Rt LE: Short neck, superficial and deep abdominals, Rt axillary nodes, Rt ingiuno-axillary anastomosis, then Rt LE as follows: lateral thigh, medial to latera, 4 techniques at knee, ant and post lower leg, ankle, retro malleoli, dorsal foot/toes then retraced all steps.   Compression Bandaging to Rt LE as follows: Cocoa butter, Medium TG soft foot to thigh, Small amt of artiflex to foot and ankle and then at pop fossa, then Comprilan soft 10 cm ankle to knee, and 15 cm  comprilan knee to thigh. Did not wrap toes due to bandage on toes.  Wrapped 6 cm to foot Roman sandal, then 8 cm ASH pattern. 12 cm ankle to knee, then 12 cm below knee to mid thigh, and additional 12 cm knee to groin, then last 12  cm mid shin to groin.    01/15/2024  MLD to Rt LE: Short neck, superficial and deep abdominals, Rt axillary nodes, Rt ingiuno-axillary anastomosis, then Rt LE as follows: lateral thigh, medial to latera, 4 techniques at knee, ant and post lower leg, ankle, retro malleoli, dorsal foot/toes then retraced all steps.   Compression Bandaging to Rt LE as follows: Cocoa butter, Medium TG soft foot to thigh, Small amt of artiflex to foot and ankle and then at pop fossa, then Comprilan soft 10 cm ankle to knee, and 15 cm  comprilan knee to thigh.  Wrapped 1st 3 toes with mollelast due to swelling, 6 cm to foot Roman sandal, then 8 cm ASH pattern. 12 cm ankle to knee, then 12 cm below knee to mid thigh, and additional 12 cm knee to groin, then last 12 cm mid shin to groin. Checked capillary refill and was good Pt measured for compression garment today  01/13/2024 MLD to Rt LE: Short neck, superficial and deep abdominals, Rt axillary nodes, Rt ingiuno-axillary anastomosis, then Rt LE as follows: lateral thigh, medial to latera, 4 techniques at knee, ant and post lower leg, ankle, retro malleoli, dorsal foot/toes then retraced all steps.   Compression Bandaging to Rt LE as follows: Cocoa butter, Medium TG soft foot to thigh, Small amt of artiflex to foot and ankle and then at pop fossa, then Comprilan soft 10  cm ankle to knee, and 15 cm  comprilan knee to thigh.  Wrapped 1st 3 toes with mollelast due to swelling, 6 cm to foot Roman sandal, then 8 cm ASH pattern. 12 cm ankle to knee, then 12 cm below knee to mid thigh, and additional 12 cm knee to groin, then last 12 cm mid shin to groin. Checked capillary refill and was good  01/10/2024 Measured Right LE MLD to Rt LE: Short neck, superficial and deep abdominals, Rt axillary nodes, Rt ingiuno-axillary anastomosis, then Rt LE as follows: lateral thigh, medial to latera, 4 techniques at knee, ant and post lower leg, ankle, retro malleoli, dorsal foot/toes then  retraced all steps.   Compression Bandaging to Rt LE as follows: Cocoa butter, Medium TG soft foot to thigh, Small amt of artiflex to foot and ankle and then at pop fossa, then Comprilan soft 10 cm ankle to knee, and 15 cm  comprilan knee to thigh.  Wrapped 1st 3 toes with mollelast due to swelling, 6 cm to foot Roman sandal, then 8 cm ASH pattern. 12 cm ankle to knee, then 12 cm below knee to mid thigh, and additional 12 cm knee to groin, then last 12 cm mid shin to groin. Checked capillary refill and was good  01/08/2024 Signed pt up for measuring on Wednesday Pts leg reduced after Mondays wrap. MLD to Rt LE: Short neck, superficial and deep abdominals, Rt axillary nodes, Rt ingiuno-axillary anastomosis, then Rt LE as follows: lateral thigh, medial to latera, 4 techniques at knee, ant and post lower leg, ankle, retro malleoli, dorsal foot/toes then retraced all steps.   Compression Bandaging to Rt LE as follows: Cocoa butter, Medium TG soft foot to thigh, Small amt of artiflex to foot and ankle and then at pop fossa, then Comprilan soft 10 cm ankle to knee, and 15 cm  comprilan knee to thigh.  Wrapped 1st 3 toes with mollelast due to swelling, 6 cm to foot Roman sandal, then 8 cm ASH pattern. 12 cm ankle to knee, then 12 cm below knee to mid thigh, and additional 12 cm knee to groin, then last 12 cm mid shin to groin. Checked capillary refill and was good Measure next   01/06/2024 Pts leg much more swollen today after going a week without compression due to ablation MLD to Rt LE: Short neck, superficial and deep abdominals, Rt axillary nodes, Rt ingiuno-axillary anastomosis, then Rt LE as follows: lateral thigh, medial to latera, 4 techniques at knee, ant and post lower leg, ankle, retro malleoli, dorsal foot/toes then retraced all steps.   Compression Bandaging to Rt LE as follows: Cocoa butter, Medium TG soft foot to thigh, Small amt of artiflex to foot and ankle and then at pop fossa, then  Comprilan soft 10 cm ankle to knee, and 15 cm  comprilan knee to thigh.  Wrapped 1st 3 toes with mollelast due to swelling, 6 cm to foot Roman sandal, then 8 cm ASH pattern. 12 cm ankle to knee, then 12 cm below knee to mid thigh, and additional 12 cm knee to groin, then last 12 cm mid shin to groin. Checked capillary refill and was good   12/30/2023 After discussion with Eward Sharps PT supervisor it was determined we should not treat pt for the remainder of this week due to possibility of increased pressure causing arterial bleeding. Cancelled appts this week, and pt will resume next week.   12/24/2023 MLD to Rt LE: Short neck, superficial and deep abdominals,  Rt axillary nodes, Rt ingiuno-axillary anastomosis, then Rt LE as follows: lateral thigh, medial to latera, 4 techniques at knee, ant and post lower leg, ankle, retro malleoli, dorsal foot/toes then retraced all steps.   Compression Bandaging to Rt LE as follows: Cocoa butter, Medium TG soft foot to thigh, Small amt of artiflex to foot and ankle and then at pop fossa, then Comprilan soft 10 cm ankle to knee, and 15 cm  comprilan knee to thigh.  Wrapped 1st 3 toes with mollelast due to swelling, 6 cm to foot Roman sandal, then 8 cm ASH pattern. 12 cm ankle to knee, then 12 cm below knee to mid thigh, and additional 12 cm knee to groin, then last 12 cm mid shin to groin. Checked capillary refill and was good   12/18/2023 Measured lower leg; increased throughout secondary to pt not wearing stocking for 5/10 days while away on her trip MLD to Rt LE: Short neck, superficial and deep abdominals, Rt axillary nodes, Rt ingiuno-axillary anastomosis, then Rt LE as follows: lateral thigh, medial to latera, 4 techniques at knee, ant and post lower leg, ankle, retro malleoli, dorsal foot/toes then retraced all steps.   Compression Bandaging to Rt LE as follows: Cocoa butter, Medium TG soft foot to thigh, Small amt of artiflex to foot and ankle and then at pop  fossa, then Comprilan soft 10 cm ankle to knee, and 15 cm  comprilan knee to thigh.  Wrapped 1st 3 toes with mollelast due to swelling, 6 cm to foot Roman sandal, then 8 cm ASH pattern. 12 cm ankle to knee, then 12 cm below knee to mid thigh, and additional 12 cm knee to groin, then last 12 cm mid shin to groin. Checked capillary refill and was good  12/05/2023 Manual Therapy MLD to Rt LE: Short neck, superficial and deep abdominals, Rt axillary nodes, Rt ingiuno-axillary anastomosis, then Rt LE as follows: lateral thigh, medial to latera, 4 techniques at knee, ant and post lower leg, ankle, retro malleoli, dorsal foot/toes then retraced all steps.   Compression Bandaging to Rt LE as follows: Cocoa butter, Medium TG soft foot to thigh, Small amt of artiflex to foot and ankle and then at pop fossa, then Comprilan soft 10 cm ankle to knee, and 15 cm  comprilan knee to thigh.  Wrapped 1st 3 toes with mollelast due to swelling, 6 cm to foot Roman sandal, then 8 cm ASH pattern. 12 cm ankle to knee, then 12 cm below knee to mid thigh, and additional 12 cm knee to groin, then last 12 cm mid shin to groin. Checked capillary refill and was good.   12/02/2023 Pt measured with excellent results despite being in stocking today Manual Therapy MLD to Rt LE: Short neck, superficial and deep abdominals, Rt axillary nodes, Rt ingiuno-axillary anastomosis, then Rt LE as follows: lateral thigh, medial to latera, 4 techniques at knee, ant and post lower leg, ankle, retro malleoli, dorsal foot/toes then retraced all steps.   Compression Bandaging to Rt LE as follows: Cocoa butter, Medium TG soft foot to thigh, Small amt of artiflex to foot and ankle and then at pop fossa, then Comprilan soft 10 cm ankle to knee, and 15 cm  comprilan knee to thigh.  Wrapped 1st 3 toes with mollelast due to swelling, 6 cm to foot Roman sandal, then 8 cm ASH pattern. 12 cm ankle to knee, then 12 cm below knee to mid thigh, and additional 12 cm  knee to groin, then  last 12 cm mid shin to groin. Checked capillary refill and was good.     11/29/2023 Manual Therapy MLD to Rt LE: Short neck, superficial and deep abdominals, Rt axillary nodes, Rt ingiuno-axillary anastomosis, then Rt LE as follows: lateral thigh, medial to latera, 4 techniques at knee, ant and post lower leg, ankle, retro malleoli, dorsal foot/toes then retraced all steps.   Compression Bandaging to Rt LE as follows: Cocoa butter, Medium TG soft foot to thigh, Small amt of artiflex to foot and ankle and then at pop fossa, then Comprilan soft 10 cm ankle to knee, and 15 cm  comprilan knee to thigh.  Wrapped 1st 3 toes with mollelast due to swelling, 6 cm to foot Roman sandal, then 8 cm ASH pattern. 12 cm ankle to knee, then 12 cm below knee to mid thigh, and additional 12 cm knee to groin, then last 12 cm mid shin to groin. Checked capillary refill and was good.    11/27/23: Manual Therapy MLD to Rt LE: Short neck, superficial and deep abdominals, Rt axillary nodes, Rt ingiuno-axillary anastomosis, then Rt LE as follows: lateral thigh, medial to latera, 4 techniques at knee, ant and post lower leg, ankle, retro malleoli, dorsal foot/toes then retraced all steps. While performing instructed pt in importance of her continuing self MLD at home. But when bandages on only needs to focus on pretreat, axillary nodes and anastomosis as done today. Reviewed correct technique and directionality.  Compression Bandaging to Rt LE as follows: Cocoa butter, Medium TG soft foot to thigh, Small amt of artiflex to foot and ankle and then at pop fossa, then Comprilan soft 10 cm ankle to knee, and 15 cm  comprilan knee to thigh.  6 cm to foot Roman sandal, then 8 cm ASH pattern. 12 cm ankle to knee, then 12 cm below knee to mid thigh, and additional 12 cm knee to groin, then last 12 cm mid shin to groin. Pts husband did the 6 cm and cuing offered for reinforcement throughout, mostly not to pull bandage  but instead offer constant tension throughout. Checked capillary refill and was good. Assisted pt with donning on her velcro sandals. Self Care Instructed pts husband when he was applying 6 cm bandage, see above. Then at end of session explained difference between Active phase of CDT treatment, which we are doing now, then we work to transition to the Maintenance Phase with her new flat knit garment and then they don't have to cont bandaging IF she remains compliant and consistent with daily wear of stocking.   11/25/2023 Measured lower leg with some reduction since wearing stocking Pts husband instructed in compression bandaging . Instructed first by therapist and then pts husband demonstrated. Therapist applied Medium TG soft foot to thigh, Small amt of artiflex to foot and ankle, then Comprilan soft 10 cm ankle to knee, and 15 cm  comprilan knee to thigh. 6 cm to foot with figure 8's, then 8 cm with heel locks and circles up leg. 12 cm mid leg to knee, then 12 cm below knee to mid thigh, and additional 12 cm knee to groin. Pts husband videotaped therapist wrapping, and also performed 6 cm and 8 cm wrap 2 x's each and observed therapist do the remainder due to time. Checked capillary refill and was good. Pt put on her velcro sandals. Left out toe wraps at pt requuest.  11/20/2023   PATIENT EDUCATION:  Education details: POC, LOS, TREATMENT interventions; Twyla Mort name to call  re: adjusting Flexi, Discussed compression stockings maintain, vs bandaging reduces and is very impt part of CDT in addition to MLD or pump. Person educated: Patient and Spouse Education method: Explanation Education comprehension: verbalized understanding  HOME EXERCISE PROGRAM:   ASSESSMENT:  CLINICAL IMPRESSION: Still awaiting script from MD for compression garments but notes have been faxed. Continued MLD and compression bandaging until pt gets new garments.  EVAL Patient is a 73 y.o. female who was seen today  for physical therapy evaluation and treatment for progression of her right  LE lymphedema. . She has lymphostatic lymphedema with congestion and hyperkeratosis. She has been treated for Right LE lymphedema most recently 2 years ago and has a Flexi touch that no longer fits properly due to 30 lb wt. Loss.  Her husband was previously independent in compression bandaging, and is willing to help his wife but will need review. We discussed importance of compression to reduce her leg as she questioned why her leg continues to swell despite using a pump..She has compression stockings but has not been wearing them. She will benefit from skilled therapy to address deficits and return pt to a more fxl lifestyle.  OBJECTIVE IMPAIRMENTS: decreased activity tolerance, decreased knowledge of condition, increased edema, and postural dysfunction.   ACTIVITY LIMITATIONS: locomotion level  PARTICIPATION LIMITATIONS: no restrictions  PERSONAL FACTORS: chronic lymphedema, abdominal surgery for cervical cancer removal with 40 lymph nodes removed, right total knee in June 2022.  are also affecting patient's functional outcome.   REHAB POTENTIAL: Good  CLINICAL DECISION MAKING: Stable/uncomplicated  EVALUATION COMPLEXITY: Low   GOALS: Goals reviewed with patient? Yes  SHORT TERM GOALS: Target date: 12/18/2023 // Pt/ husband will be able to do self compression bandaging  Baseline: Goal status: In Progress  2.  Pts husband will perform MLD or may use compression pump with MLD prn Baseline:  Goal status: Using compression pump after removing bandages 01/15/2024 3.  Pt will reduce at 10 cm prox to suprapatella by 1.5 cm Baseline:  Goal status: MET 01/10/2024 4.  Pt will reduce at 20 cm prox to floor by 2 cm Baseline:  Goal status: MET 01/10/2024  5.  Pt will call flexi touch and will have garment refit due to her wt. loss Baseline:  Goal status: MET 01/10/2024  LONG TERM GOALS: Target date:  01/15/2024  Pt will have appropriate day  time garments to control lymphedema Baseline:  Goal status: In Progress, ordered 01/15/2024 but not received 2.  Pt reports she knows how to control her lymphedema at home with lymph drainage manually or with pump, compression and exercise  Baseline:  Goal status:In Progress  3.  Pt pt will reduce at 10 cm prox to patella by 3 cm to demonstrate improved swelling Baseline:  Goal status: MET 01/10/2024  4.  Pt will reduce at 20 cm prox to floor by 4.5 cmt o demonstrate improved swelling Baseline:  Goal status: In Progress  PLAN:  PT FREQUENCY: 1-2x/week PT DURATION: 4-6 weeks prn  PLANNED INTERVENTIONS: 97164- PT Re-evaluation, 97110-Therapeutic exercises, 97535- Self Care, 02859- Manual therapy, 97760- Orthotic Initial, [id number]- Orthotic/Prosthetic subsequent, Manual lymph drainage, and Compression bandaging  PLAN FOR NEXT SESSION: Cont to instruct pts husband in wrapping,   MLD, measure weekly. Garments ordered today. Script to be faxed to MD 01/16/2024   Grayce JINNY Sheldon, PT 01/20/2024, 9:59 AM

## 2024-01-21 ENCOUNTER — Encounter (HOSPITAL_BASED_OUTPATIENT_CLINIC_OR_DEPARTMENT_OTHER): Payer: Self-pay | Admitting: Obstetrics & Gynecology

## 2024-01-23 ENCOUNTER — Ambulatory Visit: Admitting: Family Medicine

## 2024-01-23 VITALS — BP 128/80 | HR 70 | Temp 98.7°F | Ht 63.0 in | Wt 152.6 lb

## 2024-01-23 DIAGNOSIS — I10 Essential (primary) hypertension: Secondary | ICD-10-CM

## 2024-01-23 DIAGNOSIS — C531 Malignant neoplasm of exocervix: Secondary | ICD-10-CM

## 2024-01-23 DIAGNOSIS — I890 Lymphedema, not elsewhere classified: Secondary | ICD-10-CM | POA: Diagnosis not present

## 2024-01-23 DIAGNOSIS — Z1231 Encounter for screening mammogram for malignant neoplasm of breast: Secondary | ICD-10-CM | POA: Diagnosis not present

## 2024-01-23 DIAGNOSIS — E2839 Other primary ovarian failure: Secondary | ICD-10-CM | POA: Diagnosis not present

## 2024-01-23 DIAGNOSIS — Z0000 Encounter for general adult medical examination without abnormal findings: Secondary | ICD-10-CM | POA: Diagnosis not present

## 2024-01-23 DIAGNOSIS — I480 Paroxysmal atrial fibrillation: Secondary | ICD-10-CM | POA: Diagnosis not present

## 2024-01-23 DIAGNOSIS — E66811 Obesity, class 1: Secondary | ICD-10-CM

## 2024-01-23 DIAGNOSIS — F320 Major depressive disorder, single episode, mild: Secondary | ICD-10-CM | POA: Diagnosis not present

## 2024-01-23 DIAGNOSIS — Z6832 Body mass index (BMI) 32.0-32.9, adult: Secondary | ICD-10-CM

## 2024-01-23 DIAGNOSIS — E6609 Other obesity due to excess calories: Secondary | ICD-10-CM | POA: Diagnosis not present

## 2024-01-23 LAB — HM MAMMOGRAPHY

## 2024-01-23 MED ORDER — CITALOPRAM HYDROBROMIDE 20 MG PO TABS: 20.0000 mg | ORAL_TABLET | Freq: Every day | ORAL | 1 refills | Status: DC

## 2024-01-23 NOTE — Assessment & Plan Note (Signed)
 Stable, chronic.   Diltiazem  120 mg p.o. daily Lasix  20 mg p.o. daily

## 2024-01-23 NOTE — Assessment & Plan Note (Signed)
S/P radial hysterectomy and s/p chemo.

## 2024-01-23 NOTE — Assessment & Plan Note (Addendum)
 Associated with hypertension and impaired glucose, past stroke and paroxysmal atrial fibrillation. She is currently on semaglutide 0.25 mg weekly through a neighbor.  Encouraged exercise, weight loss, healthy eating habits.

## 2024-01-23 NOTE — Assessment & Plan Note (Addendum)
 S/P ablation, and second ablation 12/2023.. now off amiodarone  -Followed by cardiology.. Dr. Inocencio...  reviewed last OVs from 2025 -On Cardizem  120 mg BID and Eliquis  5 mg BID.

## 2024-01-23 NOTE — Assessment & Plan Note (Addendum)
 Stable, chronic.    She would like to switch from cymbalta  to celexa   20 mg daily.  Trazodone  1/2 tablet  100 mg for sleep at night.

## 2024-01-23 NOTE — Assessment & Plan Note (Signed)
Right lower leg

## 2024-01-23 NOTE — Progress Notes (Signed)
 Patient ID: Savannah Hill, female    DOB: 1950-07-19, 73 y.o.   MRN: [id number]  This visit was conducted in person.  BP 128/80   Pulse 70   Temp 98.7 F (37.1 C) (Oral)   Ht 5' 3 (1.6 m)   Wt 152 lb 9.6 oz (69.2 kg)   LMP 03/19/1984   SpO2 97%   BMI 27.03 kg/m    CC:  Chief Complaint  Patient presents with   Medicare Annual Exam    Subjective:   HPI: Savannah Hill is a 73 y.o. female presenting on 01/23/2024 for Medicare Annual Exam  The patient presents for  AMW, and review of chronic health problems. He/She also has the following acute concerns today: none  I have personally reviewed the Medicare Annual Wellness questionnaire and have noted 1. The patient's medical and social history 2. Their use of alcohol , tobacco or illicit drugs 3. Their current medications and supplements 4. The patient's functional ability including ADL's, fall risks, home safety risks and hearing or visual             impairment. 5. Diet and physical activities 6. Evidence for depression or mood disorders 7.         Updated provider list Cognitive evaluation was performed and recorded on pt medicare questionnaire form. The patients weight, height, BMI and visual acuity have been recorded in the chart   I have made referrals, counseling and provided education to the patient based review of the above and I have provided the pt with a written personalized care plan for preventive services.   Documentation of this information was scanned into the electronic record under the media tab.   Nml Vison, nml hearing.  No falls in last year.    Hypertension:  Well-controlled   BP Readings from Last 3 Encounters:  01/23/24 128/80  01/17/24 123/85  12/27/23 134/76  Using medication without problems or lightheadedness: none Chest pain with exertion: none Edema: chronic lymphedema  right leg. On daily lasix . Short of breath: none Average home BPs: Other issues:  Followed by cardiology  for left anterior fascicular block, mitral valve disease, paroxysmal atrial fibrillation Recent Ablation in 12/27/2023...  now in sinus.  History of stroke: Followed by neurology  Prediabetes:  Lab Results  Component Value Date   HGBA1C 5.3 01/15/2024   Obesity, BMI 32.5: On semaglutide unclear amount... in neighborhood.   Prescribed  by  NP from neighborhoods.  She has lost 20Lb in 4 months. Body mass index is 27.03 kg/m.  Wt Readings from Last 3 Encounters:  01/23/24 152 lb 9.6 oz (69.2 kg)  12/27/23 150 lb (68 kg)  10/16/23 157 lb (71.2 kg)   Elevated Cholesterol:  On crestor   5 mg  every other daily Lab Results  Component Value Date   CHOL 160 01/15/2024   HDL 67.10 01/15/2024   LDLCALC 82 01/15/2024   TRIG 56.0 01/15/2024   CHOLHDL 2 01/15/2024  Using medications without problems: Muscle aches:  Diet compliance: improving Exercise: working on increasing Other complaints:    MDD,GAD: Well controlled on Cymblata but this med cause bad taste in mouth  Was not able to stop in past. [place] from 01/23/2024 in [facility] [place]  PHQ-2 Total Score 0     Relevant past medical, surgical, family and social history reviewed and updated as indicated. Interim medical history since our last visit reviewed. Allergies and medications reviewed and updated.  Outpatient Medications Prior to Visit  Medication Sig Dispense Refill   ALPRAZolam  (XANAX ) 0.5 MG tablet Take 1 tablet (0.5 mg total) by mouth at bedtime as needed for anxiety. 30 tablet 0   amoxicillin -clavulanate (AUGMENTIN ) 875-125 MG tablet Take 1 tablet by mouth every 12 (twelve) hours. 14 tablet 0   diltiazem  (CARDIZEM  CD) 120 MG 24 hr capsule Take 1 capsule (120 mg total) by mouth daily. 90 capsule 2   ELIQUIS  5 MG TABS tablet TAKE 1 TABLET TWICE A DAY 180 tablet 1   furosemide  (LASIX ) 20 MG tablet TAKE 1 TABLET DAILY 90 tablet 0   nitrofurantoin ,  macrocrystal-monohydrate, (MACROBID ) 100 MG capsule Take 1 capsule (100 mg total) by mouth daily as needed. Take 1 po qd post coital 90 capsule 1   rosuvastatin  (CRESTOR ) 5 MG tablet TAKE 1 TABLET DAILY 90 tablet 1   Semaglutide,0.25 or 0.5MG /DOS, (OZEMPIC, 0.25 OR 0.5 MG/DOSE,) 2 MG/1.5ML SOPN Inject 0.25 mg into the skin every 14 (fourteen) days.     traZODone  (DESYREL ) 100 MG tablet TAKE 1 TABLET AT BEDTIME ASNEEDED FOR SLEEP 90 tablet 0   DULoxetine  (CYMBALTA ) 30 MG capsule Take 1 capsule (30 mg total) by mouth daily. 30 capsule 3   acetaminophen  (TYLENOL ) 500 MG tablet Take 1,000 mg by mouth every 6 (six) hours as needed for moderate pain (pain score 4-6). (Patient not taking: Reported on 01/23/2024)     amiodarone  (PACERONE ) 200 MG tablet Take 1 tablet (200 mg total) by mouth daily. Take 2 tablets (400 mg total) TWICE a day for 2 weeks, then take 1 tablet (200 mg total) TWICE a day for 2 weeks, then take 1 tablet (200 mg total) ONCE a day thereafter (Patient not taking: Reported on 11/20/2023) 90 tablet 1   Bromfenac Sodium 0.07 % SOLN Place 1 drop into the right eye daily.     Difluprednate 0.05 % EMUL Place 1 drop into the right eye in the morning and at bedtime.     gatifloxacin (ZYMAXID) 0.5 % SOLN Place 1 drop into the right eye 3 (three) times daily.     predniSONE  (DELTASONE ) 20 MG tablet 3 tabs by mouth daily in AM x 3 days, then 2 tabs by mouth daily  In AM x 2 days then 1 tab by mouth daily in AM x 2 days (Patient not taking: Reported on 11/20/2023) 15 tablet 0   No facility-administered medications prior to visit.     Per HPI unless specifically indicated in ROS section below Review of Systems  Constitutional:  Negative for fatigue and fever.  HENT:  Negative for congestion.   Eyes:  Negative for pain.  Respiratory:  Negative for cough and shortness of breath.   Cardiovascular:  Negative for chest pain, palpitations and leg swelling.  Gastrointestinal:  Negative for abdominal pain.   Genitourinary:  Negative for dysuria and vaginal bleeding.  Musculoskeletal:  Negative for back pain.  Neurological:  Negative for syncope, light-headedness and headaches.  Psychiatric/Behavioral:  Negative for dysphoric mood.    Objective:  BP 128/80   Pulse 70   Temp 98.7 F (37.1 C) (Oral)   Ht 5' 3 (1.6 m)   Wt 152 lb 9.6 oz (69.2 kg)   LMP 03/19/1984   SpO2 97%   BMI 27.03 kg/m   Wt Readings from Last 3 Encounters:  01/23/24 152 lb 9.6 oz (69.2 kg)  12/27/23 150 lb (68 kg)  10/16/23 157 lb (71.2 kg)      Physical Exam  Constitutional:      General: She is not in acute distress.    Appearance: Normal appearance. She is well-developed. She is not ill-appearing or toxic-appearing.  HENT:     Head: Normocephalic.      Comments:  Well healing  skin ca removal site, but still painful.    Right Ear: Hearing, tympanic membrane, ear canal and external ear normal. Tympanic membrane is not erythematous, retracted or bulging.     Left Ear: Hearing, tympanic membrane, ear canal and external ear normal. Tympanic membrane is not erythematous, retracted or bulging.     Nose: No mucosal edema or rhinorrhea.     Right Sinus: No maxillary sinus tenderness or frontal sinus tenderness.     Left Sinus: No maxillary sinus tenderness or frontal sinus tenderness.     Mouth/Throat:     Pharynx: Uvula midline.  Eyes:     General: Lids are normal. Lids are everted, no foreign bodies appreciated.     Conjunctiva/sclera: Conjunctivae normal.     Pupils: Pupils are equal, round, and reactive to light.  Neck:     Thyroid : No thyroid  mass or thyromegaly.     Vascular: No carotid bruit.     Trachea: Trachea normal.  Cardiovascular:     Rate and Rhythm: Normal rate and regular rhythm.     Pulses: Normal pulses.     Heart sounds: Normal heart sounds, S1 normal and S2 normal. No murmur heard.    No friction rub. No gallop.  Pulmonary:     Effort: Pulmonary effort is normal. No tachypnea or  respiratory distress.     Breath sounds: Normal breath sounds. No decreased breath sounds, wheezing, rhonchi or rales.  Abdominal:     General: Bowel sounds are normal.     Palpations: Abdomen is soft.     Tenderness: There is no abdominal tenderness.  Musculoskeletal:     Cervical back: Normal range of motion and neck supple.  Skin:    General: Skin is warm and dry.     Findings: No rash.  Neurological:     Mental Status: She is alert.  Psychiatric:        Mood and Affect: Mood is not anxious or depressed.        Speech: Speech normal.        Behavior: Behavior normal. Behavior is cooperative.        Thought Content: Thought content normal.        Judgment: Judgment normal.       Results for orders placed or performed in visit on 01/15/24  Lipid panel   Collection Time: 01/15/24  8:46 AM  Result Value Ref Range   Cholesterol 160 0 - 200 mg/dL   Triglycerides 43.9 0.0 - 149.0 mg/dL   HDL 32.89 >60.99 mg/dL   VLDL 88.7 0.0 - 59.9 mg/dL   LDL Cholesterol 82 0 - 99 mg/dL   Total CHOL/HDL Ratio 2    NonHDL 93.22   Hemoglobin A1c   Collection Time: 01/15/24  8:46 AM  Result Value Ref Range   Hgb A1c MFr Bld 5.3 4.6 - 6.5 %  Comprehensive metabolic panel   Collection Time: 01/15/24  8:46 AM  Result Value Ref Range   Sodium 137 135 - 145 mEq/L   Potassium 4.1 3.5 - 5.1 mEq/L   Chloride 102 96 - 112 mEq/L   CO2 29 19 - 32 mEq/L   Glucose, Bld 90 70 - 99 mg/dL   BUN 11  6 - 23 mg/dL   Creatinine, Ser 9.14 0.40 - 1.20 mg/dL   Total Bilirubin 0.7 0.2 - 1.2 mg/dL   Alkaline Phosphatase 56 39 - 117 U/L   AST 19 0 - 37 U/L   ALT 8 0 - 35 U/L   Total Protein 6.5 6.0 - 8.3 g/dL   Albumin 3.9 3.5 - 5.2 g/dL   GFR 31.85 >39.99 mL/min   Calcium  9.0 8.4 - 10.5 mg/dL  VITAMIN D  25 Hydroxy (Vit-D Deficiency, Fractures)   Collection Time: 01/15/24  8:46 AM  Result Value Ref Range   VITD 49.26 30.00 - 100.00 ng/mL    Assessment and Plan The patient's preventative maintenance and  recommended screening tests for an annual wellness exam were reviewed in full today. Brought up to date unless services declined.  Counselled on the importance of diet, exercise, and its role in overall health and mortality. The patient's FH and SH was reviewed, including their home life, tobacco status, and drug and alcohol  status.    Vaccines: Up-to-date with Shingrix  vaccine series, pneumonia series, flu given 12/2023 , tetanus up-to-date. Pap/DVE:  HX of small cell ca of cervix... S/P total hysterectomy Followed  GYN Mammo:  01/17/2023 due Bone Density: 12/09/2018, nml, due.. will plan with mammogram last year Colon: 11/10/2018 repeat in 10 years Smoking Status:none ETOH/ drug use: none/none  Hep C:  done    Medicare annual wellness visit, subsequent  Essential hypertension Assessment & Plan: Stable, chronic.   Diltiazem  120 mg p.o. daily Lasix  20 mg p.o. daily   PAF (paroxysmal atrial fibrillation) (HCC) Assessment & Plan:  S/P ablation, and second ablation 12/2023.. now off amiodarone  -Followed by cardiology.. Dr. Inocencio...  reviewed last OVs from 2025 -On Cardizem  120 mg BID and Eliquis  5 mg BID.   Lymphedema Assessment & Plan:  Right lower leg.     Current mild episode of major depressive disorder without prior episode Assessment & Plan: Stable, chronic.    She would like to switch from cymbalta  to celexa   20 mg daily.  Trazodone  1/2 tablet  100 mg for sleep at night.   Class 1 obesity due to excess calories with serious comorbidity and body mass index (BMI) of 32.0 to 32.9 in adult Assessment & Plan:  Associated with hypertension and impaired glucose, past stroke and paroxysmal atrial fibrillation. She is currently on semaglutide 0.25 mg weekly through a neighbor.  Encouraged exercise, weight loss, healthy eating habits.    Malignant neoplasm of exocervix (HCC) Assessment & Plan: S/P radial hysterectomy and s/p chemo.   Estrogen deficiency -      DG Bone Density; Future  Other orders -     Citalopram  Hydrobromide; Take 1 tablet (20 mg total) by mouth daily.  Dispense: 90 tablet; Refill: 1     No follow-ups on file.   Greig Ring, MD

## 2024-01-24 ENCOUNTER — Ambulatory Visit

## 2024-01-24 ENCOUNTER — Ambulatory Visit (HOSPITAL_COMMUNITY)
Admission: RE | Admit: 2024-01-24 | Discharge: 2024-01-24 | Disposition: A | Discharge status: Home / Self Care | Source: Ambulatory Visit | Attending: Physician Assistant | Admitting: Physician Assistant

## 2024-01-24 ENCOUNTER — Ambulatory Visit: Payer: Self-pay | Admitting: Family Medicine

## 2024-01-24 ENCOUNTER — Encounter: Payer: Self-pay | Admitting: Family Medicine

## 2024-01-24 ENCOUNTER — Other Ambulatory Visit (HOSPITAL_BASED_OUTPATIENT_CLINIC_OR_DEPARTMENT_OTHER): Payer: Self-pay | Admitting: Obstetrics & Gynecology

## 2024-01-24 VITALS — BP 116/68 | HR 68 | Ht 63.0 in | Wt 152.0 lb

## 2024-01-24 DIAGNOSIS — C531 Malignant neoplasm of exocervix: Secondary | ICD-10-CM | POA: Diagnosis not present

## 2024-01-24 DIAGNOSIS — I890 Lymphedema, not elsewhere classified: Secondary | ICD-10-CM

## 2024-01-24 DIAGNOSIS — I4891 Unspecified atrial fibrillation: Secondary | ICD-10-CM | POA: Insufficient documentation

## 2024-01-24 DIAGNOSIS — D6869 Other thrombophilia: Secondary | ICD-10-CM | POA: Insufficient documentation

## 2024-01-24 DIAGNOSIS — Z90710 Acquired absence of both cervix and uterus: Secondary | ICD-10-CM

## 2024-01-24 DIAGNOSIS — I480 Paroxysmal atrial fibrillation: Secondary | ICD-10-CM | POA: Diagnosis not present

## 2024-01-24 MED ORDER — NONFORMULARY OR COMPOUNDED ITEM: 0 refills | Status: AC

## 2024-01-24 NOTE — Progress Notes (Signed)
 Primary Care Physician: Avelina Greig BRAVO, MD Primary Cardiologist: Will Gladis Norton, MD Electrophysiologist: Will Gladis Norton, MD  Referring Physician: Dr Norton Lister Savannah Hill is a 72 y.o. female with a history of HTN, CVA, CAD, HLD, cervical cancer, atrial fibrillation who presents for follow up in the [facility].  The patient had an afib ablation 10/05/16. She had done well until this year when she noted afib episodes multiple times per week. She underwent repeat afib ablation on 12/27/23. Patient is on Eliquis  for stroke prevention.    Patient presents today for follow up for atrial fibrillation. She is in SR today and feels well. She denies any interim symptoms of afib since the ablation. She denies chest pain or groin issues. No bleeding issues on anticoagulation.   Today, she denies symptoms of chest pain, shortness of breath, orthopnea, PND, lower extremity edema, dizziness, presyncope, syncope, snoring, daytime somnolence, bleeding, or neurologic sequela. The patient is tolerating medications without difficulties and is otherwise without complaint today.    Atrial Fibrillation Risk Factors:  she does not have symptoms or diagnosis of sleep apnea. she does not have a history of rheumatic fever. she does have a history of alcohol  use.   Atrial Fibrillation Management history:  Previous antiarrhythmic drugs: none Previous cardioversions: none Previous ablations: 09/2016, 12/27/23 Anticoagulation history: Eliquis   ROS- All systems are reviewed and negative except as per the HPI above.  Past Medical History:  Diagnosis Date   Anemia    borderline   Atrial fibrillation (HCC)    a. s/p ablation on 10/05/2016   Cancer [facility])    cervical/rad hysterectomy/bso wiht chemo for small cell ca   Dyspnea    Heart valve disorder    HLD (hyperlipidemia)    Hypertension    Joint pain    Lower extremity edema    Obesity    Palpitations      Current Outpatient Medications  Medication Sig Dispense Refill   ALPRAZolam  (XANAX ) 0.5 MG tablet Take 1 tablet (0.5 mg total) by mouth at bedtime as needed for anxiety. (Patient taking differently: Take 0.5 mg by mouth as needed for anxiety.) 30 tablet 0   amoxicillin -clavulanate (AUGMENTIN ) 875-125 MG tablet Take 1 tablet by mouth every 12 (twelve) hours. 14 tablet 0   citalopram  (CELEXA ) 20 MG tablet Take 1 tablet (20 mg total) by mouth daily. 90 tablet 1   diltiazem  (CARDIZEM  CD) 120 MG 24 hr capsule Take 1 capsule (120 mg total) by mouth daily. 90 capsule 2   ELIQUIS  5 MG TABS tablet TAKE 1 TABLET TWICE A DAY 180 tablet 1   furosemide  (LASIX ) 20 MG tablet TAKE 1 TABLET DAILY 90 tablet 0   nitrofurantoin , macrocrystal-monohydrate, (MACROBID ) 100 MG capsule Take 1 capsule (100 mg total) by mouth daily as needed. Take 1 po qd post coital (Patient taking differently: Take 100 mg by mouth as needed. Take 1 po qd post coital) 90 capsule 1   NONFORMULARY OR COMPOUNDED ITEM Qty (2) [employer] 350 Custom Thigh High stocking with open toe and 5 cm wide silicone band.   Qty 1: Circaid Profile Custom Thigh High Night Garment with Midnight Over sleeve  And foot pad. 1 each 0   rosuvastatin  (CRESTOR ) 5 MG tablet TAKE 1 TABLET DAILY 90 tablet 1   Semaglutide,0.25 or 0.5MG /DOS, (OZEMPIC, 0.25 OR 0.5 MG/DOSE,) 2 MG/1.5ML SOPN Inject 0.25 mg into the skin every 14 (fourteen) days.     traZODone  (  DESYREL ) 100 MG tablet TAKE 1 TABLET AT BEDTIME ASNEEDED FOR SLEEP 90 tablet 0   No current facility-administered medications for this encounter.    Physical Exam: BP 116/68   Pulse 68   Ht 5' 3 (1.6 m)   Wt 68.9 kg   LMP 03/19/1984   BMI 26.93 kg/m   GEN: Well nourished, well developed in no acute distress CARDIAC: Regular rate and rhythm, no murmurs, rubs, gallops RESPIRATORY:  Clear to auscultation without rales, wheezing or rhonchi  ABDOMEN: Soft, non-tender, non-distended EXTREMITIES:   chronic lower extremity edema   Wt Readings from Last 3 Encounters:  01/24/24 68.9 kg  01/23/24 69.2 kg  12/27/23 68 kg     EKG today demonstrates  SR, 1st degree AV block, ICVD Vent. rate 68 BPM PR interval 290 ms QRS duration 122 ms QT/QTcB 458/487 ms   Echo 09/18/23 demonstrated   1. Left ventricular ejection fraction, by estimation, is 50 to 55%. The  left ventricle has low normal function. The left ventricle demonstrates  global hypokinesis. Left ventricular diastolic parameters are consistent  with Grade I diastolic dysfunction (impaired relaxation).   2. Right ventricular systolic function is normal. The right ventricular  size is normal.   3. Left atrial size was mildly dilated.   4. The mitral valve is normal in structure. Mild to moderate mitral valve  regurgitation. No evidence of mitral stenosis.   5. The aortic valve is tricuspid. Aortic valve regurgitation is not  visualized. No aortic stenosis is present.   6. The inferior vena cava is normal in size with greater than 50%  respiratory variability, suggesting right atrial pressure of 3 mmHg.     CHA2DS2-VASc Score = 6  The patient's score is based upon: CHF History: 0 HTN History: 1 Diabetes History: 0 Stroke History: 2 Vascular Disease History: 1 Age Score: 1 Gender Score: 1       ASSESSMENT AND PLAN: Persistent Atrial Fibrillation (ICD10:  I48.19) The patient's CHA2DS2-VASc score is 6, indicating a 9.7% annual risk of stroke.   S/p afib ablation 12/27/23 Patient appears to be maintaining SR Continue diltiazem  120 mg daily Continue Eliquis  5 mg BID with no missed doses for 3 months post ablation.   Secondary Hypercoagulable State (ICD10:  D68.69) The patient is at significant risk for stroke/thromboembolism based upon her CHA2DS2-VASc Score of 6.  Continue Apixaban  (Eliquis ). No bleeding issues.   HTN Stable on current regimen  VHD Mild to moderate MR Continue to monitor   CAD CAC score 92  on CT No anginal symptoms   Follow up in the AF clinic in 2 months.     [facility] [address] [place], [place] [address] [phone]

## 2024-01-24 NOTE — Therapy (Signed)
 OUTPATIENT PHYSICAL THERAPY  LOWER EXTREMITY ONCOLOGY TREATMENT  Patient Name: Savannah Hill MRN: [id number] DOB:24-Feb-1951, 73 y.o., female Today's Date: 01/24/2024  END OF SESSION:  PT End of Session - 01/24/24 0755     Visit Number 27    Number of Visits 37    Date for Recertification  02/26/24    PT Start Time 0758    PT Stop Time 0857    PT Time Calculation (min) 59 min    Activity Tolerance Patient tolerated treatment well    Behavior During Therapy [facility] for tasks assessed/performed          Past Medical History:  Diagnosis Date   Anemia    borderline   Atrial fibrillation (HCC)    a. s/p ablation on 10/05/2016   Cancer (HCC)    cervical/rad hysterectomy/bso wiht chemo for small cell ca   Dyspnea    Heart valve disorder    HLD (hyperlipidemia)    Hypertension    Joint pain    Lower extremity edema    Obesity    Palpitations    Past Surgical History:  Procedure Laterality Date   ABLATION OF DYSRHYTHMIC FOCUS  10/05/2016   ATRIAL FIBRILLATION ABLATION N/A 10/05/2016   Procedure: Atrial Fibrillation Ablation;  Surgeon: Inocencio Soyla Lunger, MD;  Location: [facility] INVASIVE CV LAB;  Service: Cardiovascular;  Laterality: N/A;   ATRIAL FIBRILLATION ABLATION N/A 12/27/2023   Procedure: ATRIAL FIBRILLATION ABLATION;  Surgeon: Inocencio Soyla Lunger, MD;  Location: [facility] INVASIVE CV LAB;  Service: Cardiovascular;  Laterality: N/A;   IR RADIOLOGY PERIPHERAL GUIDED IV START  09/28/2016   IR US  GUIDE VASC ACCESS RIGHT  09/28/2016   RADICAL ABDOMINAL HYSTERECTOMY  1986   with BSO   REPLACEMENT TOTAL KNEE Right 2022   TEE WITHOUT CARDIOVERSION N/A 06/07/2016   Procedure: TRANSESOPHAGEAL ECHOCARDIOGRAM (TEE);  Surgeon: Jerel Balding, MD;  Location: [facility] ENDOSCOPY;  Service: Cardiovascular;  Laterality: N/A;   Patient Active Problem List   Diagnosis Date Noted   History of CVA (cerebrovascular accident) 05/28/2021   Hyperlipidemia 06/17/2019   Class 1 obesity due to excess  calories with serious comorbidity and body mass index (BMI) of 32.0 to 32.9 in adult 12/05/2018   Postmenopausal 12/05/2018   Family history of diabetes mellitus in father 12/05/2018   Impaired fasting glucose 12/05/2018   Small cell carcinoma (HCC)- of the cervix 12/05/2018   Hip pain, bilateral 05/15/2018   Essential hypertension 01/29/2018   Chronic insomnia 01/29/2018   MDD (major depressive disorder), single episode 01/29/2018   Excessive drinking of alcohol  01/29/2018   Chronic anticoagulation 10/06/2016   Mitral valve disease    PAF (paroxysmal atrial fibrillation) (HCC) 07/01/2013   LAFB (left anterior fascicular block) 07/01/2013   Lymphedema 07/01/2013   h/o Cervical cancer 10/01/2011   H/O total hysterectomy 10/01/2011    PCP:   REFERRING PROVIDER: Ronal Pinal, MD  REFERRING DIAG: Right LE Lymphedema  THERAPY DIAG:  Malignant neoplasm of exocervix (HCC)  Lymphedema, not elsewhere classified  S/P total hysterectomy  ONSET DATE: April 2025 exacerbation  Rationale for Evaluation and Treatment: Rehabilitation  SUBJECTIVE:  SUBJECTIVE STATEMENT  I took the wrap off yesterday am and put my stocking on.  EVAL I have not been wearing garments but I have been using the pump every night. I am not using the Flexi Touch because It doesn't fit properly since I lost 30 lbs and I don't know how to adjust it. I have been using a basic pump and it does OK and my leg reduces but by the next day swelling returns. She has an Ablation for A Fib October 10. In 2 weeks we go to the [facility]  for 10 days. Pt has old compression stockings, a Flexi touch and another basic style pump, and a Sigvaris night garment.   PERTINENT HISTORY:  radical hysterectomy in 1986 for small cell cervical endocrine  cancer with 40 pelvic lymph nodes removed with chemo, no radiation. She developed some swelling after the procedure but has noticed and increase as she has gotten older and with some weight gain. She has a totat knee June 2022 and has had an increase in lymphedema since. She has an extremity pump from [employer] that she got in June 2022 and a Flexi touch that she received in 2023.[name] She has been treated here from 02/24/2021-06/2021 PAIN:  Are you having pain? No, occasionally if I move unexpectedly  PRECAUTIONS: Right LE lymphedema, prior CVA,Anemia, cervical CA, HLD, HTN, A-Fib, s/p TKA   RED FLAGS: None   WEIGHT BEARING RESTRICTIONS: No  FALLS:  Has patient fallen in last 6 months? No  LIVING ENVIRONMENT: Lives with: lives with their spouse Lives in: House/apartment Stairs: No;    OCCUPATION: Retired  LEISURE: read, cross word puzzles  PRIOR LEVEL OF FUNCTION: Independent  PATIENT GOALS: Decrease swelling right LE   OBJECTIVE: Note: Objective measures were completed at Evaluation unless otherwise noted.  COGNITION: Overall cognitive status: Within functional limits for tasks assessed   PALPATION: No pitting edema  OBSERVATIONS / OTHER ASSESSMENTS: significant right LE swelling, non pitting, but relatively soft  SENSATION: Light touch: Deficits     POSTURE: Round shoulders, forward head  LYMPHEDEMA ASSESSMENTS:   SURGERY TYPE/DATE: 03/19/1984  NUMBER OF LYMPH NODES REMOVED: 40  CHEMOTHERAPY: yes  RADIATION:  HORMONE TREATMENT: no  INFECTIONS:   LYMPHEDEMA ASSESSMENTS:   LOWER EXTREMITY LANDMARK RIGHT eval RIGHT 11/25/2023 RIGHT 12/02/2023 RIGHT 12/18/2023 RIGHT 01/10/2024 RIGHT 01/24/2024  At groin 61.9       30 cm proximal to suprapatella        20 cm proximal to suprapatella 58.2  56.3  56 55.2  10 cm proximal to suprapatella 51.4  49.5  48 50..1  At midpatella / popliteal crease 38.1    37 36.7  30 cm proximal to floor at lateral plantar foot 41.9 40.5  38.8 39.7 40 40.5  20 cm proximal to floor at lateral plantar foot 39.2 39 35.2 37.6 36 37  10 cm proximal to floor at lateral plantar foot 32.7 31.4 30.0 32.3 29.9 30.4  Circumference of ankle/heel        5 cm proximal to 1st MTP joint 21.7    21.4 21.3  Across MTP joint 21.9       Around proximal great toe 8.0  7.5 7.7 7.8 7.8  (Blank rows = not tested)  LOWER EXTREMITY LANDMARK LEFT eval  At groin 59.5  30 cm proximal to suprapatella   20 cm proximal to suprapatella 56.5  10 cm proximal to suprapatella 45.2  At midpatella / popliteal crease 34.9  30 cm  proximal to floor at lateral plantar foot 37.9  20 cm proximal to floor at lateral plantar foot 30.6  10 cm proximal to floor at lateral plantar foot 22.8  Circumference of ankle/heel   5 cm proximal to 1st MTP joint 20.9  Across MTP joint 20.5  Around proximal great toe 7.3  (Blank rows = not tested)  FUNCTIONAL TESTS:    GAIT:WFL   Outcome measure:LLIS: 22%                                                                                                                            TREATMENT DATE:  01/24/2024 Pt measured MLD to Rt LE: Short neck, superficial and deep abdominals, Rt axillary nodes, Rt ingiuno-axillary anastomosis, then Rt LE as follows: lateral thigh, medial to latera, 4 techniques at knee, ant and post lower leg, ankle, retro malleoli, dorsal foot/toes then retraced all steps.   Compression Bandaging to Rt LE as follows: Eucerin lotion, Medium TG soft foot to thigh, Small amt of artiflex to foot and ankle , then Comprilan soft 10 cm ankle to knee, and 15 cm  comprilan knee to thigh. Toes 1-3 with mollelast.  Wrapped 6 cm to foot Roman sandal, then 8 cm ASH pattern. 12 cm ankle to knee, then 12 cm below knee to mid thigh, and additional 12 cm knee to groin, then last 12 cm ankle to knee, and 12 cm knee to groin.    01/20/2024 MLD to Rt LE: Short neck, superficial and deep abdominals, Rt axillary nodes, Rt  ingiuno-axillary anastomosis, then Rt LE as follows: lateral thigh, medial to latera, 4 techniques at knee, ant and post lower leg, ankle, retro malleoli, dorsal foot/toes then retraced all steps.   Compression Bandaging to Rt LE as follows: Cocoa butter, Medium TG soft foot to thigh, Small amt of artiflex to foot and ankle and then at pop fossa, then Comprilan soft 10 cm ankle to knee, and 15 cm  comprilan knee to thigh. Did not wrap toes due to bandage on toes.  Wrapped 6 cm to foot Roman sandal, then 8 cm ASH pattern. 12 cm ankle to knee, then 12 cm below knee to mid thigh, and additional 12 cm knee to groin, then last 12 cm mid shin to groin.    01/15/2024  MLD to Rt LE: Short neck, superficial and deep abdominals, Rt axillary nodes, Rt ingiuno-axillary anastomosis, then Rt LE as follows: lateral thigh, medial to latera, 4 techniques at knee, ant and post lower leg, ankle, retro malleoli, dorsal foot/toes then retraced all steps.   Compression Bandaging to Rt LE as follows: Cocoa butter, Medium TG soft foot to thigh, Small amt of artiflex to foot and ankle and then at pop fossa, then Comprilan soft 10 cm ankle to knee, and 15 cm  comprilan knee to thigh.  Wrapped 1st 3 toes with mollelast due to swelling, 6 cm to foot Roman sandal, then 8 cm ASH pattern. 12  cm ankle to knee, then 12 cm below knee to mid thigh, and additional 12 cm knee to groin, then last 12 cm mid shin to groin. Checked capillary refill and was good Pt measured for compression garment today  01/13/2024 MLD to Rt LE: Short neck, superficial and deep abdominals, Rt axillary nodes, Rt ingiuno-axillary anastomosis, then Rt LE as follows: lateral thigh, medial to latera, 4 techniques at knee, ant and post lower leg, ankle, retro malleoli, dorsal foot/toes then retraced all steps.   Compression Bandaging to Rt LE as follows: Cocoa butter, Medium TG soft foot to thigh, Small amt of artiflex to foot and ankle and then at pop fossa, then  Comprilan soft 10 cm ankle to knee, and 15 cm  comprilan knee to thigh.  Wrapped 1st 3 toes with mollelast due to swelling, 6 cm to foot Roman sandal, then 8 cm ASH pattern. 12 cm ankle to knee, then 12 cm below knee to mid thigh, and additional 12 cm knee to groin, then last 12 cm mid shin to groin. Checked capillary refill and was good  01/10/2024 Measured Right LE MLD to Rt LE: Short neck, superficial and deep abdominals, Rt axillary nodes, Rt ingiuno-axillary anastomosis, then Rt LE as follows: lateral thigh, medial to latera, 4 techniques at knee, ant and post lower leg, ankle, retro malleoli, dorsal foot/toes then retraced all steps.   Compression Bandaging to Rt LE as follows: Cocoa butter, Medium TG soft foot to thigh, Small amt of artiflex to foot and ankle and then at pop fossa, then Comprilan soft 10 cm ankle to knee, and 15 cm  comprilan knee to thigh.  Wrapped 1st 3 toes with mollelast due to swelling, 6 cm to foot Roman sandal, then 8 cm ASH pattern. 12 cm ankle to knee, then 12 cm below knee to mid thigh, and additional 12 cm knee to groin, then last 12 cm mid shin to groin. Checked capillary refill and was good  01/08/2024 Signed pt up for measuring on Wednesday Pts leg reduced after Mondays wrap. MLD to Rt LE: Short neck, superficial and deep abdominals, Rt axillary nodes, Rt ingiuno-axillary anastomosis, then Rt LE as follows: lateral thigh, medial to latera, 4 techniques at knee, ant and post lower leg, ankle, retro malleoli, dorsal foot/toes then retraced all steps.   Compression Bandaging to Rt LE as follows: Cocoa butter, Medium TG soft foot to thigh, Small amt of artiflex to foot and ankle and then at pop fossa, then Comprilan soft 10 cm ankle to knee, and 15 cm  comprilan knee to thigh.  Wrapped 1st 3 toes with mollelast due to swelling, 6 cm to foot Roman sandal, then 8 cm ASH pattern. 12 cm ankle to knee, then 12 cm below knee to mid thigh, and additional 12 cm knee to groin,  then last 12 cm mid shin to groin. Checked capillary refill and was good Measure next   01/06/2024 Pts leg much more swollen today after going a week without compression due to ablation MLD to Rt LE: Short neck, superficial and deep abdominals, Rt axillary nodes, Rt ingiuno-axillary anastomosis, then Rt LE as follows: lateral thigh, medial to latera, 4 techniques at knee, ant and post lower leg, ankle, retro malleoli, dorsal foot/toes then retraced all steps.   Compression Bandaging to Rt LE as follows: Cocoa butter, Medium TG soft foot to thigh, Small amt of artiflex to foot and ankle and then at pop fossa, then Comprilan soft 10 cm ankle to knee,  and 15 cm  comprilan knee to thigh.  Wrapped 1st 3 toes with mollelast due to swelling, 6 cm to foot Roman sandal, then 8 cm ASH pattern. 12 cm ankle to knee, then 12 cm below knee to mid thigh, and additional 12 cm knee to groin, then last 12 cm mid shin to groin. Checked capillary refill and was good   12/30/2023 After discussion with Eward Sharps PT supervisor it was determined we should not treat pt for the remainder of this week due to possibility of increased pressure causing arterial bleeding. Cancelled appts this week, and pt will resume next week.   12/24/2023 MLD to Rt LE: Short neck, superficial and deep abdominals, Rt axillary nodes, Rt ingiuno-axillary anastomosis, then Rt LE as follows: lateral thigh, medial to latera, 4 techniques at knee, ant and post lower leg, ankle, retro malleoli, dorsal foot/toes then retraced all steps.   Compression Bandaging to Rt LE as follows: Cocoa butter, Medium TG soft foot to thigh, Small amt of artiflex to foot and ankle and then at pop fossa, then Comprilan soft 10 cm ankle to knee, and 15 cm  comprilan knee to thigh.  Wrapped 1st 3 toes with mollelast due to swelling, 6 cm to foot Roman sandal, then 8 cm ASH pattern. 12 cm ankle to knee, then 12 cm below knee to mid thigh, and additional 12 cm knee to groin,  then last 12 cm mid shin to groin. Checked capillary refill and was good   12/18/2023 Measured lower leg; increased throughout secondary to pt not wearing stocking for 5/10 days while away on her trip MLD to Rt LE: Short neck, superficial and deep abdominals, Rt axillary nodes, Rt ingiuno-axillary anastomosis, then Rt LE as follows: lateral thigh, medial to latera, 4 techniques at knee, ant and post lower leg, ankle, retro malleoli, dorsal foot/toes then retraced all steps.   Compression Bandaging to Rt LE as follows: Cocoa butter, Medium TG soft foot to thigh, Small amt of artiflex to foot and ankle and then at pop fossa, then Comprilan soft 10 cm ankle to knee, and 15 cm  comprilan knee to thigh.  Wrapped 1st 3 toes with mollelast due to swelling, 6 cm to foot Roman sandal, then 8 cm ASH pattern. 12 cm ankle to knee, then 12 cm below knee to mid thigh, and additional 12 cm knee to groin, then last 12 cm mid shin to groin. Checked capillary refill and was good  12/05/2023 Manual Therapy MLD to Rt LE: Short neck, superficial and deep abdominals, Rt axillary nodes, Rt ingiuno-axillary anastomosis, then Rt LE as follows: lateral thigh, medial to latera, 4 techniques at knee, ant and post lower leg, ankle, retro malleoli, dorsal foot/toes then retraced all steps.   Compression Bandaging to Rt LE as follows: Cocoa butter, Medium TG soft foot to thigh, Small amt of artiflex to foot and ankle and then at pop fossa, then Comprilan soft 10 cm ankle to knee, and 15 cm  comprilan knee to thigh.  Wrapped 1st 3 toes with mollelast due to swelling, 6 cm to foot Roman sandal, then 8 cm ASH pattern. 12 cm ankle to knee, then 12 cm below knee to mid thigh, and additional 12 cm knee to groin, then last 12 cm mid shin to groin. Checked capillary refill and was good.   12/02/2023 Pt measured with excellent results despite being in stocking today Manual Therapy MLD to Rt LE: Short neck, superficial and deep abdominals, Rt  axillary nodes,  Rt ingiuno-axillary anastomosis, then Rt LE as follows: lateral thigh, medial to latera, 4 techniques at knee, ant and post lower leg, ankle, retro malleoli, dorsal foot/toes then retraced all steps.   Compression Bandaging to Rt LE as follows: Cocoa butter, Medium TG soft foot to thigh, Small amt of artiflex to foot and ankle and then at pop fossa, then Comprilan soft 10 cm ankle to knee, and 15 cm  comprilan knee to thigh.  Wrapped 1st 3 toes with mollelast due to swelling, 6 cm to foot Roman sandal, then 8 cm ASH pattern. 12 cm ankle to knee, then 12 cm below knee to mid thigh, and additional 12 cm knee to groin, then last 12 cm mid shin to groin. Checked capillary refill and was good.     11/29/2023 Manual Therapy MLD to Rt LE: Short neck, superficial and deep abdominals, Rt axillary nodes, Rt ingiuno-axillary anastomosis, then Rt LE as follows: lateral thigh, medial to latera, 4 techniques at knee, ant and post lower leg, ankle, retro malleoli, dorsal foot/toes then retraced all steps.   Compression Bandaging to Rt LE as follows: Cocoa butter, Medium TG soft foot to thigh, Small amt of artiflex to foot and ankle and then at pop fossa, then Comprilan soft 10 cm ankle to knee, and 15 cm  comprilan knee to thigh.  Wrapped 1st 3 toes with mollelast due to swelling, 6 cm to foot Roman sandal, then 8 cm ASH pattern. 12 cm ankle to knee, then 12 cm below knee to mid thigh, and additional 12 cm knee to groin, then last 12 cm mid shin to groin. Checked capillary refill and was good.    11/20/2023   PATIENT EDUCATION:  Education details: POC, LOS, TREATMENT interventions; Twyla Mort name to call re: adjusting Flexi, Discussed compression stockings maintain, vs bandaging reduces and is very impt part of CDT in addition to MLD or pump. Person educated: Patient and Spouse Education method: Explanation Education comprehension: verbalized understanding  HOME EXERCISE  PROGRAM:   ASSESSMENT:  CLINICAL IMPRESSION: Still awaiting fax from MD. Slight increase in measurements in lower leg, but pt has been in stocking since yesterday morning.  EVAL Patient is a 73 y.o. female who was seen today for physical therapy evaluation and treatment for progression of her right  LE lymphedema. . She has lymphostatic lymphedema with congestion and hyperkeratosis. She has been treated for Right LE lymphedema most recently 2 years ago and has a Flexi touch that no longer fits properly due to 30 lb wt. Loss.  Her husband was previously independent in compression bandaging, and is willing to help his wife but will need review. We discussed importance of compression to reduce her leg as she questioned why her leg continues to swell despite using a pump..She has compression stockings but has not been wearing them. She will benefit from skilled therapy to address deficits and return pt to a more fxl lifestyle.  OBJECTIVE IMPAIRMENTS: decreased activity tolerance, decreased knowledge of condition, increased edema, and postural dysfunction.   ACTIVITY LIMITATIONS: locomotion level  PARTICIPATION LIMITATIONS: no restrictions  PERSONAL FACTORS: chronic lymphedema, abdominal surgery for cervical cancer removal with 40 lymph nodes removed, right total knee in June 2022.  are also affecting patient's functional outcome.   REHAB POTENTIAL: Good  CLINICAL DECISION MAKING: Stable/uncomplicated  EVALUATION COMPLEXITY: Low   GOALS: Goals reviewed with patient? Yes  SHORT TERM GOALS: Target date: 12/18/2023 // Pt/ husband will be able to do self compression bandaging  Baseline:  Goal status: In Progress  2.  Pts husband will perform MLD or may use compression pump with MLD prn Baseline:  Goal status: Using compression pump after removing bandages 01/15/2024 3.  Pt will reduce at 10 cm prox to suprapatella by 1.5 cm Baseline:  Goal status: MET 01/10/2024 4.  Pt will reduce at 20  cm prox to floor by 2 cm Baseline:  Goal status: MET 01/10/2024  5.  Pt will call flexi touch and will have garment refit due to her wt. loss Baseline:  Goal status: MET 01/10/2024  LONG TERM GOALS: Target date: 01/15/2024  Pt will have appropriate day  time garments to control lymphedema Baseline:  Goal status: In Progress, ordered 01/15/2024 but not received 2.  Pt reports she knows how to control her lymphedema at home with lymph drainage manually or with pump, compression and exercise  Baseline:  Goal status:In Progress  3.  Pt pt will reduce at 10 cm prox to patella by 3 cm to demonstrate improved swelling Baseline:  Goal status: MET 01/10/2024  4.  Pt will reduce at 20 cm prox to floor by 4.5 cmt o demonstrate improved swelling Baseline:  Goal status: In Progress  PLAN:  PT FREQUENCY: 1-2x/week PT DURATION: 4-6 weeks prn  PLANNED INTERVENTIONS: 97164- PT Re-evaluation, 97110-Therapeutic exercises, 97535- Self Care, [id number]- Manual therapy, 97760- Orthotic Initial, [id number]- Orthotic/Prosthetic subsequent, Manual lymph drainage, and Compression bandaging  PLAN FOR NEXT SESSION: Cont to instruct pts husband in wrapping,   MLD, measure weekly. Garments ordered today. Script to be faxed to MD 01/16/2024   Grayce JINNY Sheldon, PT 01/24/2024, 9:00 AM

## 2024-01-27 ENCOUNTER — Ambulatory Visit

## 2024-01-27 DIAGNOSIS — I890 Lymphedema, not elsewhere classified: Secondary | ICD-10-CM | POA: Diagnosis not present

## 2024-01-27 DIAGNOSIS — C531 Malignant neoplasm of exocervix: Secondary | ICD-10-CM | POA: Diagnosis not present

## 2024-01-27 DIAGNOSIS — Z90710 Acquired absence of both cervix and uterus: Secondary | ICD-10-CM

## 2024-01-27 NOTE — Therapy (Signed)
 OUTPATIENT PHYSICAL THERAPY  LOWER EXTREMITY ONCOLOGY TREATMENT  Patient Name: Savannah Hill MRN: [id number] DOB:Dec 01, 1950, 73 y.o., female Today's Date: 01/27/2024  END OF SESSION:  PT End of Session - 01/27/24 1103     Visit Number 28    Number of Visits 37    Date for Recertification  02/26/24    PT Start Time 1105    PT Stop Time 1200    PT Time Calculation (min) 55 min    Activity Tolerance Patient tolerated treatment well    Behavior During Therapy [facility] for tasks assessed/performed          Past Medical History:  Diagnosis Date   Anemia    borderline   Atrial fibrillation (HCC)    a. s/p ablation on 10/05/2016   Cancer (HCC)    cervical/rad hysterectomy/bso wiht chemo for small cell ca   Dyspnea    Heart valve disorder    HLD (hyperlipidemia)    Hypertension    Joint pain    Lower extremity edema    Obesity    Palpitations    Past Surgical History:  Procedure Laterality Date   ABLATION OF DYSRHYTHMIC FOCUS  10/05/2016   ATRIAL FIBRILLATION ABLATION N/A 10/05/2016   Procedure: Atrial Fibrillation Ablation;  Surgeon: Inocencio Soyla Lunger, MD;  Location: [facility] INVASIVE CV LAB;  Service: Cardiovascular;  Laterality: N/A;   ATRIAL FIBRILLATION ABLATION N/A 12/27/2023   Procedure: ATRIAL FIBRILLATION ABLATION;  Surgeon: Inocencio Soyla Lunger, MD;  Location: [facility] INVASIVE CV [facility];  Service: Cardiovascular;  Laterality: N/A;   IR RADIOLOGY PERIPHERAL GUIDED IV START  09/28/2016   IR US  GUIDE VASC ACCESS RIGHT  09/28/2016   RADICAL ABDOMINAL HYSTERECTOMY  1986   with BSO   REPLACEMENT TOTAL KNEE Right 2022   TEE WITHOUT CARDIOVERSION N/A 06/07/2016   Procedure: TRANSESOPHAGEAL ECHOCARDIOGRAM (TEE);  Surgeon: Jerel Balding, MD;  Location: [facility] ENDOSCOPY;  Service: Cardiovascular;  Laterality: N/A;   Patient Active Problem List   Diagnosis Date Noted   History of CVA (cerebrovascular accident) 05/28/2021   Hyperlipidemia 06/17/2019   Class 1 obesity due to excess  calories with serious comorbidity and body mass index (BMI) of 32.0 to 32.9 in adult 12/05/2018   Postmenopausal 12/05/2018   Family history of diabetes mellitus in father 12/05/2018   Impaired fasting glucose 12/05/2018   Small cell carcinoma (HCC)- of the cervix 12/05/2018   Hip pain, bilateral 05/15/2018   Essential hypertension 01/29/2018   Chronic insomnia 01/29/2018   MDD (major depressive disorder), single episode 01/29/2018   Excessive drinking of alcohol  01/29/2018   Chronic anticoagulation 10/06/2016   Mitral valve disease    PAF (paroxysmal atrial fibrillation) (HCC) 07/01/2013   LAFB (left anterior fascicular block) 07/01/2013   Lymphedema 07/01/2013   h/o Cervical cancer 10/01/2011   H/O total hysterectomy 10/01/2011    PCP:   REFERRING PROVIDER: Ronal Pinal, MD  REFERRING DIAG: Right LE Lymphedema  THERAPY DIAG:  Malignant neoplasm of exocervix (HCC)  Lymphedema, not elsewhere classified  S/P total hysterectomy  ONSET DATE: April 2025 exacerbation  Rationale for Evaluation and Treatment: Rehabilitation  SUBJECTIVE:  SUBJECTIVE STATEMENT   The wrap did fine and left it on until "Sunday am.   EVAL I have not been wearing garments but I have been using the pump every night. I am not using the Flexi Touch because It doesn't fit properly since I lost 30 lbs and I don't know how to adjust it. I have been using a basic pump and it does OK and my leg reduces but by the next day swelling returns. She has an Ablation for A Fib October 10. In 2 weeks we go to [place] 10 days. Pt has old compression stockings, a Flexi touch and another basic style pump, and a Sigvaris night garment.   PERTINENT HISTORY:  radical hysterectomy in 1986 for small cell cervical endocrine cancer  with 40 pelvic lymph nodes removed with chemo, no radiation. She developed some swelling after the procedure but has noticed and increase as she has gotten older and with some weight gain. She has a totat knee June 2022 and has had an increase in lymphedema since. She has an extremity pump from [employer] that she got in June 2022 and a Flexi touch that she received in 2023.. She has been treated here from 02/24/2021-06/2021 PAIN:  Are you having pain? No, occasionally if I move unexpectedly  PRECAUTIONS: Right LE lymphedema, prior CVA,Anemia, cervical CA, HLD, HTN, A-Fib, s/p TKA   RED FLAGS: None   WEIGHT BEARING RESTRICTIONS: No  FALLS:  Has patient fallen in last 6 months? No  LIVING ENVIRONMENT: Lives with: lives with their spouse Lives in: House/apartment Stairs: No;    OCCUPATION: Retired  LEISURE: read, cross word puzzles  PRIOR LEVEL OF FUNCTION: Independent  PATIENT GOALS: Decrease swelling right LE   OBJECTIVE: Note: Objective measures were completed at Evaluation unless otherwise noted.  COGNITION: Overall cognitive status: Within functional limits for tasks assessed   PALPATION: No pitting edema  OBSERVATIONS / OTHER ASSESSMENTS: significant right LE swelling, non pitting, but relatively soft  SENSATION: Light touch: Deficits     POSTURE: Round shoulders, forward head  LYMPHEDEMA ASSESSMENTS:   SURGERY TYPE/DATE: 03/19/1984  NUMBER OF LYMPH NODES REMOVED: 40  CHEMOTHERAPY: yes  RADIATION:  HORMONE TREATMENT: no  INFECTIONS:   LYMPHEDEMA ASSESSMENTS:   LOWER EXTREMITY LANDMARK RIGHT eval RIGHT 11/25/2023 RIGHT 12/02/2023 RIGHT 12/18/2023 RIGHT 01/10/2024 RIGHT 01/24/2024  At groin 61.9       30 cm proximal to suprapatella        20"  cm proximal to suprapatella 58.2  56.3  56 55.2  10 cm proximal to suprapatella 51.4  49.5  48 50..1  At midpatella / popliteal crease 38.1    37 36.7  30 cm proximal to floor at lateral plantar foot 41.9 40.5 38.8  39.7 40 40.5  20 cm proximal to floor at lateral plantar foot 39.2 39 35.2 37.6 36 37  10 cm proximal to floor at lateral plantar foot 32.7 31.4 30.0 32.3 29.9 30.4  Circumference of ankle/heel        5 cm proximal to 1st MTP joint 21.7    21.4 21.3  Across MTP joint 21.9       Around proximal great toe 8.0  7.5 7.7 7.8 7.8  (Blank rows = not tested)  LOWER EXTREMITY LANDMARK LEFT eval  At groin 59.5  30 cm proximal to suprapatella   20 cm proximal to suprapatella 56.5  10 cm proximal to suprapatella 45.2  At midpatella / popliteal crease 34.9  30  cm proximal to floor at lateral plantar foot 37.9  20 cm proximal to floor at lateral plantar foot 30.6  10 cm proximal to floor at lateral plantar foot 22.8  Circumference of ankle/heel   5 cm proximal to 1st MTP joint 20.9  Across MTP joint 20.5  Around proximal great toe 7.3  (Blank rows = not tested)  FUNCTIONAL TESTS:    GAIT:WFL   Outcome measure:LLIS: 22%                                                                                                                            TREATMENT DATE:   01/27/2024 MLD to Rt LE: Short neck, superficial and deep abdominals, Rt axillary nodes, Rt ingiuno-axillary anastomosis, then Rt LE as follows: lateral thigh, medial to latera, 4 techniques at knee, ant and post lower leg, ankle, retro malleoli, dorsal foot/toes then retraced all steps.   Compression Bandaging to Rt LE as follows: Eucerin lotion, Medium TG soft foot to thigh, Small amt of artiflex to foot and ankle , then Comprilan soft 10 cm ankle to knee, and 15 cm  comprilan knee to thigh. Toes 1-3 with mollelast.  Wrapped 6 cm to foot Roman sandal, then 8 cm ASH pattern. 12 cm ankle to knee, then 12 cm below knee to mid thigh, and additional 12 cm knee to groin, then last 12 cm ankle to knee, and 12 cm knee to groin.    01/24/2024 Pt measured MLD to Rt LE: Short neck, superficial and deep abdominals, Rt axillary nodes, Rt  ingiuno-axillary anastomosis, then Rt LE as follows: lateral thigh, medial to latera, 4 techniques at knee, ant and post lower leg, ankle, retro malleoli, dorsal foot/toes then retraced all steps.   Compression Bandaging to Rt LE as follows: Eucerin lotion, Medium TG soft foot to thigh, Small amt of artiflex to foot and ankle , then Comprilan soft 10 cm ankle to knee, and 15 cm  comprilan knee to thigh. Toes 1-3 with mollelast.  Wrapped 6 cm to foot Roman sandal, then 8 cm ASH pattern. 12 cm ankle to knee, then 12 cm below knee to mid thigh, and additional 12 cm knee to groin, then last 12 cm ankle to knee, and 12 cm knee to groin.    01/20/2024 MLD to Rt LE: Short neck, superficial and deep abdominals, Rt axillary nodes, Rt ingiuno-axillary anastomosis, then Rt LE as follows: lateral thigh, medial to latera, 4 techniques at knee, ant and post lower leg, ankle, retro malleoli, dorsal foot/toes then retraced all steps.   Compression Bandaging to Rt LE as follows: Cocoa butter, Medium TG soft foot to thigh, Small amt of artiflex to foot and ankle and then at pop fossa, then Comprilan soft 10 cm ankle to knee, and 15 cm  comprilan knee to thigh. Did not wrap toes due to bandage on toes.  Wrapped 6 cm to foot Roman sandal, then 8 cm ASH pattern. 12 cm ankle  to knee, then 12 cm below knee to mid thigh, and additional 12 cm knee to groin, then last 12 cm mid shin to groin.    01/15/2024  MLD to Rt LE: Short neck, superficial and deep abdominals, Rt axillary nodes, Rt ingiuno-axillary anastomosis, then Rt LE as follows: lateral thigh, medial to latera, 4 techniques at knee, ant and post lower leg, ankle, retro malleoli, dorsal foot/toes then retraced all steps.   Compression Bandaging to Rt LE as follows: Cocoa butter, Medium TG soft foot to thigh, Small amt of artiflex to foot and ankle and then at pop fossa, then Comprilan soft 10 cm ankle to knee, and 15 cm  comprilan knee to thigh.  Wrapped 1st 3 toes with  mollelast due to swelling, 6 cm to foot Roman sandal, then 8 cm ASH pattern. 12 cm ankle to knee, then 12 cm below knee to mid thigh, and additional 12 cm knee to groin, then last 12 cm mid shin to groin. Checked capillary refill and was good Pt measured for compression garment today  01/13/2024 MLD to Rt LE: Short neck, superficial and deep abdominals, Rt axillary nodes, Rt ingiuno-axillary anastomosis, then Rt LE as follows: lateral thigh, medial to latera, 4 techniques at knee, ant and post lower leg, ankle, retro malleoli, dorsal foot/toes then retraced all steps.   Compression Bandaging to Rt LE as follows: Cocoa butter, Medium TG soft foot to thigh, Small amt of artiflex to foot and ankle and then at pop fossa, then Comprilan soft 10 cm ankle to knee, and 15 cm  comprilan knee to thigh.  Wrapped 1st 3 toes with mollelast due to swelling, 6 cm to foot Roman sandal, then 8 cm ASH pattern. 12 cm ankle to knee, then 12 cm below knee to mid thigh, and additional 12 cm knee to groin, then last 12 cm mid shin to groin. Checked capillary refill and was good  01/10/2024 Measured Right LE MLD to Rt LE: Short neck, superficial and deep abdominals, Rt axillary nodes, Rt ingiuno-axillary anastomosis, then Rt LE as follows: lateral thigh, medial to latera, 4 techniques at knee, ant and post lower leg, ankle, retro malleoli, dorsal foot/toes then retraced all steps.   Compression Bandaging to Rt LE as follows: Cocoa butter, Medium TG soft foot to thigh, Small amt of artiflex to foot and ankle and then at pop fossa, then Comprilan soft 10 cm ankle to knee, and 15 cm  comprilan knee to thigh.  Wrapped 1st 3 toes with mollelast due to swelling, 6 cm to foot Roman sandal, then 8 cm ASH pattern. 12 cm ankle to knee, then 12 cm below knee to mid thigh, and additional 12 cm knee to groin, then last 12 cm mid shin to groin. Checked capillary refill and was good  01/08/2024 Signed pt up for measuring on Wednesday Pts  leg reduced after Mondays wrap. MLD to Rt LE: Short neck, superficial and deep abdominals, Rt axillary nodes, Rt ingiuno-axillary anastomosis, then Rt LE as follows: lateral thigh, medial to latera, 4 techniques at knee, ant and post lower leg, ankle, retro malleoli, dorsal foot/toes then retraced all steps.   Compression Bandaging to Rt LE as follows: Cocoa butter, Medium TG soft foot to thigh, Small amt of artiflex to foot and ankle and then at pop fossa, then Comprilan soft 10 cm ankle to knee, and 15 cm  comprilan knee to thigh.  Wrapped 1st 3 toes with mollelast due to swelling, 6 cm to foot Roman  sandal, then 8 cm ASH pattern. 12 cm ankle to knee, then 12 cm below knee to mid thigh, and additional 12 cm knee to groin, then last 12 cm mid shin to groin. Checked capillary refill and was good Measure next   01/06/2024 Pts leg much more swollen today after going a week without compression due to ablation MLD to Rt LE: Short neck, superficial and deep abdominals, Rt axillary nodes, Rt ingiuno-axillary anastomosis, then Rt LE as follows: lateral thigh, medial to latera, 4 techniques at knee, ant and post lower leg, ankle, retro malleoli, dorsal foot/toes then retraced all steps.   Compression Bandaging to Rt LE as follows: Cocoa butter, Medium TG soft foot to thigh, Small amt of artiflex to foot and ankle and then at pop fossa, then Comprilan soft 10 cm ankle to knee, and 15 cm  comprilan knee to thigh.  Wrapped 1st 3 toes with mollelast due to swelling, 6 cm to foot Roman sandal, then 8 cm ASH pattern. 12 cm ankle to knee, then 12 cm below knee to mid thigh, and additional 12 cm knee to groin, then last 12 cm mid shin to groin. Checked capillary refill and was good   12/30/2023 After discussion with Eward Sharps PT supervisor it was determined we should not treat pt for the remainder of this week due to possibility of increased pressure causing arterial bleeding. Cancelled appts this week, and pt will  resume next week.   12/24/2023 MLD to Rt LE: Short neck, superficial and deep abdominals, Rt axillary nodes, Rt ingiuno-axillary anastomosis, then Rt LE as follows: lateral thigh, medial to latera, 4 techniques at knee, ant and post lower leg, ankle, retro malleoli, dorsal foot/toes then retraced all steps.   Compression Bandaging to Rt LE as follows: Cocoa butter, Medium TG soft foot to thigh, Small amt of artiflex to foot and ankle and then at pop fossa, then Comprilan soft 10 cm ankle to knee, and 15 cm  comprilan knee to thigh.  Wrapped 1st 3 toes with mollelast due to swelling, 6 cm to foot Roman sandal, then 8 cm ASH pattern. 12 cm ankle to knee, then 12 cm below knee to mid thigh, and additional 12 cm knee to groin, then last 12 cm mid shin to groin. Checked capillary refill and was good   12/18/2023 Measured lower leg; increased throughout secondary to pt not wearing stocking for 5/10 days while away on her trip MLD to Rt LE: Short neck, superficial and deep abdominals, Rt axillary nodes, Rt ingiuno-axillary anastomosis, then Rt LE as follows: lateral thigh, medial to latera, 4 techniques at knee, ant and post lower leg, ankle, retro malleoli, dorsal foot/toes then retraced all steps.   Compression Bandaging to Rt LE as follows: Cocoa butter, Medium TG soft foot to thigh, Small amt of artiflex to foot and ankle and then at pop fossa, then Comprilan soft 10 cm ankle to knee, and 15 cm  comprilan knee to thigh.  Wrapped 1st 3 toes with mollelast due to swelling, 6 cm to foot Roman sandal, then 8 cm ASH pattern. 12 cm ankle to knee, then 12 cm below knee to mid thigh, and additional 12 cm knee to groin, then last 12 cm mid shin to groin. Checked capillary refill and was good  12/05/2023 Manual Therapy MLD to Rt LE: Short neck, superficial and deep abdominals, Rt axillary nodes, Rt ingiuno-axillary anastomosis, then Rt LE as follows: lateral thigh, medial to latera, 4 techniques at knee, ant and  post  lower leg, ankle, retro malleoli, dorsal foot/toes then retraced all steps.   Compression Bandaging to Rt LE as follows: Cocoa butter, Medium TG soft foot to thigh, Small amt of artiflex to foot and ankle and then at pop fossa, then Comprilan soft 10 cm ankle to knee, and 15 cm  comprilan knee to thigh.  Wrapped 1st 3 toes with mollelast due to swelling, 6 cm to foot Roman sandal, then 8 cm ASH pattern. 12 cm ankle to knee, then 12 cm below knee to mid thigh, and additional 12 cm knee to groin, then last 12 cm mid shin to groin. Checked capillary refill and was good.   12/02/2023 Pt measured with excellent results despite being in stocking today Manual Therapy MLD to Rt LE: Short neck, superficial and deep abdominals, Rt axillary nodes, Rt ingiuno-axillary anastomosis, then Rt LE as follows: lateral thigh, medial to latera, 4 techniques at knee, ant and post lower leg, ankle, retro malleoli, dorsal foot/toes then retraced all steps.   Compression Bandaging to Rt LE as follows: Cocoa butter, Medium TG soft foot to thigh, Small amt of artiflex to foot and ankle and then at pop fossa, then Comprilan soft 10 cm ankle to knee, and 15 cm  comprilan knee to thigh.  Wrapped 1st 3 toes with mollelast due to swelling, 6 cm to foot Roman sandal, then 8 cm ASH pattern. 12 cm ankle to knee, then 12 cm below knee to mid thigh, and additional 12 cm knee to groin, then last 12 cm mid shin to groin. Checked capillary refill and was good.     11/29/2023 Manual Therapy MLD to Rt LE: Short neck, superficial and deep abdominals, Rt axillary nodes, Rt ingiuno-axillary anastomosis, then Rt LE as follows: lateral thigh, medial to latera, 4 techniques at knee, ant and post lower leg, ankle, retro malleoli, dorsal foot/toes then retraced all steps.   Compression Bandaging to Rt LE as follows: Cocoa butter, Medium TG soft foot to thigh, Small amt of artiflex to foot and ankle and then at pop fossa, then Comprilan soft 10 cm ankle  to knee, and 15 cm  comprilan knee to thigh.  Wrapped 1st 3 toes with mollelast due to swelling, 6 cm to foot Roman sandal, then 8 cm ASH pattern. 12 cm ankle to knee, then 12 cm below knee to mid thigh, and additional 12 cm knee to groin, then last 12 cm mid shin to groin. Checked capillary refill and was good.    11/20/2023   PATIENT EDUCATION:  Education details: POC, LOS, TREATMENT interventions; Twyla Mort name to call re: adjusting Flexi, Discussed compression stockings maintain, vs bandaging reduces and is very impt part of CDT in addition to MLD or pump. Person educated: Patient and Spouse Education method: Explanation Education comprehension: verbalized understanding  HOME EXERCISE PROGRAM:   ASSESSMENT:  CLINICAL IMPRESSION:  Script scanned to [employer].  Increased swelling at ankle when pt wears compression stocking instead of bandage. Continue CDT until pt receives garments. EVAL Patient is a 73 y.o. female who was seen today for physical therapy evaluation and treatment for progression of her right  LE lymphedema. . She has lymphostatic lymphedema with congestion and hyperkeratosis. She has been treated for Right LE lymphedema most recently 2 years ago and has a Flexi touch that no longer fits properly due to 30 lb wt. Loss.  Her husband was previously independent in compression bandaging, and is willing to help his wife but will need review.  We discussed importance of compression to reduce her leg as she questioned why her leg continues to swell despite using a pump..She has compression stockings but has not been wearing them. She will benefit from skilled therapy to address deficits and return pt to a more fxl lifestyle.  OBJECTIVE IMPAIRMENTS: decreased activity tolerance, decreased knowledge of condition, increased edema, and postural dysfunction.   ACTIVITY LIMITATIONS: locomotion level  PARTICIPATION LIMITATIONS: no restrictions  PERSONAL FACTORS: chronic lymphedema,  abdominal surgery for cervical cancer removal with 40 lymph nodes removed, right total knee in June 2022.  are also affecting patient's functional outcome.   REHAB POTENTIAL: Good  CLINICAL DECISION MAKING: Stable/uncomplicated  EVALUATION COMPLEXITY: Low   GOALS: Goals reviewed with patient? Yes  SHORT TERM GOALS: Target date: 12/18/2023 // Pt/ husband will be able to do self compression bandaging  Baseline: Goal status: In Progress  2.  Pts husband will perform MLD or may use compression pump with MLD prn Baseline:  Goal status: Using compression pump after removing bandages 01/15/2024 3.  Pt will reduce at 10 cm prox to suprapatella by 1.5 cm Baseline:  Goal status: MET 01/10/2024 4.  Pt will reduce at 20 cm prox to floor by 2 cm Baseline:  Goal status: MET 01/10/2024  5.  Pt will call flexi touch and will have garment refit due to her wt. loss Baseline:  Goal status: MET 01/10/2024  LONG TERM GOALS: Target date: 01/15/2024  Pt will have appropriate day  time garments to control lymphedema Baseline:  Goal status: In Progress, ordered 01/15/2024 but not received 2.  Pt reports she knows how to control her lymphedema at home with lymph drainage manually or with pump, compression and exercise  Baseline:  Goal status:In Progress  3.  Pt pt will reduce at 10 cm prox to patella by 3 cm to demonstrate improved swelling Baseline:  Goal status: MET 01/10/2024  4.  Pt will reduce at 20 cm prox to floor by 4.5 cmt o demonstrate improved swelling Baseline:  Goal status: In Progress  PLAN:  PT FREQUENCY: 1-2x/week PT DURATION: 4-6 weeks prn  PLANNED INTERVENTIONS: [id number]- PT Re-evaluation, 97110-Therapeutic exercises, 97535- Self Care, [id number]- Manual therapy, 97760- Orthotic Initial, [id number]- Orthotic/Prosthetic subsequent, Manual lymph drainage, and Compression bandaging  PLAN FOR NEXT SESSION: Cont to instruct pts husband in wrapping,   MLD, measure weekly. Garments ordered  today. Script to be faxed to MD 01/16/2024   Grayce JINNY Sheldon, PT 01/27/2024, 12:01 PM

## 2024-01-29 ENCOUNTER — Ambulatory Visit

## 2024-01-29 DIAGNOSIS — I890 Lymphedema, not elsewhere classified: Secondary | ICD-10-CM | POA: Diagnosis not present

## 2024-01-29 DIAGNOSIS — Z90710 Acquired absence of both cervix and uterus: Secondary | ICD-10-CM | POA: Diagnosis not present

## 2024-01-29 DIAGNOSIS — C531 Malignant neoplasm of exocervix: Secondary | ICD-10-CM | POA: Diagnosis not present

## 2024-01-29 NOTE — Therapy (Signed)
 OUTPATIENT PHYSICAL THERAPY  LOWER EXTREMITY ONCOLOGY TREATMENT  Patient Name: Savannah Hill MRN: [id number] DOB:1950/05/04, 73 y.o., female Today's Date: 01/29/2024  END OF SESSION:  PT End of Session - 01/29/24 0901     Visit Number 29    Number of Visits 37    Date for Recertification  02/26/24    PT Start Time 0901    PT Stop Time 0956    PT Time Calculation (min) 55 min    Activity Tolerance Patient tolerated treatment well    Behavior During Therapy [facility] for tasks assessed/performed          Past Medical History:  Diagnosis Date   Anemia    borderline   Atrial fibrillation (HCC)    a. s/p ablation on 10/05/2016   Cancer (HCC)    cervical/rad hysterectomy/bso wiht chemo for small cell ca   Dyspnea    Heart valve disorder    HLD (hyperlipidemia)    Hypertension    Joint pain    Lower extremity edema    Obesity    Palpitations    Past Surgical History:  Procedure Laterality Date   ABLATION OF DYSRHYTHMIC FOCUS  10/05/2016   ATRIAL FIBRILLATION ABLATION N/A 10/05/2016   Procedure: Atrial Fibrillation Ablation;  Surgeon: Inocencio Soyla Lunger, MD;  Location: [facility] INVASIVE CV LAB;  Service: Cardiovascular;  Laterality: N/A;   ATRIAL FIBRILLATION ABLATION N/A 12/27/2023   Procedure: ATRIAL FIBRILLATION ABLATION;  Surgeon: Inocencio Soyla Lunger, MD;  Location: [facility] INVASIVE CV [facility];  Service: Cardiovascular;  Laterality: N/A;   IR RADIOLOGY PERIPHERAL GUIDED IV START  09/28/2016   IR US  GUIDE VASC ACCESS RIGHT  09/28/2016   RADICAL ABDOMINAL HYSTERECTOMY  1986   with BSO   REPLACEMENT TOTAL KNEE Right 2022   TEE WITHOUT CARDIOVERSION N/A 06/07/2016   Procedure: TRANSESOPHAGEAL ECHOCARDIOGRAM (TEE);  Surgeon: Jerel Balding, MD;  Location: [facility] ENDOSCOPY;  Service: Cardiovascular;  Laterality: N/A;   Patient Active Problem List   Diagnosis Date Noted   History of CVA (cerebrovascular accident) 05/28/2021   Hyperlipidemia 06/17/2019   Class 1 obesity due to excess  calories with serious comorbidity and body mass index (BMI) of 32.0 to 32.9 in adult 12/05/2018   Postmenopausal 12/05/2018   Family history of diabetes mellitus in father 12/05/2018   Impaired fasting glucose 12/05/2018   Small cell carcinoma (HCC)- of the cervix 12/05/2018   Hip pain, bilateral 05/15/2018   Essential hypertension 01/29/2018   Chronic insomnia 01/29/2018   MDD (major depressive disorder), single episode 01/29/2018   Excessive drinking of alcohol  01/29/2018   Chronic anticoagulation 10/06/2016   Mitral valve disease    PAF (paroxysmal atrial fibrillation) (HCC) 07/01/2013   LAFB (left anterior fascicular block) 07/01/2013   Lymphedema 07/01/2013   h/o Cervical cancer 10/01/2011   H/O total hysterectomy 10/01/2011    PCP:   REFERRING PROVIDER: Ronal Pinal, MD  REFERRING DIAG: Right LE Lymphedema  THERAPY DIAG:  Malignant neoplasm of exocervix (HCC)  Lymphedema, not elsewhere classified  S/P total hysterectomy  ONSET DATE: April 2025 exacerbation  Rationale for Evaluation and Treatment: Rehabilitation  SUBJECTIVE:  SUBJECTIVE STATEMENT   Pts leg continues to do well. Still awaiting compression garments.   EVAL I have not been wearing garments but I have been using the pump every night. I am not using the Flexi Touch because It doesn't fit properly since I lost 30 lbs and I don't know how to adjust it. I have been using a basic pump and it does OK and my leg reduces but by the next day swelling returns. She has an Ablation for A Fib October 10. In 2 weeks we go to the [facility]  for 10 days. Pt has old compression stockings, a Flexi touch and another basic style pump, and a Sigvaris night garment.   PERTINENT HISTORY:  radical hysterectomy in 1986 for small cell cervical  endocrine cancer with 40 pelvic lymph nodes removed with chemo, no radiation. She developed some swelling after the procedure but has noticed and increase as she has gotten older and with some weight gain. She has a totat knee June 2022 and has had an increase in lymphedema since. She has an extremity pump from [employer] that she got in June 2022 and a Flexi touch that she received in 2023.[name] She has been treated here from 02/24/2021-06/2021 PAIN:  Are you having pain? No, occasionally if I move unexpectedly  PRECAUTIONS: Right LE lymphedema, prior CVA,Anemia, cervical CA, HLD, HTN, A-Fib, s/p TKA   RED FLAGS: None   WEIGHT BEARING RESTRICTIONS: No  FALLS:  Has patient fallen in last 6 months? No  LIVING ENVIRONMENT: Lives with: lives with their spouse Lives in: House/apartment Stairs: No;    OCCUPATION: Retired  LEISURE: read, cross word puzzles  PRIOR LEVEL OF FUNCTION: Independent  PATIENT GOALS: Decrease swelling right LE   OBJECTIVE: Note: Objective measures were completed at Evaluation unless otherwise noted.  COGNITION: Overall cognitive status: Within functional limits for tasks assessed   PALPATION: No pitting edema  OBSERVATIONS / OTHER ASSESSMENTS: significant right LE swelling, non pitting, but relatively soft  SENSATION: Light touch: Deficits     POSTURE: Round shoulders, forward head  LYMPHEDEMA ASSESSMENTS:   SURGERY TYPE/DATE: 03/19/1984  NUMBER OF LYMPH NODES REMOVED: 40  CHEMOTHERAPY: yes  RADIATION:  HORMONE TREATMENT: no  INFECTIONS:   LYMPHEDEMA ASSESSMENTS:   LOWER EXTREMITY LANDMARK RIGHT eval RIGHT 11/25/2023 RIGHT 12/02/2023 RIGHT 12/18/2023 RIGHT 01/10/2024 RIGHT 01/24/2024  At groin 61.9       30 cm proximal to suprapatella        20 cm proximal to suprapatella 58.2  56.3  56 55.2  10 cm proximal to suprapatella 51.4  49.5  48 50..1  At midpatella / popliteal crease 38.1    37 36.7  30 cm proximal to floor at lateral plantar foot  41.9 40.5 38.8 39.7 40 40.5  20 cm proximal to floor at lateral plantar foot 39.2 39 35.2 37.6 36 37  10 cm proximal to floor at lateral plantar foot 32.7 31.4 30.0 32.3 29.9 30.4  Circumference of ankle/heel        5 cm proximal to 1st MTP joint 21.7    21.4 21.3  Across MTP joint 21.9       Around proximal great toe 8.0  7.5 7.7 7.8 7.8  (Blank rows = not tested)  LOWER EXTREMITY LANDMARK LEFT eval  At groin 59.5  30 cm proximal to suprapatella   20 cm proximal to suprapatella 56.5  10 cm proximal to suprapatella 45.2  At midpatella / popliteal crease 34.9  30 cm  proximal to floor at lateral plantar foot 37.9  20 cm proximal to floor at lateral plantar foot 30.6  10 cm proximal to floor at lateral plantar foot 22.8  Circumference of ankle/heel   5 cm proximal to 1st MTP joint 20.9  Across MTP joint 20.5  Around proximal great toe 7.3  (Blank rows = not tested)  FUNCTIONAL TESTS:    GAIT:WFL   Outcome measure:LLIS: 22%                                                                                                                            TREATMENT DATE:   01/29/2024 MLD to Rt LE: Short neck, superficial and deep abdominals, Rt axillary nodes, Rt ingiuno-axillary anastomosis, then Rt LE as follows: lateral thigh, medial to latera, 4 techniques at knee, ant and post lower leg, ankle, retro malleoli, dorsal foot/toes then retraced all steps.   Compression Bandaging to Rt LE as follows: Eucerin lotion, Medium TG soft foot to thigh, Small amt of artiflex to foot and ankle , then Comprilan soft 10 cm ankle to knee, and 15 cm  comprilan knee to thigh. Toes 1-3 with mollelast.  Wrapped 6 cm to foot Roman sandal, then 8 cm ASH pattern. 12 cm ankle to knee, then 12 cm below knee to mid thigh, and additional 12 cm knee to groin, then last 12 cm ankle to knee, and 12 cm knee to groin.    01/27/2024 MLD to Rt LE: Short neck, superficial and deep abdominals, Rt axillary nodes, Rt  ingiuno-axillary anastomosis, then Rt LE as follows: lateral thigh, medial to latera, 4 techniques at knee, ant and post lower leg, ankle, retro malleoli, dorsal foot/toes then retraced all steps.   Compression Bandaging to Rt LE as follows: Eucerin lotion, Medium TG soft foot to thigh, Small amt of artiflex to foot and ankle , then Comprilan soft 10 cm ankle to knee, and 15 cm  comprilan knee to thigh. Toes 1-3 with mollelast.  Wrapped 6 cm to foot Roman sandal, then 8 cm ASH pattern. 12 cm ankle to knee, then 12 cm below knee to mid thigh, and additional 12 cm knee to groin, then last 12 cm ankle to knee, and 12 cm knee to groin.    01/24/2024 Pt measured MLD to Rt LE: Short neck, superficial and deep abdominals, Rt axillary nodes, Rt ingiuno-axillary anastomosis, then Rt LE as follows: lateral thigh, medial to latera, 4 techniques at knee, ant and post lower leg, ankle, retro malleoli, dorsal foot/toes then retraced all steps.   Compression Bandaging to Rt LE as follows: Eucerin lotion, Medium TG soft foot to thigh, Small amt of artiflex to foot and ankle , then Comprilan soft 10 cm ankle to knee, and 15 cm  comprilan knee to thigh. Toes 1-3 with mollelast.  Wrapped 6 cm to foot Roman sandal, then 8 cm ASH pattern. 12 cm ankle to knee, then 12 cm below knee to mid thigh,  and additional 12 cm knee to groin, then last 12 cm ankle to knee, and 12 cm knee to groin.    01/20/2024 MLD to Rt LE: Short neck, superficial and deep abdominals, Rt axillary nodes, Rt ingiuno-axillary anastomosis, then Rt LE as follows: lateral thigh, medial to latera, 4 techniques at knee, ant and post lower leg, ankle, retro malleoli, dorsal foot/toes then retraced all steps.   Compression Bandaging to Rt LE as follows: Cocoa butter, Medium TG soft foot to thigh, Small amt of artiflex to foot and ankle and then at pop fossa, then Comprilan soft 10 cm ankle to knee, and 15 cm  comprilan knee to thigh. Did not wrap toes due to  bandage on toes.  Wrapped 6 cm to foot Roman sandal, then 8 cm ASH pattern. 12 cm ankle to knee, then 12 cm below knee to mid thigh, and additional 12 cm knee to groin, then last 12 cm mid shin to groin.    01/15/2024  MLD to Rt LE: Short neck, superficial and deep abdominals, Rt axillary nodes, Rt ingiuno-axillary anastomosis, then Rt LE as follows: lateral thigh, medial to latera, 4 techniques at knee, ant and post lower leg, ankle, retro malleoli, dorsal foot/toes then retraced all steps.   Compression Bandaging to Rt LE as follows: Cocoa butter, Medium TG soft foot to thigh, Small amt of artiflex to foot and ankle and then at pop fossa, then Comprilan soft 10 cm ankle to knee, and 15 cm  comprilan knee to thigh.  Wrapped 1st 3 toes with mollelast due to swelling, 6 cm to foot Roman sandal, then 8 cm ASH pattern. 12 cm ankle to knee, then 12 cm below knee to mid thigh, and additional 12 cm knee to groin, then last 12 cm mid shin to groin. Checked capillary refill and was good Pt measured for compression garment today  01/13/2024 MLD to Rt LE: Short neck, superficial and deep abdominals, Rt axillary nodes, Rt ingiuno-axillary anastomosis, then Rt LE as follows: lateral thigh, medial to latera, 4 techniques at knee, ant and post lower leg, ankle, retro malleoli, dorsal foot/toes then retraced all steps.   Compression Bandaging to Rt LE as follows: Cocoa butter, Medium TG soft foot to thigh, Small amt of artiflex to foot and ankle and then at pop fossa, then Comprilan soft 10 cm ankle to knee, and 15 cm  comprilan knee to thigh.  Wrapped 1st 3 toes with mollelast due to swelling, 6 cm to foot Roman sandal, then 8 cm ASH pattern. 12 cm ankle to knee, then 12 cm below knee to mid thigh, and additional 12 cm knee to groin, then last 12 cm mid shin to groin. Checked capillary refill and was good  01/10/2024 Measured Right LE MLD to Rt LE: Short neck, superficial and deep abdominals, Rt axillary nodes, Rt  ingiuno-axillary anastomosis, then Rt LE as follows: lateral thigh, medial to latera, 4 techniques at knee, ant and post lower leg, ankle, retro malleoli, dorsal foot/toes then retraced all steps.   Compression Bandaging to Rt LE as follows: Cocoa butter, Medium TG soft foot to thigh, Small amt of artiflex to foot and ankle and then at pop fossa, then Comprilan soft 10 cm ankle to knee, and 15 cm  comprilan knee to thigh.  Wrapped 1st 3 toes with mollelast due to swelling, 6 cm to foot Roman sandal, then 8 cm ASH pattern. 12 cm ankle to knee, then 12 cm below knee to mid thigh, and additional  12 cm knee to groin, then last 12 cm mid shin to groin. Checked capillary refill and was good  01/08/2024 Signed pt up for measuring on Wednesday Pts leg reduced after Mondays wrap. MLD to Rt LE: Short neck, superficial and deep abdominals, Rt axillary nodes, Rt ingiuno-axillary anastomosis, then Rt LE as follows: lateral thigh, medial to latera, 4 techniques at knee, ant and post lower leg, ankle, retro malleoli, dorsal foot/toes then retraced all steps.   Compression Bandaging to Rt LE as follows: Cocoa butter, Medium TG soft foot to thigh, Small amt of artiflex to foot and ankle and then at pop fossa, then Comprilan soft 10 cm ankle to knee, and 15 cm  comprilan knee to thigh.  Wrapped 1st 3 toes with mollelast due to swelling, 6 cm to foot Roman sandal, then 8 cm ASH pattern. 12 cm ankle to knee, then 12 cm below knee to mid thigh, and additional 12 cm knee to groin, then last 12 cm mid shin to groin. Checked capillary refill and was good Measure next   01/06/2024 Pts leg much more swollen today after going a week without compression due to ablation MLD to Rt LE: Short neck, superficial and deep abdominals, Rt axillary nodes, Rt ingiuno-axillary anastomosis, then Rt LE as follows: lateral thigh, medial to latera, 4 techniques at knee, ant and post lower leg, ankle, retro malleoli, dorsal foot/toes then retraced  all steps.   Compression Bandaging to Rt LE as follows: Cocoa butter, Medium TG soft foot to thigh, Small amt of artiflex to foot and ankle and then at pop fossa, then Comprilan soft 10 cm ankle to knee, and 15 cm  comprilan knee to thigh.  Wrapped 1st 3 toes with mollelast due to swelling, 6 cm to foot Roman sandal, then 8 cm ASH pattern. 12 cm ankle to knee, then 12 cm below knee to mid thigh, and additional 12 cm knee to groin, then last 12 cm mid shin to groin. Checked capillary refill and was good   12/30/2023 After discussion with Eward Sharps PT supervisor it was determined we should not treat pt for the remainder of this week due to possibility of increased pressure causing arterial bleeding. Cancelled appts this week, and pt will resume next week.   12/24/2023 MLD to Rt LE: Short neck, superficial and deep abdominals, Rt axillary nodes, Rt ingiuno-axillary anastomosis, then Rt LE as follows: lateral thigh, medial to latera, 4 techniques at knee, ant and post lower leg, ankle, retro malleoli, dorsal foot/toes then retraced all steps.   Compression Bandaging to Rt LE as follows: Cocoa butter, Medium TG soft foot to thigh, Small amt of artiflex to foot and ankle and then at pop fossa, then Comprilan soft 10 cm ankle to knee, and 15 cm  comprilan knee to thigh.  Wrapped 1st 3 toes with mollelast due to swelling, 6 cm to foot Roman sandal, then 8 cm ASH pattern. 12 cm ankle to knee, then 12 cm below knee to mid thigh, and additional 12 cm knee to groin, then last 12 cm mid shin to groin. Checked capillary refill and was good   12/18/2023 Measured lower leg; increased throughout secondary to pt not wearing stocking for 5/10 days while away on her trip MLD to Rt LE: Short neck, superficial and deep abdominals, Rt axillary nodes, Rt ingiuno-axillary anastomosis, then Rt LE as follows: lateral thigh, medial to latera, 4 techniques at knee, ant and post lower leg, ankle, retro malleoli, dorsal foot/toes  then  retraced all steps.   Compression Bandaging to Rt LE as follows: Cocoa butter, Medium TG soft foot to thigh, Small amt of artiflex to foot and ankle and then at pop fossa, then Comprilan soft 10 cm ankle to knee, and 15 cm  comprilan knee to thigh.  Wrapped 1st 3 toes with mollelast due to swelling, 6 cm to foot Roman sandal, then 8 cm ASH pattern. 12 cm ankle to knee, then 12 cm below knee to mid thigh, and additional 12 cm knee to groin, then last 12 cm mid shin to groin. Checked capillary refill and was good  12/05/2023 Manual Therapy MLD to Rt LE: Short neck, superficial and deep abdominals, Rt axillary nodes, Rt ingiuno-axillary anastomosis, then Rt LE as follows: lateral thigh, medial to latera, 4 techniques at knee, ant and post lower leg, ankle, retro malleoli, dorsal foot/toes then retraced all steps.   Compression Bandaging to Rt LE as follows: Cocoa butter, Medium TG soft foot to thigh, Small amt of artiflex to foot and ankle and then at pop fossa, then Comprilan soft 10 cm ankle to knee, and 15 cm  comprilan knee to thigh.  Wrapped 1st 3 toes with mollelast due to swelling, 6 cm to foot Roman sandal, then 8 cm ASH pattern. 12 cm ankle to knee, then 12 cm below knee to mid thigh, and additional 12 cm knee to groin, then last 12 cm mid shin to groin. Checked capillary refill and was good.   12/02/2023 Pt measured with excellent results despite being in stocking today Manual Therapy MLD to Rt LE: Short neck, superficial and deep abdominals, Rt axillary nodes, Rt ingiuno-axillary anastomosis, then Rt LE as follows: lateral thigh, medial to latera, 4 techniques at knee, ant and post lower leg, ankle, retro malleoli, dorsal foot/toes then retraced all steps.   Compression Bandaging to Rt LE as follows: Cocoa butter, Medium TG soft foot to thigh, Small amt of artiflex to foot and ankle and then at pop fossa, then Comprilan soft 10 cm ankle to knee, and 15 cm  comprilan knee to thigh.  Wrapped 1st  3 toes with mollelast due to swelling, 6 cm to foot Roman sandal, then 8 cm ASH pattern. 12 cm ankle to knee, then 12 cm below knee to mid thigh, and additional 12 cm knee to groin, then last 12 cm mid shin to groin. Checked capillary refill and was good.     11/29/2023 Manual Therapy MLD to Rt LE: Short neck, superficial and deep abdominals, Rt axillary nodes, Rt ingiuno-axillary anastomosis, then Rt LE as follows: lateral thigh, medial to latera, 4 techniques at knee, ant and post lower leg, ankle, retro malleoli, dorsal foot/toes then retraced all steps.   Compression Bandaging to Rt LE as follows: Cocoa butter, Medium TG soft foot to thigh, Small amt of artiflex to foot and ankle and then at pop fossa, then Comprilan soft 10 cm ankle to knee, and 15 cm  comprilan knee to thigh.  Wrapped 1st 3 toes with mollelast due to swelling, 6 cm to foot Roman sandal, then 8 cm ASH pattern. 12 cm ankle to knee, then 12 cm below knee to mid thigh, and additional 12 cm knee to groin, then last 12 cm mid shin to groin. Checked capillary refill and was good.    11/20/2023   PATIENT EDUCATION:  Education details: POC, LOS, TREATMENT interventions; Gave Leahs name to call re: adjusting Flexi, Discussed compression stockings maintain, vs bandaging reduces and is very impt  part of CDT in addition to MLD or pump. Person educated: Patient and Spouse Education method: Explanation Education comprehension: verbalized understanding  HOME EXERCISE PROGRAM:   ASSESSMENT:  CLINICAL IMPRESSION:  Script scanned to [employer].   Continue CDT until pt receives garments. EVAL Patient is a 73 y.o. female who was seen today for physical therapy evaluation and treatment for progression of her right  LE lymphedema. . She has lymphostatic lymphedema with congestion and hyperkeratosis. She has been treated for Right LE lymphedema most recently 2 years ago and has a Flexi touch that no longer fits properly due to 30 lb wt. Loss.   Her husband was previously independent in compression bandaging, and is willing to help his wife but will need review. We discussed importance of compression to reduce her leg as she questioned why her leg continues to swell despite using a pump..She has compression stockings but has not been wearing them. She will benefit from skilled therapy to address deficits and return pt to a more fxl lifestyle.  OBJECTIVE IMPAIRMENTS: decreased activity tolerance, decreased knowledge of condition, increased edema, and postural dysfunction.   ACTIVITY LIMITATIONS: locomotion level  PARTICIPATION LIMITATIONS: no restrictions  PERSONAL FACTORS: chronic lymphedema, abdominal surgery for cervical cancer removal with 40 lymph nodes removed, right total knee in June 2022.  are also affecting patient's functional outcome.   REHAB POTENTIAL: Good  CLINICAL DECISION MAKING: Stable/uncomplicated  EVALUATION COMPLEXITY: Low   GOALS: Goals reviewed with patient? Yes  SHORT TERM GOALS: Target date: 12/18/2023 // Pt/ husband will be able to do self compression bandaging  Baseline: Goal status: In Progress  2.  Pts husband will perform MLD or may use compression pump with MLD prn Baseline:  Goal status: Using compression pump after removing bandages 01/15/2024 3.  Pt will reduce at 10 cm prox to suprapatella by 1.5 cm Baseline:  Goal status: MET 01/10/2024 4.  Pt will reduce at 20 cm prox to floor by 2 cm Baseline:  Goal status: MET 01/10/2024  5.  Pt will call flexi touch and will have garment refit due to her wt. loss Baseline:  Goal status: MET 01/10/2024  LONG TERM GOALS: Target date: 01/15/2024  Pt will have appropriate day  time garments to control lymphedema Baseline:  Goal status: In Progress, ordered 01/15/2024 but not received 2.  Pt reports she knows how to control her lymphedema at home with lymph drainage manually or with pump, compression and exercise  Baseline:  Goal status:In  Progress  3.  Pt pt will reduce at 10 cm prox to patella by 3 cm to demonstrate improved swelling Baseline:  Goal status: MET 01/10/2024  4.  Pt will reduce at 20 cm prox to floor by 4.5 cmt o demonstrate improved swelling Baseline:  Goal status: In Progress  PLAN:  PT FREQUENCY: 1-2x/week PT DURATION: 4-6 weeks prn  PLANNED INTERVENTIONS: [id number]- PT Re-evaluation, 97110-Therapeutic exercises, 97535- Self Care, [id number]- Manual therapy, 97760- Orthotic Initial, [id number]- Orthotic/Prosthetic subsequent, Manual lymph drainage, and Compression bandaging  PLAN FOR NEXT SESSION: Cont to instruct pts husband in wrapping,   MLD, measure weekly. Garments ordered today. Script to be faxed to MD 01/16/2024   Grayce JINNY Sheldon, PT 01/29/2024, 9:57 AM

## 2024-01-31 ENCOUNTER — Ambulatory Visit: Payer: Self-pay

## 2024-01-31 DIAGNOSIS — Z85828 Personal history of other malignant neoplasm of skin: Secondary | ICD-10-CM | POA: Diagnosis not present

## 2024-01-31 DIAGNOSIS — L0889 Other specified local infections of the skin and subcutaneous tissue: Secondary | ICD-10-CM | POA: Diagnosis not present

## 2024-01-31 DIAGNOSIS — L821 Other seborrheic keratosis: Secondary | ICD-10-CM | POA: Diagnosis not present

## 2024-01-31 DIAGNOSIS — L578 Other skin changes due to chronic exposure to nonionizing radiation: Secondary | ICD-10-CM | POA: Diagnosis not present

## 2024-01-31 DIAGNOSIS — L570 Actinic keratosis: Secondary | ICD-10-CM | POA: Diagnosis not present

## 2024-01-31 DIAGNOSIS — L814 Other melanin hyperpigmentation: Secondary | ICD-10-CM | POA: Diagnosis not present

## 2024-01-31 DIAGNOSIS — D225 Melanocytic nevi of trunk: Secondary | ICD-10-CM | POA: Diagnosis not present

## 2024-01-31 DIAGNOSIS — L281 Prurigo nodularis: Secondary | ICD-10-CM | POA: Diagnosis not present

## 2024-02-03 ENCOUNTER — Ambulatory Visit

## 2024-02-05 ENCOUNTER — Ambulatory Visit

## 2024-02-07 ENCOUNTER — Ambulatory Visit

## 2024-02-10 ENCOUNTER — Ambulatory Visit

## 2024-02-10 DIAGNOSIS — C531 Malignant neoplasm of exocervix: Secondary | ICD-10-CM

## 2024-02-10 DIAGNOSIS — I890 Lymphedema, not elsewhere classified: Secondary | ICD-10-CM

## 2024-02-10 DIAGNOSIS — Z90710 Acquired absence of both cervix and uterus: Secondary | ICD-10-CM | POA: Diagnosis not present

## 2024-02-10 NOTE — Therapy (Signed)
 OUTPATIENT PHYSICAL THERAPY  LOWER EXTREMITY ONCOLOGY TREATMENT  Patient Name: Savannah Hill MRN: [id number] DOB:1950-07-16, 73 y.o., female Today's Date: 02/10/2024  END OF SESSION:  PT End of Session - 02/10/24 1500     Visit Number 30    Number of Visits 37    Date for Recertification  02/26/24    PT Start Time 1500    PT Stop Time 1553    PT Time Calculation (min) 53 min    Activity Tolerance Patient tolerated treatment well    Behavior During Therapy [facility] for tasks assessed/performed          Past Medical History:  Diagnosis Date   Anemia    borderline   Atrial fibrillation (HCC)    a. s/p ablation on 10/05/2016   Cancer (HCC)    cervical/rad hysterectomy/bso wiht chemo for small cell ca   Dyspnea    Heart valve disorder    HLD (hyperlipidemia)    Hypertension    Joint pain    Lower extremity edema    Obesity    Palpitations    Past Surgical History:  Procedure Laterality Date   ABLATION OF DYSRHYTHMIC FOCUS  10/05/2016   ATRIAL FIBRILLATION ABLATION N/A 10/05/2016   Procedure: Atrial Fibrillation Ablation;  Surgeon: Inocencio Soyla Lunger, MD;  Location: [facility] INVASIVE CV LAB;  Service: Cardiovascular;  Laterality: N/A;   ATRIAL FIBRILLATION ABLATION N/A 12/27/2023   Procedure: ATRIAL FIBRILLATION ABLATION;  Surgeon: Inocencio Soyla Lunger, MD;  Location: [facility] INVASIVE CV LAB;  Service: Cardiovascular;  Laterality: N/A;   IR RADIOLOGY PERIPHERAL GUIDED IV START  09/28/2016   IR US  GUIDE VASC ACCESS RIGHT  09/28/2016   RADICAL ABDOMINAL HYSTERECTOMY  1986   with BSO   REPLACEMENT TOTAL KNEE Right 2022   TEE WITHOUT CARDIOVERSION N/A 06/07/2016   Procedure: TRANSESOPHAGEAL ECHOCARDIOGRAM (TEE);  Surgeon: Jerel Balding, MD;  Location: [facility] ENDOSCOPY;  Service: Cardiovascular;  Laterality: N/A;   Patient Active Problem List   Diagnosis Date Noted   History of CVA (cerebrovascular accident) 05/28/2021   Hyperlipidemia 06/17/2019   Class 1 obesity due to excess  calories with serious comorbidity and body mass index (BMI) of 32.0 to 32.9 in adult 12/05/2018   Postmenopausal 12/05/2018   Family history of diabetes mellitus in father 12/05/2018   Impaired fasting glucose 12/05/2018   Small cell carcinoma (HCC)- of the cervix 12/05/2018   Hip pain, bilateral 05/15/2018   Essential hypertension 01/29/2018   Chronic insomnia 01/29/2018   MDD (major depressive disorder), single episode 01/29/2018   Excessive drinking of alcohol  01/29/2018   Chronic anticoagulation 10/06/2016   Mitral valve disease    PAF (paroxysmal atrial fibrillation) (HCC) 07/01/2013   LAFB (left anterior fascicular block) 07/01/2013   Lymphedema 07/01/2013   h/o Cervical cancer 10/01/2011   H/O total hysterectomy 10/01/2011    PCP:   REFERRING PROVIDER: Ronal Pinal, MD  REFERRING DIAG: Right LE Lymphedema  THERAPY DIAG:  Malignant neoplasm of exocervix (HCC)  Lymphedema, not elsewhere classified  S/P total hysterectomy  ONSET DATE: April 2025 exacerbation  Rationale for Evaluation and Treatment: Rehabilitation  SUBJECTIVE:  SUBJECTIVE STATEMENT   Pt. Has missed appts due to having 6-7 places on her right lower  leg  frozen by her dermatologist.  She was getting some drainage from them last week so we did not wrap.  Her leg is more swollen.   EVAL I have not been wearing garments but I have been using the pump every night. I am not using the Flexi Touch because It doesn't fit properly since I lost 30 lbs and I don't know how to adjust it. I have been using a basic pump and it does OK and my leg reduces but by the next day swelling returns. She has an Ablation for A Fib October 10. In 2 weeks we go to the [facility]  for 10 days. Pt has old compression stockings, a Flexi touch and  another basic style pump, and a Sigvaris night garment.   PERTINENT HISTORY:  radical hysterectomy in 1986 for small cell cervical endocrine cancer with 40 pelvic lymph nodes removed with chemo, no radiation. She developed some swelling after the procedure but has noticed and increase as she has gotten older and with some weight gain. She has a totat knee June 2022 and has had an increase in lymphedema since. She has an extremity pump from [employer] that she got in June 2022 and a Flexi touch that she received in 2023.[name] She has been treated here from 02/24/2021-06/2021 PAIN:  Are you having pain? No, occasionally if I move unexpectedly  PRECAUTIONS: Right LE lymphedema, prior CVA,Anemia, cervical CA, HLD, HTN, A-Fib, s/p TKA   RED FLAGS: None   WEIGHT BEARING RESTRICTIONS: No  FALLS:  Has patient fallen in last 6 months? No  LIVING ENVIRONMENT: Lives with: lives with their spouse Lives in: House/apartment Stairs: No;    OCCUPATION: Retired  LEISURE: read, cross word puzzles  PRIOR LEVEL OF FUNCTION: Independent  PATIENT GOALS: Decrease swelling right LE   OBJECTIVE: Note: Objective measures were completed at Evaluation unless otherwise noted.  COGNITION: Overall cognitive status: Within functional limits for tasks assessed   PALPATION: No pitting edema  OBSERVATIONS / OTHER ASSESSMENTS: significant right LE swelling, non pitting, but relatively soft  SENSATION: Light touch: Deficits     POSTURE: Round shoulders, forward head  LYMPHEDEMA ASSESSMENTS:   SURGERY TYPE/DATE: 03/19/1984  NUMBER OF LYMPH NODES REMOVED: 40  CHEMOTHERAPY: yes  RADIATION:  HORMONE TREATMENT: no  INFECTIONS:   LYMPHEDEMA ASSESSMENTS:   LOWER EXTREMITY LANDMARK RIGHT eval RIGHT 11/25/2023 RIGHT 12/02/2023 RIGHT 12/18/2023 RIGHT 01/10/2024 RIGHT 01/24/2024 RIGHT 02/10/2024  At groin 61.9        30 cm proximal to suprapatella         20 cm proximal to suprapatella 58.2  56.3  56 55.2    10 cm proximal to suprapatella 51.4  49.5  48 50..1   At midpatella / popliteal crease 38.1    37 36.7   30 cm proximal to floor at lateral plantar foot 41.9 40.5 38.8 39.7 40 40.5 43.3  20 cm proximal to floor at lateral plantar foot 39.2 39 35.2 37.6 36 37 40.8  10 cm proximal to floor at lateral plantar foot 32.7 31.4 30.0 32.3 29.9 30.4 35.7  Circumference of ankle/heel         5 cm proximal to 1st MTP joint 21.7    21.4 21.3   Across MTP joint 21.9      23.4  Around proximal great toe 8.0  7.5 7.7 7.8 7.8   (Blank  rows = not tested)  LOWER EXTREMITY LANDMARK LEFT eval  At groin 59.5  30 cm proximal to suprapatella   20 cm proximal to suprapatella 56.5  10 cm proximal to suprapatella 45.2  At midpatella / popliteal crease 34.9  30 cm proximal to floor at lateral plantar foot 37.9  20 cm proximal to floor at lateral plantar foot 30.6  10 cm proximal to floor at lateral plantar foot 22.8  Circumference of ankle/heel   5 cm proximal to 1st MTP joint 20.9  Across MTP joint 20.5  Around proximal great toe 7.3  (Blank rows = not tested)  FUNCTIONAL TESTS:    GAIT:WFL   Outcome measure:LLIS: 22%                                                                                                                            TREATMENT DATE:   02/10/2024 Measured right lower leg; significantly more swollen MLD to Rt LE: Short neck, superficial and deep abdominals, Rt axillary nodes, Rt ingiuno-axillary anastomosis, then Rt LE as follows: lateral thigh, medial to latera, 4 techniques at knee, ant and post lower leg, ankle, retro malleoli, dorsal foot/toes then retraced all steps.   Compression Bandaging to Rt LE as follows: Eucerin lotion, Medium TG soft foot to thigh, Small amt of artiflex to foot and ankle , then Comprilan soft 10 cm ankle to knee, and 15 cm  comprilan knee to thigh. Toes 1-3 with mollelast.  Wrapped 6 cm to foot Roman sandal, then 8 cm ASH pattern. 12 cm ankle to  knee, then 12 cm below knee to mid thigh, and additional 12 cm knee to groin, then last 12 cm ankle to knee, and 12 cm knee to groin.   01/29/2024 MLD to Rt LE: Short neck, superficial and deep abdominals, Rt axillary nodes, Rt ingiuno-axillary anastomosis, then Rt LE as follows: lateral thigh, medial to latera, 4 techniques at knee, ant and post lower leg, ankle, retro malleoli, dorsal foot/toes then retraced all steps.   Compression Bandaging to Rt LE as follows: Eucerin lotion, Medium TG soft foot to thigh, Small amt of artiflex to foot and ankle , then Comprilan soft 10 cm ankle to knee, and 15 cm  comprilan knee to thigh. Toes 1-3 with mollelast.  Wrapped 6 cm to foot Roman sandal, then 8 cm ASH pattern. 12 cm ankle to knee, then 12 cm below knee to mid thigh, and additional 12 cm knee to groin, then last 12 cm ankle to knee, and 12 cm knee to groin.    01/27/2024 MLD to Rt LE: Short neck, superficial and deep abdominals, Rt axillary nodes, Rt ingiuno-axillary anastomosis, then Rt LE as follows: lateral thigh, medial to latera, 4 techniques at knee, ant and post lower leg, ankle, retro malleoli, dorsal foot/toes then retraced all steps.   Compression Bandaging to Rt LE as follows: Eucerin lotion, Medium TG soft foot to thigh, Small amt of artiflex to foot  and ankle , then Comprilan soft 10 cm ankle to knee, and 15 cm  comprilan knee to thigh. Toes 1-3 with mollelast.  Wrapped 6 cm to foot Roman sandal, then 8 cm ASH pattern. 12 cm ankle to knee, then 12 cm below knee to mid thigh, and additional 12 cm knee to groin, then last 12 cm ankle to knee, and 12 cm knee to groin.    01/24/2024 Pt measured MLD to Rt LE: Short neck, superficial and deep abdominals, Rt axillary nodes, Rt ingiuno-axillary anastomosis, then Rt LE as follows: lateral thigh, medial to latera, 4 techniques at knee, ant and post lower leg, ankle, retro malleoli, dorsal foot/toes then retraced all steps.   Compression Bandaging to  Rt LE as follows: Eucerin lotion, Medium TG soft foot to thigh, Small amt of artiflex to foot and ankle , then Comprilan soft 10 cm ankle to knee, and 15 cm  comprilan knee to thigh. Toes 1-3 with mollelast.  Wrapped 6 cm to foot Roman sandal, then 8 cm ASH pattern. 12 cm ankle to knee, then 12 cm below knee to mid thigh, and additional 12 cm knee to groin, then last 12 cm ankle to knee, and 12 cm knee to groin.    01/20/2024 MLD to Rt LE: Short neck, superficial and deep abdominals, Rt axillary nodes, Rt ingiuno-axillary anastomosis, then Rt LE as follows: lateral thigh, medial to latera, 4 techniques at knee, ant and post lower leg, ankle, retro malleoli, dorsal foot/toes then retraced all steps.   Compression Bandaging to Rt LE as follows: Cocoa butter, Medium TG soft foot to thigh, Small amt of artiflex to foot and ankle and then at pop fossa, then Comprilan soft 10 cm ankle to knee, and 15 cm  comprilan knee to thigh. Did not wrap toes due to bandage on toes.  Wrapped 6 cm to foot Roman sandal, then 8 cm ASH pattern. 12 cm ankle to knee, then 12 cm below knee to mid thigh, and additional 12 cm knee to groin, then last 12 cm mid shin to groin.    01/15/2024  MLD to Rt LE: Short neck, superficial and deep abdominals, Rt axillary nodes, Rt ingiuno-axillary anastomosis, then Rt LE as follows: lateral thigh, medial to latera, 4 techniques at knee, ant and post lower leg, ankle, retro malleoli, dorsal foot/toes then retraced all steps.   Compression Bandaging to Rt LE as follows: Cocoa butter, Medium TG soft foot to thigh, Small amt of artiflex to foot and ankle and then at pop fossa, then Comprilan soft 10 cm ankle to knee, and 15 cm  comprilan knee to thigh.  Wrapped 1st 3 toes with mollelast due to swelling, 6 cm to foot Roman sandal, then 8 cm ASH pattern. 12 cm ankle to knee, then 12 cm below knee to mid thigh, and additional 12 cm knee to groin, then last 12 cm mid shin to groin. Checked capillary  refill and was good Pt measured for compression garment today  01/13/2024 MLD to Rt LE: Short neck, superficial and deep abdominals, Rt axillary nodes, Rt ingiuno-axillary anastomosis, then Rt LE as follows: lateral thigh, medial to latera, 4 techniques at knee, ant and post lower leg, ankle, retro malleoli, dorsal foot/toes then retraced all steps.   Compression Bandaging to Rt LE as follows: Cocoa butter, Medium TG soft foot to thigh, Small amt of artiflex to foot and ankle and then at pop fossa, then Comprilan soft 10 cm ankle to knee, and 15 cm  comprilan knee to thigh.  Wrapped 1st 3 toes with mollelast due to swelling, 6 cm to foot Roman sandal, then 8 cm ASH pattern. 12 cm ankle to knee, then 12 cm below knee to mid thigh, and additional 12 cm knee to groin, then last 12 cm mid shin to groin. Checked capillary refill and was good  01/10/2024 Measured Right LE MLD to Rt LE: Short neck, superficial and deep abdominals, Rt axillary nodes, Rt ingiuno-axillary anastomosis, then Rt LE as follows: lateral thigh, medial to latera, 4 techniques at knee, ant and post lower leg, ankle, retro malleoli, dorsal foot/toes then retraced all steps.   Compression Bandaging to Rt LE as follows: Cocoa butter, Medium TG soft foot to thigh, Small amt of artiflex to foot and ankle and then at pop fossa, then Comprilan soft 10 cm ankle to knee, and 15 cm  comprilan knee to thigh.  Wrapped 1st 3 toes with mollelast due to swelling, 6 cm to foot Roman sandal, then 8 cm ASH pattern. 12 cm ankle to knee, then 12 cm below knee to mid thigh, and additional 12 cm knee to groin, then last 12 cm mid shin to groin. Checked capillary refill and was good  01/08/2024 Signed pt up for measuring on Wednesday Pts leg reduced after Mondays wrap. MLD to Rt LE: Short neck, superficial and deep abdominals, Rt axillary nodes, Rt ingiuno-axillary anastomosis, then Rt LE as follows: lateral thigh, medial to latera, 4 techniques at knee, ant  and post lower leg, ankle, retro malleoli, dorsal foot/toes then retraced all steps.   Compression Bandaging to Rt LE as follows: Cocoa butter, Medium TG soft foot to thigh, Small amt of artiflex to foot and ankle and then at pop fossa, then Comprilan soft 10 cm ankle to knee, and 15 cm  comprilan knee to thigh.  Wrapped 1st 3 toes with mollelast due to swelling, 6 cm to foot Roman sandal, then 8 cm ASH pattern. 12 cm ankle to knee, then 12 cm below knee to mid thigh, and additional 12 cm knee to groin, then last 12 cm mid shin to groin. Checked capillary refill and was good Measure next   01/06/2024 Pts leg much more swollen today after going a week without compression due to ablation MLD to Rt LE: Short neck, superficial and deep abdominals, Rt axillary nodes, Rt ingiuno-axillary anastomosis, then Rt LE as follows: lateral thigh, medial to latera, 4 techniques at knee, ant and post lower leg, ankle, retro malleoli, dorsal foot/toes then retraced all steps.   Compression Bandaging to Rt LE as follows: Cocoa butter, Medium TG soft foot to thigh, Small amt of artiflex to foot and ankle and then at pop fossa, then Comprilan soft 10 cm ankle to knee, and 15 cm  comprilan knee to thigh.  Wrapped 1st 3 toes with mollelast due to swelling, 6 cm to foot Roman sandal, then 8 cm ASH pattern. 12 cm ankle to knee, then 12 cm below knee to mid thigh, and additional 12 cm knee to groin, then last 12 cm mid shin to groin. Checked capillary refill and was good   12/30/2023 After discussion with Eward Sharps PT supervisor it was determined we should not treat pt for the remainder of this week due to possibility of increased pressure causing arterial bleeding. Cancelled appts this week, and pt will resume next week.   12/24/2023 MLD to Rt LE: Short neck, superficial and deep abdominals, Rt axillary nodes, Rt ingiuno-axillary anastomosis, then Rt LE  as follows: lateral thigh, medial to latera, 4 techniques at knee, ant  and post lower leg, ankle, retro malleoli, dorsal foot/toes then retraced all steps.   Compression Bandaging to Rt LE as follows: Cocoa butter, Medium TG soft foot to thigh, Small amt of artiflex to foot and ankle and then at pop fossa, then Comprilan soft 10 cm ankle to knee, and 15 cm  comprilan knee to thigh.  Wrapped 1st 3 toes with mollelast due to swelling, 6 cm to foot Roman sandal, then 8 cm ASH pattern. 12 cm ankle to knee, then 12 cm below knee to mid thigh, and additional 12 cm knee to groin, then last 12 cm mid shin to groin. Checked capillary refill and was good   12/18/2023 Measured lower leg; increased throughout secondary to pt not wearing stocking for 5/10 days while away on her trip MLD to Rt LE: Short neck, superficial and deep abdominals, Rt axillary nodes, Rt ingiuno-axillary anastomosis, then Rt LE as follows: lateral thigh, medial to latera, 4 techniques at knee, ant and post lower leg, ankle, retro malleoli, dorsal foot/toes then retraced all steps.   Compression Bandaging to Rt LE as follows: Cocoa butter, Medium TG soft foot to thigh, Small amt of artiflex to foot and ankle and then at pop fossa, then Comprilan soft 10 cm ankle to knee, and 15 cm  comprilan knee to thigh.  Wrapped 1st 3 toes with mollelast due to swelling, 6 cm to foot Roman sandal, then 8 cm ASH pattern. 12 cm ankle to knee, then 12 cm below knee to mid thigh, and additional 12 cm knee to groin, then last 12 cm mid shin to groin. Checked capillary refill and was good  12/05/2023 Manual Therapy MLD to Rt LE: Short neck, superficial and deep abdominals, Rt axillary nodes, Rt ingiuno-axillary anastomosis, then Rt LE as follows: lateral thigh, medial to latera, 4 techniques at knee, ant and post lower leg, ankle, retro malleoli, dorsal foot/toes then retraced all steps.   Compression Bandaging to Rt LE as follows: Cocoa butter, Medium TG soft foot to thigh, Small amt of artiflex to foot and ankle and then at pop  fossa, then Comprilan soft 10 cm ankle to knee, and 15 cm  comprilan knee to thigh.  Wrapped 1st 3 toes with mollelast due to swelling, 6 cm to foot Roman sandal, then 8 cm ASH pattern. 12 cm ankle to knee, then 12 cm below knee to mid thigh, and additional 12 cm knee to groin, then last 12 cm mid shin to groin. Checked capillary refill and was good.   12/02/2023 Pt measured with excellent results despite being in stocking today Manual Therapy MLD to Rt LE: Short neck, superficial and deep abdominals, Rt axillary nodes, Rt ingiuno-axillary anastomosis, then Rt LE as follows: lateral thigh, medial to latera, 4 techniques at knee, ant and post lower leg, ankle, retro malleoli, dorsal foot/toes then retraced all steps.   Compression Bandaging to Rt LE as follows: Cocoa butter, Medium TG soft foot to thigh, Small amt of artiflex to foot and ankle and then at pop fossa, then Comprilan soft 10 cm ankle to knee, and 15 cm  comprilan knee to thigh.  Wrapped 1st 3 toes with mollelast due to swelling, 6 cm to foot Roman sandal, then 8 cm ASH pattern. 12 cm ankle to knee, then 12 cm below knee to mid thigh, and additional 12 cm knee to groin, then last 12 cm mid shin to groin. Checked capillary  refill and was good.     11/29/2023 Manual Therapy MLD to Rt LE: Short neck, superficial and deep abdominals, Rt axillary nodes, Rt ingiuno-axillary anastomosis, then Rt LE as follows: lateral thigh, medial to latera, 4 techniques at knee, ant and post lower leg, ankle, retro malleoli, dorsal foot/toes then retraced all steps.   Compression Bandaging to Rt LE as follows: Cocoa butter, Medium TG soft foot to thigh, Small amt of artiflex to foot and ankle and then at pop fossa, then Comprilan soft 10 cm ankle to knee, and 15 cm  comprilan knee to thigh.  Wrapped 1st 3 toes with mollelast due to swelling, 6 cm to foot Roman sandal, then 8 cm ASH pattern. 12 cm ankle to knee, then 12 cm below knee to mid thigh, and additional 12  cm knee to groin, then last 12 cm mid shin to groin. Checked capillary refill and was good.    11/20/2023   PATIENT EDUCATION:  Education details: POC, LOS, TREATMENT interventions; Twyla Mort name to call re: adjusting Flexi, Discussed compression stockings maintain, vs bandaging reduces and is very impt part of CDT in addition to MLD or pump. Person educated: Patient and Spouse Education method: Explanation Education comprehension: verbalized understanding  HOME EXERCISE PROGRAM:   ASSESSMENT:  CLINICAL IMPRESSION: Pts right lower leg with significantly more swelling since being out of wraps for a week and a half after dermatology procedure. Initiated compression bandaging again today. Pt will remove if having pain or discomfort or if wraps slide. She may try her heavier wt stocking as needed for swelling.  EVAL Patient is a 73 y.o. female who was seen today for physical therapy evaluation and treatment for progression of her right  LE lymphedema. . She has lymphostatic lymphedema with congestion and hyperkeratosis. She has been treated for Right LE lymphedema most recently 2 years ago and has a Flexi touch that no longer fits properly due to 30 lb wt. Loss.  Her husband was previously independent in compression bandaging, and is willing to help his wife but will need review. We discussed importance of compression to reduce her leg as she questioned why her leg continues to swell despite using a pump..She has compression stockings but has not been wearing them. She will benefit from skilled therapy to address deficits and return pt to a more fxl lifestyle.  OBJECTIVE IMPAIRMENTS: decreased activity tolerance, decreased knowledge of condition, increased edema, and postural dysfunction.   ACTIVITY LIMITATIONS: locomotion level  PARTICIPATION LIMITATIONS: no restrictions  PERSONAL FACTORS: chronic lymphedema, abdominal surgery for cervical cancer removal with 40 lymph nodes removed, right  total knee in June 2022.  are also affecting patient's functional outcome.   REHAB POTENTIAL: Good  CLINICAL DECISION MAKING: Stable/uncomplicated  EVALUATION COMPLEXITY: Low   GOALS: Goals reviewed with patient? Yes  SHORT TERM GOALS: Target date: 12/18/2023 // Pt/ husband will be able to do self compression bandaging  Baseline: Goal status: In Progress  2.  Pts husband will perform MLD or may use compression pump with MLD prn Baseline:  Goal status: Using compression pump after removing bandages 01/15/2024 3.  Pt will reduce at 10 cm prox to suprapatella by 1.5 cm Baseline:  Goal status: MET 01/10/2024 4.  Pt will reduce at 20 cm prox to floor by 2 cm Baseline:  Goal status: MET 01/10/2024  5.  Pt will call flexi touch and will have garment refit due to her wt. loss Baseline:  Goal status: MET 01/10/2024  LONG TERM  GOALS: Target date: 01/15/2024  Pt will have appropriate day  time garments to control lymphedema Baseline:  Goal status: In Progress, ordered 01/15/2024 but not received 2.  Pt reports she knows how to control her lymphedema at home with lymph drainage manually or with pump, compression and exercise  Baseline:  Goal status:In Progress  3.  Pt pt will reduce at 10 cm prox to patella by 3 cm to demonstrate improved swelling Baseline:  Goal status: MET 01/10/2024  4.  Pt will reduce at 20 cm prox to floor by 4.5 cmt o demonstrate improved swelling Baseline:  Goal status: In Progress  PLAN:  PT FREQUENCY: 1-2x/week PT DURATION: 4-6 weeks prn  PLANNED INTERVENTIONS: [id number]- PT Re-evaluation, 97110-Therapeutic exercises, 97535- Self Care, [id number]- Manual therapy, 97760- Orthotic Initial, [id number]- Orthotic/Prosthetic subsequent, Manual lymph drainage, and Compression bandaging  PLAN FOR NEXT SESSION: Cont to instruct pts husband in wrapping,   MLD, measure weekly. Garments ordered today. Script to be faxed to MD 01/16/2024   Grayce JINNY Sheldon, PT 02/10/2024,  3:56 PM

## 2024-02-12 ENCOUNTER — Ambulatory Visit

## 2024-02-12 DIAGNOSIS — I890 Lymphedema, not elsewhere classified: Secondary | ICD-10-CM

## 2024-02-12 DIAGNOSIS — Z90710 Acquired absence of both cervix and uterus: Secondary | ICD-10-CM

## 2024-02-12 DIAGNOSIS — C531 Malignant neoplasm of exocervix: Secondary | ICD-10-CM

## 2024-02-12 NOTE — Therapy (Signed)
 OUTPATIENT PHYSICAL THERAPY  LOWER EXTREMITY ONCOLOGY TREATMENT  Patient Name: Savannah Hill MRN: [id number] DOB:Jul 31, 1950, 73 y.o., female Today's Date: 02/12/2024  END OF SESSION:  PT End of Session - 02/12/24 1101     Visit Number 31    Number of Visits 37    Date for Recertification  02/26/24    PT Start Time 1101    Activity Tolerance Patient tolerated treatment well    Behavior During Therapy Savannah Hill for tasks assessed/performed          Past Medical History:  Diagnosis Date   Anemia    borderline   Atrial fibrillation (HCC)    a. s/p ablation on 10/05/2016   Cancer (HCC)    cervical/rad hysterectomy/bso wiht chemo for small cell ca   Dyspnea    Heart valve disorder    HLD (hyperlipidemia)    Hypertension    Joint pain    Lower extremity edema    Obesity    Palpitations    Past Surgical History:  Procedure Laterality Date   ABLATION OF DYSRHYTHMIC FOCUS  10/05/2016   ATRIAL FIBRILLATION ABLATION N/A 10/05/2016   Procedure: Atrial Fibrillation Ablation;  Surgeon: Savannah Soyla Lunger, MD;  Location: [facility];  Service: Cardiovascular;  Laterality: N/A;   ATRIAL FIBRILLATION ABLATION N/A 12/27/2023   Procedure: ATRIAL FIBRILLATION ABLATION;  Surgeon: Savannah Soyla Lunger, MD;  Location: [facility];  Service: Cardiovascular;  Laterality: N/A;   IR RADIOLOGY PERIPHERAL GUIDED IV START  09/28/2016   IR US  GUIDE VASC ACCESS RIGHT  09/28/2016   RADICAL ABDOMINAL HYSTERECTOMY  1986   with BSO   REPLACEMENT TOTAL KNEE Right 2022   TEE WITHOUT CARDIOVERSION N/A 06/07/2016   Procedure: TRANSESOPHAGEAL ECHOCARDIOGRAM (TEE);  Surgeon: Savannah Balding, MD;  Location: [facility] ENDOSCOPY;  Service: Cardiovascular;  Laterality: N/A;   Patient Active Problem List   Diagnosis Date Noted   History of CVA (cerebrovascular accident) 05/28/2021   Hyperlipidemia 06/17/2019   Class 1 obesity due to excess calories with serious comorbidity and body mass index (BMI)  of 32.0 to 32.9 in adult 12/05/2018   Postmenopausal 12/05/2018   Family history of diabetes mellitus in father 12/05/2018   Impaired fasting glucose 12/05/2018   Small cell carcinoma (HCC)- of the cervix 12/05/2018   Hip pain, bilateral 05/15/2018   Essential hypertension 01/29/2018   Chronic insomnia 01/29/2018   MDD (major depressive disorder), single episode 01/29/2018   Excessive drinking of alcohol  01/29/2018   Chronic anticoagulation 10/06/2016   Mitral valve disease    PAF (paroxysmal atrial fibrillation) (HCC) 07/01/2013   LAFB (left anterior fascicular block) 07/01/2013   Lymphedema 07/01/2013   h/o Cervical cancer 10/01/2011   H/O total hysterectomy 10/01/2011    PCP:   REFERRING PROVIDER: Ronal Pinal, MD  REFERRING DIAG: Right LE Lymphedema  THERAPY DIAG:  Malignant neoplasm of exocervix (HCC)  Lymphedema, not elsewhere classified  S/P total hysterectomy  ONSET DATE: April 2025 exacerbation  Rationale for Evaluation and Treatment: Rehabilitation  SUBJECTIVE:  SUBJECTIVE STATEMENT I lost 4 lbs between Monday am and today. I peed and peed on Monday. I took the wrap off today. My leg looks really good too.  EVAL I have not been wearing garments but I have been using the pump every night. I am not using the Savannah Hill because It doesn't fit properly since I lost 30 lbs and I don't know how to adjust it. I have been using a basic pump and it does OK and my leg reduces but by the next day swelling returns. She has an Ablation for A Fib October 10. In 2 weeks we go to the Savannah Hill  for 10 days. Pt has old compression stockings, a Savannah Hill and another basic style pump, and a Savannah Hill night garment.   PERTINENT HISTORY:  radical hysterectomy in 1986 for small cell cervical  endocrine cancer with 40 pelvic lymph nodes removed with chemo, no radiation. She developed some swelling after the procedure but has noticed and increase as she has gotten older and with some weight gain. She has a totat knee June 2022 and has had an increase in lymphedema since. She has an extremity pump from Savannah Hill that she got in June 2022 and a Savannah Hill that she received in 2023.Savannah Hill She has been treated here from 02/24/2021-06/2021 PAIN:  Are you having pain? No, occasionally if I move unexpectedly  PRECAUTIONS: Right LE lymphedema, prior CVA,Anemia, cervical CA, HLD, HTN, A-Fib, s/p TKA   RED FLAGS: None   WEIGHT BEARING RESTRICTIONS: No  FALLS:  Has patient fallen in last 6 months? No  LIVING ENVIRONMENT: Lives with: lives with their spouse Lives in: House/apartment Stairs: No;    OCCUPATION: Retired  LEISURE: read, cross word puzzles  PRIOR LEVEL OF FUNCTION: Independent  PATIENT GOALS: Decrease swelling right LE   OBJECTIVE: Note: Objective measures were completed at Evaluation unless otherwise noted.  COGNITION: Overall cognitive status: Within functional limits for tasks assessed   PALPATION: No pitting edema  OBSERVATIONS / OTHER ASSESSMENTS: significant right LE swelling, non pitting, but relatively soft  SENSATION: [place]: Deficits     POSTURE: Round shoulders, forward head  LYMPHEDEMA ASSESSMENTS:   SURGERY TYPE/DATE: 03/19/1984  NUMBER OF LYMPH NODES REMOVED: 40  CHEMOTHERAPY: yes  RADIATION:  HORMONE TREATMENT: no  INFECTIONS:   LYMPHEDEMA ASSESSMENTS:   LOWER EXTREMITY LANDMARK RIGHT eval RIGHT 11/25/2023 RIGHT 12/02/2023 RIGHT 12/18/2023 RIGHT 01/10/2024 RIGHT 01/24/2024 RIGHT 02/10/2024  At groin 61.9        30 cm proximal to suprapatella         20 cm proximal to suprapatella 58.2  56.3  56 55.2   10 cm proximal to suprapatella 51.4  49.5  48 50..1   At midpatella / popliteal crease 38.1    37 36.7   30 cm proximal to floor  at lateral plantar foot 41.9 40.5 38.8 39.7 40 40.5 43.3  20 cm proximal to floor at lateral plantar foot 39.2 39 35.2 37.6 36 37 40.8  10 cm proximal to floor at lateral plantar foot 32.7 31.4 30.0 32.3 29.9 30.4 35.7  Circumference of ankle/heel         5 cm proximal to 1st MTP joint 21.7    21.4 21.3   Across MTP joint 21.9      23.4  Around proximal great toe 8.0  7.5 7.7 7.8 7.8   (Blank rows = not tested)  LOWER EXTREMITY LANDMARK LEFT eval  At groin 59.5  30 cm  proximal to suprapatella   20 cm proximal to suprapatella 56.5  10 cm proximal to suprapatella 45.2  At midpatella / popliteal crease 34.9  30 cm proximal to floor at lateral plantar foot 37.9  20 cm proximal to floor at lateral plantar foot 30.6  10 cm proximal to floor at lateral plantar foot 22.8  Circumference of ankle/heel   5 cm proximal to 1st MTP joint 20.9  Across MTP joint 20.5  Around proximal great toe 7.3  (Blank rows = not tested)  FUNCTIONAL TESTS:    GAIT:WFL   Outcome measure:LLIS: 22%                                                                                                                            TREATMENT DATE:     02/12/2024  MLD to Rt LE: Short neck, superficial and deep abdominals, Rt axillary nodes, Rt ingiuno-axillary anastomosis, then Rt LE as follows: lateral thigh, medial to latera, 4 techniques at knee, ant and post lower leg, ankle, retro malleoli, dorsal foot/toes then retraced all steps.   Compression Bandaging to Rt LE as follows: Eucerin lotion, Medium TG soft foot to thigh, Small amt of artiflex to foot and ankle , then Comprilan soft 10 cm ankle to knee, and 15 cm  comprilan knee to thigh. Toes 1-3 with mollelast.  Wrapped 6 cm to foot Roman sandal, then 8 cm ASH pattern. 12 cm ankle to knee, then 12 cm below knee to mid thigh, and additional 12 cm knee to groin, then last 12 cm ankle to knee, and 12 cm knee to groin.    02/10/2024 Measured right lower leg;  significantly more swollen MLD to Rt LE: Short neck, superficial and deep abdominals, Rt axillary nodes, Rt ingiuno-axillary anastomosis, then Rt LE as follows: lateral thigh, medial to latera, 4 techniques at knee, ant and post lower leg, ankle, retro malleoli, dorsal foot/toes then retraced all steps.   Compression Bandaging to Rt LE as follows: Eucerin lotion, Medium TG soft foot to thigh, Small amt of artiflex to foot and ankle , then Comprilan soft 10 cm ankle to knee, and 15 cm  comprilan knee to thigh. Toes 1-3 with mollelast.  Wrapped 6 cm to foot Roman sandal, then 8 cm ASH pattern. 12 cm ankle to knee, then 12 cm below knee to mid thigh, and additional 12 cm knee to groin, then last 12 cm ankle to knee, and 12 cm knee to groin.   01/29/2024 MLD to Rt LE: Short neck, superficial and deep abdominals, Rt axillary nodes, Rt ingiuno-axillary anastomosis, then Rt LE as follows: lateral thigh, medial to latera, 4 techniques at knee, ant and post lower leg, ankle, retro malleoli, dorsal foot/toes then retraced all steps.   Compression Bandaging to Rt LE as follows: Eucerin lotion, Medium TG soft foot to thigh, Small amt of artiflex to foot and ankle , then Comprilan soft 10 cm ankle to knee, and 15 cm  comprilan knee to thigh. Toes 1-3 with mollelast.  Wrapped 6 cm to foot Roman sandal, then 8 cm ASH pattern. 12 cm ankle to knee, then 12 cm below knee to mid thigh, and additional 12 cm knee to groin, then last 12 cm ankle to knee, and 12 cm knee to groin.    01/27/2024 MLD to Rt LE: Short neck, superficial and deep abdominals, Rt axillary nodes, Rt ingiuno-axillary anastomosis, then Rt LE as follows: lateral thigh, medial to latera, 4 techniques at knee, ant and post lower leg, ankle, retro malleoli, dorsal foot/toes then retraced all steps.   Compression Bandaging to Rt LE as follows: Eucerin lotion, Medium TG soft foot to thigh, Small amt of artiflex to foot and ankle , then Comprilan soft 10 cm  ankle to knee, and 15 cm  comprilan knee to thigh. Toes 1-3 with mollelast.  Wrapped 6 cm to foot Roman sandal, then 8 cm ASH pattern. 12 cm ankle to knee, then 12 cm below knee to mid thigh, and additional 12 cm knee to groin, then last 12 cm ankle to knee, and 12 cm knee to groin.    01/24/2024 Pt measured MLD to Rt LE: Short neck, superficial and deep abdominals, Rt axillary nodes, Rt ingiuno-axillary anastomosis, then Rt LE as follows: lateral thigh, medial to latera, 4 techniques at knee, ant and post lower leg, ankle, retro malleoli, dorsal foot/toes then retraced all steps.   Compression Bandaging to Rt LE as follows: Eucerin lotion, Medium TG soft foot to thigh, Small amt of artiflex to foot and ankle , then Comprilan soft 10 cm ankle to knee, and 15 cm  comprilan knee to thigh. Toes 1-3 with mollelast.  Wrapped 6 cm to foot Roman sandal, then 8 cm ASH pattern. 12 cm ankle to knee, then 12 cm below knee to mid thigh, and additional 12 cm knee to groin, then last 12 cm ankle to knee, and 12 cm knee to groin.    01/20/2024 MLD to Rt LE: Short neck, superficial and deep abdominals, Rt axillary nodes, Rt ingiuno-axillary anastomosis, then Rt LE as follows: lateral thigh, medial to latera, 4 techniques at knee, ant and post lower leg, ankle, retro malleoli, dorsal foot/toes then retraced all steps.   Compression Bandaging to Rt LE as follows: Cocoa butter, Medium TG soft foot to thigh, Small amt of artiflex to foot and ankle and then at pop fossa, then Comprilan soft 10 cm ankle to knee, and 15 cm  comprilan knee to thigh. Did not wrap toes due to bandage on toes.  Wrapped 6 cm to foot Roman sandal, then 8 cm ASH pattern. 12 cm ankle to knee, then 12 cm below knee to mid thigh, and additional 12 cm knee to groin, then last 12 cm mid shin to groin.    01/15/2024  MLD to Rt LE: Short neck, superficial and deep abdominals, Rt axillary nodes, Rt ingiuno-axillary anastomosis, then Rt LE as follows:  lateral thigh, medial to latera, 4 techniques at knee, ant and post lower leg, ankle, retro malleoli, dorsal foot/toes then retraced all steps.   Compression Bandaging to Rt LE as follows: Cocoa butter, Medium TG soft foot to thigh, Small amt of artiflex to foot and ankle and then at pop fossa, then Comprilan soft 10 cm ankle to knee, and 15 cm  comprilan knee to thigh.  Wrapped 1st 3 toes with mollelast due to swelling, 6 cm to foot Roman sandal, then 8 cm ASH pattern. 12 cm  ankle to knee, then 12 cm below knee to mid thigh, and additional 12 cm knee to groin, then last 12 cm mid shin to groin. Checked capillary refill and was good Pt measured for compression garment today  01/13/2024 MLD to Rt LE: Short neck, superficial and deep abdominals, Rt axillary nodes, Rt ingiuno-axillary anastomosis, then Rt LE as follows: lateral thigh, medial to latera, 4 techniques at knee, ant and post lower leg, ankle, retro malleoli, dorsal foot/toes then retraced all steps.   Compression Bandaging to Rt LE as follows: Cocoa butter, Medium TG soft foot to thigh, Small amt of artiflex to foot and ankle and then at pop fossa, then Comprilan soft 10 cm ankle to knee, and 15 cm  comprilan knee to thigh.  Wrapped 1st 3 toes with mollelast due to swelling, 6 cm to foot Roman sandal, then 8 cm ASH pattern. 12 cm ankle to knee, then 12 cm below knee to mid thigh, and additional 12 cm knee to groin, then last 12 cm mid shin to groin. Checked capillary refill and was good  01/10/2024 Measured Right LE MLD to Rt LE: Short neck, superficial and deep abdominals, Rt axillary nodes, Rt ingiuno-axillary anastomosis, then Rt LE as follows: lateral thigh, medial to latera, 4 techniques at knee, ant and post lower leg, ankle, retro malleoli, dorsal foot/toes then retraced all steps.   Compression Bandaging to Rt LE as follows: Cocoa butter, Medium TG soft foot to thigh, Small amt of artiflex to foot and ankle and then at pop fossa, then  Comprilan soft 10 cm ankle to knee, and 15 cm  comprilan knee to thigh.  Wrapped 1st 3 toes with mollelast due to swelling, 6 cm to foot Roman sandal, then 8 cm ASH pattern. 12 cm ankle to knee, then 12 cm below knee to mid thigh, and additional 12 cm knee to groin, then last 12 cm mid shin to groin. Checked capillary refill and was good  01/08/2024 Signed pt up for measuring on Wednesday Pts leg reduced after Mondays wrap. MLD to Rt LE: Short neck, superficial and deep abdominals, Rt axillary nodes, Rt ingiuno-axillary anastomosis, then Rt LE as follows: lateral thigh, medial to latera, 4 techniques at knee, ant and post lower leg, ankle, retro malleoli, dorsal foot/toes then retraced all steps.   Compression Bandaging to Rt LE as follows: Cocoa butter, Medium TG soft foot to thigh, Small amt of artiflex to foot and ankle and then at pop fossa, then Comprilan soft 10 cm ankle to knee, and 15 cm  comprilan knee to thigh.  Wrapped 1st 3 toes with mollelast due to swelling, 6 cm to foot Roman sandal, then 8 cm ASH pattern. 12 cm ankle to knee, then 12 cm below knee to mid thigh, and additional 12 cm knee to groin, then last 12 cm mid shin to groin. Checked capillary refill and was good Measure next   01/06/2024 Pts leg much more swollen today after going a week without compression due to ablation MLD to Rt LE: Short neck, superficial and deep abdominals, Rt axillary nodes, Rt ingiuno-axillary anastomosis, then Rt LE as follows: lateral thigh, medial to latera, 4 techniques at knee, ant and post lower leg, ankle, retro malleoli, dorsal foot/toes then retraced all steps.   Compression Bandaging to Rt LE as follows: Cocoa butter, Medium TG soft foot to thigh, Small amt of artiflex to foot and ankle and then at pop fossa, then Comprilan soft 10 cm ankle to knee, and 15  cm  comprilan knee to thigh.  Wrapped 1st 3 toes with mollelast due to swelling, 6 cm to foot Roman sandal, then 8 cm ASH pattern. 12 cm ankle  to knee, then 12 cm below knee to mid thigh, and additional 12 cm knee to groin, then last 12 cm mid shin to groin. Checked capillary refill and was good   12/30/2023 After discussion with Eward Sharps PT supervisor it was determined we should not treat pt for the remainder of this week due to possibility of increased pressure causing arterial bleeding. Cancelled appts this week, and pt will resume next week.   12/24/2023 MLD to Rt LE: Short neck, superficial and deep abdominals, Rt axillary nodes, Rt ingiuno-axillary anastomosis, then Rt LE as follows: lateral thigh, medial to latera, 4 techniques at knee, ant and post lower leg, ankle, retro malleoli, dorsal foot/toes then retraced all steps.   Compression Bandaging to Rt LE as follows: Cocoa butter, Medium TG soft foot to thigh, Small amt of artiflex to foot and ankle and then at pop fossa, then Comprilan soft 10 cm ankle to knee, and 15 cm  comprilan knee to thigh.  Wrapped 1st 3 toes with mollelast due to swelling, 6 cm to foot Roman sandal, then 8 cm ASH pattern. 12 cm ankle to knee, then 12 cm below knee to mid thigh, and additional 12 cm knee to groin, then last 12 cm mid shin to groin. Checked capillary refill and was good   12/18/2023 Measured lower leg; increased throughout secondary to pt not wearing stocking for 5/10 days while away on her trip MLD to Rt LE: Short neck, superficial and deep abdominals, Rt axillary nodes, Rt ingiuno-axillary anastomosis, then Rt LE as follows: lateral thigh, medial to latera, 4 techniques at knee, ant and post lower leg, ankle, retro malleoli, dorsal foot/toes then retraced all steps.   Compression Bandaging to Rt LE as follows: Cocoa butter, Medium TG soft foot to thigh, Small amt of artiflex to foot and ankle and then at pop fossa, then Comprilan soft 10 cm ankle to knee, and 15 cm  comprilan knee to thigh.  Wrapped 1st 3 toes with mollelast due to swelling, 6 cm to foot Roman sandal, then 8 cm ASH pattern.  12 cm ankle to knee, then 12 cm below knee to mid thigh, and additional 12 cm knee to groin, then last 12 cm mid shin to groin. Checked capillary refill and was good  12/05/2023 Manual Therapy MLD to Rt LE: Short neck, superficial and deep abdominals, Rt axillary nodes, Rt ingiuno-axillary anastomosis, then Rt LE as follows: lateral thigh, medial to latera, 4 techniques at knee, ant and post lower leg, ankle, retro malleoli, dorsal foot/toes then retraced all steps.   Compression Bandaging to Rt LE as follows: Cocoa butter, Medium TG soft foot to thigh, Small amt of artiflex to foot and ankle and then at pop fossa, then Comprilan soft 10 cm ankle to knee, and 15 cm  comprilan knee to thigh.  Wrapped 1st 3 toes with mollelast due to swelling, 6 cm to foot Roman sandal, then 8 cm ASH pattern. 12 cm ankle to knee, then 12 cm below knee to mid thigh, and additional 12 cm knee to groin, then last 12 cm mid shin to groin. Checked capillary refill and was good.   12/02/2023 Pt measured with excellent results despite being in stocking today Manual Therapy MLD to Rt LE: Short neck, superficial and deep abdominals, Rt axillary nodes, Rt ingiuno-axillary  anastomosis, then Rt LE as follows: lateral thigh, medial to latera, 4 techniques at knee, ant and post lower leg, ankle, retro malleoli, dorsal foot/toes then retraced all steps.   Compression Bandaging to Rt LE as follows: Cocoa butter, Medium TG soft foot to thigh, Small amt of artiflex to foot and ankle and then at pop fossa, then Comprilan soft 10 cm ankle to knee, and 15 cm  comprilan knee to thigh.  Wrapped 1st 3 toes with mollelast due to swelling, 6 cm to foot Roman sandal, then 8 cm ASH pattern. 12 cm ankle to knee, then 12 cm below knee to mid thigh, and additional 12 cm knee to groin, then last 12 cm mid shin to groin. Checked capillary refill and was good.     11/29/2023 Manual Therapy MLD to Rt LE: Short neck, superficial and deep abdominals, Rt  axillary nodes, Rt ingiuno-axillary anastomosis, then Rt LE as follows: lateral thigh, medial to latera, 4 techniques at knee, ant and post lower leg, ankle, retro malleoli, dorsal foot/toes then retraced all steps.   Compression Bandaging to Rt LE as follows: Cocoa butter, Medium TG soft foot to thigh, Small amt of artiflex to foot and ankle and then at pop fossa, then Comprilan soft 10 cm ankle to knee, and 15 cm  comprilan knee to thigh.  Wrapped 1st 3 toes with mollelast due to swelling, 6 cm to foot Roman sandal, then 8 cm ASH pattern. 12 cm ankle to knee, then 12 cm below knee to mid thigh, and additional 12 cm knee to groin, then last 12 cm mid shin to groin. Checked capillary refill and was good.    11/20/2023   PATIENT EDUCATION:  Education details: POC, LOS, TREATMENT interventions; Twyla Mort name to call re: adjusting Savannah, Discussed compression stockings maintain, vs bandaging reduces and is very impt part of CDT in addition to MLD or pump. Person educated: Patient and Spouse Education method: Explanation Education comprehension: verbalized understanding  HOME EXERCISE PROGRAM:   ASSESSMENT:  CLINICAL IMPRESSION: Excellent reduction in Right leg since previous visit. Lower leg much softer and foot reduced nicely as well. Pt will go follow up to see if Nerissa([facility] [employer]) faxed the note to different number as she told pt. She would.  EVAL Patient is a 73 y.o. female who was seen today for physical therapy evaluation and treatment for progression of her right  LE lymphedema. . She has lymphostatic lymphedema with congestion and hyperkeratosis. She has been treated for Right LE lymphedema most recently 2 years ago and has a [place] that no longer fits properly due to 30 lb wt. Loss.  Her husband was previously independent in compression bandaging, and is willing to help his wife but will need review. We discussed importance of compression to reduce her leg as she questioned why her  leg continues to swell despite using a pump..She has compression stockings but has not been wearing them. She will benefit from skilled therapy to address deficits and return pt to a more fxl lifestyle.  OBJECTIVE IMPAIRMENTS: decreased activity tolerance, decreased knowledge of condition, increased edema, and postural dysfunction.   ACTIVITY LIMITATIONS: locomotion level  PARTICIPATION LIMITATIONS: no restrictions  PERSONAL FACTORS: chronic lymphedema, abdominal surgery for cervical cancer removal with 40 lymph nodes removed, right total knee in June 2022.  are also affecting patient's functional outcome.   REHAB POTENTIAL: Good  CLINICAL DECISION MAKING: Stable/uncomplicated  EVALUATION COMPLEXITY: Low   GOALS: Goals reviewed with patient? Yes  SHORT  TERM GOALS: Target date: 12/18/2023 // Pt/ husband will be able to do self compression bandaging  Baseline: Goal status: In Progress  2.  Pts husband will perform MLD or may use compression pump with MLD prn Baseline:  Goal status: Using compression pump after removing bandages 01/15/2024 3.  Pt will reduce at 10 cm prox to suprapatella by 1.5 cm Baseline:  Goal status: MET 01/10/2024 4.  Pt will reduce at 20 cm prox to floor by 2 cm Baseline:  Goal status: MET 01/10/2024  5.  Pt will call [place] Hill and will have garment refit due to her wt. loss Baseline:  Goal status: MET 01/10/2024  LONG TERM GOALS: Target date: 01/15/2024  Pt will have appropriate day  time garments to control lymphedema Baseline:  Goal status: In Progress, ordered 01/15/2024 but not received 2.  Pt reports she knows how to control her lymphedema at home with lymph drainage manually or with pump, compression and exercise  Baseline:  Goal status:In Progress  3.  Pt pt will reduce at 10 cm prox to patella by 3 cm to demonstrate improved swelling Baseline:  Goal status: MET 01/10/2024  4.  Pt will reduce at 20 cm prox to floor by 4.5 cmt o  demonstrate improved swelling Baseline:  Goal status: In Progress  PLAN:  PT FREQUENCY: 1-2x/week PT DURATION: 4-6 weeks prn  PLANNED INTERVENTIONS: [id number]- PT Re-evaluation, 97110-Therapeutic exercises, 97535- Self Care, [id number]- Manual therapy, 97760- Orthotic Initial, [id number]- Orthotic/Prosthetic subsequent, Manual lymph drainage, and Compression bandaging  PLAN FOR NEXT SESSION: Cont to instruct pts husband in wrapping,   MLD, measure weekly. Garments ordered today but held up because MD has not signed SWO.    Grayce JINNY Sheldon, PT 02/12/2024, 12:07 PM

## 2024-02-17 ENCOUNTER — Ambulatory Visit: Payer: Self-pay | Attending: Obstetrics & Gynecology

## 2024-02-17 DIAGNOSIS — Z90710 Acquired absence of both cervix and uterus: Secondary | ICD-10-CM | POA: Diagnosis present

## 2024-02-17 DIAGNOSIS — I890 Lymphedema, not elsewhere classified: Secondary | ICD-10-CM | POA: Diagnosis present

## 2024-02-17 DIAGNOSIS — C531 Malignant neoplasm of exocervix: Secondary | ICD-10-CM | POA: Insufficient documentation

## 2024-02-17 NOTE — Therapy (Signed)
 OUTPATIENT PHYSICAL THERAPY  LOWER EXTREMITY ONCOLOGY TREATMENT  Patient Name: Savannah Hill MRN: [id number] DOB:May 07, 1950, 73 y.o., female Today's Date: 02/17/2024  END OF SESSION:  PT End of Session - 02/17/24 1607     Visit Number 32    Number of Visits 37    Date for Recertification  02/26/24    PT Start Time 1504    PT Stop Time 1605    PT Time Calculation (min) 61 min    Activity Tolerance Patient tolerated treatment well    Behavior During Therapy [facility] for tasks assessed/performed           Past Medical History:  Diagnosis Date   Anemia    borderline   Atrial fibrillation (HCC)    a. s/p ablation on 10/05/2016   Cancer (HCC)    cervical/rad hysterectomy/bso wiht chemo for small cell ca   Dyspnea    Heart valve disorder    HLD (hyperlipidemia)    Hypertension    Joint pain    Lower extremity edema    Obesity    Palpitations    Past Surgical History:  Procedure Laterality Date   ABLATION OF DYSRHYTHMIC FOCUS  10/05/2016   ATRIAL FIBRILLATION ABLATION N/A 10/05/2016   Procedure: Atrial Fibrillation Ablation;  Surgeon: Inocencio Soyla Lunger, MD;  Location: [facility] INVASIVE CV LAB;  Service: Cardiovascular;  Laterality: N/A;   ATRIAL FIBRILLATION ABLATION N/A 12/27/2023   Procedure: ATRIAL FIBRILLATION ABLATION;  Surgeon: Inocencio Soyla Lunger, MD;  Location: [facility] INVASIVE CV LAB;  Service: Cardiovascular;  Laterality: N/A;   IR RADIOLOGY PERIPHERAL GUIDED IV START  09/28/2016   IR US  GUIDE VASC ACCESS RIGHT  09/28/2016   RADICAL ABDOMINAL HYSTERECTOMY  1986   with BSO   REPLACEMENT TOTAL KNEE Right 2022   TEE WITHOUT CARDIOVERSION N/A 06/07/2016   Procedure: TRANSESOPHAGEAL ECHOCARDIOGRAM (TEE);  Surgeon: Jerel Balding, MD;  Location: [facility] ENDOSCOPY;  Service: Cardiovascular;  Laterality: N/A;   Patient Active Problem List   Diagnosis Date Noted   History of CVA (cerebrovascular accident) 05/28/2021   Hyperlipidemia 06/17/2019   Class 1 obesity due to excess  calories with serious comorbidity and body mass index (BMI) of 32.0 to 32.9 in adult 12/05/2018   Postmenopausal 12/05/2018   Family history of diabetes mellitus in father 12/05/2018   Impaired fasting glucose 12/05/2018   Small cell carcinoma (HCC)- of the cervix 12/05/2018   Hip pain, bilateral 05/15/2018   Essential hypertension 01/29/2018   Chronic insomnia 01/29/2018   MDD (major depressive disorder), single episode 01/29/2018   Excessive drinking of alcohol  01/29/2018   Chronic anticoagulation 10/06/2016   Mitral valve disease    PAF (paroxysmal atrial fibrillation) (HCC) 07/01/2013   LAFB (left anterior fascicular block) 07/01/2013   Lymphedema 07/01/2013   h/o Cervical cancer 10/01/2011   H/O total hysterectomy 10/01/2011    PCP:   REFERRING PROVIDER: Ronal Pinal, MD  REFERRING DIAG: Right LE Lymphedema  THERAPY DIAG:  Malignant neoplasm of exocervix (HCC)  Lymphedema, not elsewhere classified  S/P total hysterectomy  ONSET DATE: April 2025 exacerbation  Rationale for Evaluation and Treatment: Rehabilitation  SUBJECTIVE:  SUBJECTIVE STATEMENT I left the bandages on until Friday morning and then I wore my old flat knit garment for the rest of the weekend. The spots they froze off are healing really well.   EVAL I have not been wearing garments but I have been using the pump every night. I am not using the Flexi Touch because It doesn't fit properly since I lost 30 lbs and I don't know how to adjust it. I have been using a basic pump and it does OK and my leg reduces but by the next day swelling returns. She has an Ablation for A Fib October 10. In 2 weeks we go to the [facility]  for 10 days. Pt has old compression stockings, a Flexi touch and another basic style pump, and a Sigvaris  night garment.   PERTINENT HISTORY:  radical hysterectomy in 1986 for small cell cervical endocrine cancer with 40 pelvic lymph nodes removed with chemo, no radiation. She developed some swelling after the procedure but has noticed and increase as she has gotten older and with some weight gain. She has a totat knee June 2022 and has had an increase in lymphedema since. She has an extremity pump from [employer] that she got in June 2022 and a Flexi touch that she received in 2023.[name] She has been treated here from 02/24/2021-06/2021 PAIN:  Are you having pain? No, occasionally if I move unexpectedly  PRECAUTIONS: Right LE lymphedema, prior CVA,Anemia, cervical CA, HLD, HTN, A-Fib, s/p TKA   RED FLAGS: None   WEIGHT BEARING RESTRICTIONS: No  FALLS:  Has patient fallen in last 6 months? No  LIVING ENVIRONMENT: Lives with: lives with their spouse Lives in: House/apartment Stairs: No;    OCCUPATION: Retired  LEISURE: read, cross word puzzles  PRIOR LEVEL OF FUNCTION: Independent  PATIENT GOALS: Decrease swelling right LE   OBJECTIVE: Note: Objective measures were completed at Evaluation unless otherwise noted.  COGNITION: Overall cognitive status: Within functional limits for tasks assessed   PALPATION: No pitting edema  OBSERVATIONS / OTHER ASSESSMENTS: significant right LE swelling, non pitting, but relatively soft  SENSATION: Light touch: Deficits     POSTURE: Round shoulders, forward head  LYMPHEDEMA ASSESSMENTS:   SURGERY TYPE/DATE: 03/19/1984  NUMBER OF LYMPH NODES REMOVED: 40  CHEMOTHERAPY: yes  RADIATION:  HORMONE TREATMENT: no  INFECTIONS:   LYMPHEDEMA ASSESSMENTS:   LOWER EXTREMITY LANDMARK RIGHT eval RIGHT 11/25/2023 RIGHT 12/02/2023 RIGHT 12/18/2023 RIGHT 01/10/2024 RIGHT 01/24/2024 RIGHT 02/10/2024  At groin 61.9        30 cm proximal to suprapatella         20 cm proximal to suprapatella 58.2  56.3  56 55.2   10 cm proximal to suprapatella 51.4   49.5  48 50..1   At midpatella / popliteal crease 38.1    37 36.7   30 cm proximal to floor at lateral plantar foot 41.9 40.5 38.8 39.7 40 40.5 43.3  20 cm proximal to floor at lateral plantar foot 39.2 39 35.2 37.6 36 37 40.8  10 cm proximal to floor at lateral plantar foot 32.7 31.4 30.0 32.3 29.9 30.4 35.7  Circumference of ankle/heel         5 cm proximal to 1st MTP joint 21.7    21.4 21.3   Across MTP joint 21.9      23.4  Around proximal great toe 8.0  7.5 7.7 7.8 7.8   (Blank rows = not tested)  LOWER EXTREMITY LANDMARK LEFT eval  At groin 59.5  30 cm proximal to suprapatella   20 cm proximal to suprapatella 56.5  10 cm proximal to suprapatella 45.2  At midpatella / popliteal crease 34.9  30 cm proximal to floor at lateral plantar foot 37.9  20 cm proximal to floor at lateral plantar foot 30.6  10 cm proximal to floor at lateral plantar foot 22.8  Circumference of ankle/heel   5 cm proximal to 1st MTP joint 20.9  Across MTP joint 20.5  Around proximal great toe 7.3  (Blank rows = not tested)  FUNCTIONAL TESTS:    GAIT:WFL   Outcome measure:LLIS: 22%                                                                                                                            TREATMENT DATE:   02/17/24: Manual Therapy MLD to Rt LE: Short neck, superficial and deep abdominals, Rt axillary nodes, Rt ingiuno-axillary anastomosis, then Rt LE as follows: lateral thigh, medial to lateral, 4 techniques at knee, ant and post lower leg, ankle, retro malleoli, dorsal foot/toes then retraced all steps spending more time at lower leg where most fluid present.  Compression Bandaging to Rt LE as follows: Cocoa butter, Medium TG soft foot to thigh, Small amt of artiflex to pop fossa, 15 cm  comprilan ankle to mid thigh. Toes 1-3 with mollelast then small amt of artiflex to foot/ankle. Short stretch compression bandages: 6 cm to foot Roman sandal, then 8 cm ASH pattern. 10 cm ankle to knee  in spiral, then 10 cm herringbone from ankle to knee, then 12 cm below knee to mid thigh with X at pop fossa, and additional 12 cm knee to near groin. Pt wanted to stop bandaging at this point. Advised her that if her thigh bandage begins slipping down leg she can add an additional 12 cm bandage from knee to thigh. She verbalized understanding.   02/12/2024  MLD to Rt LE: Short neck, superficial and deep abdominals, Rt axillary nodes, Rt ingiuno-axillary anastomosis, then Rt LE as follows: lateral thigh, medial to latera, 4 techniques at knee, ant and post lower leg, ankle, retro malleoli, dorsal foot/toes then retraced all steps.   Compression Bandaging to Rt LE as follows: Eucerin lotion, Medium TG soft foot to thigh, Small amt of artiflex to foot and ankle , then Comprilan soft 10 cm ankle to knee, and 15 cm  comprilan knee to thigh. Toes 1-3 with mollelast.  Wrapped 6 cm to foot Roman sandal, then 8 cm ASH pattern. 12 cm ankle to knee, then 12 cm below knee to mid thigh, and additional 12 cm knee to groin, then last 12 cm ankle to knee, and 12 cm knee to groin.    02/10/2024 Measured right lower leg; significantly more swollen MLD to Rt LE: Short neck, superficial and deep abdominals, Rt axillary nodes, Rt ingiuno-axillary anastomosis, then Rt LE as follows: lateral thigh, medial to latera, 4 techniques at knee,  ant and post lower leg, ankle, retro malleoli, dorsal foot/toes then retraced all steps.   Compression Bandaging to Rt LE as follows: Eucerin lotion, Medium TG soft foot to thigh, Small amt of artiflex to foot and ankle , then Comprilan soft 10 cm ankle to knee, and 15 cm  comprilan knee to thigh. Toes 1-3 with mollelast.  Wrapped 6 cm to foot Roman sandal, then 8 cm ASH pattern. 12 cm ankle to knee, then 12 cm below knee to mid thigh, and additional 12 cm knee to groin, then last 12 cm ankle to knee, and 12 cm knee to groin.   01/29/2024 MLD to Rt LE: Short neck, superficial and deep  abdominals, Rt axillary nodes, Rt ingiuno-axillary anastomosis, then Rt LE as follows: lateral thigh, medial to latera, 4 techniques at knee, ant and post lower leg, ankle, retro malleoli, dorsal foot/toes then retraced all steps.   Compression Bandaging to Rt LE as follows: Eucerin lotion, Medium TG soft foot to thigh, Small amt of artiflex to foot and ankle , then Comprilan soft 10 cm ankle to knee, and 15 cm  comprilan knee to thigh. Toes 1-3 with mollelast.  Wrapped 6 cm to foot Roman sandal, then 8 cm ASH pattern. 12 cm ankle to knee, then 12 cm below knee to mid thigh, and additional 12 cm knee to groin, then last 12 cm ankle to knee, and 12 cm knee to groin.    01/27/2024 MLD to Rt LE: Short neck, superficial and deep abdominals, Rt axillary nodes, Rt ingiuno-axillary anastomosis, then Rt LE as follows: lateral thigh, medial to latera, 4 techniques at knee, ant and post lower leg, ankle, retro malleoli, dorsal foot/toes then retraced all steps.   Compression Bandaging to Rt LE as follows: Eucerin lotion, Medium TG soft foot to thigh, Small amt of artiflex to foot and ankle , then Comprilan soft 10 cm ankle to knee, and 15 cm  comprilan knee to thigh. Toes 1-3 with mollelast.  Wrapped 6 cm to foot Roman sandal, then 8 cm ASH pattern. 12 cm ankle to knee, then 12 cm below knee to mid thigh, and additional 12 cm knee to groin, then last 12 cm ankle to knee, and 12 cm knee to groin.    01/24/2024 Pt measured MLD to Rt LE: Short neck, superficial and deep abdominals, Rt axillary nodes, Rt ingiuno-axillary anastomosis, then Rt LE as follows: lateral thigh, medial to latera, 4 techniques at knee, ant and post lower leg, ankle, retro malleoli, dorsal foot/toes then retraced all steps.   Compression Bandaging to Rt LE as follows: Eucerin lotion, Medium TG soft foot to thigh, Small amt of artiflex to foot and ankle , then Comprilan soft 10 cm ankle to knee, and 15 cm  comprilan knee to thigh. Toes 1-3 with  mollelast.  Wrapped 6 cm to foot Roman sandal, then 8 cm ASH pattern. 12 cm ankle to knee, then 12 cm below knee to mid thigh, and additional 12 cm knee to groin, then last 12 cm ankle to knee, and 12 cm knee to groin.    01/20/2024 MLD to Rt LE: Short neck, superficial and deep abdominals, Rt axillary nodes, Rt ingiuno-axillary anastomosis, then Rt LE as follows: lateral thigh, medial to latera, 4 techniques at knee, ant and post lower leg, ankle, retro malleoli, dorsal foot/toes then retraced all steps.   Compression Bandaging to Rt LE as follows: Cocoa butter, Medium TG soft foot to thigh, Small amt of artiflex to foot  and ankle and then at pop fossa, then Comprilan soft 10 cm ankle to knee, and 15 cm  comprilan knee to thigh. Did not wrap toes due to bandage on toes.  Wrapped 6 cm to foot Roman sandal, then 8 cm ASH pattern. 12 cm ankle to knee, then 12 cm below knee to mid thigh, and additional 12 cm knee to groin, then last 12 cm mid shin to groin.    01/15/2024  MLD to Rt LE: Short neck, superficial and deep abdominals, Rt axillary nodes, Rt ingiuno-axillary anastomosis, then Rt LE as follows: lateral thigh, medial to latera, 4 techniques at knee, ant and post lower leg, ankle, retro malleoli, dorsal foot/toes then retraced all steps.   Compression Bandaging to Rt LE as follows: Cocoa butter, Medium TG soft foot to thigh, Small amt of artiflex to foot and ankle and then at pop fossa, then Comprilan soft 10 cm ankle to knee, and 15 cm  comprilan knee to thigh.  Wrapped 1st 3 toes with mollelast due to swelling, 6 cm to foot Roman sandal, then 8 cm ASH pattern. 12 cm ankle to knee, then 12 cm below knee to mid thigh, and additional 12 cm knee to groin, then last 12 cm mid shin to groin. Checked capillary refill and was good Pt measured for compression garment today      PATIENT EDUCATION:  Education details: POC, LOS, TREATMENT interventions; Gave Leahs name to call re: adjusting Flexi,  Discussed compression stockings maintain, vs bandaging reduces and is very impt part of CDT in addition to MLD or pump. Person educated: Patient and Spouse Education method: Explanation Education comprehension: verbalized understanding  HOME EXERCISE PROGRAM:   ASSESSMENT:  CLINICAL IMPRESSION: Pt carried form for MD to sign to her doctor today and plans to follow up tomorrow to see if it was returned to [facility] custom fit for new garments. Today continued with CDT of Rt LE including compression bandaging. Pts lower leg feels very soft today indicating decreased lymphatic fluid and pt reports it feeling better since having spots frozen off leg a few weeks ago.   EVAL Patient is a 73 y.o. female who was seen today for physical therapy evaluation and treatment for progression of her right  LE lymphedema. . She has lymphostatic lymphedema with congestion and hyperkeratosis. She has been treated for Right LE lymphedema most recently 2 years ago and has a Flexi touch that no longer fits properly due to 30 lb wt. Loss.  Her husband was previously independent in compression bandaging, and is willing to help his wife but will need review. We discussed importance of compression to reduce her leg as she questioned why her leg continues to swell despite using a pump..She has compression stockings but has not been wearing them. She will benefit from skilled therapy to address deficits and return pt to a more fxl lifestyle.  OBJECTIVE IMPAIRMENTS: decreased activity tolerance, decreased knowledge of condition, increased edema, and postural dysfunction.   ACTIVITY LIMITATIONS: locomotion level  PARTICIPATION LIMITATIONS: no restrictions  PERSONAL FACTORS: chronic lymphedema, abdominal surgery for cervical cancer removal with 40 lymph nodes removed, right total knee in June 2022.  are also affecting patient's functional outcome.   REHAB POTENTIAL: Good  CLINICAL DECISION MAKING:  Stable/uncomplicated  EVALUATION COMPLEXITY: Low   GOALS: Goals reviewed with patient? Yes  SHORT TERM GOALS: Target date: 12/18/2023 // Pt/ husband will be able to do self compression bandaging  Baseline: Goal status: In Progress  2.  Pts  husband will perform MLD or may use compression pump with MLD prn Baseline:  Goal status: Using compression pump after removing bandages 01/15/2024 3.  Pt will reduce at 10 cm prox to suprapatella by 1.5 cm Baseline:  Goal status: MET 01/10/2024 4.  Pt will reduce at 20 cm prox to floor by 2 cm Baseline:  Goal status: MET 01/10/2024  5.  Pt will call flexi touch and will have garment refit due to her wt. loss Baseline:  Goal status: MET 01/10/2024  LONG TERM GOALS: Target date: 01/15/2024  Pt will have appropriate day  time garments to control lymphedema Baseline:  Goal status: In Progress, ordered 01/15/2024 but not received 2.  Pt reports she knows how to control her lymphedema at home with lymph drainage manually or with pump, compression and exercise  Baseline:  Goal status:In Progress  3.  Pt pt will reduce at 10 cm prox to patella by 3 cm to demonstrate improved swelling Baseline:  Goal status: MET 01/10/2024  4.  Pt will reduce at 20 cm prox to floor by 4.5 cmt o demonstrate improved swelling Baseline:  Goal status: In Progress  PLAN:  PT FREQUENCY: 1-2x/week PT DURATION: 4-6 weeks prn  PLANNED INTERVENTIONS: [id number]- PT Re-evaluation, 97110-Therapeutic exercises, 97535- Self Care, [id number]- Manual therapy, 97760- Orthotic Initial, [id number]- Orthotic/Prosthetic subsequent, Manual lymph drainage, and Compression bandaging  PLAN FOR NEXT SESSION: Cont to instruct pts husband in wrapping prn, MLD, measure weekly. Did pt get confirmation from MD office that they signed Medicare form for garments?    Aden Berwyn Caldron, PTA 02/17/2024, 4:23 PM

## 2024-02-19 ENCOUNTER — Ambulatory Visit

## 2024-02-19 DIAGNOSIS — C531 Malignant neoplasm of exocervix: Secondary | ICD-10-CM

## 2024-02-19 DIAGNOSIS — I890 Lymphedema, not elsewhere classified: Secondary | ICD-10-CM

## 2024-02-19 DIAGNOSIS — Z90710 Acquired absence of both cervix and uterus: Secondary | ICD-10-CM

## 2024-02-19 NOTE — Therapy (Signed)
 OUTPATIENT PHYSICAL THERAPY  LOWER EXTREMITY ONCOLOGY TREATMENT  Patient Name: Savannah Hill MRN: [id number] DOB:06-Sep-1950, 73 y.o., female Today's Date: 02/19/2024  END OF SESSION:  PT End of Session - 02/19/24 1408     Visit Number 33    Number of Visits 37    Date for Recertification  02/26/24    PT Start Time 1402    PT Stop Time 1502    PT Time Calculation (min) 60 min    Activity Tolerance Patient tolerated treatment well    Behavior During Therapy [facility] for tasks assessed/performed           Past Medical History:  Diagnosis Date   Anemia    borderline   Atrial fibrillation (HCC)    a. s/p ablation on 10/05/2016   Cancer (HCC)    cervical/rad hysterectomy/bso wiht chemo for small cell ca   Dyspnea    Heart valve disorder    HLD (hyperlipidemia)    Hypertension    Joint pain    Lower extremity edema    Obesity    Palpitations    Past Surgical History:  Procedure Laterality Date   ABLATION OF DYSRHYTHMIC FOCUS  10/05/2016   ATRIAL FIBRILLATION ABLATION N/A 10/05/2016   Procedure: Atrial Fibrillation Ablation;  Surgeon: Inocencio Soyla Lunger, MD;  Location: [facility] INVASIVE CV LAB;  Service: Cardiovascular;  Laterality: N/A;   ATRIAL FIBRILLATION ABLATION N/A 12/27/2023   Procedure: ATRIAL FIBRILLATION ABLATION;  Surgeon: Inocencio Soyla Lunger, MD;  Location: [facility] INVASIVE CV LAB;  Service: Cardiovascular;  Laterality: N/A;   IR RADIOLOGY PERIPHERAL GUIDED IV START  09/28/2016   IR US  GUIDE VASC ACCESS RIGHT  09/28/2016   RADICAL ABDOMINAL HYSTERECTOMY  1986   with BSO   REPLACEMENT TOTAL KNEE Right 2022   TEE WITHOUT CARDIOVERSION N/A 06/07/2016   Procedure: TRANSESOPHAGEAL ECHOCARDIOGRAM (TEE);  Surgeon: Jerel Balding, MD;  Location: [facility] ENDOSCOPY;  Service: Cardiovascular;  Laterality: N/A;   Patient Active Problem List   Diagnosis Date Noted   History of CVA (cerebrovascular accident) 05/28/2021   Hyperlipidemia 06/17/2019   Class 1 obesity due to excess  calories with serious comorbidity and body mass index (BMI) of 32.0 to 32.9 in adult 12/05/2018   Postmenopausal 12/05/2018   Family history of diabetes mellitus in father 12/05/2018   Impaired fasting glucose 12/05/2018   Small cell carcinoma (HCC)- of the cervix 12/05/2018   Hip pain, bilateral 05/15/2018   Essential hypertension 01/29/2018   Chronic insomnia 01/29/2018   MDD (major depressive disorder), single episode 01/29/2018   Excessive drinking of alcohol  01/29/2018   Chronic anticoagulation 10/06/2016   Mitral valve disease    PAF (paroxysmal atrial fibrillation) (HCC) 07/01/2013   LAFB (left anterior fascicular block) 07/01/2013   Lymphedema 07/01/2013   h/o Cervical cancer 10/01/2011   H/O total hysterectomy 10/01/2011    PCP:   REFERRING PROVIDER: Ronal Pinal, MD  REFERRING DIAG: Right LE Lymphedema  THERAPY DIAG:  Malignant neoplasm of exocervix (HCC)  Lymphedema, not elsewhere classified  S/P total hysterectomy  ONSET DATE: April 2025 exacerbation  Rationale for Evaluation and Treatment: Rehabilitation  SUBJECTIVE:  SUBJECTIVE STATEMENT I heard from the garment company and they got everything back from my doctor and the garments were officially ordered this morning!   EVAL I have not been wearing garments but I have been using the pump every night. I am not using the Flexi Touch because It doesn't fit properly since I lost 30 lbs and I don't know how to adjust it. I have been using a basic pump and it does OK and my leg reduces but by the next day swelling returns. She has an Ablation for A Fib October 10. In 2 weeks we go to the [facility]  for 10 days. Pt has old compression stockings, a Flexi touch and another basic style pump, and a Sigvaris night garment.   PERTINENT  HISTORY:  radical hysterectomy in 1986 for small cell cervical endocrine cancer with 40 pelvic lymph nodes removed with chemo, no radiation. She developed some swelling after the procedure but has noticed and increase as she has gotten older and with some weight gain. She has a totat knee June 2022 and has had an increase in lymphedema since. She has an extremity pump from [employer] that she got in June 2022 and a Flexi touch that she received in 2023.[name] She has been treated here from 02/24/2021-06/2021 PAIN:  Are you having pain? No, occasionally if I move unexpectedly  PRECAUTIONS: Right LE lymphedema, prior CVA,Anemia, cervical CA, HLD, HTN, A-Fib, s/p TKA   RED FLAGS: None   WEIGHT BEARING RESTRICTIONS: No  FALLS:  Has patient fallen in last 6 months? No  LIVING ENVIRONMENT: Lives with: lives with their spouse Lives in: House/apartment Stairs: No;    OCCUPATION: Retired  LEISURE: read, cross word puzzles  PRIOR LEVEL OF FUNCTION: Independent  PATIENT GOALS: Decrease swelling right LE   OBJECTIVE: Note: Objective measures were completed at Evaluation unless otherwise noted.  COGNITION: Overall cognitive status: Within functional limits for tasks assessed   PALPATION: No pitting edema  OBSERVATIONS / OTHER ASSESSMENTS: significant right LE swelling, non pitting, but relatively soft  SENSATION: Light touch: Deficits     POSTURE: Round shoulders, forward head  LYMPHEDEMA ASSESSMENTS:   SURGERY TYPE/DATE: 03/19/1984  NUMBER OF LYMPH NODES REMOVED: 40  CHEMOTHERAPY: yes  RADIATION:  HORMONE TREATMENT: no  INFECTIONS:   LYMPHEDEMA ASSESSMENTS:   LOWER EXTREMITY LANDMARK RIGHT eval RIGHT 11/25/2023 RIGHT 12/02/2023 RIGHT 12/18/2023 RIGHT 01/10/2024 RIGHT 01/24/2024 RIGHT 02/10/2024  At groin 61.9        30 cm proximal to suprapatella         20 cm proximal to suprapatella 58.2  56.3  56 55.2   10 cm proximal to suprapatella 51.4  49.5  48 50..1   At midpatella  / popliteal crease 38.1    37 36.7   30 cm proximal to floor at lateral plantar foot 41.9 40.5 38.8 39.7 40 40.5 43.3  20 cm proximal to floor at lateral plantar foot 39.2 39 35.2 37.6 36 37 40.8  10 cm proximal to floor at lateral plantar foot 32.7 31.4 30.0 32.3 29.9 30.4 35.7  Circumference of ankle/heel         5 cm proximal to 1st MTP joint 21.7    21.4 21.3   Across MTP joint 21.9      23.4  Around proximal great toe 8.0  7.5 7.7 7.8 7.8   (Blank rows = not tested)  LOWER EXTREMITY LANDMARK LEFT eval  At groin 59.5  30 cm proximal to suprapatella  20 cm proximal to suprapatella 56.5  10 cm proximal to suprapatella 45.2  At midpatella / popliteal crease 34.9  30 cm proximal to floor at lateral plantar foot 37.9  20 cm proximal to floor at lateral plantar foot 30.6  10 cm proximal to floor at lateral plantar foot 22.8  Circumference of ankle/heel   5 cm proximal to 1st MTP joint 20.9  Across MTP joint 20.5  Around proximal great toe 7.3  (Blank rows = not tested)  FUNCTIONAL TESTS:    GAIT:WFL   Outcome measure:LLIS: 22%                                                                                                                            TREATMENT DATE:   02/19/24: Manual Therapy MLD to Rt LE: Short neck, superficial and deep abdominals, Rt axillary nodes, Rt ingiuno-axillary anastomosis, then Rt LE as follows: lateral thigh, medial to lateral, 4 techniques at knee, ant and post lower leg, ankle, retro malleoli, dorsal foot/toes then retraced all steps spending more time at lower leg where most fluid present.  Compression Bandaging to Rt LE as follows: Cocoa butter, Medium TG soft foot to thigh, Small amt of artiflex to pop fossa, 15 cm  comprilan ankle to mid thigh. Toes 1-3 with mollelast then small amt of artiflex to foot/ankle. Short stretch compression bandages: 6 cm to foot Roman sandal, then 8 cm ASH pattern. 10 cm ankle to knee in spiral, then 10 cm herringbone  from ankle to knee, then 12 cm below knee to mid thigh with X at pop fossa, and additional 12 cm knee to near groin. Pt wanted to stop bandaging at this point.   02/17/24: Manual Therapy MLD to Rt LE: Short neck, superficial and deep abdominals, Rt axillary nodes, Rt ingiuno-axillary anastomosis, then Rt LE as follows: lateral thigh, medial to lateral, 4 techniques at knee, ant and post lower leg, ankle, retro malleoli, dorsal foot/toes then retraced all steps spending more time at lower leg where most fluid present.  Compression Bandaging to Rt LE as follows: Cocoa butter, Medium TG soft foot to thigh, Small amt of artiflex to pop fossa, 15 cm  comprilan ankle to mid thigh. Toes 1-3 with mollelast then small amt of artiflex to foot/ankle. Short stretch compression bandages: 6 cm to foot Roman sandal, then 8 cm ASH pattern. 10 cm ankle to knee in spiral, then 10 cm herringbone from ankle to knee, then 12 cm below knee to mid thigh with X at pop fossa, and additional 12 cm knee to near groin. Pt wanted to stop bandaging at this point. Advised her that if her thigh bandage begins slipping down leg she can add an additional 12 cm bandage from knee to thigh. She verbalized understanding.   02/12/2024  MLD to Rt LE: Short neck, superficial and deep abdominals, Rt axillary nodes, Rt ingiuno-axillary anastomosis, then Rt LE as follows: lateral thigh, medial to latera, 4 techniques  at knee, ant and post lower leg, ankle, retro malleoli, dorsal foot/toes then retraced all steps.   Compression Bandaging to Rt LE as follows: Eucerin lotion, Medium TG soft foot to thigh, Small amt of artiflex to foot and ankle , then Comprilan soft 10 cm ankle to knee, and 15 cm  comprilan knee to thigh. Toes 1-3 with mollelast.  Wrapped 6 cm to foot Roman sandal, then 8 cm ASH pattern. 12 cm ankle to knee, then 12 cm below knee to mid thigh, and additional 12 cm knee to groin, then last 12 cm ankle to knee, and 12 cm knee to  groin.    02/10/2024 Measured right lower leg; significantly more swollen MLD to Rt LE: Short neck, superficial and deep abdominals, Rt axillary nodes, Rt ingiuno-axillary anastomosis, then Rt LE as follows: lateral thigh, medial to latera, 4 techniques at knee, ant and post lower leg, ankle, retro malleoli, dorsal foot/toes then retraced all steps.   Compression Bandaging to Rt LE as follows: Eucerin lotion, Medium TG soft foot to thigh, Small amt of artiflex to foot and ankle , then Comprilan soft 10 cm ankle to knee, and 15 cm  comprilan knee to thigh. Toes 1-3 with mollelast.  Wrapped 6 cm to foot Roman sandal, then 8 cm ASH pattern. 12 cm ankle to knee, then 12 cm below knee to mid thigh, and additional 12 cm knee to groin, then last 12 cm ankle to knee, and 12 cm knee to groin.   01/29/2024 MLD to Rt LE: Short neck, superficial and deep abdominals, Rt axillary nodes, Rt ingiuno-axillary anastomosis, then Rt LE as follows: lateral thigh, medial to latera, 4 techniques at knee, ant and post lower leg, ankle, retro malleoli, dorsal foot/toes then retraced all steps.   Compression Bandaging to Rt LE as follows: Eucerin lotion, Medium TG soft foot to thigh, Small amt of artiflex to foot and ankle , then Comprilan soft 10 cm ankle to knee, and 15 cm  comprilan knee to thigh. Toes 1-3 with mollelast.  Wrapped 6 cm to foot Roman sandal, then 8 cm ASH pattern. 12 cm ankle to knee, then 12 cm below knee to mid thigh, and additional 12 cm knee to groin, then last 12 cm ankle to knee, and 12 cm knee to groin.    01/27/2024 MLD to Rt LE: Short neck, superficial and deep abdominals, Rt axillary nodes, Rt ingiuno-axillary anastomosis, then Rt LE as follows: lateral thigh, medial to latera, 4 techniques at knee, ant and post lower leg, ankle, retro malleoli, dorsal foot/toes then retraced all steps.   Compression Bandaging to Rt LE as follows: Eucerin lotion, Medium TG soft foot to thigh, Small amt of  artiflex to foot and ankle , then Comprilan soft 10 cm ankle to knee, and 15 cm  comprilan knee to thigh. Toes 1-3 with mollelast.  Wrapped 6 cm to foot Roman sandal, then 8 cm ASH pattern. 12 cm ankle to knee, then 12 cm below knee to mid thigh, and additional 12 cm knee to groin, then last 12 cm ankle to knee, and 12 cm knee to groin.    01/24/2024 Pt measured MLD to Rt LE: Short neck, superficial and deep abdominals, Rt axillary nodes, Rt ingiuno-axillary anastomosis, then Rt LE as follows: lateral thigh, medial to latera, 4 techniques at knee, ant and post lower leg, ankle, retro malleoli, dorsal foot/toes then retraced all steps.   Compression Bandaging to Rt LE as follows: Eucerin lotion, Medium TG soft  foot to thigh, Small amt of artiflex to foot and ankle , then Comprilan soft 10 cm ankle to knee, and 15 cm  comprilan knee to thigh. Toes 1-3 with mollelast.  Wrapped 6 cm to foot Roman sandal, then 8 cm ASH pattern. 12 cm ankle to knee, then 12 cm below knee to mid thigh, and additional 12 cm knee to groin, then last 12 cm ankle to knee, and 12 cm knee to groin.    01/20/2024 MLD to Rt LE: Short neck, superficial and deep abdominals, Rt axillary nodes, Rt ingiuno-axillary anastomosis, then Rt LE as follows: lateral thigh, medial to latera, 4 techniques at knee, ant and post lower leg, ankle, retro malleoli, dorsal foot/toes then retraced all steps.   Compression Bandaging to Rt LE as follows: Cocoa butter, Medium TG soft foot to thigh, Small amt of artiflex to foot and ankle and then at pop fossa, then Comprilan soft 10 cm ankle to knee, and 15 cm  comprilan knee to thigh. Did not wrap toes due to bandage on toes.  Wrapped 6 cm to foot Roman sandal, then 8 cm ASH pattern. 12 cm ankle to knee, then 12 cm below knee to mid thigh, and additional 12 cm knee to groin, then last 12 cm mid shin to groin.    01/15/2024  MLD to Rt LE: Short neck, superficial and deep abdominals, Rt axillary nodes, Rt  ingiuno-axillary anastomosis, then Rt LE as follows: lateral thigh, medial to latera, 4 techniques at knee, ant and post lower leg, ankle, retro malleoli, dorsal foot/toes then retraced all steps.   Compression Bandaging to Rt LE as follows: Cocoa butter, Medium TG soft foot to thigh, Small amt of artiflex to foot and ankle and then at pop fossa, then Comprilan soft 10 cm ankle to knee, and 15 cm  comprilan knee to thigh.  Wrapped 1st 3 toes with mollelast due to swelling, 6 cm to foot Roman sandal, then 8 cm ASH pattern. 12 cm ankle to knee, then 12 cm below knee to mid thigh, and additional 12 cm knee to groin, then last 12 cm mid shin to groin. Checked capillary refill and was good Pt measured for compression garment today      PATIENT EDUCATION:  Education details: POC, LOS, TREATMENT interventions; Gave Leahs name to call re: adjusting Flexi, Discussed compression stockings maintain, vs bandaging reduces and is very impt part of CDT in addition to MLD or pump. Person educated: Patient and Spouse Education method: Explanation Education comprehension: verbalized understanding  HOME EXERCISE PROGRAM:   ASSESSMENT:  CLINICAL IMPRESSION: Pt got word this morning that the garment company received everything from her doctor this morning and placed the order for her new compression garments. Today continued with CDT or Rt LE. Her lower leg was visibly reduced from last session and pt reports noticing improvements as well.   EVAL Patient is a 73 y.o. female who was seen today for physical therapy evaluation and treatment for progression of her right  LE lymphedema. . She has lymphostatic lymphedema with congestion and hyperkeratosis. She has been treated for Right LE lymphedema most recently 2 years ago and has a Flexi touch that no longer fits properly due to 30 lb wt. Loss.  Her husband was previously independent in compression bandaging, and is willing to help his wife but will need review. We  discussed importance of compression to reduce her leg as she questioned why her leg continues to swell despite using a pump..She has  compression stockings but has not been wearing them. She will benefit from skilled therapy to address deficits and return pt to a more fxl lifestyle.  OBJECTIVE IMPAIRMENTS: decreased activity tolerance, decreased knowledge of condition, increased edema, and postural dysfunction.   ACTIVITY LIMITATIONS: locomotion level  PARTICIPATION LIMITATIONS: no restrictions  PERSONAL FACTORS: chronic lymphedema, abdominal surgery for cervical cancer removal with 40 lymph nodes removed, right total knee in June 2022.  are also affecting patient's functional outcome.   REHAB POTENTIAL: Good  CLINICAL DECISION MAKING: Stable/uncomplicated  EVALUATION COMPLEXITY: Low   GOALS: Goals reviewed with patient? Yes  SHORT TERM GOALS: Target date: 12/18/2023 // Pt/ husband will be able to do self compression bandaging  Baseline: Goal status: In Progress  2.  Pts husband will perform MLD or may use compression pump with MLD prn Baseline:  Goal status: Using compression pump after removing bandages 01/15/2024 3.  Pt will reduce at 10 cm prox to suprapatella by 1.5 cm Baseline:  Goal status: MET 01/10/2024 4.  Pt will reduce at 20 cm prox to floor by 2 cm Baseline:  Goal status: MET 01/10/2024  5.  Pt will call flexi touch and will have garment refit due to her wt. loss Baseline:  Goal status: MET 01/10/2024  LONG TERM GOALS: Target date: 01/15/2024  Pt will have appropriate day  time garments to control lymphedema Baseline:  Goal status: In Progress, ordered 01/15/2024 but not received 2.  Pt reports she knows how to control her lymphedema at home with lymph drainage manually or with pump, compression and exercise  Baseline:  Goal status:In Progress  3.  Pt pt will reduce at 10 cm prox to patella by 3 cm to demonstrate improved swelling Baseline:  Goal status:  MET 01/10/2024  4.  Pt will reduce at 20 cm prox to floor by 4.5 cmt o demonstrate improved swelling Baseline:  Goal status: In Progress  PLAN:  PT FREQUENCY: 1-2x/week PT DURATION: 4-6 weeks prn  PLANNED INTERVENTIONS: 97164- PT Re-evaluation, 97110-Therapeutic exercises, 97535- Self Care, [id number]- Manual therapy, 97760- Orthotic Initial, [id number]- Orthotic/Prosthetic subsequent, Manual lymph drainage, and Compression bandaging  PLAN FOR NEXT SESSION: Cont CDT to Rt LE until new flat knit garments arrive.    Aden Berwyn Caldron, PTA 02/19/2024, 3:05 PM

## 2024-02-28 ENCOUNTER — Ambulatory Visit

## 2024-02-28 DIAGNOSIS — I890 Lymphedema, not elsewhere classified: Secondary | ICD-10-CM

## 2024-02-28 DIAGNOSIS — C531 Malignant neoplasm of exocervix: Secondary | ICD-10-CM | POA: Diagnosis not present

## 2024-02-28 DIAGNOSIS — Z90710 Acquired absence of both cervix and uterus: Secondary | ICD-10-CM

## 2024-02-28 NOTE — Therapy (Signed)
 OUTPATIENT PHYSICAL THERAPY  LOWER EXTREMITY ONCOLOGY TREATMENT  Patient Name: Savannah Hill MRN: [id number] DOB:Jun 04, 1950, 73 y.o., female Today's Date: 02/28/2024  END OF SESSION:  PT End of Session - 02/28/24 0756     Visit Number 34    Number of Visits 38    Date for Recertification  04/24/24    Authorization Type Medicare    PT Start Time 0800    PT Stop Time 0832    PT Time Calculation (min) 32 min    Activity Tolerance Patient tolerated treatment well    Behavior During Therapy [facility] for tasks assessed/performed            Past Medical History:  Diagnosis Date   Anemia    borderline   Atrial fibrillation (HCC)    a. s/p ablation on 10/05/2016   Cancer (HCC)    cervical/rad hysterectomy/bso wiht chemo for small cell ca   Dyspnea    Heart valve disorder    HLD (hyperlipidemia)    Hypertension    Joint pain    Lower extremity edema    Obesity    Palpitations    Past Surgical History:  Procedure Laterality Date   ABLATION OF DYSRHYTHMIC FOCUS  10/05/2016   ATRIAL FIBRILLATION ABLATION N/A 10/05/2016   Procedure: Atrial Fibrillation Ablation;  Surgeon: Inocencio Soyla Lunger, MD;  Location: [facility] INVASIVE CV LAB;  Service: Cardiovascular;  Laterality: N/A;   ATRIAL FIBRILLATION ABLATION N/A 12/27/2023   Procedure: ATRIAL FIBRILLATION ABLATION;  Surgeon: Inocencio Soyla Lunger, MD;  Location: [facility] INVASIVE CV LAB;  Service: Cardiovascular;  Laterality: N/A;   IR RADIOLOGY PERIPHERAL GUIDED IV START  09/28/2016   IR US  GUIDE VASC ACCESS RIGHT  09/28/2016   RADICAL ABDOMINAL HYSTERECTOMY  1986   with BSO   REPLACEMENT TOTAL KNEE Right 2022   TEE WITHOUT CARDIOVERSION N/A 06/07/2016   Procedure: TRANSESOPHAGEAL ECHOCARDIOGRAM (TEE);  Surgeon: Jerel Balding, MD;  Location: [facility] ENDOSCOPY;  Service: Cardiovascular;  Laterality: N/A;   Patient Active Problem List   Diagnosis Date Noted   History of CVA (cerebrovascular accident) 05/28/2021   Hyperlipidemia  06/17/2019   Class 1 obesity due to excess calories with serious comorbidity and body mass index (BMI) of 32.0 to 32.9 in adult 12/05/2018   Postmenopausal 12/05/2018   Family history of diabetes mellitus in father 12/05/2018   Impaired fasting glucose 12/05/2018   Small cell carcinoma (HCC)- of the cervix 12/05/2018   Hip pain, bilateral 05/15/2018   Essential hypertension 01/29/2018   Chronic insomnia 01/29/2018   MDD (major depressive disorder), single episode 01/29/2018   Excessive drinking of alcohol  01/29/2018   Chronic anticoagulation 10/06/2016   Mitral valve disease    PAF (paroxysmal atrial fibrillation) (HCC) 07/01/2013   LAFB (left anterior fascicular block) 07/01/2013   Lymphedema 07/01/2013   h/o Cervical cancer 10/01/2011   H/O total hysterectomy 10/01/2011    PCP:   REFERRING PROVIDER: Ronal Pinal, MD  REFERRING DIAG: Right LE Lymphedema  THERAPY DIAG:  Malignant neoplasm of exocervix (HCC)  Lymphedema, not elsewhere classified  S/P total hysterectomy  ONSET DATE: April 2025 exacerbation  Rationale for Evaluation and Treatment: Rehabilitation  SUBJECTIVE:  SUBJECTIVE STATEMENT I have the new compression stocking on. I like the thickness of it. We got home yesterday and my circaid profile night was there. I have been wearing my exostrong stocking for 7 days, but I did do my Flexi touch several times when we were not in [place]. We did a lot of walking8  EVAL I have not been wearing garments but I have been using the pump every night. I am not using the Flexi Touch because It doesn't fit properly since I lost 30 lbs and I don't know how to adjust it. I have been using a basic pump and it does OK and my leg reduces but by the next day swelling returns. She has an Ablation for A  Fib October 10. In 2 weeks we go to the [facility]  for 10 days. Pt has old compression stockings, a Flexi touch and another basic style pump, and a Sigvaris night garment.   PERTINENT HISTORY:  radical hysterectomy in 1986 for small cell cervical endocrine cancer with 40 pelvic lymph nodes removed with chemo, no radiation. She developed some swelling after the procedure but has noticed and increase as she has gotten older and with some weight gain. She has a totat knee June 2022 and has had an increase in lymphedema since. She has an extremity pump from [employer] that she got in June 2022 and a Flexi touch that she received in 2023.[name] She has been treated here from 02/24/2021-06/2021 PAIN:  Are you having pain? No, occasionally if I move unexpectedly  PRECAUTIONS: Right LE lymphedema, prior CVA,Anemia, cervical CA, HLD, HTN, A-Fib, s/p TKA   RED FLAGS: None   WEIGHT BEARING RESTRICTIONS: No  FALLS:  Has patient fallen in last 6 months? No  LIVING ENVIRONMENT: Lives with: lives with their spouse Lives in: House/apartment Stairs: No;    OCCUPATION: Retired  LEISURE: read, cross word puzzles  PRIOR LEVEL OF FUNCTION: Independent  PATIENT GOALS: Decrease swelling right LE   OBJECTIVE: Note: Objective measures were completed at Evaluation unless otherwise noted.  COGNITION: Overall cognitive status: Within functional limits for tasks assessed   PALPATION: No pitting edema  OBSERVATIONS / OTHER ASSESSMENTS: significant right LE swelling, non pitting, but relatively soft  SENSATION: Light touch: Deficits     POSTURE: Round shoulders, forward head  LYMPHEDEMA ASSESSMENTS:   SURGERY TYPE/DATE: 03/19/1984  NUMBER OF LYMPH NODES REMOVED: 40  CHEMOTHERAPY: yes  RADIATION:  HORMONE TREATMENT: no  INFECTIONS:   LYMPHEDEMA ASSESSMENTS:   LOWER EXTREMITY LANDMARK RIGHT eval RIGHT 11/25/2023 RIGHT 12/02/2023 RIGHT 12/18/2023 RIGHT 01/10/2024 RIGHT 01/24/2024  RIGHT 02/10/2024 RIGHT 02/28/2024  At groin 61.9         30 cm proximal to suprapatella          20 cm proximal to suprapatella 58.2  56.3  56 55.2  57.1  10 cm proximal to suprapatella 51.4  49.5  48 50..1  49.3  At midpatella / popliteal crease 38.1    37 36.7  36.7  30 cm proximal to floor at lateral plantar foot 41.9 40.5 38.8 39.7 40 40.5 43.3 40.8  20 cm proximal to floor at lateral plantar foot 39.2 39 35.2 37.6 36 37 40.8 39.1  10 cm proximal to floor at lateral plantar foot 32.7 31.4 30.0 32.3 29.9 30.4 35.7 31.3  Circumference of ankle/heel          5 cm proximal to 1st MTP joint 21.7    21.4 21.3  21.3  Across  MTP joint 21.9      23.4   Around proximal great toe 8.0  7.5 7.7 7.8 7.8  8  (Blank rows = not tested)  LOWER EXTREMITY LANDMARK LEFT eval  At groin 59.5  30 cm proximal to suprapatella   20 cm proximal to suprapatella 56.5  10 cm proximal to suprapatella 45.2  At midpatella / popliteal crease 34.9  30 cm proximal to floor at lateral plantar foot 37.9  20 cm proximal to floor at lateral plantar foot 30.6  10 cm proximal to floor at lateral plantar foot 22.8  Circumference of ankle/heel   5 cm proximal to 1st MTP joint 20.9  Across MTP joint 20.5  Around proximal great toe 7.3  (Blank rows = not tested)  FUNCTIONAL TESTS:    GAIT:WFL   Outcome measure:LLIS: 22%                                                                                                                            TREATMENT DATE:   02/28/2024 Pt measured for recert Pt instructed in proper donning of Circaid profile night garment and over sleeve. Pt was able to do independently with occasional VC's for technique Pt also able to don Mediven 550 custom stocking with good fit. Could be an inch or so higher but pt prefers where it comes to. Advised to try it, but if it comes down or she feels it needs to be redone for any reason it needs to be within 30 days Discussed use of Flexi touch  best if done daily if possible, but atleast 3-4 days per week or after she has been on her feet a lot or if leg appears swollen. Reminded her about maintenance phase with stocking in day, Circaid at night and Flexi touch and importance of each.   02/19/24: Manual Therapy MLD to Rt LE: Short neck, superficial and deep abdominals, Rt axillary nodes, Rt ingiuno-axillary anastomosis, then Rt LE as follows: lateral thigh, medial to lateral, 4 techniques at knee, ant and post lower leg, ankle, retro malleoli, dorsal foot/toes then retraced all steps spending more time at lower leg where most fluid present.  Compression Bandaging to Rt LE as follows: Cocoa butter, Medium TG soft foot to thigh, Small amt of artiflex to pop fossa, 15 cm  comprilan ankle to mid thigh. Toes 1-3 with mollelast then small amt of artiflex to foot/ankle. Short stretch compression bandages: 6 cm to foot Roman sandal, then 8 cm ASH pattern. 10 cm ankle to knee in spiral, then 10 cm herringbone from ankle to knee, then 12 cm below knee to mid thigh with X at pop fossa, and additional 12 cm knee to near groin. Pt wanted to stop bandaging at this point.   02/17/24: Manual Therapy MLD to Rt LE: Short neck, superficial and deep abdominals, Rt axillary nodes, Rt ingiuno-axillary anastomosis, then Rt LE as follows: lateral thigh, medial to lateral, 4 techniques at knee, ant  and post lower leg, ankle, retro malleoli, dorsal foot/toes then retraced all steps spending more time at lower leg where most fluid present.  Compression Bandaging to Rt LE as follows: Cocoa butter, Medium TG soft foot to thigh, Small amt of artiflex to pop fossa, 15 cm  comprilan ankle to mid thigh. Toes 1-3 with mollelast then small amt of artiflex to foot/ankle. Short stretch compression bandages: 6 cm to foot Roman sandal, then 8 cm ASH pattern. 10 cm ankle to knee in spiral, then 10 cm herringbone from ankle to knee, then 12 cm below knee to mid thigh with X at pop  fossa, and additional 12 cm knee to near groin. Pt wanted to stop bandaging at this point. Advised her that if her thigh bandage begins slipping down leg she can add an additional 12 cm bandage from knee to thigh. She verbalized understanding.   02/12/2024  MLD to Rt LE: Short neck, superficial and deep abdominals, Rt axillary nodes, Rt ingiuno-axillary anastomosis, then Rt LE as follows: lateral thigh, medial to latera, 4 techniques at knee, ant and post lower leg, ankle, retro malleoli, dorsal foot/toes then retraced all steps.   Compression Bandaging to Rt LE as follows: Eucerin lotion, Medium TG soft foot to thigh, Small amt of artiflex to foot and ankle , then Comprilan soft 10 cm ankle to knee, and 15 cm  comprilan knee to thigh. Toes 1-3 with mollelast.  Wrapped 6 cm to foot Roman sandal, then 8 cm ASH pattern. 12 cm ankle to knee, then 12 cm below knee to mid thigh, and additional 12 cm knee to groin, then last 12 cm ankle to knee, and 12 cm knee to groin.    02/10/2024 Measured right lower leg; significantly more swollen MLD to Rt LE: Short neck, superficial and deep abdominals, Rt axillary nodes, Rt ingiuno-axillary anastomosis, then Rt LE as follows: lateral thigh, medial to latera, 4 techniques at knee, ant and post lower leg, ankle, retro malleoli, dorsal foot/toes then retraced all steps.   Compression Bandaging to Rt LE as follows: Eucerin lotion, Medium TG soft foot to thigh, Small amt of artiflex to foot and ankle , then Comprilan soft 10 cm ankle to knee, and 15 cm  comprilan knee to thigh. Toes 1-3 with mollelast.  Wrapped 6 cm to foot Roman sandal, then 8 cm ASH pattern. 12 cm ankle to knee, then 12 cm below knee to mid thigh, and additional 12 cm knee to groin, then last 12 cm ankle to knee, and 12 cm knee to groin.   01/29/2024 MLD to Rt LE: Short neck, superficial and deep abdominals, Rt axillary nodes, Rt ingiuno-axillary anastomosis, then Rt LE as follows: lateral thigh,  medial to latera, 4 techniques at knee, ant and post lower leg, ankle, retro malleoli, dorsal foot/toes then retraced all steps.   Compression Bandaging to Rt LE as follows: Eucerin lotion, Medium TG soft foot to thigh, Small amt of artiflex to foot and ankle , then Comprilan soft 10 cm ankle to knee, and 15 cm  comprilan knee to thigh. Toes 1-3 with mollelast.  Wrapped 6 cm to foot Roman sandal, then 8 cm ASH pattern. 12 cm ankle to knee, then 12 cm below knee to mid thigh, and additional 12 cm knee to groin, then last 12 cm ankle to knee, and 12 cm knee to groin.    01/27/2024 MLD to Rt LE: Short neck, superficial and deep abdominals, Rt axillary nodes, Rt ingiuno-axillary anastomosis, then Rt  LE as follows: lateral thigh, medial to latera, 4 techniques at knee, ant and post lower leg, ankle, retro malleoli, dorsal foot/toes then retraced all steps.   Compression Bandaging to Rt LE as follows: Eucerin lotion, Medium TG soft foot to thigh, Small amt of artiflex to foot and ankle , then Comprilan soft 10 cm ankle to knee, and 15 cm  comprilan knee to thigh. Toes 1-3 with mollelast.  Wrapped 6 cm to foot Roman sandal, then 8 cm ASH pattern. 12 cm ankle to knee, then 12 cm below knee to mid thigh, and additional 12 cm knee to groin, then last 12 cm ankle to knee, and 12 cm knee to groin.    01/24/2024 Pt measured MLD to Rt LE: Short neck, superficial and deep abdominals, Rt axillary nodes, Rt ingiuno-axillary anastomosis, then Rt LE as follows: lateral thigh, medial to latera, 4 techniques at knee, ant and post lower leg, ankle, retro malleoli, dorsal foot/toes then retraced all steps.   Compression Bandaging to Rt LE as follows: Eucerin lotion, Medium TG soft foot to thigh, Small amt of artiflex to foot and ankle , then Comprilan soft 10 cm ankle to knee, and 15 cm  comprilan knee to thigh. Toes 1-3 with mollelast.  Wrapped 6 cm to foot Roman sandal, then 8 cm ASH pattern. 12 cm ankle to knee, then 12 cm  below knee to mid thigh, and additional 12 cm knee to groin, then last 12 cm ankle to knee, and 12 cm knee to groin.    01/20/2024 MLD to Rt LE: Short neck, superficial and deep abdominals, Rt axillary nodes, Rt ingiuno-axillary anastomosis, then Rt LE as follows: lateral thigh, medial to latera, 4 techniques at knee, ant and post lower leg, ankle, retro malleoli, dorsal foot/toes then retraced all steps.   Compression Bandaging to Rt LE as follows: Cocoa butter, Medium TG soft foot to thigh, Small amt of artiflex to foot and ankle and then at pop fossa, then Comprilan soft 10 cm ankle to knee, and 15 cm  comprilan knee to thigh. Did not wrap toes due to bandage on toes.  Wrapped 6 cm to foot Roman sandal, then 8 cm ASH pattern. 12 cm ankle to knee, then 12 cm below knee to mid thigh, and additional 12 cm knee to groin, then last 12 cm mid shin to groin.    01/15/2024  MLD to Rt LE: Short neck, superficial and deep abdominals, Rt axillary nodes, Rt ingiuno-axillary anastomosis, then Rt LE as follows: lateral thigh, medial to latera, 4 techniques at knee, ant and post lower leg, ankle, retro malleoli, dorsal foot/toes then retraced all steps.   Compression Bandaging to Rt LE as follows: Cocoa butter, Medium TG soft foot to thigh, Small amt of artiflex to foot and ankle and then at pop fossa, then Comprilan soft 10 cm ankle to knee, and 15 cm  comprilan knee to thigh.  Wrapped 1st 3 toes with mollelast due to swelling, 6 cm to foot Roman sandal, then 8 cm ASH pattern. 12 cm ankle to knee, then 12 cm below knee to mid thigh, and additional 12 cm knee to groin, then last 12 cm mid shin to groin. Checked capillary refill and was good Pt measured for compression garment today      PATIENT EDUCATION:  Education details: POC, LOS, TREATMENT interventions; Gave Leahs name to call re: adjusting Flexi, Discussed compression stockings maintain, vs bandaging reduces and is very impt part of CDT in addition to  MLD or pump. Person educated: Patient and Spouse Education method: Explanation Education comprehension: verbalized understanding  HOME EXERCISE PROGRAM:   ASSESSMENT:  CLINICAL IMPRESSION: Pt has received her new compression stocking and Circaid profile and is happy with the fit of each. Discussed maintenance phase of treatment and pt verbalizes understanding. Pts dates were extended in case she feels she needs to return in the next 2 months for reassessment for any reason. She has achieved all but 1 goal established, and is independent in self management.  EVAL Patient is a 73 y.o. female who was seen today for physical therapy evaluation and treatment for progression of her right  LE lymphedema. . She has lymphostatic lymphedema with congestion and hyperkeratosis. She has been treated for Right LE lymphedema most recently 2 years ago and has a Flexi touch that no longer fits properly due to 30 lb wt. Loss.  Her husband was previously independent in compression bandaging, and is willing to help his wife but will need review. We discussed importance of compression to reduce her leg as she questioned why her leg continues to swell despite using a pump..She has compression stockings but has not been wearing them. She will benefit from skilled therapy to address deficits and return pt to a more fxl lifestyle.  OBJECTIVE IMPAIRMENTS: decreased activity tolerance, decreased knowledge of condition, increased edema, and postural dysfunction.   ACTIVITY LIMITATIONS: locomotion level  PARTICIPATION LIMITATIONS: no restrictions  PERSONAL FACTORS: chronic lymphedema, abdominal surgery for cervical cancer removal with 40 lymph nodes removed, right total knee in June 2022.  are also affecting patient's functional outcome.   REHAB POTENTIAL: Good  CLINICAL DECISION MAKING: Stable/uncomplicated  EVALUATION COMPLEXITY: Low   GOALS: Goals reviewed with patient? Yes  SHORT TERM GOALS: Target date:  12/18/2023 // Pt/ husband will be able to do self compression bandaging  Baseline: Goal status: In Progress  2.  Pts husband will perform MLD or may use compression pump with MLD prn Baseline:  Goal status: MET Using compression pump after removing bandages 01/15/2024 3.  Pt will reduce at 10 cm prox to suprapatella by 1.5 cm Baseline:  Goal status: MET 01/10/2024 4.  Pt will reduce at 20 cm prox to floor by 2 cm Baseline:  Goal status: MET 01/10/2024  5.  Pt will call flexi touch and will have garment refit due to her wt. loss Baseline:  Goal status: MET 01/10/2024  LONG TERM GOALS: Target date: 01/15/2024  Pt will have appropriate day  time garments to control lymphedema Baseline:  Goal status:MET 02/28/2024  2.  Pt reports she knows how to control her lymphedema at home with lymph drainage manually or with pump, compression and exercise  Baseline:  Goal status:MET 02/28/2024  3.  Pt pt will reduce at 10 cm prox to patella by 3 cm to demonstrate improved swelling Baseline:  Goal status: MET 01/10/2024  4.  Pt will reduce at 20 cm prox to floor by 4.5 cmt o demonstrate improved swelling Baseline:  Goal status: In Progress  PLAN:  PT FREQUENCY: up to 4 visits in 8 weeks prn PT DURATION: 8 weeks prn  PLANNED INTERVENTIONS: 97164- PT Re-evaluation, 97110-Therapeutic exercises, 97535- Self Care, [id number]- Manual therapy, 97760- Orthotic Initial, [id number]- Orthotic/Prosthetic subsequent, Manual lymph drainage, and Compression bandaging  PLAN FOR NEXT SESSION; no further appts scheduled, but dates extended in case pt feels she needs reassessment . She knows to contact me with questions or concerns.   Grayce JINNY Sheldon, PT 02/28/2024, 8:51  AM

## 2024-03-08 ENCOUNTER — Other Ambulatory Visit: Payer: Self-pay | Admitting: Family Medicine

## 2024-03-08 DIAGNOSIS — I890 Lymphedema, not elsewhere classified: Secondary | ICD-10-CM

## 2024-03-17 ENCOUNTER — Other Ambulatory Visit: Payer: Self-pay | Admitting: Family Medicine

## 2024-03-17 DIAGNOSIS — I480 Paroxysmal atrial fibrillation: Secondary | ICD-10-CM

## 2024-03-17 DIAGNOSIS — F5105 Insomnia due to other mental disorder: Secondary | ICD-10-CM

## 2024-03-17 DIAGNOSIS — F5101 Primary insomnia: Secondary | ICD-10-CM

## 2024-03-17 MED ORDER — ALPRAZOLAM 0.5 MG PO TABS: 0.5000 mg | ORAL_TABLET | Freq: Every evening | ORAL | 0 refills | Status: AC | PRN

## 2024-03-17 MED ORDER — TRAZODONE HCL 100 MG PO TABS: 100.0000 mg | ORAL_TABLET | Freq: Every evening | ORAL | 1 refills | Status: AC | PRN

## 2024-03-17 NOTE — Telephone Encounter (Signed)
 Last office visit 01/23/2024 for MWV.  Last refilled 10/31/23 for #30 with no refills.  Next Appt: CPE 01/26/2025

## 2024-03-18 MED ORDER — DILTIAZEM HCL ER COATED BEADS 120 MG PO CP24: 120.0000 mg | ORAL_CAPSULE | Freq: Every day | ORAL | 1 refills | Status: DC

## 2024-03-18 MED ORDER — ELIQUIS 5 MG PO TABS: 5.0000 mg | ORAL_TABLET | Freq: Two times a day (BID) | ORAL | 1 refills | Status: AC

## 2024-03-18 NOTE — Telephone Encounter (Signed)
 Prescription refill request for Eliquis  received. Indication: A-Fib Last office visit: 08/26/23 Scr: 0.83 12/04/23 Care Everywhere Age: 73 Weight: 68.9 KG Pt has passed Parameters

## 2024-03-20 ENCOUNTER — Other Ambulatory Visit: Payer: Self-pay | Admitting: Family Medicine

## 2024-03-20 DIAGNOSIS — F320 Major depressive disorder, single episode, mild: Secondary | ICD-10-CM

## 2024-03-20 DIAGNOSIS — E785 Hyperlipidemia, unspecified: Secondary | ICD-10-CM

## 2024-03-20 DIAGNOSIS — I890 Lymphedema, not elsewhere classified: Secondary | ICD-10-CM

## 2024-03-20 MED ORDER — ROSUVASTATIN CALCIUM 5 MG PO TABS: 5.0000 mg | ORAL_TABLET | Freq: Every day | ORAL | 3 refills | Status: AC

## 2024-03-20 MED ORDER — FUROSEMIDE 20 MG PO TABS: 20.0000 mg | ORAL_TABLET | Freq: Every day | ORAL | 3 refills | Status: AC

## 2024-03-20 MED ORDER — CITALOPRAM HYDROBROMIDE 20 MG PO TABS: 20.0000 mg | ORAL_TABLET | Freq: Every day | ORAL | 3 refills | Status: AC

## 2024-03-30 ENCOUNTER — Ambulatory Visit (HOSPITAL_COMMUNITY)
Admission: RE | Admit: 2024-03-30 | Discharge: 2024-03-30 | Disposition: A | Discharge status: Home / Self Care | Source: Ambulatory Visit | Attending: Physician Assistant | Admitting: Physician Assistant

## 2024-03-30 VITALS — BP 122/66 | HR 64 | Ht 63.0 in | Wt 159.6 lb

## 2024-03-30 DIAGNOSIS — I4819 Other persistent atrial fibrillation: Secondary | ICD-10-CM | POA: Insufficient documentation

## 2024-03-30 DIAGNOSIS — D6869 Other thrombophilia: Secondary | ICD-10-CM | POA: Insufficient documentation

## 2024-03-30 DIAGNOSIS — I4891 Unspecified atrial fibrillation: Secondary | ICD-10-CM | POA: Insufficient documentation

## 2024-03-30 MED ORDER — DILTIAZEM HCL ER COATED BEADS 120 MG PO CP24: 120.0000 mg | ORAL_CAPSULE | Freq: Two times a day (BID) | ORAL | Status: AC

## 2024-03-30 NOTE — Progress Notes (Signed)
 "   Primary Care Physician: Avelina Greig BRAVO, MD Primary Cardiologist: Will Gladis Norton, MD Electrophysiologist: Will Gladis Norton, MD  Referring Physician: Dr Norton Lister HENNESY [place] is a 74 y.o. female with a history of HTN, CVA, CAD, HLD, cervical cancer, atrial fibrillation who presents for follow up in the [facility].  The patient had an afib ablation 10/05/16. She had done well until this year when she noted afib episodes multiple times per week. She underwent repeat afib ablation on 12/27/23. Patient is on Eliquis  for stroke prevention.    Patient returns for follow up for atrial fibrillation. She remains in SR today and feels well. No interim symptoms of afib. No bleeding issues on anticoagulation.   Today, she  denies symptoms of palpitations, chest pain, shortness of breath, orthopnea, PND, lower extremity edema, dizziness, presyncope, syncope, snoring, daytime somnolence, bleeding, or neurologic sequela. The patient is tolerating medications without difficulties and is otherwise without complaint today.    Atrial Fibrillation Risk Factors:  she does not have symptoms or diagnosis of sleep apnea. she does not have a history of rheumatic fever. she does have a history of alcohol  use.   Atrial Fibrillation Management history:  Previous antiarrhythmic drugs: none Previous cardioversions: none Previous ablations: 09/2016, 12/27/23 Anticoagulation history: Eliquis   ROS- All systems are reviewed and negative except as per the HPI above.  Past Medical History:  Diagnosis Date   Anemia    borderline   Atrial fibrillation (HCC)    a. s/p ablation on 10/05/2016   Cancer (HCC)    cervical/rad hysterectomy/bso wiht chemo for small cell ca   Dyspnea    Heart valve disorder    HLD (hyperlipidemia)    Hypertension    Joint pain    Lower extremity edema    Obesity    Palpitations     Current Outpatient Medications  Medication Sig Dispense  Refill   ALPRAZolam  (XANAX ) 0.5 MG tablet Take 1 tablet (0.5 mg total) by mouth at bedtime as needed for anxiety. 30 tablet 0   citalopram  (CELEXA ) 20 MG tablet Take 1 tablet (20 mg total) by mouth daily. 90 tablet 3   ELIQUIS  5 MG TABS tablet Take 1 tablet (5 mg total) by mouth 2 (two) times daily. 180 tablet 1   furosemide  (LASIX ) 20 MG tablet Take 1 tablet (20 mg total) by mouth daily. 90 tablet 3   nitrofurantoin , macrocrystal-monohydrate, (MACROBID ) 100 MG capsule Take 1 capsule (100 mg total) by mouth daily as needed. Take 1 po qd post coital 90 capsule 1   NONFORMULARY OR COMPOUNDED ITEM Qty (2) [employer] 350 Custom Thigh High stocking with open toe and 5 cm wide silicone band.   Qty 1: Circaid Profile Custom Thigh High Night Garment with Midnight Over sleeve  And foot pad. 1 each 0   rosuvastatin  (CRESTOR ) 5 MG tablet Take 1 tablet (5 mg total) by mouth daily. (Patient taking differently: Take 5 mg by mouth every other day.) 90 tablet 3   Semaglutide,0.25 or 0.5MG /DOS, (OZEMPIC, 0.25 OR 0.5 MG/DOSE,) 2 MG/1.5ML SOPN Inject 0.25 mg into the skin every 14 (fourteen) days. (Patient taking differently: Inject 2 mg into the skin every 14 (fourteen) days.)     traZODone  (DESYREL ) 100 MG tablet Take 1 tablet (100 mg total) by mouth at bedtime as needed for sleep. 90 tablet 1   diltiazem  (CARDIZEM  CD) 120 MG 24 hr capsule Take 1 capsule (120 mg total) by mouth  2 (two) times daily.     No current facility-administered medications for this encounter.    Physical Exam: BP 122/66   Pulse 64   Ht 5' 3 (1.6 m)   Wt 72.4 kg   LMP 03/19/1984   BMI 28.27 kg/m   GEN: Well nourished, well developed in no acute distress CARDIAC: Regular rate and rhythm, no murmurs, rubs, gallops RESPIRATORY:  Clear to auscultation without rales, wheezing or rhonchi  ABDOMEN: Soft, non-tender, non-distended EXTREMITIES:  No edema; No deformity    Wt Readings from Last 3 Encounters:  03/30/24 72.4 kg   01/24/24 68.9 kg  01/23/24 69.2 kg     EKG Interpretation Date/Time:  Monday March 30 2024 14:00:20 EST Ventricular Rate:  64 PR Interval:  294 QRS Duration:  118 QT Interval:  456 QTC Calculation: 470 R Axis:   -79  Text Interpretation: Sinus rhythm with 1st degree A-V block Left axis deviation Minimal voltage criteria for LVH, may be normal variant ( Cornell product ) Septal infarct , age undetermined Possible Lateral infarct (cited on or before 27-Dec-2023) Abnormal ECG When compared with ECG of 24-Jan-2024 13:46, No significant change was found Confirmed by Annisha Baar ([id number]) on 03/30/2024 2:16:22 PM    Echo 09/18/23 demonstrated   1. Left ventricular ejection fraction, by estimation, is 50 to 55%. The  left ventricle has low normal function. The left ventricle demonstrates  global hypokinesis. Left ventricular diastolic parameters are consistent  with Grade I diastolic dysfunction (impaired relaxation).   2. Right ventricular systolic function is normal. The right ventricular  size is normal.   3. Left atrial size was mildly dilated.   4. The mitral valve is normal in structure. Mild to moderate mitral valve  regurgitation. No evidence of mitral stenosis.   5. The aortic valve is tricuspid. Aortic valve regurgitation is not  visualized. No aortic stenosis is present.   6. The inferior vena cava is normal in size with greater than 50%  respiratory variability, suggesting right atrial pressure of 3 mmHg.    CHA2DS2-VASc Score = 6  The patient's score is based upon: CHF History: 0 HTN History: 1 Diabetes History: 0 Stroke History: 2 Vascular Disease History: 1 Age Score: 1 Gender Score: 1       ASSESSMENT AND PLAN: Persistent Atrial Fibrillation (ICD10:  I48.19) The patient's CHA2DS2-VASc score is 6, indicating a 9.7% annual risk of stroke.   S/p afib ablation 12/27/23 Patient appears to be maintaining SR Continue diltiazem  120 mg BID (she self resumed this  dose) Continue Eliquis  5 mg BID  Secondary Hypercoagulable State (ICD10:  D68.69) The patient is at significant risk for stroke/thromboembolism based upon her CHA2DS2-VASc Score of 6.  Continue Apixaban  (Eliquis ). No bleeding issues.   HTN Stable on current regimen  CAD CAC score 92 on CT No anginal symptoms   Follow up with Dr Inocencio in 6 months.     [facility] [address] [place], [place] [address] [phone] "

## 2024-04-01 ENCOUNTER — Encounter: Payer: Self-pay | Admitting: Family Medicine

## 2025-01-18 ENCOUNTER — Other Ambulatory Visit

## 2025-01-26 ENCOUNTER — Encounter: Admitting: Family Medicine
